# Patient Record
Sex: Female | Born: 1955 | Race: Black or African American
Health system: Southern US, Community
[De-identification: ages and names within clinical notes are randomized; demographics above are authoritative.]

## PROBLEM LIST (undated history)

## (undated) DIAGNOSIS — T148XXA Other injury of unspecified body region, initial encounter: Secondary | ICD-10-CM

## (undated) DIAGNOSIS — G4730 Sleep apnea, unspecified: Secondary | ICD-10-CM

## (undated) DIAGNOSIS — I879 Disorder of vein, unspecified: Secondary | ICD-10-CM

## (undated) DIAGNOSIS — G629 Polyneuropathy, unspecified: Secondary | ICD-10-CM

## (undated) DIAGNOSIS — K219 Gastro-esophageal reflux disease without esophagitis: Secondary | ICD-10-CM

## (undated) DIAGNOSIS — K759 Inflammatory liver disease, unspecified: Secondary | ICD-10-CM

## (undated) DIAGNOSIS — G5603 Carpal tunnel syndrome, bilateral upper limbs: Secondary | ICD-10-CM

## (undated) DIAGNOSIS — Z8619 Personal history of other infectious and parasitic diseases: Secondary | ICD-10-CM

## (undated) DIAGNOSIS — M545 Low back pain, unspecified: Secondary | ICD-10-CM

## (undated) DIAGNOSIS — C50912 Malignant neoplasm of unspecified site of left female breast: Secondary | ICD-10-CM

## (undated) DIAGNOSIS — R262 Difficulty in walking, not elsewhere classified: Secondary | ICD-10-CM

## (undated) DIAGNOSIS — I10 Essential (primary) hypertension: Secondary | ICD-10-CM

## (undated) DIAGNOSIS — H539 Unspecified visual disturbance: Secondary | ICD-10-CM

## (undated) DIAGNOSIS — M1990 Unspecified osteoarthritis, unspecified site: Secondary | ICD-10-CM

## (undated) DIAGNOSIS — J189 Pneumonia, unspecified organism: Secondary | ICD-10-CM

## (undated) DIAGNOSIS — F419 Anxiety disorder, unspecified: Secondary | ICD-10-CM

## (undated) DIAGNOSIS — J449 Chronic obstructive pulmonary disease, unspecified: Secondary | ICD-10-CM

## (undated) DIAGNOSIS — M542 Cervicalgia: Secondary | ICD-10-CM

## (undated) DIAGNOSIS — L309 Dermatitis, unspecified: Secondary | ICD-10-CM

## (undated) DIAGNOSIS — E785 Hyperlipidemia, unspecified: Secondary | ICD-10-CM

## (undated) HISTORY — DX: Sleep apnea, unspecified: G47.30

## (undated) HISTORY — DX: Low back pain, unspecified: M54.50

## (undated) HISTORY — DX: Chronic obstructive pulmonary disease, unspecified: J44.9

## (undated) HISTORY — DX: Inflammatory liver disease, unspecified: K75.9

## (undated) HISTORY — DX: Malignant neoplasm of unspecified site of left female breast: C50.912

## (undated) HISTORY — DX: Personal history of other infectious and parasitic diseases: Z86.19

## (undated) HISTORY — DX: Cervicalgia: M54.2

## (undated) HISTORY — PX: TONSILLECTOMY: SUR1361

## (undated) HISTORY — PX: INDUCED ABORTION: SHX677

## (undated) HISTORY — DX: Hyperlipidemia, unspecified: E78.5

---

## 1998-04-30 ENCOUNTER — Ambulatory Visit
Admit: 1998-04-30 | Disposition: A | Discharge status: Home / Self Care | Payer: Self-pay | Source: Ambulatory Visit | Admitting: Obstetrics & Gynecology

## 1998-06-11 ENCOUNTER — Emergency Department: Admit: 1998-06-11 | Payer: Self-pay | Admitting: Emergency Medicine

## 1999-03-30 ENCOUNTER — Emergency Department: Admit: 1999-03-30 | Payer: Self-pay | Admitting: Emergency Medicine

## 2001-04-25 ENCOUNTER — Emergency Department: Admit: 2001-04-25 | Payer: Self-pay | Source: Emergency Department | Admitting: Emergency Medicine

## 2005-09-18 HISTORY — PX: GANGLION CYST EXCISION: SHX1691

## 2008-10-21 ENCOUNTER — Emergency Department: Admit: 2008-10-21 | Payer: Self-pay | Source: Emergency Department | Admitting: Emergency Medicine

## 2009-07-26 ENCOUNTER — Emergency Department: Admit: 2009-07-26 | Payer: Self-pay | Source: Emergency Department | Admitting: Emergency Medicine

## 2011-12-07 ENCOUNTER — Emergency Department
Admission: EM | Admit: 2011-12-07 | Discharge: 2011-12-07 | Disposition: A | Discharge status: Home / Self Care | Payer: BLUE CROSS/BLUE SHIELD | Attending: Emergency Medicine | Admitting: Emergency Medicine

## 2011-12-07 ENCOUNTER — Emergency Department: Payer: BLUE CROSS/BLUE SHIELD

## 2011-12-07 ENCOUNTER — Emergency Department: Payer: BC Managed Care – HMO

## 2011-12-07 DIAGNOSIS — I10 Essential (primary) hypertension: Secondary | ICD-10-CM | POA: Insufficient documentation

## 2011-12-07 DIAGNOSIS — F17200 Nicotine dependence, unspecified, uncomplicated: Secondary | ICD-10-CM | POA: Insufficient documentation

## 2011-12-07 DIAGNOSIS — W19XXXA Unspecified fall, initial encounter: Secondary | ICD-10-CM | POA: Insufficient documentation

## 2011-12-07 DIAGNOSIS — IMO0002 Reserved for concepts with insufficient information to code with codable children: Secondary | ICD-10-CM | POA: Insufficient documentation

## 2011-12-07 HISTORY — DX: Essential (primary) hypertension: I10

## 2011-12-07 MED ORDER — HYDROCODONE-ACETAMINOPHEN 5-500 MG PO TABS
1.00 | ORAL_TABLET | ORAL | Status: DC | PRN
Start: 2011-12-07 — End: 2011-12-17

## 2011-12-07 MED ORDER — IBUPROFEN 600 MG PO TABS
600.00 mg | ORAL_TABLET | Freq: Once | ORAL | Status: AC
Start: 2011-12-07 — End: 2011-12-07
  Administered 2011-12-07: 600 mg via ORAL
  Filled 2011-12-07: qty 1

## 2011-12-07 NOTE — ED Notes (Signed)
56 y.o female pt with c/o right leg/calf pain that started immediately prior to arrival. PT was in a crisis injury prevention class and was standing sideways and with c/o "popping" sound with immediate pain to right calf.

## 2011-12-07 NOTE — ED Provider Notes (Signed)
History     Chief Complaint   Patient presents with   . Leg Injury     right     Patient is a 56 y.o. female presenting with leg pain. The history is provided by the patient.   Leg Pain   The incident occurred 1 to 2 hours ago. The incident occurred at work. 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 The pain is present in the right leg. The quality of the pain is described as sharp and throbbing. The pain is at a severity of 6/10. The pain has been fluctuating since onset. Associated symptoms include loss of motion. Pertinent negatives include no numbness, no muscle weakness, no loss of sensation and no tingling. The symptoms are aggravated by bearing weight and palpation. She has tried ice for the symptoms.       Past Medical History   Diagnosis Date   . Hypertensive disorder        History reviewed. No pertinent past surgical history.    History reviewed. No pertinent family history.    Current Facility-Administered Medications   Medication Dose Route Frequency Provider Last Rate Last Dose   . ibuprofen (ADVIL,MOTRIN) tablet 600 mg  600 mg Oral Once Rob Bunting, MD   600 mg at 12/07/11 1510     Current Outpatient Prescriptions   Medication Sig Dispense Refill   . Olmesartan-Amlodipine-HCTZ (TRIBENZOR) 40-5-12.5 MG TABS Take by mouth.       Marland Kitchen HYDROcodone-acetaminophen (VICODIN) 5-500 MG per tablet Take 1 tablet by mouth every 4 (four) hours as needed for Pain.  12 tablet  0       No Known Allergies    History   Substance Use Topics   . Smoking status: Current Everyday Smoker -- 0.5 packs/day   . Smokeless tobacco: Not on file   . Alcohol Use: No       Review of Systems   Neurological: Negative for tingling and numbness.   Constitutional: No fever or chills.  Eyes: no abnormality  ENT: No ear pain or sore throat.  Cardiovascular: No chest pain or palpitations.  Respiratory: No cough or shortness of breath.  GI: No vomiting or diarrhea.  Genitourinary:nl urine  output  Musculoskeletal:right calf  injury  Skin:No rash or skin lesions.  Neurologic:wnl  Psychiatric:      Physical Exam   BP 158/93  Pulse 124  Temp(Src) 98.4 F (36.9 C) (Oral)  Resp 20  Ht 1.626 m  Wt 104.327 kg  BMI 39.46 kg/m2  SpO2 100%    Physical Exam  Constitutional: Vital signs reviewed. Well appearing.  Head: Normocephalic, atraumatic  Eyes: No conjunctival injection. No discharge.EOMI  ENT: Mucous membranes moist,Ears and throat wnl  Neck: Normal range of motion. Non-tender.  Respiratory/Chest: Clear to auscultation. No respiratory distress.   Cardiovascular: Regular rate and rhythm. No murmur.   Abdomen: Soft and non-tender. No guarding. No masses or hepatosplenomegaly.  Genitourinary:   UpperExtremity:FROM,no abnormality noted  LowerExtremity: No edema. No cyanosis.decreased ROM TTP right posterior calf NVT CR intact  Neurological: No focal motor deficits by observation. Speech normal. Memory normal.  Skin: Warm and dry. No rash.  Lymphatic: No cervical lymphadenopathy.  Psychiatric: Normal affect. Normal concentration.  Back  No CVAT,no abnormalities,  Full range of motion  ED Course   Procedures    MDM           Rob Bunting, MD  12/07/11 1630

## 2011-12-07 NOTE — Discharge Instructions (Signed)
Muscle Strain, General    You have been diagnosed with a muscle strain.    Any muscle in the body can be strained. A strain is an injury to muscles where some of the muscle fibers are injured by being stretched or partially torn. This usually happens by overusing the muscle or performing an activity that the muscle is not used to doing.    Some of the symptoms of a strain include pain, muscle cramping, and soreness to the touch.    Often, the pain and stiffness in the muscle is worse the next day. This is much like what happens when a person begins exercising for the first time. After the exercise session, the person may feel pretty good, however the next day all of the exercised muscles feel stiff and sore.    The general treatment for a strain includes the following:   Resting the affected part.   Pain medication.   Muscle relaxant medications.   Warm compresses (such as a warm, moist towel).   Gentle stretching of the injured muscle.   And when tolerated, gentle massage of the affected area.    This injury is self-limited (it gets better on its own) and rarely requires specific treatment.    YOU SHOULD SEEK MEDICAL ATTENTION IMMEDIATELY, EITHER HERE OR AT THE NEAREST EMERGENCY DEPARTMENT, IF ANY OF THE FOLLOWING OCCURS:   Significant increase in swelling of the affected area.   Worsening pain instead of gradual improvement.   Redness of the skin over the affected area.   Inability to use the affected limb. Weakness or numbness of the limb.

## 2011-12-13 ENCOUNTER — Encounter (INDEPENDENT_AMBULATORY_CARE_PROVIDER_SITE_OTHER): Payer: Self-pay

## 2011-12-13 ENCOUNTER — Ambulatory Visit (INDEPENDENT_AMBULATORY_CARE_PROVIDER_SITE_OTHER): Payer: Worker's Comp, Other unspecified | Admitting: Nurse Practitioner

## 2011-12-13 VITALS — BP 106/82 | HR 102 | Temp 98.4°F | Resp 16 | Ht 60.0 in | Wt 180.0 lb

## 2011-12-13 DIAGNOSIS — S86919A Strain of unspecified muscle(s) and tendon(s) at lower leg level, unspecified leg, initial encounter: Secondary | ICD-10-CM

## 2011-12-13 DIAGNOSIS — IMO0002 Reserved for concepts with insufficient information to code with codable children: Secondary | ICD-10-CM
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
## 2011-12-13 NOTE — Patient Instructions (Signed)
MUSCLE STRAIN,EXTREMITY  A MUSCLE STRAIN is a stretching and tearing of muscle fibers. This causes pain, especially with motion of that muscle. There may also be some swelling and bruising.  HOME CARE:  1) Keep the injured area raised to reduce pain and swelling. This is especially important during the first 48 hours.  2) Make an ice pack (ice cubes in a plastic bag, wrapped in a towel) and apply for 20 minutes every 1-2 hours the first day. You should continue with ice packs 3-4 times a day for the second and third days. Unless otherwise instructed, on the fourth day you may begin hot soaks or hot packs (small towel soaked in hot water) 3-4 times a day while you gently exercise the involved area.  3) You may use acetaminophen (Tylenol) or ibuprofen (Motrin, Advil) to control pain, unless another medicine was prescribed. [ NOTE : If you have chronic liver or kidney disease or ever had a stomach ulcer or GI bleeding, talk with your doctor before using these medicines.]  4) For LEG STRAINS: If CRUTCHES have been recommended, do not bear full weight on the injured leg until you can do so without pain. You may return to sports when you are able to hop and run on the injured leg without pain.  FOLLOW UP with your doctor or this facility if you are not improving within the next five days.  GET PROMPT MEDICAL ATTENTION if any of the following occur:  -- Fingers or toes become swollen, cold, blue, numb or tingly  -- Pain or swelling increases   [address], [address], [place], [place] [address]. All rights reserved. This information is not intended as a substitute for professional medical care. Always follow your healthcare professional's instructions.

## 2012-04-09 ENCOUNTER — Emergency Department: Payer: Worker's Comp, Other unspecified

## 2012-04-09 ENCOUNTER — Emergency Department: Payer: BLUE CROSS/BLUE SHIELD

## 2012-04-09 ENCOUNTER — Emergency Department
Admission: EM | Admit: 2012-04-09 | Discharge: 2012-04-09 | Disposition: A | Discharge status: Home / Self Care | Payer: BLUE CROSS/BLUE SHIELD | Attending: Emergency Medicine | Admitting: Emergency Medicine

## 2012-04-09 DIAGNOSIS — S20219A Contusion of unspecified front wall of thorax, initial encounter: Secondary | ICD-10-CM | POA: Insufficient documentation

## 2012-04-09 DIAGNOSIS — I10 Essential (primary) hypertension: Secondary | ICD-10-CM | POA: Insufficient documentation

## 2012-04-09 MED ORDER — IBUPROFEN 600 MG PO TABS
600.00 mg | ORAL_TABLET | Freq: Four times a day (QID) | ORAL | Status: AC | PRN
Start: 2012-04-09 — End: 2012-04-19

## 2012-04-09 MED ORDER — OXYCODONE-ACETAMINOPHEN 5-325 MG PO TABS
ORAL_TABLET | ORAL | Status: AC
Start: 2012-04-09 — End: 2012-04-19

## 2012-04-09 MED ORDER — IBUPROFEN 600 MG PO TABS
600.00 mg | ORAL_TABLET | Freq: Once | ORAL | Status: AC
Start: 2012-04-09 — End: 2012-04-09
  Administered 2012-04-09: 600 mg via ORAL
  Filled 2012-04-09: qty 1

## 2012-04-09 MED ORDER — OXYCODONE-ACETAMINOPHEN 5-325 MG PO TABS
2.00 | ORAL_TABLET | Freq: Once | ORAL | Status: AC
Start: 2012-04-09 — End: 2012-04-09
  Administered 2012-04-09: 2 via ORAL

## 2012-04-09 NOTE — Discharge Instructions (Signed)
Chest Wall Contusion    You have been diagnosed with a chest wall contusion (bruise).    A chest wall contusion is a bruise to the muscles between the ribs. A contusion is another word for a bruise. This condition is painful because every breath moves the injured area. This condition is not dangerous by itself, but once in a while complications like pneumonia or a collapsed lung occur. Your pain should gradually decrease over this time.    Do not bind or tape your ribs. Although binding or taping them may decrease the pain, it also increases the chance you will develop pneumonia.    Cough and deep breathe at least 10 times an hour while you are awake. Supporting the injured area with a pillow or your hand decreases the pain. Use pain medications as prescribed to control the pain so you can breathe normally and do your coughing and deep-breathing exercises. Doing these exercises will help prevent pneumonia.    YOU SHOULD SEEK MEDICAL ATTENTION IMMEDIATELY, EITHER HERE OR AT THE NEAREST EMERGENCY DEPARTMENT, IF ANY OF THE FOLLOWING OCCURS:   Shortness of breath, such as difficulty breathing or wheezing.   Increasing pain not controlled by your pain medications.   Signs of pneumonia such as fever, cough (especially a cough that produces yellow-green mucus).   No improvement over the next few days.      Take motrin for pain, percocet for severe pain. You need to keep taking deep breaths to prevent a pneumonia. Follow up with your doctor. Return to ER for any worsening symptoms.
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
## 2012-04-09 NOTE — ED Notes (Signed)
Pt rates pain 6/10 at present.  Pt requested percocet prior to discharge.  Teaching done r/t dx, pain management, TCDB and follow up.
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
## 2012-04-19 ENCOUNTER — Emergency Department: Payer: Worker's Comp, Other unspecified

## 2012-04-19 ENCOUNTER — Emergency Department
Admission: EM | Admit: 2012-04-19 | Discharge: 2012-04-19 | Disposition: A | Discharge status: Home / Self Care | Payer: Worker's Comp, Other unspecified | Attending: Emergency Medicine | Admitting: Emergency Medicine

## 2012-04-19 DIAGNOSIS — I10 Essential (primary) hypertension: Secondary | ICD-10-CM | POA: Insufficient documentation

## 2012-04-19 DIAGNOSIS — F17200 Nicotine dependence, unspecified, uncomplicated: Secondary | ICD-10-CM | POA: Insufficient documentation

## 2012-04-19 DIAGNOSIS — J329 Chronic sinusitis, unspecified: Secondary | ICD-10-CM | POA: Insufficient documentation

## 2012-04-19 MED ORDER — IBUPROFEN 400 MG PO TABS
800.00 mg | ORAL_TABLET | Freq: Once | ORAL | Status: AC
Start: 2012-04-20 — End: 2012-04-19
  Administered 2012-04-19: 800 mg via ORAL
  Filled 2012-04-19: qty 2

## 2012-04-19 MED ORDER — AMOXICILLIN-POT CLAVULANATE 875-125 MG PO TABS
1.00 | ORAL_TABLET | Freq: Once | ORAL | Status: AC
Start: 2012-04-20 — End: 2012-04-19
  Administered 2012-04-19: 1 via ORAL
  Filled 2012-04-19: qty 1

## 2012-04-19 MED ORDER — IBUPROFEN 800 MG PO TABS
800.00 mg | ORAL_TABLET | Freq: Three times a day (TID) | ORAL | Status: AC | PRN
Start: 2012-04-19 — End: 2012-04-29

## 2012-04-19 MED ORDER — AMOXICILLIN-POT CLAVULANATE 875-125 MG PO TABS
1.00 | ORAL_TABLET | Freq: Two times a day (BID) | ORAL | Status: AC
Start: 2012-04-19 — End: 2012-04-26

## 2012-04-19 NOTE — Discharge Instructions (Signed)
Sinusitis    You have been seen for a sinus infection.    Sinus infections are common. They often happen after people have a virus or a common cold.    Symptoms are: Pain in the face, green or yellow mucous from the nose, fevers and chills. There may also be a feeling of fullness or pressure in the face.    Decongestants may help. This helps the sinuses drain. Sometimes a steroid spray is used.    You may need antibiotics for the infection. Not all cases of sinusitis need antibiotics. Viruses cause most sinusitis. Antibiotics do not work on viruses. Sometimes sinusitis and be caused by bacteria. These cases usually get better with medicine to help the sinuses drain. Antibiotics should be given only to patients who have symptoms for more than 2 weeks and if they have fevers, severe sinus pain and pus mixed in with the mucus.    YOU SHOULD SEEK MEDICAL ATTENTION IMMEDIATELY, EITHER HERE OR AT THE NEAREST EMERGENCY DEPARTMENT, IF ANY OF THE FOLLOWING OCCURS:   You do not get better with treatment.   You have severe headaches and high fevers or any confusion.   You have worse pain in the face. Your face gets more swollen.

## 2012-04-19 NOTE — ED Notes (Signed)
Pt arrived ambulatory with c/o sinus pressure, nasal congestion, cough and congestion.  Onset of symptoms one week ago.  Pt c/o pressure and pain with palpation of face.

## 2012-04-20 NOTE — ED Provider Notes (Signed)
Physician/Midlevel provider first contact with patient: 04/19/12 2309         History     Chief Complaint   Patient presents with   . Facial Pain   . Nasal Congestion   . Cough     HPI  Pt came to ed for having sinus pressure, nasal d/c, HA, cough and congestion x1 week.feels pressure in her sinuses.  No f/c, no sob, no chest apin  Past Medical History   Diagnosis Date   . Hypertensive disorder        History reviewed. No pertinent past surgical history.    Family History   Problem Relation Age of Onset   . Hypertension Mother    . Hypertension Father        Social  History   Substance Use Topics   . Smoking status: Current Everyday Smoker -- 0.5 packs/day for 30 years   . Smokeless tobacco: Not on file   . Alcohol Use: No       .     No Known Allergies    Current/Home Medications    IBUPROFEN (ADVIL,MOTRIN) 600 MG TABLET    Take 1 tablet (600 mg total) by mouth every 6 (six) hours as needed for Pain or Fever.    METOPROLOL (LOPRESSOR) 50 MG TABLET    Take 50 mg by mouth daily.    OLMESARTAN-AMLODIPINE-HCTZ (TRIBENZOR) 40-5-12.5 MG TABS    Take by mouth.    OXYCODONE-ACETAMINOPHEN (PERCOCET) 5-325 MG PER TABLET    1-2 tablets by mouth every 4-6 hours as needed for pain;  Do not drive or operate machinery while taking this medicine        Review of Systems    CONSTITUTIONAL: No weight loss, fever, chills, weakness or fatigue.   HEENT: Eyes: No visual loss, blurred vision, double vision or yellow sclerae. Ears, Nose, Throat: No hearing loss, sneezing, +congestion,+ runny nose no sore throat.   SKIN: No rash or itching.   NEUROLOGICAL: No headache, dizziness, syncope, paralysis, ataxia, numbness or tingling in the extremities. No change in bowel or bladder control.   MUSCULOSKELETAL: No muscle, back pain, joint pain or stiffness.   HEMATOLOGIC: No anemia, bleeding or bruising.   LYMPHATICS: No enlarged nodes. No history of splenectomy.   PSYCHIATRIC: No history of depression or anxiety.   ALLERGIES: No history of  asthma, hives, eczema or rhinitis  All other related systems reviewed and are otherwise negative      Physical Exam    BP 160/90  Pulse 67  Temp(Src) 98.9 F (37.2 C) (Oral)  Resp 20  Ht 1.524 m  Wt 81.647 kg  BMI 35.15 kg/m2  SpO2 96%    Physical Exam  GENERAL: Well developed, well nourished, alert and cooperative, and appears to be in no acute distress.  HEAD: normocephalic. Atraumatic  EENT: PERRL, EOMI. Vision is grossly intact. External auditory canals and tympanic membranes clear, hearing grossly intact. No nasal discharge. Oral cavity and pharynx normal. No inflammation, swelling, exudate, or lesions. + maxillary and frontal sinuses ttp  NECK: Neck supple, non-tender without lymphadenopathy, masses or thyromegaly.   rebound.  No masses.  UPPER EXTREMITIES: Normal ROM in all extremities. No joint erythema or tenderness. Normal gait. No cyanosis. No significant deformity or joint abnormality. No edema.   LOWER EXTREMITY: Examination of both feet reveals all toes to be normal in size and symmetry, normal range of motion, normal sensation with distal capillary filling of less than 2 seconds without tenderness,  swelling, discoloration, nodules, weakness or deformity; examination of both ankles, knees, legs, and hips reveals normal range of motion, normal sensation without tenderness, swelling, discoloration, crepitus, weakness or deformity.  BACK: No midline tenderness  NEUROLOGICAL: CN II-XII intact. Strength and sensation symmetric and intact throughout. Reflexes 2+ throughout. GCS 15  SKIN: Skin normal color, texture and turgor with no lesions or eruptions.  PSYCHIATRIC: The mental examination revealed the patient was oriented to person, place, and time.   MDM and ED Course     ED Medication Orders      Start     Status Ordering Provider    04/20/12 0000   amoxicillin-clavulanate (AUGMENTIN) 875-125 MG per tablet 1 tablet   Once      Route: Oral  Ordered Dose: 1 tablet         Last MAR action:  Given  Adryana Mogensen AHMADIAN    04/20/12 0000   ibuprofen (ADVIL,MOTRIN) tablet 800 mg   Once      Route: Oral  Ordered Dose: 800 mg         Last MAR action:  Given Marybelle Killings                 MDM    Medical Decision Making      Presumptive Diagnosis:   1. Sinusitis          Treatment Plan: home, see patient instructions for treatment and plan    I reviewed the vital signs, nursing notes, past medical history, past surgical history, family history and social history.  Marybelle Killings, MD    Vital Signs - BP 160/90  Pulse 67  Temp(Src) 98.9 F (37.2 C) (Oral)  Resp 20  Ht 1.524 m  Wt 81.647 kg  BMI 35.15 kg/m2  SpO2 96%    Pulse Oximetry Analysis -  Normal    Differential Diagnosis (not completely inclusive): URI    Laboratory results reviewed by EDP: Yes  R    Results     ** No Results found for the last 24 hours. **          Radiology Results (24 Hour)     ** No Results found for the last 24 hours. **            VS was reviewed prior to dispo  Pt was re-evaluated prior to dispo      Results of work up was discussed with the Pt. Pt was given instruction to have follow up with PMD/Specialist or Return to ED for any worsening. Understood and agreed.      Vital Signs: Reviewed the patient's vital signs.       Nursing Notes: Reviewed and utilized available nursing notes.      Medical Records Reviewed: Reviewed available past medical records.    Procedures    Clinical Impression & Disposition     Clinical Impression  Final diagnoses:   Sinusitis        ED Disposition     Discharge Beyonce Sawatzky Knittle discharge to home/self care.    Condition at discharge: Stable             New Prescriptions    AMOXICILLIN-CLAVULANATE (AUGMENTIN) 875-125 MG PER TABLET    Take 1 tablet by mouth 2 (two) times daily.    IBUPROFEN (ADVIL,MOTRIN) 800 MG TABLET    Take 1 tablet (800 mg total) by mouth every 8 (eight) hours as needed for Pain or Fever.  Marybelle Killings, MD  04/20/12 [phone]

## 2012-05-11 ENCOUNTER — Emergency Department
Admission: EM | Admit: 2012-05-11 | Discharge: 2012-05-11 | Disposition: A | Discharge status: Home / Self Care | Payer: Worker's Comp, Other unspecified | Attending: Emergency Medicine | Admitting: Emergency Medicine

## 2012-05-11 ENCOUNTER — Emergency Department: Payer: Worker's Comp, Other unspecified

## 2012-05-11 DIAGNOSIS — F17200 Nicotine dependence, unspecified, uncomplicated: Secondary | ICD-10-CM | POA: Insufficient documentation

## 2012-05-11 DIAGNOSIS — L259 Unspecified contact dermatitis, unspecified cause: Secondary | ICD-10-CM | POA: Insufficient documentation

## 2012-05-11 MED ORDER — DOXYCYCLINE HYCLATE 100 MG PO TABS
100.00 mg | ORAL_TABLET | Freq: Two times a day (BID) | ORAL | Status: AC
Start: 2012-05-11 — End: 2012-05-25

## 2012-05-11 MED ORDER — PREDNISONE 20 MG PO TABS
60.00 mg | ORAL_TABLET | Freq: Once | ORAL | Status: AC
Start: 2012-05-11 — End: 2012-05-11
  Administered 2012-05-11: 60 mg via ORAL
  Filled 2012-05-11: qty 3

## 2012-05-11 MED ORDER — PREDNISONE 20 MG PO TABS
40.00 mg | ORAL_TABLET | Freq: Every day | ORAL | Status: AC
Start: 2012-05-11 — End: 2012-05-16

## 2012-05-11 MED ORDER — DOXYCYCLINE HYCLATE 100 MG PO TABS
100.00 mg | ORAL_TABLET | Freq: Once | ORAL | Status: AC
Start: 2012-05-11 — End: 2012-05-11
  Administered 2012-05-11: 100 mg via ORAL
  Filled 2012-05-11: qty 1

## 2012-05-11 NOTE — ED Provider Notes (Signed)
None        History     Chief Complaint   Patient presents with   . Rash     HPI Comments: 56 yo pw pruritic rash left arm x 2 days.  + exposure to weeds earlier in week.  No relief with topical steroids.    Patient is a 56 y.o. female presenting with rash. The history is provided by the patient.   Rash   This is a new problem. The current episode started 2 days ago. The problem has not changed since onset.      Past Medical History   Diagnosis Date   . Hypertensive disorder        History reviewed. No pertinent past surgical history.    Family History   Problem Relation Age of Onset   . Hypertension Mother    . Hypertension Father        Social  History   Substance Use Topics   . Smoking status: Current Everyday Smoker -- 0.5 packs/day for 30 years   . Smokeless tobacco: Not on file   . Alcohol Use: No       .     No Known Allergies    Current/Home Medications    METOPROLOL (LOPRESSOR) 50 MG TABLET    Take 50 mg by mouth daily.    OLMESARTAN-AMLODIPINE-HCTZ (TRIBENZOR) 40-5-12.5 MG TABS    Take by mouth.        Review of Systems   Skin: Positive for rash.   All other systems reviewed and are negative.        Physical Exam    BP 157/91  Pulse 86  Temp(Src) 98.6 F (37 C) (Oral)  Resp 18  Ht 1.524 m  Wt 81.647 kg  BMI 35.15 kg/m2  SpO2 100%    Physical Exam   Constitutional: She is oriented to person, place, and time. She appears well-developed and well-nourished.   HENT:   Head: Normocephalic and atraumatic.   Eyes: Conjunctivae normal and EOM are normal. Pupils are equal, round, and reactive to light.   Neurological: She is alert and oriented to person, place, and time.   Skin: Skin is warm and dry.        Right upper ext multiple erythematous lesions with minimal crusting and small caliber vessicles   Psychiatric: She has a normal mood and affect. Thought content normal.       MDM and ED Course     ED Medication Orders     None           MDM      Procedures    Clinical Impression & Disposition      Clinical Impression  Final diagnoses:   None        ED Disposition     None           New Prescriptions    No medications on file               Samuel Bouche, MD  05/11/12 [phone]

## 2012-05-11 NOTE — Discharge Instructions (Signed)
Contact Dermatitis    You have been diagnosed with contact dermatitis.    Contact dermatitis is an irritation of the skin caused by contact with something. Sometimes the rash is due to an allergic reaction and sometimes the rash is due to simple chemical irritation. The symptoms include itching and redness of the skin, along with blistering in more severe cases. Although this condition is rarely dangerous, it can be very frustrating.    Examples of items which can cause contact dermatitis include detergents, soaps, shampoos, nickel (found in jewelry), poison ivy/oak, wool, clothing starch, cleaning fluids, etc.    In many cases, the item that caused the rash cannot be identified. If you have many episodes of contact dermatitis, you should see a dermatologist (skin doctor) or allergist to undergo testing to determine the cause of your reaction. If you are fortunate enough to know what caused your rash, you will be able to avoid that substance in the future.    The general treatment for this condition includes:   Use of antihistamines (anti-itch medicines). Antihistamines such as over-the-counter diphenhydramine (Benadryl: 1-2 (25 mg) tablets every 6 hours) or prescription strength hydroxyzine (Atarax) can be used to help limit your itching. Because antihistamines can make you drowsy, use caution and avoid them if you must be alert (ie: On the job or when driving).   Use of a steroid (cortisone) cream. Steroid creams should be applied sparingly (in small amounts) 2 times each day to the affected areas. These creams can be used until the skin irritation is gone and the skin feels normal to the touch. Steroid creams should be applied to clean, damp skin and then covered with a thick layer of moisturizer. As a general rule, steroid cream should not be used on the face for longer than 7 days or on other areas of the body for longer than 14 days, unless your doctor tells you otherwise.   Use of moisturizers. Skin  moisturizers should be applied many times per day- 5 times or more is reasonable. A good moisturizer should "seal in" water and moisture to the skin. They should be used directly after (on top of) steroid creams, and then additionally (alone) many more times to keep the skin from looking and feeling dry. You should use greasy, stiff moisturizes that are too thick to pour. Eucerin cream (not lotion) is an example of a good over-the-counter brand. Vaseline and even Crisco are good examples of less expensive but good skin moisturizers.    YOU SHOULD SEEK MEDICAL ATTENTION IMMEDIATELY, EITHER HERE OR AT THE NEAREST EMERGENCY DEPARTMENT, IF ANY OF THE FOLLOWING OCCURS:   The symptoms become worse even with treatment.   You develop signs of infection at the affected areas (redness, pus, pain, swelling or fever).

## 2012-05-11 NOTE — ED Notes (Signed)
Patient with complaint of rash to arms.

## 2013-01-08 ENCOUNTER — Emergency Department: Payer: No Typology Code available for payment source

## 2013-01-08 ENCOUNTER — Emergency Department
Admission: EM | Admit: 2013-01-08 | Discharge: 2013-01-08 | Disposition: A | Discharge status: Home / Self Care | Payer: No Typology Code available for payment source | Attending: Emergency Medicine | Admitting: Emergency Medicine

## 2013-01-08 DIAGNOSIS — S42402A Unspecified fracture of lower end of left humerus, initial encounter for closed fracture: Secondary | ICD-10-CM

## 2013-01-08 DIAGNOSIS — M545 Low back pain, unspecified: Secondary | ICD-10-CM | POA: Insufficient documentation

## 2013-01-08 DIAGNOSIS — I10 Essential (primary) hypertension: Secondary | ICD-10-CM | POA: Insufficient documentation

## 2013-01-08 DIAGNOSIS — M542 Cervicalgia: Secondary | ICD-10-CM | POA: Insufficient documentation

## 2013-01-08 DIAGNOSIS — F17200 Nicotine dependence, unspecified, uncomplicated: Secondary | ICD-10-CM | POA: Insufficient documentation

## 2013-01-08 DIAGNOSIS — R51 Headache: Secondary | ICD-10-CM | POA: Insufficient documentation

## 2013-01-08 DIAGNOSIS — S42409A Unspecified fracture of lower end of unspecified humerus, initial encounter for closed fracture: Secondary | ICD-10-CM | POA: Insufficient documentation

## 2013-01-08 DIAGNOSIS — M25559 Pain in unspecified hip: Secondary | ICD-10-CM | POA: Insufficient documentation

## 2013-01-08 LAB — CBC AND DIFFERENTIAL
Basophils Absolute Automated: 0.03 (ref 0.00–0.20)
Basophils Automated: 0 %
Eosinophils Absolute Automated: 0.26 (ref 0.00–0.70)
Eosinophils Automated: 3 %
Hematocrit: 45.3 % (ref 37.0–47.0)
Hgb: 14.9 g/dL (ref 12.0–16.0)
Lymphocytes Absolute Automated: 3.88 (ref 0.50–4.40)
Lymphocytes Automated: 47 %
MCH: 29.2 pg (ref 28.0–32.0)
MCHC: 32.9 g/dL (ref 32.0–36.0)
MCV: 88.8 fL (ref 80.0–100.0)
MPV: 11.1 fL (ref 9.4–12.3)
Monocytes Absolute Automated: 0.63 (ref 0.00–1.20)
Monocytes: 8 %
Neutrophils Absolute: 3.53 (ref 1.80–8.10)
Neutrophils: 42 %
Platelets: 270 (ref 140–400)
RBC: 5.1 (ref 4.20–5.40)
RDW: 14 % (ref 12–15)
WBC: 8.33 (ref 3.50–10.80)

## 2013-01-08 LAB — COMPREHENSIVE METABOLIC PANEL
ALT: 34 U/L (ref 0–55)
AST (SGOT): 31 U/L (ref 5–34)
Albumin/Globulin Ratio: 1 (ref 0.9–2.2)
Albumin: 4.2 g/dL (ref 3.5–5.0)
Alkaline Phosphatase: 102 U/L (ref 40–150)
Anion Gap: 12 (ref 5.0–15.0)
BUN: 8.8 mg/dL (ref 7.0–19.0)
Bilirubin, Total: 0.3 mg/dL (ref 0.2–1.2)
CO2: 26 (ref 22–29)
Calcium: 10.5 mg/dL (ref 8.5–10.5)
Chloride: 102 (ref 98–107)
Creatinine: 0.9 mg/dL (ref 0.6–1.0)
Globulin: 4.1 g/dL — ABNORMAL HIGH (ref 2.0–3.6)
Glucose: 112 mg/dL — ABNORMAL HIGH (ref 70–100)
Potassium: 3.7 (ref 3.5–5.1)
Protein, Total: 8.3 g/dL (ref 6.0–8.3)
Sodium: 140 (ref 136–145)

## 2013-01-08 LAB — GFR: EGFR: >60

## 2013-01-08 MED ORDER — IBUPROFEN 600 MG PO TABS
600.0000 mg | ORAL_TABLET | Freq: Once | ORAL | Status: AC
Start: 2013-01-08 — End: 2013-01-08
  Administered 2013-01-08: 600 mg via ORAL
  Filled 2013-01-08: qty 1

## 2013-01-08 MED ORDER — OXYCODONE-ACETAMINOPHEN 5-325 MG PO TABS
ORAL_TABLET | ORAL | Status: DC
Start: 2013-01-08 — End: 2013-06-02

## 2013-01-08 MED ORDER — OXYCODONE-ACETAMINOPHEN 5-325 MG PO TABS
2.0000 | ORAL_TABLET | Freq: Once | ORAL | Status: AC
Start: 2013-01-08 — End: 2013-01-08
  Administered 2013-01-08: 2 via ORAL
  Filled 2013-01-08: qty 2
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
## 2013-01-08 NOTE — ED Notes (Signed)
Er tech attempted IV x 2 unsuccessful, labs collected and sent.  #20g SL started in right FA, good blood return.  Flushed with 10 ml's of NS.  Ct tech to bedside to transport pt to Ct.  IV site flushed by Ct tech noted swelling at site.  Ct tech stated It can be a #22g.  Attempted, unsuccessful to right hand.

## 2013-01-08 NOTE — ED Notes (Signed)
Dr. Warner Mccreedy made aware pt has no IV access.  MD spoke with Ct tech new order will be entered.

## 2013-01-08 NOTE — Discharge Instructions (Signed)
Tylenol for pain  Sling, Ice  Follow up with ortho call for appointment  Return to the ED if symptoms are worse
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
## 2013-01-08 NOTE — ED Notes (Signed)
At bedside to attempt IV start.  Pt stated I am very difficult.  I have little veins.  Supportive care given.

## 2013-01-21 ENCOUNTER — Ambulatory Visit (INDEPENDENT_AMBULATORY_CARE_PROVIDER_SITE_OTHER): Payer: No Typology Code available for payment source | Admitting: Sports Medicine

## 2013-01-21 ENCOUNTER — Encounter (INDEPENDENT_AMBULATORY_CARE_PROVIDER_SITE_OTHER): Payer: Self-pay | Admitting: Sports Medicine

## 2013-01-21 VITALS — BP 140/90 | HR 87 | Ht 60.0 in | Wt 182.0 lb

## 2013-01-21 DIAGNOSIS — S52123A Displaced fracture of head of unspecified radius, initial encounter for closed fracture: Secondary | ICD-10-CM

## 2013-01-21 DIAGNOSIS — S52122A Displaced fracture of head of left radius, initial encounter for closed fracture: Secondary | ICD-10-CM

## 2013-01-21 MED ORDER — TRAMADOL HCL 50 MG PO TABS
50.00 mg | ORAL_TABLET | Freq: Four times a day (QID) | ORAL | Status: DC | PRN
Start: 2013-01-21 — End: 2013-06-02
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
## 2013-02-20 ENCOUNTER — Ambulatory Visit (INDEPENDENT_AMBULATORY_CARE_PROVIDER_SITE_OTHER): Payer: Self-pay | Admitting: Sports Medicine

## 2013-06-02 ENCOUNTER — Emergency Department: Payer: Self-pay

## 2013-06-02 ENCOUNTER — Emergency Department
Admission: EM | Admit: 2013-06-02 | Discharge: 2013-06-02 | Disposition: A | Discharge status: Home / Self Care | Payer: Self-pay | Attending: Emergency Medicine | Admitting: Emergency Medicine

## 2013-06-02 DIAGNOSIS — S39012A Strain of muscle, fascia and tendon of lower back, initial encounter: Secondary | ICD-10-CM

## 2013-06-02 DIAGNOSIS — S335XXA Sprain of ligaments of lumbar spine, initial encounter: Secondary | ICD-10-CM | POA: Insufficient documentation

## 2013-06-02 DIAGNOSIS — R319 Hematuria, unspecified: Secondary | ICD-10-CM | POA: Insufficient documentation

## 2013-06-02 DIAGNOSIS — I10 Essential (primary) hypertension: Secondary | ICD-10-CM | POA: Insufficient documentation

## 2013-06-02 DIAGNOSIS — F17200 Nicotine dependence, unspecified, uncomplicated: Secondary | ICD-10-CM | POA: Insufficient documentation

## 2013-06-02 LAB — URINALYSIS
Bilirubin, UA: NEGATIVE
Glucose, UA: NEGATIVE
Leukocyte Esterase, UA: NEGATIVE
Nitrite, UA: NEGATIVE
Protein, UR: NEGATIVE
Specific Gravity UA: 1.025 (ref 1.001–1.035)
Urine pH: 6 (ref 5.0–8.0)
Urobilinogen, UA: 0.2 mg/dL

## 2013-06-02 LAB — BASIC METABOLIC PANEL
Anion Gap: 12 (ref 5.0–15.0)
BUN: 12.3 mg/dL (ref 7.0–19.0)
CO2: 21 — ABNORMAL LOW (ref 22–29)
Calcium: 9.8 mg/dL (ref 8.5–10.5)
Chloride: 108 — ABNORMAL HIGH (ref 98–107)
Creatinine: 0.7 mg/dL (ref 0.6–1.0)
Glucose: 99 mg/dL (ref 70–100)
Potassium: 4.2 (ref 3.5–5.1)
Sodium: 141 (ref 136–145)

## 2013-06-02 LAB — URINE MICROSCOPIC

## 2013-06-02 LAB — GFR: EGFR: >60

## 2013-06-02 MED ORDER — KETOROLAC TROMETHAMINE 30 MG/ML IJ SOLN
60.0000 mg | Freq: Once | INTRAMUSCULAR | Status: AC
Start: 2013-06-02 — End: 2013-06-02
  Administered 2013-06-02: 60 mg via INTRAMUSCULAR
  Filled 2013-06-02: qty 1

## 2013-06-02 MED ORDER — KETOROLAC TROMETHAMINE 30 MG/ML IJ SOLN
30.00 mg | Freq: Once | INTRAMUSCULAR | Status: DC
Start: 2013-06-02 — End: 2013-06-02
  Filled 2013-06-02: qty 1

## 2013-06-02 MED ORDER — IBUPROFEN 600 MG PO TABS
600.0000 mg | ORAL_TABLET | Freq: Four times a day (QID) | ORAL | Status: DC | PRN
Start: 2013-06-02 — End: 2014-06-30

## 2013-06-02 MED ORDER — SODIUM CHLORIDE 0.9 % IV BOLUS
1000.0000 mL | Freq: Once | INTRAVENOUS | Status: DC
Start: 2013-06-02 — End: 2013-06-02

## 2013-06-02 NOTE — ED Provider Notes (Signed)
Physician/Midlevel provider first contact with patient: 06/02/13 1958         EMERGENCY DEPARTMENT HISTORY AND PHYSICAL EXAM    Date: 06/02/2013    Patient Name: [name],[name]        Clinical Impression:   1. Lumbar strain, initial encounter    2. Hematuria      Disposition:    ED Disposition     Discharge Phynix Horton Lamb discharge to home/self care.    Condition at disposition: Stable                History of Presenting Illness     Chief Complaint   Patient presents with   . Chief Complaint   Patient presents with   . Back Pain          History obtained from: patient    57 y.o. female presents with constant bilateral lower back pain onset 2 days ago that has progressively worsened and today is radiating to her abd around the L side. Pain worse with sitting down and changing positions. Also reports urinary frequency for 1 week but notes she has been drinking a lot of water. Dad has h/o kidney stones. No recent trauma or injury. Pt denies dysuria, hematuria, fevers, paresthesias, B/B incontinence, and any other symptoms or concerns. 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        PCP: Dyke Maes, [name]      Current outpatient prescriptions:  Current/Home Medications    AMLODIPINE BESYLATE PO    Take by mouth.    LISINOPRIL PO    Take by mouth.           Past Medical History        Past Medical History   Diagnosis Date   . Hypertensive disorder          History reviewed. No pertinent past surgical history.      Family History       Family History   Problem Relation Age of Onset   . Hypertension Mother    . Hypertension Father          Social History     History     Social History   . Marital Status: Single     Spouse Name: Mueller/A     Number of Children: Mueller/A   . Years of Education: Mueller/A     Occupational History   . Not on file.     Social History Main Topics   . Smoking status: Current Every Day Smoker -- 0.5 packs/day for 30 years     Types: Cigarettes   . Smokeless tobacco: Not on file   . Alcohol Use: No   .  Drug Use: No   . Sexually Active:      Other Topics Concern   . Not on file     Social History Narrative   . No narrative on file         Allergies     No Known Allergies      Review of Systems     Positive and negative ROS elements as per HPI.  All Other Systems Reviewed and Negative: Yes    Physical Exam     Nursing Notes Reviewed.  Vital signs reviewed    BP 153/88  Pulse 97  Temp 99.1 F (37.3 C)  Resp 16  Ht 1.524 m  Wt 82.555 kg  BMI 35.54 kg/m2  SpO2  99%    Constitutional:  Well appearing.  NAD.  Head: Normocephalic, atraumatic  Eyes: Eyes normal appearing.  EOMI. No conjunctival injection. No discharge.  ENT: Mucous membranes moist, mouth normal  Neck: Normal range of motion. supple  Respiratory/Chest: Clear to auscultation. No respiratory distress.   Cardiovascular: Regular rate and rhythm. No murmur.   Abdomen: Soft.No guarding. No peritoneal signs. Left lower lateral abd TTP  Back: Bilateral lower lumbar tenderness. No CVA or midline tenderness. No stepoffs.  UpperExtremity: Normal ROM, no edema.  LowerExtremity: Normal ROM. No edema. Normal dorsiflexion and plantarflexion. Sensation intact in 1st webspace bilaterally. No saddle anesthesia.  Neurological: Speech normal. Memory normal.  GCS 15.    Skin: Warm and dry. No rash.  Psychiatric: Normal affect. Normal concentration.          Clinical Course in the Emergency Department       8:04 PM  Assessment & Plan:  57 y.o. female presents with back pain    Differential: UTI/pyelo, renal stone, diverticulitis, colitis, muscle strain    Plan:  UA  CT abd/pelvis  Analgesic  IVF   Reassess and dispo after treatment/results      10:37 PM  CT neg for renal stone  UA neg for infection.  +hematuria, pt aware - present for months, needs OP f/u  Renal fxn normal  Suspect lumbar strain  Recommend heat, advil, rest, stretch, OP f/u      Medical Decision Making           Amount and/or Complexity of Data Reviewed   First [name]: Kathryn Faster. Kathi Ludwig, [name]   I am the first  provider for this patient.      The vital signs, past medical/surgical history, social history, family history, previous records, and medication list were reviewed by the ED attending (myself).    Clinical lab tests ordered and reviewed by myself: yes   All lab results interpreted by myself: yes   All radiology ordered and reviewed by myself: yes   All radiologic studies interpreted  by myself: yes   Independent visualization of images, tracings, or specimens: yes   Discuss the patient with other providers: yes   Decide to obtain previous medical records or to obtain history from someone other than the patient: yes   Review and summarize past medical records: yes   Nursing notes reviewed: yes     The vital signs and pulse ox were reviewed.  BP 153/88  Pulse 97  Temp 99.1 F (37.3 C)  Resp 16  Ht 1.524 m  Wt 82.555 kg  BMI 35.54 kg/m2  SpO2 99%  Normal oxygen sat with no intervention needed.      Labs/Imaging results       Results     Procedure Component Value Units Date/Time    Basic Metabolic Panel (BMP) [[id number]]  (Abnormal) Collected:06/02/13 2030    Specimen Information:Blood Updated:06/02/13 2102     Glucose 99 mg/dL      BUN 29.5 mg/dL      Creatinine 0.7 mg/dL      CALCIUM 9.8 mg/dL      Sodium 284      Potassium 4.2      Chloride 108 (H)      CO2 21 (L)      Anion Gap 12.0     GFR [[id number]] Collected:06/02/13 2030     EGFR >60.0 Updated:06/02/13 2102    LAB-Urinalysis [[id number]]  (Abnormal) Collected:06/02/13 2043    Specimen Information:Urine Updated:06/02/13 2054  Urine Type Clean Catch      Color, UA Yellow      Clarity, UA Sl Cloudy (A)      Specific Gravity UA 1.025      Urine pH 6.0      Leukocyte Esterase, UA Negative      Nitrite, UA Negative      Protein, UA Negative      Glucose, UA Negative      Ketones UA Trace (A)      Urobilinogen, UA 0.2 mg/dL      Bilirubin, UA Negative      Blood, UA Moderate (A)     Microscopic, Urine [[id number]]  (Abnormal) Collected:06/02/13 2043     RBC,  UA 11 - 25 (A) Updated:06/02/13 2054     WBC, UA 0 - 5      Squamous Epithelial Cells, Urine 6 - 10      Urine Bacteria Rare           Radiology Results (24 Hour)     Procedure Component Value Units Date/Time    CT Abd/Pelvis without Contrast [[id number]] Collected:06/02/13 2207    Order Status:Completed  Updated:06/02/13 2221    Narrative:      History: Left flank pain.    Technique: Spiral CT imaging was performed through abdomen and pelvis  according to ureter CT protocol without oral or intravenous contrast.    Comparison: 01/08/2013.    No intrarenal or collecting system calcifications. No hydronephrosis or  hydroureter.  Bladder is grossly normal. No contour deforming renal  lesions, perinephric fluid or stranding seen. Bowel is nondistended.  Mild colonic diverticulosis noted without surrounding inflammation to  indicate acute diverticulitis. Normal appendix is seen at the right  lower quadrant.    Visualized portions of unenhanced liver, spleen, pancreas, and adrenals  are grossly normal, evaluation of solid organs limited without  intravenous contrast. No adenopathy or fluid collection within abdomen  or pelvis.    There are fat-containing inguinal hernias. No herniating bowel. There  are appearance is stable compared to the previous examination.    Linear air identified within the left buttock. Is the patient post left  buttock subcutaneous injection.      Impression:    Impression:  1. No obstructive uropathy or nephrolithiasis.  2. No localizing inflammatory change noted to explain patient's pain. No  change compared to 01/08/2013.    Kathryn Rakes, [name]   06/02/2013 10:17 PM                  ________________________________________________________________________    Attestations    I was acting as a scribe for Kathryn Petty, [name] on [name]  Treatment Team: Scribe: [name]    I am the first provider for this patient and I personally performed the services documented.  Treatment Team: Scribe:  [name] is scribing for me on Cupples,[name]. This note accurately reflects work and decisions made by me.  Kathryn Petty, [name]            .       Kathryn Pastel, [name]  06/03/13 [phone]

## 2013-06-02 NOTE — ED Notes (Signed)
C/O lower back pain, onset 2 days ago, denies urinary symptoms, no known injury, arrives ambulatory

## 2013-06-02 NOTE — ED Notes (Signed)
IV access not obtained after 2 attempts.

## 2013-06-02 NOTE — ED Notes (Signed)
Unable to place saline lock.  MD aware.  New orders rec'd for PO fluids.

## 2013-06-02 NOTE — ED Notes (Signed)
Family at bedside. 

## 2013-06-02 NOTE — Discharge Instructions (Signed)
Your cat scan was normal with no signs of kidney stone.  We suspect your back pain is most likely from strained muscles in your back.  For this - take Motrin as prescribed, and use a heating pad to sore areas.  Rest and drink lots of water. Do not do any heavy lifting for 1 week.  Stretch your back muscles daily.    Your urine test showed no signs of infection, but there was some blood seen in your urine.  Your lab tests showed that your kidneys are functioning normally.  We recommend you follow up with your primary care doctor in 2 days for a check up and repeat urine test.  If you continue to have blood in your urine, you may need further testing performed.  Please bring the copy of your test results that we provided with you to see your doctor.  Return to the ER for any worsening of symptoms or other concerns.    You were seen in the Emergency Department by Dr. Kathi Ludwig, thank you for choosing our hospital for your care.        Back Pain, Lumbar     You have been seen for low back pain.     This area is also called the lumbar spine.    Pain in the low back is very a common problem. This pain is usually due to overuse of the muscles, tension or a strain of the muscles. There can also be damage to the discs that cushion the bones in the spine. This can cause irritation of the nerves that exit the spinal canal in the low back.    Your doctor did not find any pain over the bones in your back (even though you might have pain in the back muscles). This means it is very unlikely that you have a broken bone in your back. Your doctor did not think it was necessary to take an x-ray.    The doctor still does not know the exact cause of your pain. Your problem does not seem to be from a dangerous cause. It is safe for you to go home today.    Some things you can try to help your back feel better are:   Apply a warm damp washcloth to the back for 20 minutes at a time, at least 4 times per day. This will reduce your pain.  Massaging your back might also help.   Have someone massage the sore parts of your back.   Roe Coombs t do any heavy lifting or bending. You can go back to normal daily activities if they don t make the pain worse.   - Use an over-the-counter anti-inflammatory medication Ibuprofen (also known as Advil or Motrin) as directed on the package to help with pain and inflammation.     It is normal for the pain may last for the next few days. If your pain gets better, you probably do not need to see a doctor. However, if your symptoms get worse or you have new symptoms, you should return here or go to the nearest Emergency Department.    Call your physician or go to the nearest Emergency Department if you your pain does not improve or your pain is bad enough to seriously limit your normal activities.    YOU SHOULD SEEK MEDICAL ATTENTION IMMEDIATELY, EITHER HERE OR AT THE NEAREST EMERGENCY DEPARTMENT, IF ANY OF THE FOLLOWING OCCURS:   You think the pain is coming from somewhere  other than your back. This can include pelvic pain. This can be from infections in the pelvis or lower belly.   You have abdominal (belly) pain that goes through to your back.   Your legs tingle or get numb (lose feeling).   Your legs are weak.   You have fevers (temperature of more than 100.55F or 38C) along with back pain.   Your back pain is getting worse.   You lose control of your bladder or bowels. If this were to happen, it may cause you to wet or soil yourself.    You have problems urinating (peeing).      Hematuria    You have been diagnosed with hematuria.    Hematuria is the medical term for having blood in your urine. There are many causes of this condition. Some are serious, others are not. Injury, infection, prostate enlargement and kidney stones are the most common causes.    The main symptom of hematuria is red or dark-colored urine. Occasionally blood clots may block the opening of the bladder, making urination  difficult.    Because some of the less common causes of hematuria can be very serious, it is important that you follow up with your regular doctor or a Urologist to make certain that your hematuria is not due to a serious cause.    YOU SHOULD SEEK MEDICAL ATTENTION IMMEDIATELY, EITHER HERE OR AT THE NEAREST EMERGENCY DEPARTMENT, IF ANY OF THE FOLLOWING OCCURS:   Inability to urinate or empty your bladder.   Weakness or lightheadedness at any time.   Any worsening symptoms, new symptoms, or any other concerns.

## 2013-09-14 ENCOUNTER — Emergency Department: Payer: No Typology Code available for payment source

## 2013-09-14 ENCOUNTER — Emergency Department
Admission: EM | Admit: 2013-09-14 | Discharge: 2013-09-14 | Disposition: A | Discharge status: Home / Self Care | Payer: Worker's Comp, Other unspecified | Attending: Emergency Medicine | Admitting: Emergency Medicine

## 2013-09-14 ENCOUNTER — Emergency Department: Payer: Self-pay

## 2013-09-14 DIAGNOSIS — R51 Headache: Secondary | ICD-10-CM | POA: Insufficient documentation

## 2013-09-14 DIAGNOSIS — R059 Cough, unspecified: Secondary | ICD-10-CM | POA: Insufficient documentation

## 2013-09-14 DIAGNOSIS — J111 Influenza due to unidentified influenza virus with other respiratory manifestations: Secondary | ICD-10-CM

## 2013-09-14 DIAGNOSIS — I10 Essential (primary) hypertension: Secondary | ICD-10-CM | POA: Insufficient documentation

## 2013-09-14 DIAGNOSIS — IMO0001 Reserved for inherently not codable concepts without codable children: Secondary | ICD-10-CM | POA: Insufficient documentation

## 2013-09-14 MED ORDER — ACETAMINOPHEN 500 MG PO TABS
1000.0000 mg | ORAL_TABLET | Freq: Four times a day (QID) | ORAL | Status: DC | PRN
Start: 2013-09-14 — End: 2013-09-14
  Administered 2013-09-14: 1000 mg via ORAL
  Filled 2013-09-14: qty 2

## 2013-09-14 NOTE — ED Provider Notes (Signed)
Physician/Midlevel provider first contact with patient: 09/14/13 1956             EMERGENCY DEPARTMENT HISTORY AND PHYSICAL EXAM    Date: 09/15/2013  Patient Name: [name],[name]      Disposition and Treatment Plan    Clinical Impression:   1. Influenza-like illness      Disposition: Discharge to home       History of Presenting Illness     Chief Complaint   Patient presents with   . URI     Context: home  Location: respiratory, generalized  Duration: since yesterday  Quality: aching   Timing: persistent  Maximum Severity: moderate  Modifying Factors: none  History Obtained From: patient  Additional History: [name] is a 57 y.o. female with a history of HTN presenting with persistent deep cough (worse with deep inspiration), HA, generalized myalgias, tactile fever, chills, and mild nausea/emesis since yesterday at home. Pt is a smoker (did not smoke today). Denies sore throat.    PCP: Dyke Maes, MD    Current facility-administered medications:[DISCONTINUED] acetaminophen (TYLENOL) tablet 1,000 mg, 1,000 mg, Oral, Q6H PRN, Kathryn Presser, MD, 1,000 mg at 09/14/13 2146  Current outpatient prescriptions:hydrochlorothiazide (HYDRODIURIL) 12.5 MG tablet, Take 12.5 mg by mouth daily., Disp: , Rfl: ;  AMLODIPINE BESYLATE PO, Take by mouth., Disp: , Rfl: ;  ibuprofen (ADVIL,MOTRIN) 600 MG tablet, Take 1 tablet (600 mg total) by mouth every 6 (six) hours as needed for Pain or Fever., Disp: 30 tablet, Rfl: 0;  LISINOPRIL PO, Take by mouth., Disp: , Rfl:     Past Medical History     Past Medical History   Diagnosis Date   . Hypertensive disorder      Past Surgical History   Procedure Date   . Ganglion cyst excision        Family History     Family History   Problem Relation Age of Onset   . Hypertension Mother    . Hypertension Father        Social History     History     Social History   . Marital Status: Single     Spouse Name: Mueller/A     Number of Children: Mueller/A   . Years of Education: Mueller/A     Social History Main  Topics   . Smoking status: Current Every Day Smoker -- 0.5 packs/day for 30 years     Types: Cigarettes   . Smokeless tobacco: Not on file   . Alcohol Use: No   . Drug Use: No   . Sexually Active:      Other Topics Concern   . Not on file     Social History Narrative   . No narrative on file       Allergies     No Known Allergies    Review of Systems     Positive and negative ROS elements as per HPI.  All Other Systems Reviewed and Negative: Yes    Physical Exam   BP 132/79  Pulse 126  Temp 100.8 F (38.2 C)  Resp 20  Ht 1.524 m  Wt 79.833 kg  BMI 34.37 kg/m2  SpO2 99%    Physical Exam   Nursing note and vitals reviewed.  Constitutional: She is oriented to person, place, and time and well-developed, well-nourished, and in no distress.   HENT:   Head: Normocephalic and atraumatic.   Mouth/Throat: No oropharyngeal exudate.   Neck:  Normal range of motion. Neck supple.   Cardiovascular: Normal rate, regular rhythm and normal heart sounds.    Pulmonary/Chest: Effort normal and breath sounds normal.   Abdominal: She exhibits no distension. There is no tenderness.   Musculoskeletal: Normal range of motion. She exhibits no edema.   Neurological: She is alert and oriented to person, place, and time. GCS score is 15.   Skin: Skin is warm and dry.   Psychiatric: Affect and judgment normal.           Diagnostic Study Results   Labs -    Labs Reviewed   RAPID INFLUENZA A/B ANTIGENS    Narrative:     ORDER#: [id number]                                    ORDERED BY: WOODS, AMIE  SOURCE: Nasal Aspirate                               COLLECTED:  09/14/13 19:20  ANTIBIOTICS AT COLL.:                                RECEIVED :  09/14/13 19:48  Influenza, Rapid Antigen A & B             FINAL       09/14/13 20:01  09/14/13   Negative for Influenza A and B             Reference Range: Negative         Radiologic Studies -   XR CHEST 2 VIEWS    Final Result:      No acute cardiopulmonary disease.        Mickel Duhamel, MD      09/14/2013 9:12 PM       EKG Interpretation: none    Clinical Course in the Emergency Department   CXR/flu neg. Looks well. Flu-like illness. Will d/c with supportive care instructions.      Medical Decision Making     I am the first provider for this patient.  I reviewed the vital signs, nursing notes, past medical history, past surgical history, family history and social history.      Vital Signs - No data found.      Pulse Oximetry Analysis - Normal    Differential Diagnosis (not completely inclusive): URI vs influenza    Laboratory results reviewed by EDP: yes  Radiologic study results reviewed by EDP: yes  Radiologic Studies Interpreted (viewed) by EDP: yes        _______________________________    Attestations:  I was acting as a Neurosurgeon for Kathryn Presser, MD on [name],[name]  Treatment Team: Scribe: Briscoe Deutscher     I am the first provider for this patient and I personally performed the services documented. Treatment Team: Scribe: Briscoe Deutscher is scribing for me on Strickler,Bailynn Mueller. This note accurately reflects work and decisions made by me.  Kathryn Presser, MD  ______________________________            Kathryn Presser, MD  09/15/13 [phone]

## 2013-09-14 NOTE — Discharge Instructions (Signed)
Your doctor today was Salameh, Loretha Stapler, MD. Thank you for the opportunity to care for you. You have been diagnosed with:   1. Influenza-like illness        Take motrin and tylenol as needed and as directed for pain and fever relief.    Please call to follow up with your primary care doctor in 2-3 days.     Return to the Emergency Room for worsening symptoms or any other serious concerns.

## 2013-09-14 NOTE — ED Notes (Signed)
Pt has URI symptoms x 2 days

## 2013-10-19 ENCOUNTER — Emergency Department: Payer: No Typology Code available for payment source

## 2013-10-19 ENCOUNTER — Emergency Department
Admission: EM | Admit: 2013-10-19 | Discharge: 2013-10-19 | Disposition: A | Discharge status: Home / Self Care | Payer: Self-pay | Attending: Emergency Medicine | Admitting: Emergency Medicine

## 2013-10-19 DIAGNOSIS — Z8739 Personal history of other diseases of the musculoskeletal system and connective tissue: Secondary | ICD-10-CM | POA: Insufficient documentation

## 2013-10-19 DIAGNOSIS — F17200 Nicotine dependence, unspecified, uncomplicated: Secondary | ICD-10-CM | POA: Insufficient documentation

## 2013-10-19 DIAGNOSIS — I10 Essential (primary) hypertension: Secondary | ICD-10-CM | POA: Insufficient documentation

## 2013-10-19 DIAGNOSIS — M549 Dorsalgia, unspecified: Secondary | ICD-10-CM | POA: Insufficient documentation

## 2013-10-19 MED ORDER — METHYLPREDNISOLONE 4 MG PO KIT
PACK | ORAL | Status: DC
Start: 2013-10-19 — End: 2014-06-30

## 2013-10-19 MED ORDER — DIAZEPAM 5 MG PO TABS
5.0000 mg | ORAL_TABLET | Freq: Four times a day (QID) | ORAL | Status: DC | PRN
Start: 2013-10-19 — End: 2014-06-30

## 2013-10-19 NOTE — ED Provider Notes (Signed)
Physician/Midlevel provider first contact with patient: 10/19/13 4696             EMERGENCY DEPARTMENT HISTORY AND PHYSICAL EXAM    Date: 10/19/2013  Patient Name: [name],[name]      Disposition and Treatment Plan    Clinical Impression: 1. Back pain      Disposition: ED Disposition     Discharge Farrie Sann Canupp discharge to home/self care.    Condition at disposition: Stable               History of Presenting Illness     Chief Complaint   Patient presents with   . Back Pain     53F HTN arthritis who p/w L upper back pain for 1 week.  Has had episodes for several weeks but has been constant for 1 week, only relieved by her tramadol.  Worse with movement.  No chest pain or sob.  No Mueller/v.  Has had this issue in the past but never evaluated for it.    PCP: Dyke Maes, MD    Current Medications     No current facility-administered medications for this encounter.  Current outpatient prescriptions:losartan (COZAAR) 25 MG tablet, Take 25 mg by mouth daily., Disp: , Rfl: ;  traMADol (ULTRAM) 50 MG tablet, Take 50 mg by mouth every 6 (six) hours as needed., Disp: , Rfl: ;  AMLODIPINE BESYLATE PO, Take by mouth., Disp: , Rfl: ;  diazepam (VALIUM) 5 MG tablet, Take 1 tablet (5 mg total) by mouth every 6 (six) hours as needed for Anxiety., Disp: 12 tablet, Rfl: 0  ibuprofen (ADVIL,MOTRIN) 600 MG tablet, Take 1 tablet (600 mg total) by mouth every 6 (six) hours as needed for Pain or Fever., Disp: 30 tablet, Rfl: 0;  methylPREDNISolone (MEDROL DOSEPACK) 4 MG tablet, Use as directed, Disp: 21 tablet, Rfl: 0;  [DISCONTINUED] hydrochlorothiazide (HYDRODIURIL) 12.5 MG tablet, Take 12.5 mg by mouth daily., Disp: , Rfl: ;  [DISCONTINUED] LISINOPRIL PO, Take by mouth., Disp: , Rfl:     Past Medical History     Past Medical History   Diagnosis Date   . Hypertensive disorder      Past Surgical History   Procedure Date   . Ganglion cyst excision        Family History     Family History   Problem Relation Age of Onset   . Hypertension  Mother    . Hypertension Father        Social History     History     Social History   . Marital Status: Single     Spouse Name: Mueller/A     Number of Children: Mueller/A   . Years of Education: Mueller/A     Social History Main Topics   . Smoking status: Current Every Day Smoker -- 0.5 packs/day for 30 years     Types: Cigarettes   . Smokeless tobacco: Not on file   . Alcohol Use: No   . Drug Use: No   . Sexually Active:      Other Topics Concern   . Not on file     Social History Narrative   . No narrative on file       Allergies     No Known Allergies    Review of Systems     Positive and negative ROS elements as per HPI.  All Other Systems Reviewed and Negative: Yes    Physical Exam   BP  130/85  Pulse 100  Temp 97.8 F (36.6 C)  Resp 20  Ht 1.524 m  Wt 79.833 kg  BMI 34.37 kg/m2  SpO2 99%  Constitutional: Vital signs reviewed. Well appearing.  Head: Normocephalic, atraumatic  Eyes: No conjunctival injection. No discharge.  ENT: Mucous membranes moist  Neck: Normal range of motion. Non-tender.  Respiratory/Chest: Clear to auscultation. No respiratory distress. ttp over L thoracic paraspinal muscles.  Cardiovascular: Regular rate and rhythm. No murmur.   Abdomen: Soft and non-tender. No guarding. No masses or hepatosplenomegaly.  LowerExtremity: No edema. No cyanosis.  Neurological: No focal motor deficits by observation. Speech normal. Memory normal.  Skin: Warm and dry. No rash.  Lymphatic: No cervical lymphadenopathy.  Psychiatric: Normal affect. Normal concentration.    Diagnostic Study Results   Labs -     Results     ** No Results found for the last 24 hours. **        Radiologic Studies -   Radiology Results (24 Hour)     ** No Results found for the last 24 hours. **      .      Clinical Course in the Emergency Department         Medical Decision Making     I am the first provider for this patient.  I reviewed the vital signs, nursing notes, past medical history, past surgical history, family history and social  history.      Vital Signs - Patient Vitals for the past 12 hrs:   BP Temp Pulse Resp   10/19/13 0310 130/85 mmHg 97.8 F (36.6 C) 100  20        Pulse Oximetry Analysis - Normal    Differential Diagnosis (not completely inclusive): musk back pain    Laboratory results reviewed by EDP: no  Radiologic study results reviewed by EDP: no  Radiologic Studies Interpreted (viewed) by EDP: no      NO SCRIBE            Pat Patrick, MD  10/19/13 [phone]

## 2013-10-19 NOTE — ED Notes (Signed)
Patient with complaint of back pain radiating to left chest

## 2013-10-19 NOTE — Discharge Instructions (Signed)
Back Pain NOS    You have been seen for back pain.    Back pain can happen anywhere from the neck down to the low back. Back pain has many different causes. Some of the more common are: Bone pain, muscle strain, muscle spasm, pain from overuse, and pinched nerves. Other problems can cause what feels like back pain. But the pain is really coming from another organ. This could be a kidney infection. This can cause lower back pain.    Your doctor did not find any pain over the bones in your back (even though you might have pain in the muscles of the back). This means it is very unlikely that you have a broken bone (fracture) in your back. Your doctor did not think it was necessary to take an x-ray.    The doctor still does not know the exact cause of your pain. Your problem does not seem to be from a dangerous cause. It is safe for you to go home today.    Some things you can try to help your back feel better are:   Apply a warm damp washcloth to the back where you have pain for 20 minutes at a time, at least 4 times per day.    Have someone massage the sore parts of your back.    Don t do any heavy lifting or bending. You can go back to normal daily activities if they don t make the pain worse.   You can use anti-inflammatory pain medicine for your pain. This could be Ibuprofen (Advil or Motrin). You can buy these at most stores. Follow the directions on the package.    This pain may last for the next few days. If your pain gets better, you probably do not need to see a doctor. However, if your symptoms get worse or you have new symptoms, you should return here or go to the nearest Emergency Department.    Call your physician or go to the nearest Emergency Department if you your pain does not improve within 4 weeks or your pain is bad enough to seriously limit your normal activities.    YOU SHOULD SEEK MEDICAL ATTENTION IMMEDIATELY, EITHER HERE OR AT THE NEAREST EMERGENCY DEPARTMENT, IF ANY OF THE  FOLLOWING OCCURS:   - You think the pain is coming from somewhere other than your back. This can include chest pain. This is sometimes from angina (heart pains) or other dangerous causes.   You have shortness of breath, sweating, chest pain (or pressure, heaviness, indigestion, etc).   You have abdominal (belly) pain that goes through to your back.   Your arms and legs tingle or get numb (lose feeling).   Your arms or legs are weak.   You lose control of your bladder or bowels. If this were to happen, it may cause you to wet or soil yourself.    You have problems urinating (peeing).   You have fevers (a temperature of more than 100.4F or 38C).   Your pain gets worse.      Sedating Medication (Edu)    You have been given a medication, or a prescription for a medication, that causes drowsiness or dizziness. While taking this medication, DO NOT drive a car, operate machinery, or perform jobs that require you to be alert.    DO NOT drink alcoholic beverages while taking this medicine.    If you become dizzy, sit or lie down at the first signs. You should be   careful when going up and down stairs.    Never give this medication to others.    Keep this medication out of reach of children.    You should not take or save old medications. They should be thrown away when outdated.    Keep all medicines in a cool, dry place. DO NOT keep them in your bathroom medicine cabinet or in a cabinet above the stove.

## 2014-03-03 ENCOUNTER — Emergency Department: Payer: No Typology Code available for payment source

## 2014-03-03 ENCOUNTER — Ambulatory Visit (INDEPENDENT_AMBULATORY_CARE_PROVIDER_SITE_OTHER): Payer: Self-pay

## 2014-03-03 ENCOUNTER — Emergency Department
Admission: EM | Admit: 2014-03-03 | Discharge: 2014-03-03 | Disposition: A | Discharge status: Home / Self Care | Payer: No Typology Code available for payment source | Attending: Emergency Medicine | Admitting: Emergency Medicine

## 2014-03-03 DIAGNOSIS — L255 Unspecified contact dermatitis due to plants, except food: Secondary | ICD-10-CM | POA: Insufficient documentation

## 2014-03-03 DIAGNOSIS — I10 Essential (primary) hypertension: Secondary | ICD-10-CM | POA: Insufficient documentation

## 2014-03-03 DIAGNOSIS — F17200 Nicotine dependence, unspecified, uncomplicated: Secondary | ICD-10-CM | POA: Insufficient documentation

## 2014-03-03 DIAGNOSIS — L237 Allergic contact dermatitis due to plants, except food: Secondary | ICD-10-CM

## 2014-03-03 HISTORY — DX: Anxiety disorder, unspecified: F41.9

## 2014-03-03 MED ORDER — HYDROXYZINE PAMOATE 25 MG PO CAPS
25.0000 mg | ORAL_CAPSULE | Freq: Three times a day (TID) | ORAL | Status: DC | PRN
Start: 2014-03-03 — End: 2014-06-30

## 2014-03-03 MED ORDER — BETAMETHASONE DIPROPIONATE 0.05 % EX CREA
TOPICAL_CREAM | Freq: Two times a day (BID) | CUTANEOUS | Status: AC
Start: 2014-03-03 — End: 2014-03-10

## 2014-03-03 NOTE — Discharge Instructions (Signed)
Please follow up with dermatology in 3-5 days if your rash is not improving.      Contact Dermatitis    You have been diagnosed with contact dermatitis.    Contact dermatitis is a skin irritation. It is caused by contact with something. Sometimes the rash is from an allergic reaction. Sometimes it is from a simple chemical irritation. Some symptoms are itching and redness of the skin. In bad cases, there may be blistering. This condition is not often dangerous. However, it can be frustrating.    Contact dermatitis can be caused, for example, by: detergents, soaps and shampoos. It can be caused by nickel (found in jewelry) or poison ivy/oak. Other possible causes are wool, clothing starch, cleaning fluids, etc.    Often, the item that caused the rash cannot be identified. If you have contact dermatitis often, see a dermatologist (skin doctor) or allergist. There, you will have tests to find out the cause. If you are lucky enough to find out the cause, you can avoid it in the future.    General treatment for this condition includes:   Antihistamines (anti-itch medicines). Some antihistamines can limit itching. This includes diphenhydramine (Benadryl): 1-2 (25 mg) tablets every 6 hours). You can get this medicine without a prescription. It also includes prescription strength hydroxyzine (Atarax). Antihistamines can make you drowsy. Therefore, use them with caution. Avoid them if you must be alert (on the job or when driving, for example).   Steroid (cortisone) cream. Apply steroid creams sparingly (in small amounts) 2 times each day to the affected areas. These creams can be used until the skin irritation is gone and the skin feels normal to the touch. Put steroid creams on clean, damp skin. Then cover with a thick layer of moisturizer. As a general rule, do not use steroid cream on the face for more than 7 days. Do not use on other areas of the body for more than 14 days. Follow your doctor s instructions if  he or she says that longer use is okay.   Moisturizers. Put on skin moisturizers many times a day. 5 times or more is a good amount. Good moisturizers "seal in" water and moisture to the skin. Use them directly after (on top of) steroid creams. Also use them (alone) many more times to keep the skin from looking and feeling dry. Use greasy, stiff moisturizers that are too thick to pour. Eucerin cream (not lotion) is an example of a good brand. It can be bought over the counter (without a prescription). Vaseline is a less expensive, good skin moisturizer. Food shortening (Crisco) is another example of such a moisturizer.    YOU SHOULD SEEK MEDICAL ATTENTION IMMEDIATELY, EITHER HERE OR AT THE NEAREST EMERGENCY DEPARTMENT, IF ANY OF THE FOLLOWING OCCURS:   Symptoms get worse even with treatment.   You get signs of infection at the affected areas. Signs include redness, pus, pain, swelling or fever (temperature higher than 100.49F / 38C).

## 2014-03-03 NOTE — ED Notes (Signed)
Pt here for c/o rash to bilateral lower arms and a little to chest after working in the yard on Sunday and coming into contact with poison ivy; rash began on Monday

## 2014-03-04 NOTE — ED Provider Notes (Addendum)
Physician/Midlevel provider first contact with patient: 03/03/14 1750         EMERGENCY DEPARTMENT HISTORY AND PHYSICAL EXAM    Date: 03/04/2014  Patient Name: [name]  Attending Physician: Gracelyn Nurse, MD          Disposition and Treatment Plan    Clinical Impression:   1. Poison ivy dermatitis      Disposition:   ED Disposition    Discharge Monike Bragdon Bandel discharge to home/self care.    Condition at disposition: Stable               History of Presenting Illness     Chief Complaint   Patient presents with   . Poison Ivy   . Rash     Context: home  History Obtained From: patient  Additional History: [name] is a 58 y.o. female pw rash to b/l upper arms. Pt states occurred on Saturday while working in yard and has become very itching. Minimal amount of weeping. Pt has been calamine lotion to help with the itching. Denies any sick contacts. Denies any fever. Pt states she is very aggrevated due to itching and rash.       Past Medical History     Past Medical History   Diagnosis Date   . Hypertensive disorder    . Anxiety        Past Surgical History   Procedure Laterality Date   . Ganglion cyst excision         Family History     Family History   Problem Relation Age of Onset   . Hypertension Mother    . Hypertension Father        Social History     History     Social History   . Marital Status: Single     Spouse Name: N/A     Number of Children: N/A   . Years of Education: N/A     Occupational History   . Not on file.     Social History Main Topics   . Smoking status: Current Every Day Smoker -- 0.50 packs/day for 30 years     Types: Cigarettes   . Smokeless tobacco: Not on file   . Alcohol Use: No   . Drug Use: No   . Sexual Activity: Not on file     Other Topics Concern   . Not on file     Social History Narrative        Allergies     Allergies   Allergen Reactions   . Latex Rash       Medications     No current facility-administered medications for this encounter.  Current outpatient  prescriptions:ALPRAZolam (XANAX) 0.5 MG tablet, Take 0.5 mg by mouth nightly as needed., Disp: , Rfl: ;  AMLODIPINE BESYLATE PO, Take by mouth., Disp: , Rfl: ;  losartan (COZAAR) 25 MG tablet, Take 25 mg by mouth daily., Disp: , Rfl: ;  traMADol (ULTRAM) 50 MG tablet, Take 50 mg by mouth every 6 (six) hours as needed., Disp: , Rfl:   betamethasone dipropionate (DIPROLENE) 0.05 % cream, Apply topically 2 (two) times daily. Apply to affected area, Disp: 30 g, Rfl: 0;  diazepam (VALIUM) 5 MG tablet, Take 1 tablet (5 mg total) by mouth every 6 (six) hours as needed for Anxiety., Disp: 12 tablet, Rfl: 0;  hydrOXYzine (VISTARIL) 25 MG capsule, Take 1 capsule (25 mg total) by mouth 3 (three) times daily  as needed for Itching., Disp: 30 capsule, Rfl: 0  ibuprofen (ADVIL,MOTRIN) 600 MG tablet, Take 1 tablet (600 mg total) by mouth every 6 (six) hours as needed for Pain or Fever., Disp: 30 tablet, Rfl: 0;  methylPREDNISolone (MEDROL DOSEPACK) 4 MG tablet, Use as directed, Disp: 21 tablet, Rfl: 0    Review of Systems     Pertinent Positives and Negatives noted in the HPI.  All Other Systems Reviewed and Negative: No    Physical Exam   BP 117/70   Pulse 114   Temp(Src) 98.1 F (36.7 C)   Resp 19   Ht 1.524 m   Wt 80.287 kg   BMI 34.57 kg/m2     SpO2 100%     Constitutional: Vital signs reviewed. Well appearing.  Head: Normocephalic, atraumatic  ENT: Mucous membranes moist  Neck:Trachea midline.  Respiratory/Chest:  No dyspnea or work of breathing.  Neurological: Alert Normal and symmetric motor tone by observation. No facial assymetry.  Skin: Warm and dry. +small scattered rash over forearms b/l with small vesicles, mild redness and swelling, no warmth  Psychiatric: Normal affect.     Diagnostic Study Results     Labs -     Results    ** No results found for the last 24 hours. **          Radiologic Studies -   Radiology Results (24 Hour)    ** No results found for the last 24 hours. **      .    Clinical Course in the  Emergency Department     Rash consistant with contact deramtitis from poison ivy. Rx for hydroxyzine and topical steroid. Advised follow up with derm if it does not resolve or improve.    Based on the patient's clinical presentation, vital signs, and diagnostic studies, I feel the patient is safe for discharge. The work up has shown no signs of life-threatening emergency. The patient understands that primary care follow up is needed, and if unable to do this - the patient should return to the ER for a repeat evaluation.    The patient/family understands their instructions and the patient is well-appearing at time of discharge. I explained results and discharge instructions to the patient/family. The patient/family acknowledges understanding and agrees with plan.      Medical Decision Making   I am the first provider for this patient.  I reviewed the vital signs, nursing notes, past medical history, past surgical history, family history and social history.  I have reviewed the patient's previous charts.  Vital Signs - BP 117/70   Pulse 114   Temp(Src) 98.1 F (36.7 C)   Resp 19   Ht 1.524 m   Wt 80.287 kg   BMI 34.57 kg/m2     SpO2 100%    Pulse Oximetry Analysis - Normal  Differential Diagnosis (not completely inclusive): Cellulitis, Erysipalis, Dermatitis, Abscess, Sepsis  Laboratory results reviewed by EDP: N/A  Radiologic study results reviewed by EDP: N/A  Radiologic Studies Interpreted (viewed) by EDP: N/A  If ordered, independent visualization of images, tracings, or specimens by me: Yes  If clinical lab tests ordered, they are reviewed by me.  Discussed with other providers: N/A  _______________________________    Attestations:    I am the first provider for this patient and I personally performed the services documented.  I reviewed and confirm the accuracy of the information in this medical record.   Gracelyn Nurse, MD    _______________________________  Gracelyn Nurse, MD  03/04/14  1914    Gracelyn Nurse, MD  03/04/14 [phone]

## 2014-03-06 ENCOUNTER — Emergency Department
Admission: EM | Admit: 2014-03-06 | Discharge: 2014-03-06 | Disposition: A | Discharge status: Home / Self Care | Payer: No Typology Code available for payment source | Attending: Emergency Medicine | Admitting: Emergency Medicine

## 2014-03-06 ENCOUNTER — Emergency Department: Payer: No Typology Code available for payment source

## 2014-03-06 DIAGNOSIS — L237 Allergic contact dermatitis due to plants, except food: Secondary | ICD-10-CM

## 2014-03-06 DIAGNOSIS — L255 Unspecified contact dermatitis due to plants, except food: Secondary | ICD-10-CM | POA: Insufficient documentation

## 2014-03-06 NOTE — ED Provider Notes (Signed)
1. Poison ivy dermatitis         ED Disposition    Discharge [name] discharge to home/self care.    Condition at disposition: Stable                RASH    HPI RASH     [name] is a 58 y.o. female who presents with persistent rash to BUE, face and BLE. Patient was seen at [facility] on 03/02/14 for the same symptoms and was diagnosed with poison ivy. She was not started on any steroids at that time. She reports that symptoms have persisted and reports increased itchiness since initial visit. Patient has been using OTC ointment with no improvement. Patient denies any other concerns.     CHIEF COMPLAINT: rash.   HISTORIAN: History obtained from patient.   TIME COURSE: Onset was 4 days prior to arrival.   QUALITY: itchy.   ASSOCIATED WITH: Itching.   SEVERITY: Maximum severity is mild, Currently symptoms are mild.   EXACERBATED BY: Scratching.   RELIEVED BY: Nothing.     PAST MEDICAL HISTORY   MEDICAL HISTORY: Past medical history is not significant.   SOCIAL HISTORY: Denies alcohol abuse, Denies tobacco abuse, Denies drug abuse, Lives at home with family.     ROS   CONSTITUTIONAL: Negative constitutional review of systems, No fever, No chills.   EYES: Negative eye review of systems.   ENT: Negative ENT review of systems.   CARDIOVASCULAR: Negative cardiovascular review of systems, No chest pain.   RESPIRATORY: Negative respiratory review of systems, No SOB.   GENITOURINARY FEMALE: Historian reports itching.   SKIN: Historian reports rash.   PSYCHIATRIC: Negative psychiatric review of systems.   NOTES: All systems were reviewed and are negative for acute complaints except as described above.     PHYSICAL EXAM   CONSTITUTIONAL: Patient is afebrile, Vital signs reviewed, Patient has normal pulse, Patient has normal blood pressure, Patient has normal respiratory rate, Well appearing, Patient appears comfortable, Alert and oriented X 3.   HEAD: Atraumatic, Normocephalic.   EYES: Eyes are normal to inspection,  Pupils equal, round and reactive to light, No discharge from eyes, Extraocular muscles intact, Sclera are normal, Conjunctiva are normal.   ENT: Ears normal to inspection, Nose examination normal, Posterior pharynx normal, Mouth normal to inspection.   NECK: Normal ROM, No jugular venous distention, No meningeal signs, Cervical spine nontender.   RESPIRATORY CHEST: Chest is nontender, Breath sounds normal, No respiratory distress.   SKIN: Skin is warm and dry, Vesicular rash with erythematous base to BUE, R>L. Few splotches on forehead, nose, and legs.   PSYCHIATRIC: Oriented X 3, Normal affect, Normal insight, Normal concentration.         Will discharge home with written prescription of prednisone.         ATTESTATION:     I was acting as a scribe for Arlana Lindau, MD on Renelda Mom (ED Scribe)  7:18 PM   03/06/2014    I am the first provider for this patient and I personally performed the services documented. Graciella Freer is scribing for me on Beane,Aleathea N. This note accurately reflects work and decisions made by me.  Arlana Lindau, MD   7:18 PM    03/06/2014        Arlana Lindau, MD  03/07/14 [phone]

## 2014-03-06 NOTE — ED Notes (Signed)
Pt d/c to home. Stable, ambulates with steady gait. All belongings with pt.     Rx x 1 educated and given to pt.

## 2014-03-06 NOTE — ED Notes (Signed)
Was seen here for poison ivy on Monday. Pt states rash has gotten worst and is spreading

## 2014-03-06 NOTE — Discharge Instructions (Signed)
Your doctor today was Arlana Lindau, MD    Please follow up with your primary care doctor in the next 1-2 days.    Take Steroids as directed.     Use over the counter benadryl or calamine lotion.    Return to the ER for worsening symptoms or any other concerns.        Poison Ivy / [employer] have been diagnosed with a rash caused by one of the "Rhus plants" (Poison [place], [place], [place] and [employer]). This rash is sometimes called "Rhus Dermatitis."    Poison ivy rashes are caused by exposure to poison ivy-like plants. Poison [place] is similar to poison ivy and grows in the [facility]. Another cousin is poison [place]. It grows in the [place]. The oil from the leaves, stem and roots of these plants all have a chemical called urushiol. This chemical causes an allergic reaction in up to 80% (8 out of 10) of the people who get the plant oil on their skin. The oil may be carried on the fur of pets, clothing, shoes, toys, tools or other objects. The oil can then be transferred to the skin. Washing it off of the skin within 15 minutes with soap and water can prevent a reaction. After 15 minutes of contact, the urushiol attaches to skin proteins. Then it can't be washed off. It also can t be transferred to other areas! Around 24 to 36 hours (and even up to 5 days) after a person is exposed to urushiol, a rash develops. It is blistery and itching. Scratching or oozing blister fluid cannot spread the rash to other areas of the body or to others. New rash areas that show up after the original areas may be areas where there was less oil. They might also just be less sensitive areas. They do not mean the rash is "spreading."    To keep the rash from spreading, wash all clothes and other personal items that may have had contact with the plant. Many people believe the fluid from the blisters is contagious. This is not true. Get all the plant oil/material from your skin and clothing. If you don t, the material  may be transferred to another place on your body. This can spread the chemical.    You were given your first dose of steroids by mouth. Fill the prescription. Start taking the medicine as directed tomorrow. It is important to finish the prescription even if the rash and itching stop. If you don't take all the medicine, the symptoms may start again. They could even get worse.    You can purchase diphenhydramine (Benadryl) at any pharmacy. You do not need a prescription. Take one or two 25 mg tablets every 6-8 hours as needed for itching. It may cause drowsiness. Do not drive a car or anything that requires focus or good judgment. There is another group of antihistamines that are effective are H2 blockers available over the counter (without a prescription). These include famotidine (Pepcid) and ranitidine (Zantac). You may have heard of these medicines before. They are used to reduce stomach acid. They also work very well to help stop itching. Famotidine or ranitidine can be taken at the same time as diphenhydramine. Famotidine and ranitidine should not make you sleepy.    Putting a cool, wet towel to the area can also help with itching. Also try Calamine lotion on the rash. This can help with itching. NEVER use bleach on the  skin to help "heal" the rash. This is an old wives tale. It can cause severe skin irritation. It can even cause injury!    In the future, avoid such plants. Always wear long pants and long-sleeved shirts when outside where poison ivy/oak/sumac or other unfamiliar plants might grow.    YOU SHOULD SEEK MEDICAL ATTENTION IMMEDIATELY, EITHER HERE OR AT THE NEAREST EMERGENCY DEPARTMENT, IF ANY OF THE FOLLOWING OCCURS:   Fever (temperature higher than 100.80F / 38C).   Rash involving the mouth or eyes, swelling of the lips or tongue, trouble breathing.   Drainage (pus) or red streaks from the blisters.   Severe itching not relieved by the above treatments or prescribed medicines.

## 2014-05-01 ENCOUNTER — Emergency Department: Payer: No Typology Code available for payment source

## 2014-05-01 ENCOUNTER — Emergency Department
Admission: EM | Admit: 2014-05-01 | Discharge: 2014-05-01 | Disposition: A | Discharge status: Home / Self Care | Payer: No Typology Code available for payment source | Attending: Emergency Medicine | Admitting: Emergency Medicine

## 2014-05-01 DIAGNOSIS — I10 Essential (primary) hypertension: Secondary | ICD-10-CM | POA: Insufficient documentation

## 2014-05-01 DIAGNOSIS — J209 Acute bronchitis, unspecified: Secondary | ICD-10-CM | POA: Insufficient documentation

## 2014-05-01 DIAGNOSIS — F17200 Nicotine dependence, unspecified, uncomplicated: Secondary | ICD-10-CM | POA: Insufficient documentation

## 2014-05-01 MED ORDER — GUAIFENESIN-CODEINE 100-10 MG/5ML PO SYRP
10.0000 mL | ORAL_SOLUTION | Freq: Three times a day (TID) | ORAL | Status: DC | PRN
Start: 2014-05-01 — End: 2014-06-30

## 2014-05-01 MED ORDER — BENZONATATE 100 MG PO CAPS
100.0000 mg | ORAL_CAPSULE | Freq: Three times a day (TID) | ORAL | Status: DC | PRN
Start: 2014-05-01 — End: 2014-06-30

## 2014-05-01 MED ORDER — ALBUTEROL SULFATE (2.5 MG/3ML) 0.083% IN NEBU
2.5000 mg | INHALATION_SOLUTION | Freq: Once | RESPIRATORY_TRACT | Status: AC
Start: 2014-05-01 — End: 2014-05-01
  Administered 2014-05-01: 2.5 mg via RESPIRATORY_TRACT
  Filled 2014-05-01: qty 3

## 2014-05-01 MED ORDER — IPRATROPIUM BROMIDE 0.02 % IN SOLN
0.5000 mg | Freq: Once | RESPIRATORY_TRACT | Status: AC
Start: 2014-05-01 — End: 2014-05-01
  Administered 2014-05-01: 0.5 mg via RESPIRATORY_TRACT
  Filled 2014-05-01: qty 2.5

## 2014-05-01 MED ORDER — BENZONATATE 100 MG PO CAPS
200.0000 mg | ORAL_CAPSULE | Freq: Once | ORAL | Status: AC
Start: 2014-05-01 — End: 2014-05-01
  Administered 2014-05-01: 200 mg via ORAL
  Filled 2014-05-01: qty 2

## 2014-05-01 MED ORDER — ALBUTEROL SULFATE HFA 108 (90 BASE) MCG/ACT IN AERS
2.0000 | INHALATION_SPRAY | RESPIRATORY_TRACT | Status: DC | PRN
Start: 2014-05-01 — End: 2014-06-30

## 2014-05-01 MED ORDER — IBUPROFEN 600 MG PO TABS
600.0000 mg | ORAL_TABLET | Freq: Once | ORAL | Status: AC
Start: 2014-05-01 — End: 2014-05-01
  Administered 2014-05-01: 600 mg via ORAL
  Filled 2014-05-01: qty 1

## 2014-05-01 MED ORDER — ONDANSETRON 4 MG PO TBDP
4.0000 mg | ORAL_TABLET | Freq: Once | ORAL | Status: AC
Start: 2014-05-01 — End: 2014-05-01
  Administered 2014-05-01: 4 mg via ORAL
  Filled 2014-05-01: qty 1

## 2014-05-01 MED ORDER — AZITHROMYCIN 250 MG PO TABS
250.0000 mg | ORAL_TABLET | Freq: Every day | ORAL | Status: AC
Start: 2014-05-01 — End: 2014-05-07

## 2014-05-01 NOTE — ED Notes (Signed)
Here qwith c/o cough/headache/nausea. Symptoms started approx 1 week ago. 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 Symptoms started with cough.

## 2014-05-01 NOTE — ED Provider Notes (Signed)
Physician/Midlevel provider first contact with patient: 05/01/14 2029         History     Chief Complaint   Patient presents with   . cough/headache/nausea     HPI Comments: Pt present to the ED with complaint of coughing with yellow drainage started 7 days ago, pt denise fever pt state she is a smoker but has not smoked for the past 7 days. Pt state that now she has a headache has abdominal pain and feels nauseated from the coughing. 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     The history is provided by the patient.         Past Medical History   Diagnosis Date   . Hypertensive disorder    . Anxiety        Past Surgical History   Procedure Laterality Date   . Ganglion cyst excision         Family History   Problem Relation Age of Onset   . Hypertension Mother    . Hypertension Father        Social  History   Substance Use Topics   . Smoking status: Current Every Day Smoker -- 0.50 packs/day for 30 years     Types: Cigarettes   . Smokeless tobacco: Not on file   . Alcohol Use: No       .     Allergies   Allergen Reactions   . Latex Rash       Discharge Medication List as of 05/01/2014  9:50 PM      CONTINUE these medications which have NOT CHANGED    Details   ALPRAZolam (XANAX) 0.5 MG tablet Take 0.5 mg by mouth nightly as needed., Until Discontinued, Historical Med      AMLODIPINE BESYLATE PO Take by mouth., Until Discontinued, Historical Med      diazepam (VALIUM) 5 MG tablet Take 1 tablet (5 mg total) by mouth every 6 (six) hours as needed for Anxiety., Starting 10/19/2013, Until Discontinued, Print      hydrOXYzine (VISTARIL) 25 MG capsule Take 1 capsule (25 mg total) by mouth 3 (three) times daily as needed for Itching., Starting 03/03/2014, Until Discontinued, Print      ibuprofen (ADVIL,MOTRIN) 600 MG tablet Take 1 tablet (600 mg total) by mouth every 6 (six) hours as needed for Pain or Fever., Starting 06/02/2013, Until Discontinued, Print       losartan (COZAAR) 25 MG tablet Take 25 mg by mouth daily., Until Discontinued, Historical Med      losartan-hydrochlorothiazide (HYZAAR) 100-12.5 MG per tablet Take 1 tablet by mouth daily., Until Discontinued, Historical Med      methylPREDNISolone (MEDROL DOSEPACK) 4 MG tablet Use as directed, Print      traMADol (ULTRAM) 50 MG tablet Take 50 mg by mouth every 6 (six) hours as needed., Until Discontinued, Historical Med              Review of Systems   Respiratory: Positive for cough.    Gastrointestinal: Positive for nausea and abdominal pain.   Neurological: Positive for headaches.   All other systems reviewed and are negative.      Physical Exam    BP: 139/89 mmHg, Heart Rate: 133, Temp: 99.3 F (37.4 C), Resp Rate: 18, SpO2: 99 %, Weight: 80.287 kg    Physical Exam   Constitutional: She is oriented to person, place, and time. She appears  well-developed and well-nourished.   Uncomfortable coughing deep nonproductive.    Neck: Normal range of motion. Neck supple.   Cardiovascular: Normal rate and regular rhythm.    Pulmonary/Chest: Effort normal.   Coughing with deep inspiration, no wheezing some crackles to the bases.    Abdominal: Soft. Bowel sounds are normal.   Musculoskeletal: Normal range of motion.   Neurological: She is alert and oriented to person, place, and time.   Skin: Skin is warm and dry.   Nursing note and vitals reviewed.      MDM and ED Course     ED Medication Orders    Start     Status Ordering Provider    05/01/14 2209  ibuprofen (ADVIL,MOTRIN) tablet 600 mg   Once     Route: Oral  Ordered Dose: 600 mg     Last MAR action:  Given AL-KADIRI, [facility]    05/01/14 2052  albuterol (PROVENTIL) nebulizer solution 2.5 mg   RT - Once     Route: Nebulization  Ordered Dose: 2.5 mg     Last MAR action:  Given AL-KADIRI, [facility]    05/01/14 2052  ipratropium (ATROVENT) 0.02 % nebulizer solution 0.5 mg   RT - Once     Route: Nebulization  Ordered Dose: 0.5 mg     Last MAR action:  Given AL-KADIRI,  [facility]    05/01/14 2052  benzonatate (TESSALON) capsule 200 mg   Once     Route: Oral  Ordered Dose: 200 mg     Last MAR action:  Given AL-KADIRI, [facility]    05/01/14 2044  ondansetron (ZOFRAN-ODT) disintegrating tablet 4 mg   Once     Route: Oral  Ordered Dose: 4 mg     Last MAR action:  Given AL-KADIRI, [facility] Heinle           MDM  Number of Diagnoses or Management Options  Acute bronchitis, unspecified organism:   Diagnosis management comments: R/o pneumonia, URI, sinusitis.     Medical Decision Making      I reviewed the vital signs, nursing notes, past medical history, past surgical history, family history and social history.  Geraldine Tesar Al-Kadiri     Vital Signs - BP 144/84 mmHg  Pulse 98  Temp(Src) 99.3 F (37.4 C) (Oral)  Resp 18  Ht 1.524 m  Wt 80.287 kg  BMI 34.57 kg/m2  SpO2 97%    Pulse Oximetry Analysis -  Normal    Laboratory results reviewed by EDP: No    Radiologic study results reviewed by EDP: Yes  Radiologic Studies Interpreted (viewed) by EDP: Yes    Results    ** No results found for the last 24 hours. **          Radiology Results (24 Hour)    Procedure Component Value Units Date/Time    Chest 2 Views [[id number]] Collected:  05/01/14 2133    Order Status:  Completed Updated:  05/01/14 2137    Narrative:      HISTORY: Cough    COMPARISON: 09/14/2013    PA and lateral chest x-rays show clear lungs.  Costophrenic and  cardiophrenic angles are sharp.  Cardiomediastinal silhouette is not  enlarged.  No focal bony lesions are seen.      Impression:        No acute cardiopulmonary processes.    Kristine Linea, MD   05/01/2014 9:33 PM             BS improved with  neb treatment still cough with deep inspiration but now able to breath even deeper.  No crackles to the bases.     Procedures    Clinical Impression & Disposition     Clinical Impression  Final diagnoses:   Acute bronchitis, unspecified organism        ED Disposition    Discharge Wynn Banker Vickrey discharge to home/self care.    Condition at  disposition: Stable             Discharge Medication List as of 05/01/2014  9:50 PM      START taking these medications    Details   albuterol (PROAIR HFA) 108 (90 BASE) MCG/ACT inhaler Inhale 2 puffs into the lungs every 4 (four) hours as needed for Wheezing., Starting 05/01/2014, Until Discontinued, Print      azithromycin (ZITHROMAX Z-PAK) 250 MG tablet Take 1 tablet (250 mg total) by mouth daily. 2 tablets on day one, then 1 tablet on days 2 through 5, Starting 05/01/2014, Until Thu 05/07/14, Print      benzonatate (TESSALON PERLES) 100 MG capsule Take 1 capsule (100 mg total) by mouth 3 (three) times daily as needed for Cough., Starting 05/01/2014, Until Discontinued, Print      guaiFENesin-codeine (ROBITUSSIN AC) 100-10 MG/5ML syrup Take 10 mLs by mouth 3 (three) times daily as needed for Cough., Starting 05/01/2014, Until Discontinued, Print                       Sherilyn Cooter, MD  05/02/14 [phone]

## 2014-05-01 NOTE — Discharge Instructions (Signed)
Push fluids  Tylenol/motrin for pain  Stop smoking  Follow up with PMD next week  Return to the ED if any problems or concerns      Bronchitis    You have been diagnosed with bronchitis.    Bronchitis is an irritation of the large breathing tubes. It can be caused by tobacco smoke, air pollution, or an infection. Patients with bronchitis are short of breath and may cough up green or yellow mucous. These symptoms are usually worse at night, when lying flat and in wet weather. Most people with bronchitis do not need antibiotics. If your doctor prescribes antibiotics, fill the prescription and take all the medicine according to the instructions.    Bronchitis is usually treated with medicine to help stop coughing. An inhaler with albuterol (Ventolin/Proventil) is sometimes used to help with cough. It is best to use the inhaler with a spacer to help the medicine reach your lungs. The doctor can prescribe a spacer.    Bronchitis is usually caused by a virus. Antibiotics do not kill viruses. In fact, antibiotics do not affect viruses in any way. In the past, some doctors prescribed antibiotics for people with bronchitis. We now know that antibiotics are not helpful for most bronchitis patients. Patients who might need antibiotics are those with lung problems that don't go away, like emphysema or COPD.    Your coughing and wheezing might last for 2 or 3 weeks! The symptoms should get better over this time period and not worse.    Your doctor prescribed an albuterol (Ventolin/Proventil) inhaler. Use it every four hours if you are wheezing, coughing, or short of breath. This will reduce symptoms.    Do not smoke. Research shows that smoking causes heart disease, cancer, and birth defects. Avoiding smoking will help your asthma. If you smoke, ask your doctor for ideas about how to stop.   If you do not smoke, avoid others who do.    YOU SHOULD SEEK MEDICAL ATTENTION IMMEDIATELY, EITHER HERE OR AT THE NEAREST  EMERGENCY DEPARTMENT, IF ANY OF THE FOLLOWING OCCURS:   You wheeze or have trouble breathing.   You have a fever (temperature higher than 100.83F / 38C), that won't go away.   You have chest pain.   You vomit or cannot keep liquids down or you feel weak or dizzy.   Your symptoms get worse over the next 2 or 3 days.

## 2014-05-01 NOTE — ED Notes (Signed)
Patient is resting comfortably. States breathing better.

## 2014-06-30 ENCOUNTER — Ambulatory Visit (INDEPENDENT_AMBULATORY_CARE_PROVIDER_SITE_OTHER): Payer: No Typology Code available for payment source | Admitting: Internal Medicine

## 2014-06-30 ENCOUNTER — Encounter (INDEPENDENT_AMBULATORY_CARE_PROVIDER_SITE_OTHER): Payer: Self-pay | Admitting: Internal Medicine

## 2014-06-30 ENCOUNTER — Ambulatory Visit: Payer: Self-pay

## 2014-06-30 VITALS — BP 130/90 | HR 102 | Temp 97.7°F | Ht 61.81 in | Wt 177.2 lb

## 2014-06-30 DIAGNOSIS — I10 Essential (primary) hypertension: Secondary | ICD-10-CM

## 2014-06-30 DIAGNOSIS — Z0000 Encounter for general adult medical examination without abnormal findings: Secondary | ICD-10-CM

## 2014-06-30 DIAGNOSIS — M549 Dorsalgia, unspecified: Secondary | ICD-10-CM

## 2014-06-30 DIAGNOSIS — G8929 Other chronic pain: Secondary | ICD-10-CM

## 2014-06-30 DIAGNOSIS — Z23 Encounter for immunization: Secondary | ICD-10-CM

## 2014-06-30 MED ORDER — HYDROCODONE-ACETAMINOPHEN 5-325 MG PO TABS
1.0000 | ORAL_TABLET | Freq: Four times a day (QID) | ORAL | Status: DC | PRN
Start: 2014-06-30 — End: 2014-08-17

## 2014-06-30 MED ORDER — LOSARTAN POTASSIUM-HCTZ 100-12.5 MG PO TABS
1.0000 | ORAL_TABLET | Freq: Every day | ORAL | Status: DC
Start: 2014-06-30 — End: 2014-11-24

## 2014-06-30 NOTE — Progress Notes (Signed)
Has the patient saw any care outside of the [facility]? NO    Patient presented to the office for Influenza vaccine & tdap vaccine  administration.  Received injection in the Bilateral arm.  No reaction was noted and patient left in good condition

## 2014-06-30 NOTE — Progress Notes (Signed)
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 She was previous cared for by physician in [place]. She lives with father and her two children.  MRI 10 years showed bulging disc. She has low back pain with radiation to both legs. No sphincter dysfunction.  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    Hypertension   HPI  Past Surgical History   Procedure Laterality Date   . Ganglion cyst excision       Past Medical History   Diagnosis Date   . Hypertensive disorder    . Anxiety      Allergies   Allergen Reactions   . Latex Rash      Medications:   History   Substance Use Topics   . Smoking status: Current Every Day Smoker -- 0.50 packs/day for 30 years     Types: Cigarettes   . Smokeless tobacco: Not on file   . Alcohol Use: No     Family History   Problem Relation Age of Onset   . Hypertension Mother    . Stroke Mother    . Hypertension Father    . Kidney disease Brother       The following portions of the patient's history were reviewed and updated as appropriate: allergies, current medications, past family history, past medical history, past social history, past surgical history and problem list.    Review of Systems   Constitutional: Negative for unexpected weight change.        SHe climbs stairs many times per day.   HENT: Positive for dental problem and rhinorrhea. Negative for hearing loss.         Only 5 teeth left.   Eyes: Negative.    Respiratory: Negative.  Negative for cough and shortness of breath.    Cardiovascular: Negative.  Negative for chest pain, palpitations and leg swelling.        Last check cholesterol was OK.   Gastrointestinal:        Recent LFTs increased.   Genitourinary: Negative for dysuria and frequency.        Hematuria noted 6 months ago.   Musculoskeletal: Positive for back pain and arthralgias.   Skin: Negative.    Neurological: Negative.  Negative for dizziness, light-headedness and headaches.    Hematological: Negative.  Negative for adenopathy. Does not bruise/bleed easily.   Psychiatric/Behavioral: Negative for dysphoric mood and decreased concentration. The patient is nervous/anxious.            Objective:    Physical Exam  BP 130/90 mmHg  Pulse 102  Temp(Src) 97.7 F (36.5 C) (Tympanic)  Ht 1.57 m (5' 1.81")  Wt 80.377 kg (177 lb 3.2 oz)  BMI 32.61 kg/m2  Constitutional:  Alert, oriented X 3. NAD  Head    Atraumatic, normocephalic  Eye:   Conjunctival - pink, PERRL  EENT   Oropharynx is clear, good dentition, lips without lesions  Neck:   Trachea is midline, no thyromegaly  Lymph:  No cervical or supraclavicular adenopathy  Chest:   Unlabored respirations. Lungs clear to auscultation, good breath      sounds  Cardiac:  Normal S1&S2 without murmurs. No edema.  Abdomen:  Soft, nontender, no palpable masses, liver or spleen  Back   Paraspinous lumbar tenderness  Skin:   Warm, dry, no rashes in visible areas  Psych:   Normal affect, thought content  Assessment:       1. Routine general medical examination at a health care facility     2. Essential hypertension  Comprehensive Metabolic Panel   3. Chronic back pain  CBC without differential   4. Need for prophylactic vaccination against Streptococcus pneumoniae (pneumococcus) and influenza  Flu vaccine TRIVALENT > = 5 yrs preservevative IM   5. Need for diphtheria-tetanus-pertussis (Tdap) vaccine, adult/adolescent  Tdap vaccine greater than or equal to 7yo IM            Plan:      Procedures; Tdap, Influenza vaccine  Continue current medications with routine laboratory monitoring, annual influenza vaccine, seat belts, regular dental visits,regular exercise,and healthy diet.  Schedule colonoscopy.

## 2014-06-30 NOTE — Patient Instructions (Signed)
Please schedule an appointment with Gastroenterology:    [facility]  [address], [place], [place]  [phone]  [address], [place], [place]  [phone]  [email or web address]    OR    [facility]  Multiple locations ([place], [place], [place], [place])  [phone]  [email or web address]

## 2014-07-01 ENCOUNTER — Ambulatory Visit (INDEPENDENT_AMBULATORY_CARE_PROVIDER_SITE_OTHER): Payer: No Typology Code available for payment source

## 2014-07-01 ENCOUNTER — Other Ambulatory Visit (INDEPENDENT_AMBULATORY_CARE_PROVIDER_SITE_OTHER): Payer: Self-pay | Admitting: Internal Medicine

## 2014-07-01 DIAGNOSIS — Z0000 Encounter for general adult medical examination without abnormal findings: Secondary | ICD-10-CM

## 2014-07-02 LAB — CBC
Hematocrit: 46.3 % — ABNORMAL HIGH (ref 35.0–45.0)
Hemoglobin: 14.8 g/dL (ref 11.7–15.5)
MCH: 29.5 pg (ref 27–33)
MCHC: 31.9 g/dL — ABNORMAL LOW (ref 32–36)
MCV: 93 fL (ref 80–100)
MPV: 11 fL (ref 7.5–11.5)
Platelets: 224 10*3/uL (ref 140–400)
RBC: 5.01 10*6/uL (ref 3.80–5.10)
RDW: 14.4 % (ref 11.0–15.0)
WBC: 6.5 10*3/uL (ref 3.8–10.8)

## 2014-07-02 LAB — COMPREHENSIVE METABOLIC PANEL
ALT: 44 U/L — ABNORMAL HIGH (ref 6–29)
AST (SGOT): 40 U/L — ABNORMAL HIGH (ref 10–35)
Albumin/Globulin Ratio: 1.2 (ref 1.0–2.5)
Albumin: 4.3 G/DL (ref 3.6–5.1)
Alkaline Phosphatase: 97 U/L (ref 33–130)
BUN: 9 MG/DL (ref 7–25)
Bilirubin, Total: 0.4 MG/DL (ref 0.2–1.2)
CO2: 23 mmol/L (ref 19–30)
Calcium: 10 MG/DL (ref 8.6–10.4)
Chloride: 104 mmol/L (ref 98–110)
Creatinine: 0.66 mg/dL (ref 0.50–1.05)
EGFR African American: 114 mL/min/{1.73_m2} (ref >=60)
EGFR: 98 mL/min/{1.73_m2} (ref >=60)
Globulin: 3.6 G/DL (ref 1.9–3.7)
Glucose: 77 MG/DL (ref 65–99)
Potassium: 3.9 mmol/L (ref 3.5–5.3)
Protein, Total: 7.9 G/DL (ref 6.1–8.1)
Sodium: 140 mmol/L (ref 135–146)

## 2014-07-08 ENCOUNTER — Other Ambulatory Visit (INDEPENDENT_AMBULATORY_CARE_PROVIDER_SITE_OTHER): Payer: Self-pay | Admitting: Internal Medicine

## 2014-07-08 MED ORDER — ALPRAZOLAM 0.5 MG PO TABS
0.5000 mg | ORAL_TABLET | Freq: Every evening | ORAL | Status: DC | PRN
Start: 2014-07-08 — End: 2014-10-29

## 2014-07-08 NOTE — Telephone Encounter (Signed)
Call placed to inform pt RX is ready for pick up-----pt notified

## 2014-07-08 NOTE — Telephone Encounter (Signed)
Refill request for Rx ALPRAZolam (XANAX) 0.5 MG tablet. Please call back once approve [phone] (M).    Thank you.

## 2014-07-21 ENCOUNTER — Encounter (INDEPENDENT_AMBULATORY_CARE_PROVIDER_SITE_OTHER): Payer: Self-pay

## 2014-07-23 ENCOUNTER — Ambulatory Visit: Payer: Self-pay

## 2014-07-28 ENCOUNTER — Inpatient Hospital Stay
Payer: No Typology Code available for payment source | Attending: Hospice and Palliative Medicine | Admitting: Rehabilitative and Restorative Service Providers"

## 2014-07-28 ENCOUNTER — Encounter: Payer: Self-pay | Admitting: Rehabilitative and Restorative Service Providers"

## 2014-07-28 VITALS — BP 122/83 | HR 100

## 2014-07-28 DIAGNOSIS — M545 Low back pain, unspecified: Secondary | ICD-10-CM | POA: Insufficient documentation

## 2014-07-28 DIAGNOSIS — M5440 Lumbago with sciatica, unspecified side: Secondary | ICD-10-CM

## 2014-07-28 DIAGNOSIS — M542 Cervicalgia: Secondary | ICD-10-CM | POA: Insufficient documentation

## 2014-07-28 NOTE — PT/OT Therapy Note (Signed)
INITIAL EVALUATION(Spine)    Name: [name] Age: 58 y.o. Occupation: ---- SOC: 07/28/2014  Referring Physician: Eusebio Friendly, MD MD recheck: TBD DOS:  N/A DOI: Onset of Problem / Injury: 07/22/14  # of Authorized Visits:   Visit #     Diagnosis (Treating/Medical):     ICD-10-CM    1. Neck pain M54.2    2. Bilateral low back pain with sciatica, sciatica laterality unspecified M54.40         SUBJECTIVE:    Mechanism of Injury: Patient reports that she has been having back pain for many years but recently got worse, began to feel pain in both legs R>L, MD, referred to [place] for PT. Patient also reports a pain level of 7/10 in her neck and upper back which radiates down to both arms, having difficulty to reach up and lift objects.    Patient's reason for seeking PT /Goal: pain free neck and back    Past Medical History:   Past Medical History   Diagnosis Date   . Hypertensive disorder    . Anxiety    . Lower back pain    . Neck pain      Medications: No outpatient prescriptions have been marked as taking for the 07/28/14 encounter (Clinical Support) with Peyton Bottoms, PT.        Other Treatment/Prior Therapy: No  Prior Hospitalization: No  Hand Dominance: Dominant Hand: Right Involved Side: Involved Side: Bilateral   DiagnosticTests: N/A    Outcome Measure:                    NDI 20%, Oswestry 35.5% PSFS Score: 67% Rate Satisfaction with Current Function: 6/10  PLOF: able to perform ADL's without difficulty (low pain level)  Living Environment: Type of Residence: Multi-story home      Dwelling Entrance: # Steps to Enter: 4   Patient lives with: Living Arrangements: Parent, Children    OBJECTIVE:    Vitals: BP: 122/83 mmHg Heart Rate: 100      Observation/Posture/Gait/Integumentary  Observation: Increased lumbar lordosis  Posture: Kyphotic sitting posture,  Gait: decreased stance phase right, decreased heel strike right and decreased hip extension right, min-mod limping due to lack of right knee  extension  Integumentary: No wound, lesion or rash noted      AROM Shoulder   AROM Hip     AROM: Cervical Spine Pain AROM: Lumbar Spine Pain  R L  R L   Flexion 30    Flexion 39    Flexion 170 170 Flexion 83 102   Extension 30    Extension 14    Extension NT NT Extension NT NT    R L R L  R L R L Abduction 180 180 Abduction pain pain   Rotation 31 32   Rotation NT NT   Exter. Rot. 88 90 Exter. Rot.     Side Bending 29 32   Side Bending 13 16   Inter. Rot. NT NT Inter. Rot. NT NT   (blank fields were intentionally left blank)    STRENGTH   Cervical   MMT /5    IE R IE L R L  IE R IE L R  L   C1/2 Neck Flex -4/5    C7 Wrist Flex -4/5 3+/5     Neck Ext 4/5    C7 Wrist Ext -4/5 3+/5     C3 Neck Sidebend 4/5 4/5   C4 Upper Trap -  4/5 -4/5     Neck Rotation NT NT   C4 Mid Trap pain pain     Shoulder Flex 3+/5 3+/5   C4 Lower Trap pain pain     Shoulder Ext 3+/5 3+/5   Rhomboid NT NT     C5 Shld Abd 3+/5 3+/5   Serratus NT NT     C6 Biceps -4/5 -4/5          C7 Triceps 3+/5 3+/5          (blank fields were intentionally left blank)    STRENGTH   Lumbar   MMT /5    IE R IE L R L  IE R IE  L R  L   L1/2 Hip Flex 3+/5 3+/5   Glute Medius 3/5 3+/5     L3 Knee Ext 3+/5 4/5    T.A. Activation Poor      L4 Ankle DF 3+/5 4+/5   Heel/Toe Walk NT NT     L5 Toe Ex NT NT          S1 Knee Flex 3/5 4/5          (blank fields were intentionally left blank)    Palpation: TTP over L3-S1 R>L, TTP over C5-C7 and bilateral UT    Sensation to Light touch: Intact    Special Tests/Neurological Screen:   R L  R L   Deep Tendon Reflexes: Biceps 2+ = Normal 2+ = Normal Patella 2+ = Normal 2+ = Normal    Brachioradialis NT NT Achilles NT NT    Triceps NT NT Post Tib NT NT     SI Special Tests:     R L   Spurling (-) (-)   Distraction Test NT NT   Upper Limb Tension Test NT NT   Thoracic Outlet NT NT   Deep Neck Flexor Endurance NT NT   Quadrant Test NT NT      R L   Compression NT NT   Distraction NT NT   March NT NT   Sacral thrust NT NT   Sitting  PSIS heights NT NT   Standing Flexion NT NT   Supine long sit NT NT   Thigh thrust NT NT      R L   SLR (+) @ 27 (-)   Crossed SLR (-) (+) right side pain   Quadrant Test NT NT   Thomas Test NT NT   OBER Test NT NT   Slump NT NT   FABER NT NT   SAG Sign NT NT   Hamstring Flexibility maj tight Mod tight     Treatment Initial Visit:  Evaluation  Patient Education on HEP  Therapeutic exercise C/s ret in sitting x 2 sec x 10, bilateral shoulder ret without resist x 10, SKC x 10 sec x 5 each with instruction in HEP and provided patient written and illustrated handout   Therapeutic Activity: N/A  Manual: N/A  Modalities: None  Barriers to rehabilitation: None  Rehab Potential:good  Is patient aware of diagnosis: Yes    Plan of Care / Updated Plan of Care IPTC Medicare Provider #: [phone]                Patient Name: [name]  MRN: [id number]  DOI: Onset of Problem / Injury: 07/22/14 DOS: N/A  SOC: 07/28/2014    Diagnosis:     ICD-10-CM    1. Neck pain M54.2    2.  Bilateral low back pain with sciatica, sciatica laterality unspecified M54.40        ASSESSMENT: the patient is a 58 y.o. female presenting with low back pain with radiculopathy R>L along with neck and upper back pain (pain radiates down to her arms) who requires Physical Therapy for the following:  Impairments: Decreased ROM, flexibility, muscle strength, ambulation status and posture deficit    Functional Limitations: Low back: difficulty with walking, standing, going up and down steps, sitting/driving, bending forward to put on shoes/socks, sleeping,  Neck: difficulty with lifting grocery bags, reaching up and bending neck    Plan Of Care: Body Mechanics Education, Proprioceptive Activites, Electrical Stimulation, Instruction in HEP, Traction, Ultrasound, Therapeutic Exercise, Balance/Gait training and Soft Tissue/Joint Mobilization to improve mobility, flexibility and normalizing muscle tone.    Frequency/Duration: 2 times a week for 12 weeks. Anticipated  D/C date: 10/28/2014    Goals:  Date (Body Area, Impairment Goal, Functional   Activity, Target Performance) Time Frame Status Date/  Initial   07/28/2014   Improve hams flexibility to 25% in each side to enable her to walk with a proper heel-toe gait pattern x 15 minutes with pain level less than 3/10.  4 weeks     07/28/2014   Increase trunk flexion to 60 degrees to allow patient to put on shoes/socks with pain level less than 3/10.  8 weeks     07/28/2014   Improve muscle strength in the abdominal muscles to "Fair" and in LEs to 1/2 grade in all aspects to enable her to go up and down steps without difficulty or pain. 12 weeks     07/28/2014   Increase mid-trap/lower trap strength to 3+/5 to be able to lift grocery bags up to 8# without pain or difficulty  12 weeks     07/28/2014   Improve Oswestry & NDI to 10%  12 weeks     07/28/2014   Patient will demonstrate independence in prescribed HEP with proper form, sets and reps for safe discharge to an independent program.  12 weeks       Signature: Peyton Bottoms PT,MDT [place] [address] Date: 07/28/2014    Signature: Eusebio Friendly, MD ____________________________ Date:     Patient Name: [name]  MRN: [id number]      Time In/Out:  2:30 - 3:30     Total Treatment Time:  60 min

## 2014-07-29 ENCOUNTER — Other Ambulatory Visit (INDEPENDENT_AMBULATORY_CARE_PROVIDER_SITE_OTHER): Payer: Self-pay | Admitting: Internal Medicine

## 2014-07-29 NOTE — Telephone Encounter (Signed)
Patient needs a refill of traMADol (ULTRAM) 50 MG tablet.      [id number] - [place], [place] - [address] [phone] (Phone)  [phone] (Fax)     Please call patient @ [phone] when ready.

## 2014-07-30 ENCOUNTER — Inpatient Hospital Stay
Payer: No Typology Code available for payment source | Attending: Hospice and Palliative Medicine | Admitting: Rehabilitative and Restorative Service Providers"

## 2014-07-30 DIAGNOSIS — M5440 Lumbago with sciatica, unspecified side: Secondary | ICD-10-CM | POA: Insufficient documentation

## 2014-07-30 DIAGNOSIS — M542 Cervicalgia: Secondary | ICD-10-CM

## 2014-07-30 MED ORDER — TRAMADOL HCL 50 MG PO TABS
50.0000 mg | ORAL_TABLET | Freq: Four times a day (QID) | ORAL | Status: DC | PRN
Start: 2014-07-30 — End: 2014-08-24

## 2014-07-30 NOTE — Telephone Encounter (Signed)
Patient aware RX ready for pick up

## 2014-07-30 NOTE — PT/OT Exercise Plan (Signed)
Name: [name]  Referring Physician: Eusebio Friendly, MD  Diagnosis:     ICD-10-CM    1. Neck pain M54.2    2. Bilateral low back pain with sciatica, sciatica laterality unspecified M54.40         Precautions:  Hypertension Date of Surgery:  NA  MD Follow-up: TBD          Exercise Flow Sheet    Exercise Specifics 07/30/14 Date            AAROM hip flex    With ball  In supine 20 x  MF             isom hip add   With ball 5 sec  10 x  MF             isom hip abd   With belt 5 sec  10 x  MF             Pelvic tilt    10 sec  10 x  MF             DKR   With Tr Abd 10 x  MF             Glut sets    5 sec  10 x  MF                                                                                                  Home Exercise Program                 (Initials = supervised exercise by clinician)

## 2014-07-30 NOTE — PT/OT Therapy Note (Signed)
DAILY NOTE   07/30/2014     Time In/Out: 9:30 - 10:30     Total Treatment Time: 50 min Visit Number:  2    # of Authorized Visits:   Visit #:        Diagnosis (Treating/Medical):     ICD-10-CM    1. Neck pain M54.2    2. Bilateral low back pain with sciatica, sciatica laterality unspecified M54.40            Subjective:  [name] reports no issues after eval, still feels the same pain.  Functional Changes: Not yet    Objective:   Treatment:  Therapeutic Exercise: to improve: Flexibility/ROM and Stabilization   VC/TC needed for form of each exercise  Reviewed HEP    Manual Therapy:   DTM over right glut muscles and hams in SL, MFR over QL, STM over paraspinal muscles at L2-L5 R>L, longitudinal stretch over right paraspinal muscles       Current Measurements (ROM, Strength, Girth, Outcomes, etc.):         Modalities: Electrical Stimulation with Ice: Premod 15 min. Location right L3-S1 Position Seated  Therapy Rationale: Decrease Pain and Decrease Inflammation       Assessment (response to treatment):   Patient had some difficulty to get comfortable in supine, E-stim applied in sitting. No difficulty with given exercises.    Progress towards functional goals: Learning HEP    Patient requires continued skilled care to: improve ROM, flexibility and strength to achieve PT goals.    Plan:  Continue with Plan of Care    Peyton Bottoms PT,MDT [place] [address]10  07/30/2014      Frequency/Duration: 2 times a week for 12 weeks.            Anticipated D/C date: 10/28/2014    Goals:  Date  (Body Area, Impairment Goal, Functional     Activity, Target Performance)  Time Frame  Status  Date/   Initial    07/28/2014   Improve hams flexibility to 25% in each side to enable her to walk with a proper heel-toe gait pattern x 15 minutes with pain level less than 3/10.   4 weeks        07/28/2014    Increase trunk flexion to 60 degrees to allow patient to put on shoes/socks with pain level less than 3/10.   8 weeks        07/28/2014    Improve muscle  strength in the abdominal muscles to "Fair" and in LEs to 1/2 grade in all aspects to enable her to go up and down steps without difficulty or pain.  12 weeks        07/28/2014    Increase mid-trap/lower trap strength to 3+/5 to be able to lift grocery bags up to 8# without pain or difficulty   12 weeks        07/28/2014    Improve Oswestry & NDI to 10%   12 weeks        07/28/2014    Patient will demonstrate independence in prescribed HEP with proper form, sets and reps for safe discharge to an independent program.   12 weeks        Signature:       Peyton Bottoms PT,MDT [place] [address]        Date:   07/28/2014  Signature:       Eusebio Friendly, MD ____________________________          Date:

## 2014-08-05 ENCOUNTER — Inpatient Hospital Stay: Payer: No Typology Code available for payment source | Attending: Hospice and Palliative Medicine

## 2014-08-05 DIAGNOSIS — M5440 Lumbago with sciatica, unspecified side: Secondary | ICD-10-CM | POA: Insufficient documentation

## 2014-08-05 DIAGNOSIS — M542 Cervicalgia: Secondary | ICD-10-CM

## 2014-08-05 NOTE — PT/OT Exercise Plan (Signed)
Name: [name]  Referring Physician: Eusebio Friendly, MD  Diagnosis:     ICD-10-CM    1. Bilateral low back pain with sciatica, sciatica laterality unspecified M54.40    2. Neck pain M54.2         Precautions:  Hypertension Date of Surgery:  NA  MD Follow-up: TBD          Exercise Flow Sheet    Exercise Specifics 07/30/14 08-05-14            AAROM hip flex    With ball  In supine 20 x  MF x20 CG            isom hip add   With ball 5 sec  10 x  MF 10"x10 GC            isom hip abd   With belt 5 sec  10 x  MF 10"x10 CG            Pelvic tilt    10 sec  10 x  MF 10"x10 GC            DKR   With Tr Abd 10 x  MF x10 CG            Glut sets    5 sec  10 x  MF 5 "x10 CG                                                                                                 Home Exercise Program                 (Initials = supervised exercise by clinician)

## 2014-08-05 NOTE — PT/OT Therapy Note (Signed)
DAILY NOTE   08/05/2014     Time In/Out: 1040-1130    Total Treatment Time: 50 min Visit Number:  3    # of Authorized Visits: 12 Visit #: 3    Diagnosis (Treating/Medical):     ICD-10-CM    1. Bilateral low back pain with sciatica, sciatica laterality unspecified M54.40    2. Neck pain M54.2            Subjective:  Gracey reports LBP is 5-8/10. (R)knee is painful  Functional Changes: Not yet    Objective:   Treatment:  Therapeutic Exercise: to improve: Flexibility/ROM and Stabilization   VC/TC needed for form of each exercise  Reviewed HEP    Manual Therapy:   DTM over right glut muscles and hams in SL,  MFR over QL,   STM over paraspinal muscles at L2-L5 R>L, longitudinal stretch over right paraspinal muscles       Current Measurements (ROM, Strength, Girth, Outcomes, etc.):         Modalities: Electrical Stimulation with Ice: Premod 15 min. Location right L3-S1 Position supine 90/90  Therapy Rationale: Decrease Pain and Decrease Inflammation       Assessment (response to treatment):   Pain behind the knee persisted throughout the entire session today.  (R) Knee (+) for edema today.  Weak core.    Progress towards functional goals: Learning HEP    Patient requires continued skilled care to: improve ROM, flexibility and strength to achieve PT goals.    Plan:  Continue with Plan of Care    Birdena Crandall, [place]  License #[id number]    08/05/2014      Frequency/Duration: 2 times a week for 12 weeks.            Anticipated D/C date: 10/28/2014    Goals:  Date  (Body Area, Impairment Goal, Functional     Activity, Target Performance)  Time Frame  Status  Date/   Initial    07/28/2014   Improve hams flexibility to 25% in each side to enable her to walk with a proper heel-toe gait pattern x 15 minutes with pain level less than 3/10.   4 weeks        07/28/2014    Increase trunk flexion to 60 degrees to allow patient to put on shoes/socks with pain level less than 3/10.   8 weeks        07/28/2014    Improve muscle strength  in the abdominal muscles to "Fair" and in LEs to 1/2 grade in all aspects to enable her to go up and down steps without difficulty or pain.  12 weeks        07/28/2014    Increase mid-trap/lower trap strength to 3+/5 to be able to lift grocery bags up to 8# without pain or difficulty   12 weeks        07/28/2014    Improve Oswestry & NDI to 10%   12 weeks        07/28/2014    Patient will demonstrate independence in prescribed HEP with proper form, sets and reps for safe discharge to an independent program.   12 weeks        Signature:       Peyton Bottoms PT,MDT [place] [address]        Date:   07/28/2014  Signature:       Eusebio Friendly, MD ____________________________          Date:

## 2014-08-17 ENCOUNTER — Inpatient Hospital Stay: Payer: No Typology Code available for payment source

## 2014-08-17 ENCOUNTER — Ambulatory Visit (INDEPENDENT_AMBULATORY_CARE_PROVIDER_SITE_OTHER): Payer: No Typology Code available for payment source | Admitting: Family Medicine

## 2014-08-17 ENCOUNTER — Encounter (INDEPENDENT_AMBULATORY_CARE_PROVIDER_SITE_OTHER): Payer: Self-pay | Admitting: Family Medicine

## 2014-08-17 ENCOUNTER — Other Ambulatory Visit: Payer: Self-pay | Admitting: Hospice and Palliative Medicine

## 2014-08-17 VITALS — BP 141/89 | HR 95 | Temp 97.8°F | Resp 16 | Wt 180.0 lb

## 2014-08-17 DIAGNOSIS — M545 Low back pain: Secondary | ICD-10-CM

## 2014-08-17 DIAGNOSIS — M7989 Other specified soft tissue disorders: Secondary | ICD-10-CM

## 2014-08-17 DIAGNOSIS — M79604 Pain in right leg: Secondary | ICD-10-CM

## 2014-08-17 MED ORDER — HYDROCODONE-ACETAMINOPHEN 5-325 MG PO TABS
1.0000 | ORAL_TABLET | Freq: Four times a day (QID) | ORAL | Status: DC | PRN
Start: 2014-08-17 — End: 2014-12-17

## 2014-08-17 NOTE — Progress Notes (Signed)
Subjective:       Patient ID: [name] is a 58 y.o. female.    HPI Comments:   58 yo AAF here today for acute visit due to right lower extremity pain and swelling.  Ongoing several weeks.  Reduces her mobility.  Has been going to physical therapy with minimal improvement.  Saw spine doctor earlier today who advised she get worked up for DVT.  Denies chest pain, shortness of breath, jaw pain, arm pain, or diaphoresis.      The following portions of the patient's history were reviewed and updated as appropriate: allergies, current medications, past medical history and problem list.    Review of Systems   Constitutional: Negative for fever, chills and fatigue.   Respiratory: Negative for cough, shortness of breath and wheezing.    Cardiovascular: Positive for leg swelling. Negative for chest pain and palpitations.   Musculoskeletal: Positive for arthralgias.        BP 141/89 mmHg  Pulse 95  Temp(Src) 97.8 F (36.6 C) (Oral)  Wt 81.647 kg (180 lb)        Objective:    Physical Exam   Constitutional: She appears well-developed and well-nourished. No distress.   HENT:   Head: Normocephalic and atraumatic.   Cardiovascular: Normal rate, regular rhythm and normal heart sounds.    Pulmonary/Chest: Effort normal and breath sounds normal. No respiratory distress. She has no wheezes. She has no rales. She exhibits no tenderness.   Musculoskeletal:        Right knee: She exhibits decreased range of motion and swelling. Tenderness found. Medial joint line tenderness noted.        Right lower leg: She exhibits tenderness and swelling. She exhibits no edema, no deformity and no laceration.   Nursing note and vitals reviewed.          Assessment:       1. Lower extremity pain, right  US Venous Low Extrem Duplx Dopp Uni Right   2. Swelling of right lower extremity  US Venous Low Extrem Duplx Dopp Uni Right            Plan:      Procedures none    1.2.  Obtain ultrasound to assess for DVT.  Treat if positive with  anticoagulant.  Defer pain issue to spine doctor - she has MRI set up in 2 days.  Rx Norco for now, may use 2 tablets per dose.

## 2014-08-18 ENCOUNTER — Ambulatory Visit
Admission: RE | Admit: 2014-08-18 | Discharge: 2014-08-18 | Disposition: A | Discharge status: Home / Self Care | Payer: No Typology Code available for payment source | Source: Ambulatory Visit | Attending: Family Medicine | Admitting: Family Medicine

## 2014-08-18 ENCOUNTER — Telehealth: Payer: Self-pay

## 2014-08-18 ENCOUNTER — Ambulatory Visit: Payer: No Typology Code available for payment source

## 2014-08-18 DIAGNOSIS — M7989 Other specified soft tissue disorders: Secondary | ICD-10-CM

## 2014-08-18 DIAGNOSIS — M79604 Pain in right leg: Secondary | ICD-10-CM | POA: Insufficient documentation

## 2014-08-18 NOTE — Progress Notes (Signed)
[facility] Nurse Note: apt completed 07/22/14 and 08/17/14 with Dr. Ellen Henri. Patient returned phone call, we discussed her current plan of care. She expressed frustration that recently she was notified that Her scheduled MRI for tomorrow was cancelled d/t insurance. Patient stated she will follow up with her insurance company.  Patient stated she is anxious, "I cant do therapy and my right leg is swollen". MD office notified and e-mail sent for follow-up and assistance.

## 2014-08-18 NOTE — Progress Notes (Signed)
[facility] Nurse Note: Patient completed EMG, scheduled for [facility] MRI tomorrow, contact information provided. No further needs at this time.

## 2014-08-19 ENCOUNTER — Ambulatory Visit: Payer: No Typology Code available for payment source | Attending: Hospice and Palliative Medicine

## 2014-08-19 ENCOUNTER — Other Ambulatory Visit: Payer: Self-pay | Admitting: Hospice and Palliative Medicine

## 2014-08-19 ENCOUNTER — Ambulatory Visit: Payer: No Typology Code available for payment source

## 2014-08-19 DIAGNOSIS — M5416 Radiculopathy, lumbar region: Secondary | ICD-10-CM | POA: Insufficient documentation

## 2014-08-19 DIAGNOSIS — M545 Low back pain, unspecified: Secondary | ICD-10-CM

## 2014-08-19 DIAGNOSIS — G8929 Other chronic pain: Secondary | ICD-10-CM

## 2014-08-19 DIAGNOSIS — M5136 Other intervertebral disc degeneration, lumbar region: Secondary | ICD-10-CM | POA: Insufficient documentation

## 2014-08-20 ENCOUNTER — Inpatient Hospital Stay: Payer: No Typology Code available for payment source

## 2014-08-24 ENCOUNTER — Encounter (INDEPENDENT_AMBULATORY_CARE_PROVIDER_SITE_OTHER): Payer: Self-pay | Admitting: Internal Medicine

## 2014-08-24 ENCOUNTER — Ambulatory Visit (INDEPENDENT_AMBULATORY_CARE_PROVIDER_SITE_OTHER): Payer: No Typology Code available for payment source | Admitting: Internal Medicine

## 2014-08-24 VITALS — BP 144/90 | HR 91 | Temp 98.2°F | Ht 61.0 in | Wt 181.4 lb

## 2014-08-24 DIAGNOSIS — M5416 Radiculopathy, lumbar region: Secondary | ICD-10-CM

## 2014-08-24 DIAGNOSIS — R7989 Other specified abnormal findings of blood chemistry: Secondary | ICD-10-CM

## 2014-08-24 MED ORDER — TRAMADOL HCL 50 MG PO TABS
50.0000 mg | ORAL_TABLET | Freq: Four times a day (QID) | ORAL | Status: DC | PRN
Start: 2014-08-24 — End: 2014-10-29

## 2014-08-24 NOTE — Progress Notes (Signed)
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 Pain has increased recently. MRI 10 years showed bulging disc. She has low back pain with radiation to both legs; R>L. No sphincter dysfunction. She was recently seen by Dr. Ellen Henri who ordered an MRI of L/S spine. Subsequently he suggested orthopedic evaluation.  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    Hypertension - she has not been monitoring BP. She is compliant with medication.   Anxiety - she requests renewal of Xanax.    The following portions of the patient's history were reviewed and updated as appropriate: allergies, current medications and problem list.    Review of Systems  Abnormal LFTs last visit      Objective:    Physical Exam  BP 144/90 mmHg  Pulse 91  Temp(Src) 98.2 F (36.8 C) (Oral)  Ht 1.549 m (5\' 1" )  Wt 82.283 kg (181 lb 6.4 oz)  BMI 34.29 kg/m2  Constitutional:  Alert, oriented X 3. NAD  Head    Atraumatic, normocephalic  Eye:   Conjunctival - pink  Chest:   Unlabored respirations. Lungs clear to auscultation, good breath      sounds  Cardiac:  Normal S1&S2 without murmurs. No edema.  Abdomen:  Soft, nontender, no palpable masses, liver or spleen  Neuro    No weakness  Skin:   Warm, dry, no rashes in visible areas  Psych:   Normal affect, thought content        Lab Results   Component Value Date    WBC 6.5 07/01/2014    HGB 14.8 07/01/2014    HCT 46.3* 07/01/2014    PLT 224 07/01/2014    ALT 44* 07/01/2014    AST 40* 07/01/2014    NA 140 07/01/2014    K 3.9 07/01/2014    CL 104 07/01/2014    CREAT 0.66 07/01/2014    BUN 9 07/01/2014    CO2 23 07/01/2014    GLU 77 07/01/2014       Assessment:       1. Lumbar radiculopathy     2. LFTs abnormal  Comprehensive Metabolic Panel            Plan:      Procedures    Continue current medications. Check LFTS. Follow up with ortho.

## 2014-08-24 NOTE — Progress Notes (Signed)
Have you seen any new specialist outside of [place] since your last office visit?  Yes    Dr. Anselm Jungling - ordered MRI

## 2014-08-26 ENCOUNTER — Other Ambulatory Visit (FREE_STANDING_LABORATORY_FACILITY): Payer: No Typology Code available for payment source

## 2014-08-26 ENCOUNTER — Other Ambulatory Visit: Payer: Self-pay | Admitting: Orthopaedic Surgery

## 2014-08-26 DIAGNOSIS — M25561 Pain in right knee: Secondary | ICD-10-CM

## 2014-08-26 DIAGNOSIS — R7989 Other specified abnormal findings of blood chemistry: Secondary | ICD-10-CM

## 2014-08-26 LAB — COMPREHENSIVE METABOLIC PANEL
ALT: 36 U/L (ref 0–55)
AST (SGOT): 25 U/L (ref 5–34)
Albumin/Globulin Ratio: 1.1 (ref 0.9–2.2)
Albumin: 4.3 g/dL (ref 3.5–5.0)
Alkaline Phosphatase: 98 U/L (ref 37–106)
BUN: 16 mg/dL (ref 7.0–19.0)
Bilirubin, Total: 0.5 mg/dL (ref 0.1–1.2)
CO2: 26 mEq/L (ref 21–30)
Calcium: 10.5 mg/dL (ref 8.5–10.5)
Chloride: 102 mEq/L (ref 100–111)
Creatinine: 0.8 mg/dL (ref 0.4–1.5)
Globulin: 3.8 g/dL — ABNORMAL HIGH (ref 2.0–3.7)
Glucose: 99 mg/dL (ref 70–100)
Potassium: 4.4 mEq/L (ref 3.5–5.3)
Protein, Total: 8.1 g/dL (ref 6.0–8.3)
Sodium: 138 mEq/L (ref 135–146)

## 2014-08-26 LAB — GFR: EGFR: >60

## 2014-08-26 LAB — HEMOLYSIS INDEX: Hemolysis Index: 9 (ref 0–18)

## 2014-09-02 ENCOUNTER — Ambulatory Visit: Payer: No Typology Code available for payment source | Attending: Orthopaedic Surgery

## 2014-09-02 DIAGNOSIS — M23351 Other meniscus derangements, posterior horn of lateral meniscus, right knee: Secondary | ICD-10-CM | POA: Insufficient documentation

## 2014-09-02 DIAGNOSIS — M65861 Other synovitis and tenosynovitis, right lower leg: Secondary | ICD-10-CM | POA: Insufficient documentation

## 2014-09-02 DIAGNOSIS — S83241A Other tear of medial meniscus, current injury, right knee, initial encounter: Secondary | ICD-10-CM | POA: Insufficient documentation

## 2014-09-02 DIAGNOSIS — M25461 Effusion, right knee: Secondary | ICD-10-CM | POA: Insufficient documentation

## 2014-09-02 DIAGNOSIS — X58XXXA Exposure to other specified factors, initial encounter: Secondary | ICD-10-CM | POA: Insufficient documentation

## 2014-09-02 DIAGNOSIS — M25561 Pain in right knee: Secondary | ICD-10-CM | POA: Insufficient documentation

## 2014-09-17 ENCOUNTER — Ambulatory Visit
Admission: RE | Admit: 2014-09-17 | Discharge: 2014-09-17 | Disposition: A | Discharge status: Home / Self Care | Payer: No Typology Code available for payment source | Source: Ambulatory Visit | Attending: Orthopaedic Surgery | Admitting: Orthopaedic Surgery

## 2014-09-17 DIAGNOSIS — Z01818 Encounter for other preprocedural examination: Secondary | ICD-10-CM | POA: Insufficient documentation

## 2014-09-18 HISTORY — PX: COLONOSCOPY, DIAGNOSTIC (SCREENING): SHX174

## 2014-09-18 LAB — ECG 12-LEAD
Atrial Rate: 88 {beats}/min
P Axis: 63 degrees
P-R Interval: 152 ms
Q-T Interval: 360 ms
QRS Duration: 78 ms
QTC Calculation (Bezet): 435 ms
R Axis: 56 degrees
T Axis: 39 degrees
Ventricular Rate: 88 {beats}/min

## 2014-09-24 ENCOUNTER — Other Ambulatory Visit: Payer: Self-pay

## 2014-09-24 HISTORY — PX: KNEE ARTHROSCOPY: SUR90

## 2014-09-24 MED ORDER — OXYCODONE-ACETAMINOPHEN 5-325 MG PO TABS
1.0000 | ORAL_TABLET | ORAL | 0 refills | Status: DC | PRN
Start: 2014-09-24 — End: 2014-10-15
  Filled 2014-09-24: qty 40, 4d supply, fill #0

## 2014-10-05 ENCOUNTER — Inpatient Hospital Stay
Payer: No Typology Code available for payment source | Attending: Orthopaedic Surgery | Admitting: Rehabilitative and Restorative Service Providers"

## 2014-10-05 ENCOUNTER — Encounter: Payer: Self-pay | Admitting: Rehabilitative and Restorative Service Providers"

## 2014-10-05 VITALS — BP 113/90 | HR 94

## 2014-10-05 DIAGNOSIS — X58XXXD Exposure to other specified factors, subsequent encounter: Secondary | ICD-10-CM | POA: Insufficient documentation

## 2014-10-05 DIAGNOSIS — S83241D Other tear of medial meniscus, current injury, right knee, subsequent encounter: Secondary | ICD-10-CM | POA: Insufficient documentation

## 2014-10-05 DIAGNOSIS — M25561 Pain in right knee: Secondary | ICD-10-CM | POA: Insufficient documentation

## 2014-10-05 DIAGNOSIS — R262 Difficulty in walking, not elsewhere classified: Secondary | ICD-10-CM | POA: Insufficient documentation

## 2014-10-05 DIAGNOSIS — S83281D Other tear of lateral meniscus, current injury, right knee, subsequent encounter: Secondary | ICD-10-CM | POA: Insufficient documentation

## 2014-10-05 NOTE — PT/OT Therapy Note (Signed)
INITIAL EVALUATION (Knee)    Name: [name] Age: 59 y.o. Occupation:  Residential Counsellor  SOC: 10/05/2014  Referring Physician: Cynda Familia, MD MD recheck: TBDDOS: Date of Surgery: 09/24/14 DOI: Onset of Problem / Injury: 09/24/14  # of Authorized Visits:   Visit #      Diagnosis (Treating/Medical):     ICD-10-CM    1. Difficulty in walking involving joint R26.2    2. Right knee pain M25.561         SUBJECTIVE:    Mechanism of Injury: Dec 2015- right knee pain / swelling. Was getting PT treatment for lumbar sciatica at that time. Otherwise- no specific event. MRI of knee revealed meniscus tear- lateral and medial. Arthroscopic surgery for meniscal tear and cleaning of OA.    Patient's reason for seeking PT /Goal: Decreased pain and improved ADl's like walking, stairs      Past Medical History:   Past Medical History   Diagnosis Date   . Hypertensive disorder    . Anxiety    . Lower back pain    . Neck pain      Medications: No outpatient prescriptions have been marked as taking for the 10/05/14 encounter (Clinical Support) with Erskine Squibb, PT.   Fall Risks: Low Fall Risk Comments: none 2015  Other Treatment/Prior Therapy: Yes for LB  Prior Hospitalization: None overnight    Hand Dominance: Dominant Hand: Right Involved Side: Involved Side: Right   DiagnosticTests: MRI knee in chart    Outcome Measure:   LEFS 51% PSFS Score: 57% Rate Satisfaction with Current Function: 3/10  PLOF:  No limitation with sit to stand, walking - unlimited distance without single point cane, reciprocal stairs, able to fall sleep without disturbance.      Living Environment: Type of Residence: Multi-story home      Dwelling Entrance:     Patient lives with: Living Arrangements: Parent    OBJECTIVE:    Vitals: BP: 113/90 mmHg Heart Rate: 94      Observation/Posture/Gait/Integumentary:  Observation of posture:   Ambulation: without AD  Gait: decreased heel strike right and decreased TKE right  Functional Strength:   Sit to  Stand: UE Assist  Squat:  Partial  Step-up: 4 inches    Balance:  SLS R: Eyes Open (EO): 7sec. Eyes Closed (EC):15 sec.  SLS L: Eyes Open (EO): sec. Eyes Closed (EC): Sec.    Range of Motion: (degrees)  Initial Right  AROM InitialRight  PROM   Right  AROM   Right PROM Knee InitialLeft AROM InitialLeft PROM   Left AROM   Left PROM   90    Flexion 130      -7    Extension 0      (blank fields were intentionally left blank)    Hip AROM: WFL  Ankle AROM: WFL    Initial   R   R LE Strength  MMT /5 Initial  L    L   3/5  Hip Flexion 4-/5      Hip Extension     4/5  Hip Abduction 4/5      Hip Adduction     3+/5  Quadriceps 4-/5    3/5  Hamstrings 4-/5    4+/5  Ankle Dorsiflexion 4+/5    4+/5  Big toe extension 4+/5    (blank fields were intentionally left blank)    Girth/Edema:    Initial R  Initial L   Suprapatellar 46.5  46.5   Mid Patellar 40  38   Infrapatellar 37  37   10 cm. Prox.      10 cm. Dist.      (blank fields were intentionally left blank)    Integumentary: Open Incision/Wound Present- medial/ inferior incision with bloody drainage/ no pus; mild ecchymosis of medial knee  Palpation: Pain to palpation: around knee cap/ quad muscle- tendon/ patellar tendon; medial and lateral knee joint   Patellar Mobility: hypomobile Direction: superior, inferior, medial and lateral    Flexibility:    Comment:   Hamstrings Restricted Right    Quadriceps Restricted Right    Piriformis NT    ITBand Restricted Right    Iliopsoas NT    Gastroc Restricted Right        Special Tests/Neurological Screen:  NT s/p surgery      Sensation to Light touch: Intact    Treatment Initial Visit:  Evaluation (30 min)  Patient Education on The patient is advised to apply ice or cold packs intermittently as needed to relieve pain.    Therapeutic exercise with instruction in HEP and provided patient written and illustrated handout Yes  Therapeutic Activity: N/A  Manual: N/A  Modalities: None  Barriers to rehabilitation: None  Rehab  Potential:good  Is patient aware of diagnosis: Yes  For Next Visit Add e -stem    Erskine Squibb, PT, DPT, [place] 0630  10/05/2014    Time In/Out:  1:45 - 2:35 Total Treatment Time:  27'

## 2014-10-05 NOTE — PT/OT Plan of Care (Signed)
Plan of Care / Updated Plan of Care IPTC Medicare Provider #: [id number]                Patient Name: [name]  MRN: [id number]  DOI: Onset of Problem / Injury: 09/24/14 DOS: Date of Surgery: 09/24/14 SOC: 10/05/2014    Diagnosis:     ICD-10-CM    1. Difficulty in walking involving joint R26.2    2. Right knee pain M25.561        ASSESSMENT: the patient is a 59 y.o. female presenting with knee pain who requires Physical Therapy for the following:  Impairments:  Increased right knee pain, decreased knee AROM flexion/ extension, decreased knee /hip strength, decreased gait s/p right knee arthroscopy.      Functional Limitations: Right knee pain. Hard to find comfortable knee position for sleep. Ambulation community limited 15 to 20 minutes. Stairs one step at a time with railing.    Plan Of Care: Electrical Stimulation, Instruction in HEP, Therapeutic Exercise, Balance/Gait training and Soft Tissue/Joint Mobilization-Retrograde massage to decrease edema/ patella joint mob      Frequency/Duration: 2 times a week for 8 weeks. Anticipated D/C date: 01/01/15    Goals:  Date (Body Area, Impairment Goal, Functional   Activity, Target Performance) Time Frame Status Date/  Initial   10/05/2014   Decreased right knee pain by 50% for the patient able to fall asleep without disturbance.  6 weeks Initial Eval    10/05/2014  Increased right knee extension AROM to 0 deg and knee strength 4-/5 for the patient able to walk community distance with decreased pain 50% for 45 minutes and with heel toe ambulation for normal/stable gait pattern.      6 weeks Initial Eval    10/05/2014  Increased right knee flexion AROM 120 deg and knee strength 4/5 for the patient able go up the stairs reciprocally.  6 weeks Initial Eval    10/05/2014   Patient will demonstrate independence in prescribed HEP with proper form, sets and reps for safe discharge to an independent program.   6 weeks Initial Eval    10/05/2014   Improve LEFS to 60% to demonstrate  effectiveness of PT treatment.    6 weeks Initial Eval      Signature: Erskine Squibb, PT, DPT, [place] [address] Date: 10/05/2014    Signature: Cynda Familia, MD ____________________________ Date:     Patient Name: [name]  MRN: [id number]

## 2014-10-07 ENCOUNTER — Inpatient Hospital Stay
Payer: No Typology Code available for payment source | Attending: Orthopaedic Surgery | Admitting: Rehabilitative and Restorative Service Providers"

## 2014-10-07 DIAGNOSIS — R262 Difficulty in walking, not elsewhere classified: Secondary | ICD-10-CM | POA: Insufficient documentation

## 2014-10-07 DIAGNOSIS — S83241D Other tear of medial meniscus, current injury, right knee, subsequent encounter: Secondary | ICD-10-CM | POA: Insufficient documentation

## 2014-10-07 DIAGNOSIS — M25561 Pain in right knee: Secondary | ICD-10-CM

## 2014-10-07 DIAGNOSIS — S83281D Other tear of lateral meniscus, current injury, right knee, subsequent encounter: Secondary | ICD-10-CM | POA: Insufficient documentation

## 2014-10-07 DIAGNOSIS — X58XXXD Exposure to other specified factors, subsequent encounter: Secondary | ICD-10-CM | POA: Insufficient documentation

## 2014-10-07 NOTE — PT/OT Exercise Plan (Signed)
Name: [name]  Referring Physician: Cynda Familia, MD  Diagnosis:     ICD-10-CM    1. Difficulty in walking involving joint R26.2    2. Right knee pain M25.561         Precautions:  Hypertension               Date of Surgery:  09/24/14  MD Follow-up: TBD          Exercise Flow Sheet    Exercise Specifics 10/07/14 Date            Quad sets    5 sec  10 x  MF             AAROM knee flex   With ball in supine 2 min  MF             SLR    10 x  MF             Hip abd in supine   With blue TB 15 x  MF             LAQ   Without weight 3 sec  10 x  MF             Bike    6 min  MF             Calf stretch   On balance board 30 sec  3 x   MF             On balance board   bilat  Fw-Bw 15 x  MF                                                                Home Exercise Program                 (Initials = supervised exercise by clinician)

## 2014-10-07 NOTE — PT/OT Therapy Note (Signed)
DAILY NOTE   10/07/2014     Time In/Out: 5:50 - 6:50      Total Treatment Time: 53 min Visit Number:  2    # of Authorized Visits: 12 Visit #: 2      Diagnosis (Treating/Medical):     ICD-10-CM    1. Difficulty in walking involving joint R26.2    2. Right knee pain M25.561            Subjective:  Iola reports no issues after evaluation. She also states that she had a fall on the left knee yesterday while going down the steps (missed 2 steps),   Functional Changes: Not yet    Objective:   Treatment:  Therapeutic Exercise: to improve: Balance, Flexibility/ROM, Stabilization and Strength   VC/TC needed for form of each exercise  Reviewed HEP    NMR: Rocker board w/ vc & tactile cues to facilitate TrA, VMO/quad & glut recruitment & foot/ankle muscle co-contractions to decrease LOB during gait.    Therapeutic Activities:  N/A    Manual Therapy:   STM over mid and distal aspects of right thigh, post right knee and right calf muscle, retrograde massage, right patella mobs, manual stretching right knee flex and ext at patient tolerance      Current Measurements (ROM, Strength, Girth, Outcomes, etc.):     AROM right knss:  Flex 103 deg  Ext -5 deg    Modalities: Electrical Stimulation with Ice: Premod 15 min. Location med/lat aspects of right knee Position Supine  Therapy Rationale: Decrease Pain, Decrease Inflammation and Decrease Edema       Assessment (response to treatment):   Patient demonstrated no produced symptoms with given treatment today.    Progress towards functional goals: Learning HEP    Patient requires continued skilled care to: improve ROM, flexibility and muscle strength to achieve PT goals.    Plan:  Continue with Plan of Care    Peyton Bottoms PT,MDT [place] [address]29  10/07/2014      Frequency/Duration: 2 times a week for 8 weeks.  Anticipated D/C date: 01/01/15    Goals:  Date  (Body Area, Impairment Goal, Functional     Activity, Target Performance)  Time Frame  Status  Date/   Initial    10/05/2014   Decreased  right knee pain by 50% for the patient able to fall asleep without disturbance.   6 weeks  Initial Eval      10/05/2014  Increased right knee extension AROM to 0 deg and knee strength 4-/5 for the patient able to walk community distance with decreased pain 50% for 45 minutes and with heel toe ambulation for normal/stable gait pattern.        6 weeks  Initial Eval      10/05/2014   Increased right knee flexion AROM 120 deg and knee strength 4/5 for the patient able go up the stairs reciprocally.   6 weeks  Initial Eval      10/05/2014    Patient will demonstrate independence in prescribed HEP with proper form, sets and reps for safe discharge to an independent program.    6 weeks  Initial Eval      10/05/2014    Improve LEFS to 60% to demonstrate effectiveness of PT treatment.      6 weeks  Initial Eval      Signature:       Erskine Squibb, PT, DPT, [place] [address]21  Date:   10/05/2014  Signature:       Cynda Familia, MD ____________________________ Date:

## 2014-10-12 ENCOUNTER — Inpatient Hospital Stay: Payer: No Typology Code available for payment source

## 2014-10-15 ENCOUNTER — Other Ambulatory Visit (INDEPENDENT_AMBULATORY_CARE_PROVIDER_SITE_OTHER): Payer: Self-pay | Admitting: Internal Medicine

## 2014-10-15 MED ORDER — OXYCODONE-ACETAMINOPHEN 5-325 MG PO TABS
1.0000 | ORAL_TABLET | ORAL | Status: DC | PRN
Start: 2014-10-15 — End: 2014-12-29

## 2014-10-15 MED ORDER — OXYCODONE-ACETAMINOPHEN 5-325 MG PO TABS
1.0000 | ORAL_TABLET | ORAL | Status: DC | PRN
Start: 2014-10-15 — End: 2014-10-15

## 2014-10-15 NOTE — Telephone Encounter (Signed)
Patient will pick up Rx tomorrow 

## 2014-10-15 NOTE — Telephone Encounter (Signed)
Pt needs refill Rx oxyCODONE-acetaminophen (PERCOCET) 5-325 MG per tablet, please contact pt back [phone] (M); [phone] (W).    Thank you.

## 2014-10-15 NOTE — Telephone Encounter (Signed)
1 refill printed.  She will have to pick up

## 2014-10-16 ENCOUNTER — Inpatient Hospital Stay: Payer: No Typology Code available for payment source

## 2014-10-19 ENCOUNTER — Inpatient Hospital Stay: Payer: No Typology Code available for payment source

## 2014-10-21 ENCOUNTER — Inpatient Hospital Stay: Payer: No Typology Code available for payment source

## 2014-10-21 DIAGNOSIS — Z029 Encounter for administrative examinations, unspecified: Secondary | ICD-10-CM | POA: Insufficient documentation

## 2014-10-26 ENCOUNTER — Inpatient Hospital Stay: Payer: No Typology Code available for payment source

## 2014-10-28 ENCOUNTER — Inpatient Hospital Stay: Payer: No Typology Code available for payment source | Admitting: Physical Therapist

## 2014-10-29 ENCOUNTER — Other Ambulatory Visit (INDEPENDENT_AMBULATORY_CARE_PROVIDER_SITE_OTHER): Payer: Self-pay | Admitting: Internal Medicine

## 2014-10-29 NOTE — Telephone Encounter (Signed)
Please [facility] two [facility] has been completely out of both:    1. Tramadol 2. Alprazolam    If you have any questions for the pt. you may call her at [phone]    Thank you, MCL

## 2014-10-30 MED ORDER — TRAMADOL HCL 50 MG PO TABS
50.0000 mg | ORAL_TABLET | Freq: Four times a day (QID) | ORAL | Status: DC | PRN
Start: 2014-10-30 — End: 2014-12-17

## 2014-10-30 MED ORDER — ALPRAZOLAM 0.5 MG PO TABS
0.5000 mg | ORAL_TABLET | Freq: Every evening | ORAL | Status: DC | PRN
Start: 2014-10-30 — End: 2015-02-17

## 2014-11-02 NOTE — Telephone Encounter (Signed)
[facility]. Pt notified

## 2014-11-12 ENCOUNTER — Encounter (INDEPENDENT_AMBULATORY_CARE_PROVIDER_SITE_OTHER): Payer: Self-pay

## 2014-11-24 ENCOUNTER — Other Ambulatory Visit (INDEPENDENT_AMBULATORY_CARE_PROVIDER_SITE_OTHER): Payer: Self-pay

## 2014-11-24 MED ORDER — LOSARTAN POTASSIUM-HCTZ 100-12.5 MG PO TABS
1.0000 | ORAL_TABLET | Freq: Every day | ORAL | Status: DC
Start: 2014-11-24 — End: 2015-02-17

## 2014-12-17 ENCOUNTER — Other Ambulatory Visit (INDEPENDENT_AMBULATORY_CARE_PROVIDER_SITE_OTHER): Payer: Self-pay | Admitting: Internal Medicine

## 2014-12-17 ENCOUNTER — Ambulatory Visit: Payer: Self-pay

## 2014-12-17 NOTE — Telephone Encounter (Signed)
Pt. Needs refills on   HYDROcodone-acetaminophen (NORCO) 5-325 MG per tablet  And traMADol (ULTRAM) 50 MG tablet    Call pt. At [phone] (M) to tell her when this is/these are ready to be picked up.    Otherwise her pharmacy is still:   [id number] - [place], [place] - [address] [phone] (Phone)  [phone] (Fax)     Thanks, MCL

## 2014-12-18 ENCOUNTER — Telehealth (INDEPENDENT_AMBULATORY_CARE_PROVIDER_SITE_OTHER): Payer: Self-pay

## 2014-12-18 ENCOUNTER — Ambulatory Visit: Payer: No Typology Code available for payment source

## 2014-12-18 MED ORDER — HYDROCODONE-ACETAMINOPHEN 5-325 MG PO TABS
1.0000 | ORAL_TABLET | Freq: Four times a day (QID) | ORAL | Status: DC | PRN
Start: 2014-12-18 — End: 2015-02-17

## 2014-12-18 MED ORDER — TRAMADOL HCL 50 MG PO TABS
50.0000 mg | ORAL_TABLET | Freq: Four times a day (QID) | ORAL | Status: DC | PRN
Start: 2014-12-18 — End: 2015-02-17

## 2014-12-18 NOTE — Telephone Encounter (Signed)
Patient is aware regarding Rx script ready for pick-up.

## 2014-12-18 NOTE — Telephone Encounter (Signed)
Office visit before next refill

## 2014-12-28 ENCOUNTER — Ambulatory Visit (INDEPENDENT_AMBULATORY_CARE_PROVIDER_SITE_OTHER): Payer: No Typology Code available for payment source | Admitting: Internal Medicine

## 2014-12-29 ENCOUNTER — Ambulatory Visit (INDEPENDENT_AMBULATORY_CARE_PROVIDER_SITE_OTHER): Payer: No Typology Code available for payment source | Admitting: Internal Medicine

## 2014-12-29 ENCOUNTER — Encounter (INDEPENDENT_AMBULATORY_CARE_PROVIDER_SITE_OTHER): Payer: Self-pay | Admitting: Internal Medicine

## 2014-12-29 VITALS — BP 121/82 | HR 106 | Temp 98.3°F | Wt 186.2 lb

## 2014-12-29 DIAGNOSIS — M5440 Lumbago with sciatica, unspecified side: Secondary | ICD-10-CM

## 2014-12-29 DIAGNOSIS — F419 Anxiety disorder, unspecified: Secondary | ICD-10-CM

## 2014-12-29 DIAGNOSIS — I10 Essential (primary) hypertension: Secondary | ICD-10-CM

## 2014-12-29 NOTE — Progress Notes (Signed)
Have you seen any other specialist outside of [place]-No

## 2014-12-29 NOTE — Progress Notes (Signed)
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  HPI  LPB - she has had longstanding LBP which has persisted despite PT, epidural steroid injections. Recent MRI 10 years showed degenerative changes.. She has low back pain with radiation to both legs; R>L. No sphincter dysfunction. She uses Tramadol and occasional Norco for relief. She plans to start swimming soon.  Arthritis of knee - she had recnt knee surgery   Hypertension - she has not been monitoring BP. She is compliant with medication.   Anxiety - she requests renewal of Xanax.    The following portions of the patient's history were reviewed and updated as appropriate: allergies, current medications and problem list.    Review of Systems  NO chest pain, SOB, edema, headache.      Objective:    Physical Exam  BP 121/82 mmHg  Pulse 106  Temp(Src) 98.3 F (36.8 C) (Oral)  Wt 84.46 kg (186 lb 3.2 oz)  Constitutional:  Alert, oriented X 3. NAD  Head    Atraumatic, normocephalic  Eye:   Conjunctival - pink  Chest:   Unlabored respirations. Lungs clear to auscultation, good breath sounds  Cardiac:  Normal S1&S2 without murmurs. No edema.  Back   No CVA or spinous tenderness  Skin:   Warm, dry, no rashes in visible areas  Psych:   Normal affect, thought content      MRI of L/S spine -  overall mild degenerative changes of the lumbar spine as described without significant spinal canal stenosis. There is mildneural foraminal narrowing at L3-L4 due to disc bulging, endplate  osteophytic spurring, grade 1 anterolisthesis and facet hypertrophy.  Assessment:        1. Essential hypertension     2. Bilateral low back pain with sciatica, sciatica laterality unspecified     3. Anxiety              Plan:      Procedures    Continue current medications with routine laboratory monitoring, annual influenza vaccine,regular exercise,and healthy diet.

## 2015-01-04 ENCOUNTER — Ambulatory Visit: Payer: No Typology Code available for payment source

## 2015-01-04 NOTE — Progress Notes (Signed)
[facility] Nurse Navigator Note: Left message to complete outcomes and satisfaction survey.

## 2015-01-14 ENCOUNTER — Ambulatory Visit: Payer: No Typology Code available for payment source

## 2015-02-01 ENCOUNTER — Telehealth (INDEPENDENT_AMBULATORY_CARE_PROVIDER_SITE_OTHER): Payer: Self-pay | Admitting: Internal Medicine

## 2015-02-01 NOTE — Telephone Encounter (Signed)
Pt. Left message to ask if Dr. Jerolyn Center would like to see pt. Or just write another referral for pt. To go back to her orthopaedic dr. For her recently swelled up knee.   Call pt. [phone] to let her know.    Thanks, MCL

## 2015-02-01 NOTE — Telephone Encounter (Signed)
Please advise! Thx! 

## 2015-02-02 ENCOUNTER — Telehealth (INDEPENDENT_AMBULATORY_CARE_PROVIDER_SITE_OTHER): Payer: Self-pay | Admitting: Internal Medicine

## 2015-02-02 ENCOUNTER — Other Ambulatory Visit (INDEPENDENT_AMBULATORY_CARE_PROVIDER_SITE_OTHER): Payer: Self-pay | Admitting: Internal Medicine

## 2015-02-02 DIAGNOSIS — M25569 Pain in unspecified knee: Secondary | ICD-10-CM

## 2015-02-02 NOTE — Telephone Encounter (Signed)
referral written

## 2015-02-02 NOTE — Telephone Encounter (Signed)
Pt. Called again about her swollen knees. (see 02/01/15 encounter)

## 2015-02-03 NOTE — Telephone Encounter (Signed)
Pt notified that refill was written. Pt will pick up the referral today.

## 2015-02-05 ENCOUNTER — Other Ambulatory Visit: Payer: Self-pay | Admitting: Orthopaedic Surgery

## 2015-02-05 DIAGNOSIS — M25562 Pain in left knee: Secondary | ICD-10-CM

## 2015-02-12 ENCOUNTER — Ambulatory Visit: Payer: No Typology Code available for payment source | Attending: Orthopaedic Surgery

## 2015-02-12 DIAGNOSIS — M660 Rupture of popliteal cyst: Secondary | ICD-10-CM | POA: Insufficient documentation

## 2015-02-12 DIAGNOSIS — S83242A Other tear of medial meniscus, current injury, left knee, initial encounter: Secondary | ICD-10-CM | POA: Insufficient documentation

## 2015-02-12 DIAGNOSIS — M94262 Chondromalacia, left knee: Secondary | ICD-10-CM | POA: Insufficient documentation

## 2015-02-12 DIAGNOSIS — M25462 Effusion, left knee: Secondary | ICD-10-CM | POA: Insufficient documentation

## 2015-02-12 DIAGNOSIS — M25562 Pain in left knee: Secondary | ICD-10-CM | POA: Insufficient documentation

## 2015-02-12 DIAGNOSIS — X58XXXA Exposure to other specified factors, initial encounter: Secondary | ICD-10-CM | POA: Insufficient documentation

## 2015-02-17 ENCOUNTER — Other Ambulatory Visit (INDEPENDENT_AMBULATORY_CARE_PROVIDER_SITE_OTHER): Payer: Self-pay | Admitting: Internal Medicine

## 2015-02-17 NOTE — Telephone Encounter (Signed)
Patient is requesting refills for:    ALPRAZolam (XANAX) 0.5 MG tablet  HYDROcodone-acetaminophen (NORCO) 5-325 MG per tablet   losartan-hydrochlorothiazide (HYZAAR) 100-12.5 MG per tablet   traMADol (ULTRAM) 50 MG tablet           Pharmacy is:      [id number] - [place], [place] - [address] [phone] (Phone)  [phone] (Fax)           Please call the patient @ [phone] when ready for pickup or to let her know they have been called in.

## 2015-02-18 MED ORDER — LOSARTAN POTASSIUM-HCTZ 100-12.5 MG PO TABS
1.0000 | ORAL_TABLET | Freq: Every day | ORAL | Status: DC
Start: 2015-02-18 — End: 2015-05-17

## 2015-02-18 MED ORDER — ALPRAZOLAM 0.5 MG PO TABS
0.5000 mg | ORAL_TABLET | Freq: Every evening | ORAL | Status: DC | PRN
Start: 2015-02-18 — End: 2015-06-20

## 2015-02-18 MED ORDER — TRAMADOL HCL 50 MG PO TABS
50.0000 mg | ORAL_TABLET | Freq: Four times a day (QID) | ORAL | Status: DC | PRN
Start: 2015-02-18 — End: 2015-04-05

## 2015-02-18 MED ORDER — HYDROCODONE-ACETAMINOPHEN 5-325 MG PO TABS
1.0000 | ORAL_TABLET | Freq: Four times a day (QID) | ORAL | Status: DC | PRN
Start: 2015-02-18 — End: 2015-05-04

## 2015-02-18 NOTE — Telephone Encounter (Signed)
Pt notified that refill rx will be at the front desk for pick up. Appt scheduled for 03/09/15

## 2015-02-18 NOTE — Telephone Encounter (Signed)
She needs office before next refill of narcotic

## 2015-03-09 ENCOUNTER — Encounter (INDEPENDENT_AMBULATORY_CARE_PROVIDER_SITE_OTHER): Payer: Self-pay | Admitting: Internal Medicine

## 2015-03-09 ENCOUNTER — Ambulatory Visit (INDEPENDENT_AMBULATORY_CARE_PROVIDER_SITE_OTHER): Payer: No Typology Code available for payment source | Admitting: Internal Medicine

## 2015-03-09 VITALS — BP 101/72 | HR 96 | Temp 97.5°F | Wt 185.0 lb

## 2015-03-09 DIAGNOSIS — M5440 Lumbago with sciatica, unspecified side: Secondary | ICD-10-CM

## 2015-03-09 DIAGNOSIS — Z1239 Encounter for other screening for malignant neoplasm of breast: Secondary | ICD-10-CM

## 2015-03-09 DIAGNOSIS — M170 Bilateral primary osteoarthritis of knee: Secondary | ICD-10-CM

## 2015-03-09 DIAGNOSIS — F419 Anxiety disorder, unspecified: Secondary | ICD-10-CM

## 2015-03-09 DIAGNOSIS — I10 Essential (primary) hypertension: Secondary | ICD-10-CM

## 2015-03-09 NOTE — Progress Notes (Signed)
Has the patient sought any care outside of the [facility]? NO   -Refer to care team-   Or   List Specialists:    Orthopedic  Dr. Rayvon Char    Pt aware of due pap and mammogram

## 2015-03-09 NOTE — Progress Notes (Signed)
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    HPI  LPB - she has had longstanding LBP which has persisted despite PT, epidural steroid injections.  She has low back pain with radiation to both legs; R>L. No sphincter dysfunction. She uses Tramadol BID and occasional Norco for relief. She has started swimming twice per week.Marland Kitchen MRI of L/S spine -  overall mild degenerative changes of the lumbar spine without significant spinal canal stenosis. There is mild neural foraminal narrowing at L3-L4 due to disc bulging, endplate osteophytic spurring, grade 1 anterolisthesis and facet hypertrophy.  Arthritis of knee - she had recent R knee surgery by Dr. Rayvon Char. He plans arthroscopic surgery for left knee.   Hypertension - she has not been monitoring BP. She is compliant with medication.   Anxiety -  She takes Xanax. nightly.    Medication Sig   . ALPRAZolam (XANAX) 0.5 MG tablet Take 1 tablet (0.5 mg total) by mouth nightly as needed.   . betamethasone dipropionate (DIPROLENE) 0.05 % cream Apply topically 2 (two) times daily.   Marland Kitchen HYDROcodone-acetaminophen (NORCO) 5-325 MG per tablet Take 1-2 tablets by mouth every 6 (six) hours as needed for Pain.  Earliest Fill Date: 02/18/15   . losartan-hydrochlorothiazide (HYZAAR) 100-12.5 MG per tablet Take 1 tablet by mouth daily.   . traMADol (ULTRAM) 50 MG tablet Take 1 tablet (50 mg total) by mouth every 6 (six) hours as needed.     The following portions of the patient's history were reviewed and updated as appropriate: allergies, current medications and problem list.    Review of Systems        Objective:    Physical Exam  BP 101/72 mmHg  Pulse 96  Temp(Src) 97.5 F (36.4 C) (Tympanic)  Wt 83.915 kg (185 lb)  Constitutional:  Alert, oriented X 3. NAD  Head    Atraumatic, normocephalic  Eye:   Conjunctival - pink  Chest:   Unlabored respirations. Lungs clear to auscultation, good breath sounds  Cardiac:  Normal  S1&S2 without murmurs. No edema.  Abdomen:  Soft, nontender, no palpable masses, liver or spleen  Back   No CVA or spinous tenderness Motor 5/5  Skin:   Warm, dry, no rashes in visible areas  Psych:   Normal affect, thought content        Assessment:       1. Essential hypertension     2. Bilateral low back pain with sciatica, sciatica laterality unspecified     3. Anxiety     4. Primary osteoarthritis of both knees     5. Breast cancer screening  Mammo Digital Screening Bilateral W Cad            Plan:      Procedures    Continue current medications with routine laboratory monitoring, annual influenza vaccine, regular exercise,and healthy diet.  Screening mammogram.

## 2015-03-11 ENCOUNTER — Ambulatory Visit (INDEPENDENT_AMBULATORY_CARE_PROVIDER_SITE_OTHER): Payer: No Typology Code available for payment source | Admitting: Internal Medicine

## 2015-03-11 ENCOUNTER — Encounter (INDEPENDENT_AMBULATORY_CARE_PROVIDER_SITE_OTHER): Payer: Self-pay | Admitting: Internal Medicine

## 2015-03-11 VITALS — BP 102/70 | HR 116 | Temp 97.0°F | Ht 60.24 in | Wt 184.8 lb

## 2015-03-11 DIAGNOSIS — S83207D Unspecified tear of unspecified meniscus, current injury, left knee, subsequent encounter: Secondary | ICD-10-CM

## 2015-03-11 DIAGNOSIS — X58XXXD Exposure to other specified factors, subsequent encounter: Secondary | ICD-10-CM

## 2015-03-11 DIAGNOSIS — Z01818 Encounter for other preprocedural examination: Secondary | ICD-10-CM

## 2015-03-11 NOTE — Progress Notes (Signed)
Have you seen any other specialist outside of [place]-No

## 2015-03-11 NOTE — Progress Notes (Signed)
Subjective:       Patient ID: [name] is a 59 y.o. female seen prior to left knee arthoscopy by Dr. Rayvon Char at [facility] on July 7.    HPI  She has longstanding hypertension which has been well controlled with current medication. She denies exertional chest pain, SOB, palpitations. She has no abnormal bleeding.    Past Surgical History   Procedure Laterality Date   . Ganglion cyst excision     . Knee arthroscopy Right 09/24/2014     Past Medical History   Diagnosis Date   . Hypertensive disorder    . Anxiety    . Lower back pain    . Neck pain      Allergies   Allergen Reactions   . Latex Rash     Medication Sig   . ALPRAZolam (XANAX) 0.5 MG tablet Take 1 tablet (0.5 mg total) by mouth nightly as needed.   . betamethasone dipropionate (DIPROLENE) 0.05 % cream Apply topically 2 (two) times daily.   Marland Kitchen HYDROcodone-acetaminophen (NORCO) 5-325 MG per tablet Take 1-2 tablets by mouth every 6 (six) hours as needed for Pain.  Earliest Fill Date: 02/18/15   . losartan-hydrochlorothiazide (HYZAAR) 100-12.5 MG per tablet Take 1 tablet by mouth daily.   . traMADol (ULTRAM) 50 MG tablet Take 1 tablet (50 mg total) by mouth every 6 (six) hours as needed.       History   Substance Use Topics   . Smoking status: Current Every Day Smoker -- 0.50 packs/day for 30 years     Types: Cigarettes   . Smokeless tobacco: Never Used   . Alcohol Use: No     Family History   Problem Relation Age of Onset   . Hypertension Mother    . Stroke Mother    . Hypertension Father    . Kidney disease Brother      The following portions of the patient's history were reviewed and updated as appropriate: allergies, current medications and problem list.    Review of Systems   HENT: Negative.  Negative for hearing loss, rhinorrhea and sinus pressure.         Partial dentures.   Eyes: Negative.    Respiratory: Negative.  Negative for cough and shortness of breath.    Cardiovascular: Negative.  Negative for chest pain, palpitations and leg swelling.    Genitourinary: Negative.    Musculoskeletal: Positive for back pain.        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 S/P steroid injections.  Longstanding LBP which has persisted despite PT, epidural steroid injections.     Skin: Negative.    Neurological: Negative for dizziness, light-headedness and headaches.   Hematological: Negative.  Negative for adenopathy. Does not bruise/bleed easily.   Psychiatric/Behavioral: Negative for sleep disturbance, dysphoric mood and decreased concentration. The patient is nervous/anxious.            Objective:    Physical Exam  BP 102/70 mmHg  Pulse 116  Temp(Src) 97 F (36.1 C) (Oral)  Ht 1.53 m (5' 0.24")  Wt 83.825 kg (184 lb 12.8 oz)  BMI 35.81 kg/m2  Constitutional:  Alert, oriented X 3. NAD  Head    Atraumatic, normocephalic  Eye:   Conjunctival - pink  EENT   Oropharynx is clear, good dentition, lips without lesions  Neck:   Trachea is midline, no thyromegaly  Lymph:  No cervical or supraclavicular adenopathy  Chest:   Unlabored respirations. Lungs clear to auscultation, good breath  sounds  Cardiac:  Normal S1&S2 without murmurs. No edema.  Abdomen:  Soft, nontender, no palpable masses, liver or spleen  Back   No CVA or spinous tenderness  Skin:   Warm, dry, no rashes in visible areas  Psych:   Normal affect, thought content    Lab Results   Component Value Date    WBC 6.74 03/12/2015    HGB 14.9 03/12/2015    HCT 47.2* 03/12/2015    PLT 337 03/12/2015    ALT 43 03/12/2015    AST 38* 03/12/2015    NA 138 03/12/2015    K 4.5 03/12/2015    CL 101 03/12/2015    CREAT 0.8 03/12/2015    BUN 14.0 03/12/2015    CO2 26 03/12/2015    GLU 86 03/12/2015       EKG   Normal sinus rhythm. Within normal limits.      Assessment:       59 y.o. female seen prior to left knee arthroscopy. with multiple medical problems for follow up.    There is no evidence of CHF, ventricular ectopy, or unstable angina. There are no medical contraindications to the proposed procedure.         Plan:       Procedures    Proceed with surgery as planned.    Santiago Glad, MD  [facility]  [address]  [place], [place] [address]

## 2015-03-12 ENCOUNTER — Other Ambulatory Visit (FREE_STANDING_LABORATORY_FACILITY): Payer: No Typology Code available for payment source

## 2015-03-12 DIAGNOSIS — I10 Essential (primary) hypertension: Secondary | ICD-10-CM

## 2015-03-12 LAB — COMPREHENSIVE METABOLIC PANEL
ALT: 43 U/L (ref 0–55)
AST (SGOT): 38 U/L — ABNORMAL HIGH (ref 5–34)
Albumin/Globulin Ratio: 1.1 (ref 0.9–2.2)
Albumin: 3.9 g/dL (ref 3.5–5.0)
Alkaline Phosphatase: 90 U/L (ref 37–106)
BUN: 14 mg/dL (ref 7.0–19.0)
Bilirubin, Total: 0.5 mg/dL (ref 0.1–1.2)
CO2: 26 mEq/L (ref 21–30)
Calcium: 9.9 mg/dL (ref 8.5–10.5)
Chloride: 101 mEq/L (ref 100–111)
Creatinine: 0.8 mg/dL (ref 0.4–1.5)
Globulin: 3.7 g/dL (ref 2.0–3.7)
Glucose: 86 mg/dL (ref 70–100)
Potassium: 4.5 mEq/L (ref 3.5–5.3)
Protein, Total: 7.6 g/dL (ref 6.0–8.3)
Sodium: 138 mEq/L (ref 135–146)

## 2015-03-12 LAB — CBC
Hematocrit: 47.2 % — ABNORMAL HIGH (ref 37.0–47.0)
Hgb: 14.9 g/dL (ref 12.0–16.0)
MCH: 30 pg (ref 28.0–32.0)
MCHC: 31.6 g/dL — ABNORMAL LOW (ref 32.0–36.0)
MCV: 95 fL (ref 80.0–100.0)
MPV: 12.2 fL (ref 9.4–12.3)
Nucleated RBC: 0 /100 WBC (ref 0–1)
Platelets: 337 10*3/uL (ref 140–400)
RBC: 4.97 10*6/uL (ref 4.20–5.40)
RDW: 14 % (ref 12–15)
WBC: 6.74 10*3/uL (ref 3.50–10.80)

## 2015-03-12 LAB — GFR: EGFR: >60

## 2015-03-12 LAB — HEMOLYSIS INDEX: Hemolysis Index: 46 — ABNORMAL HIGH (ref 0–18)

## 2015-03-17 ENCOUNTER — Telehealth (INDEPENDENT_AMBULATORY_CARE_PROVIDER_SITE_OTHER): Payer: Self-pay

## 2015-03-17 NOTE — Telephone Encounter (Signed)
Patient is aware of lab results, she is questioning if there is anything she can do for her elevated LFT (38). Please advise, thank you

## 2015-03-17 NOTE — Telephone Encounter (Signed)
Only minor elevation. LFTS can flucuate for a variety of reasons - often returning to normal without a clear explanation. Repeat lab in 3-4 months to monitor liver.

## 2015-03-17 NOTE — Telephone Encounter (Signed)
Please advise regarding elevated LFT (38) Thank you

## 2015-03-18 NOTE — Telephone Encounter (Signed)
Patient is aware 

## 2015-03-19 HISTORY — PX: KNEE ARTHROSCOPY: SUR90

## 2015-04-05 ENCOUNTER — Other Ambulatory Visit (INDEPENDENT_AMBULATORY_CARE_PROVIDER_SITE_OTHER): Payer: Self-pay | Admitting: Internal Medicine

## 2015-04-06 ENCOUNTER — Telehealth (INDEPENDENT_AMBULATORY_CARE_PROVIDER_SITE_OTHER): Payer: Self-pay

## 2015-04-06 MED ORDER — TRAMADOL HCL 50 MG PO TABS
50.0000 mg | ORAL_TABLET | Freq: Four times a day (QID) | ORAL | Status: DC | PRN
Start: 2015-04-06 — End: 2015-05-28

## 2015-04-06 NOTE — Telephone Encounter (Signed)
Called pt to advise prescription refill for tramadol ready for pick up at front desk.

## 2015-05-04 ENCOUNTER — Other Ambulatory Visit (INDEPENDENT_AMBULATORY_CARE_PROVIDER_SITE_OTHER): Payer: Self-pay | Admitting: Internal Medicine

## 2015-05-04 MED ORDER — HYDROCODONE-ACETAMINOPHEN 5-325 MG PO TABS
1.0000 | ORAL_TABLET | Freq: Four times a day (QID) | ORAL | Status: DC | PRN
Start: 2015-05-04 — End: 2015-07-15

## 2015-05-04 NOTE — Telephone Encounter (Signed)
Office visit before next refill

## 2015-05-04 NOTE — Telephone Encounter (Signed)
Pt. Is out of her HYDROcodone-acetaminophen (NORCO) 5-325 MG per tablet medication. Please write refill prescription and call pt. at [phone] (M) when it's ready for pick up.    Thank you, MCL

## 2015-05-05 ENCOUNTER — Telehealth (INDEPENDENT_AMBULATORY_CARE_PROVIDER_SITE_OTHER): Payer: Self-pay

## 2015-05-05 NOTE — Telephone Encounter (Signed)
Called pt to advise Norco script ready for pick up. LM to call back with questions. Script at front desk in pick up drawer.

## 2015-05-17 ENCOUNTER — Other Ambulatory Visit (INDEPENDENT_AMBULATORY_CARE_PROVIDER_SITE_OTHER): Payer: Self-pay | Admitting: Internal Medicine

## 2015-05-28 ENCOUNTER — Other Ambulatory Visit (INDEPENDENT_AMBULATORY_CARE_PROVIDER_SITE_OTHER): Payer: Self-pay | Admitting: Internal Medicine

## 2015-06-01 ENCOUNTER — Telehealth (INDEPENDENT_AMBULATORY_CARE_PROVIDER_SITE_OTHER): Payer: Self-pay

## 2015-06-01 NOTE — Telephone Encounter (Signed)
Called pt to advise script for tramadol ready for pick up (script at front desk). Pt verbalized understanding.

## 2015-06-20 ENCOUNTER — Other Ambulatory Visit (INDEPENDENT_AMBULATORY_CARE_PROVIDER_SITE_OTHER): Payer: Self-pay | Admitting: Internal Medicine

## 2015-06-21 ENCOUNTER — Telehealth (INDEPENDENT_AMBULATORY_CARE_PROVIDER_SITE_OTHER): Payer: Self-pay

## 2015-06-21 NOTE — Telephone Encounter (Signed)
Called pt to advise xanax script ready for pick up at front desk. Pt verbalized understanding.

## 2015-07-12 ENCOUNTER — Other Ambulatory Visit (INDEPENDENT_AMBULATORY_CARE_PROVIDER_SITE_OTHER): Payer: Self-pay | Admitting: Internal Medicine

## 2015-07-12 NOTE — Telephone Encounter (Signed)
Rx refill HYDROcodone-acetaminophen (NORCO) 5-325 MG per tablet, please call pt when is ready to be pick up.

## 2015-07-15 ENCOUNTER — Ambulatory Visit (INDEPENDENT_AMBULATORY_CARE_PROVIDER_SITE_OTHER): Payer: No Typology Code available for payment source | Admitting: Internal Medicine

## 2015-07-15 ENCOUNTER — Encounter (INDEPENDENT_AMBULATORY_CARE_PROVIDER_SITE_OTHER): Payer: Self-pay | Admitting: Internal Medicine

## 2015-07-15 VITALS — BP 121/84 | HR 106 | Temp 98.1°F | Ht 60.0 in | Wt 192.4 lb

## 2015-07-15 DIAGNOSIS — Z23 Encounter for immunization: Secondary | ICD-10-CM

## 2015-07-15 DIAGNOSIS — M5416 Radiculopathy, lumbar region: Secondary | ICD-10-CM

## 2015-07-15 DIAGNOSIS — I10 Essential (primary) hypertension: Secondary | ICD-10-CM

## 2015-07-15 DIAGNOSIS — M170 Bilateral primary osteoarthritis of knee: Secondary | ICD-10-CM

## 2015-07-15 MED ORDER — HYDROCODONE-ACETAMINOPHEN 5-325 MG PO TABS
1.0000 | ORAL_TABLET | Freq: Four times a day (QID) | ORAL | Status: DC | PRN
Start: 2015-07-15 — End: 2015-09-06

## 2015-07-15 MED ORDER — TRAMADOL HCL 50 MG PO TABS
50.0000 mg | ORAL_TABLET | Freq: Three times a day (TID) | ORAL | Status: DC | PRN
Start: 2015-07-15 — End: 2015-09-06

## 2015-07-15 MED ORDER — DICLOFENAC POTASSIUM 25 MG PO CAPS
25.0000 mg | ORAL_CAPSULE | Freq: Once | ORAL | Status: DC
Start: 2015-07-15 — End: 2015-08-11

## 2015-07-15 NOTE — Progress Notes (Signed)
Have you seen any new specialists/physicians since you were last here?  No.          Limb alert protocol reviewed?  Pt has no restrictions.       Pt is having a PAP done on 07/25/2015.      Patient presented to the office for Influenza administration.  Received injection in the Left arm.  No reaction was noted and patient left in good condition.

## 2015-07-15 NOTE — Progress Notes (Signed)
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  HPI  LPB - she has had longstanding LBP which has persisted despite PT, epidural steroid injections.  She has low back pain with radiation to both legs; R>L. No sphincter dysfunction. She uses Tramadol BID and occasional Norco for relief.  MRI of L/S spine -  overall mild degenerative changes of the lumbar spine without significant spinal canal stenosis. There is mild neural foraminal narrowing at L3-L4 due to disc bulging, endplate osteophytic spurring, grade 1 anterolisthesis and facet hypertrophy. Recently she has had increased LBP with radiation of pain tp left posterio thigh. No associated fever, chills, sphincter dysfunction, weakness.  Arthritis of knee - she had  R knee surgery by Dr. Rayvon Char 02/2015. she continues to have knee pain.  Hypertension - she has not been monitoring BP. She is compliant with medication.   Anxiety -  She takes Xanax nightly.    Current Outpatient Prescriptions   Medication Sig   . ALPRAZolam (XANAX) 0.5 MG tablet TAKE 1 TABLET BY MOUTH NIGHTLY AS NEEDED   . betamethasone dipropionate (DIPROLENE) 0.05 % cream Apply topically 2 (two) times daily.   Marland Kitchen losartan-hydrochlorothiazide (HYZAAR) 100-12.5 MG per tablet TAKE 1 TABLET BY MOUTH DAILY   . traMADol (ULTRAM) 50 MG tablet Take 1 tablet (50 mg total) by mouth every 8 (eight) hours as needed for Pain.   Marland Kitchen HYDROcodone-acetaminophen (NORCO) 5-325 MG per tablet Take 1-2 tablets by mouth every 6 (six) hours as needed for Pain.     The following portions of the patient's history were reviewed and updated as appropriate: allergies, current medications and problem list.    Review of Systems        Objective:    Physical Exam  BP 121/84 mmHg  Pulse 106  Temp(Src) 98.1 F (36.7 C)  Ht 1.524 m (5')  Wt 87.272 kg (192 lb 6.4 oz)  BMI 37.58 kg/m2  Constitutional:  Alert, oriented X 3. NAD obese  Head    Atraumatic,  normocephalic  Eye:   Conjunctival - pink  Chest:   Unlabored respirations. Lungs clear to auscultation, good breath sounds  Cardiac:  Normal S1&S2 without murmurs. No edema.  Abdomen:  Soft, nontender, no palpable masses, liver or spleen  Back   No CVA, paraspinous or spinous tenderness; negative SLR  Skin:   Warm, dry, no rashes in visible areas  Psych:   Normal affect, thought content        Assessment:       1. Lumbar radiculopathy    2. Primary osteoarthritis of both knees    3. Essential hypertension    4. Need for immunization against influenza             Plan:      Procedures    William's exercises.   NSAIDs ( warnings given), tramadol for pain with occasional use of Narco.

## 2015-07-29 ENCOUNTER — Ambulatory Visit (INDEPENDENT_AMBULATORY_CARE_PROVIDER_SITE_OTHER): Payer: No Typology Code available for payment source | Admitting: Internal Medicine

## 2015-08-11 ENCOUNTER — Other Ambulatory Visit (INDEPENDENT_AMBULATORY_CARE_PROVIDER_SITE_OTHER): Payer: Self-pay | Admitting: Internal Medicine

## 2015-08-20 ENCOUNTER — Other Ambulatory Visit (INDEPENDENT_AMBULATORY_CARE_PROVIDER_SITE_OTHER): Payer: Self-pay | Admitting: Internal Medicine

## 2015-08-20 MED ORDER — ALPRAZOLAM 0.5 MG PO TABS
ORAL_TABLET | ORAL | Status: DC
Start: 2015-08-20 — End: 2015-10-19

## 2015-08-20 NOTE — Telephone Encounter (Signed)
Rx refill ALPRAZolam (XANAX) 0.5 MG tablet, please call pt back when is ready to be pick up.

## 2015-08-23 ENCOUNTER — Telehealth (INDEPENDENT_AMBULATORY_CARE_PROVIDER_SITE_OTHER): Payer: Self-pay

## 2015-08-23 NOTE — Telephone Encounter (Signed)
Called to advise pt xanax script ready for pick up.Pt verbalized understanding.

## 2015-09-01 ENCOUNTER — Other Ambulatory Visit (INDEPENDENT_AMBULATORY_CARE_PROVIDER_SITE_OTHER): Payer: Self-pay | Admitting: Internal Medicine

## 2015-09-01 NOTE — Telephone Encounter (Signed)
Notes Recorded by Santiago Glad, MD on 08/24/2015 at 1:41 PM  Mammogram - OK    Called pt to advise above notes. Pt verbalized understanding.

## 2015-09-01 NOTE — Telephone Encounter (Signed)
Rx refill HYDROcodone-acetaminophen (NORCO) 5-325 MG per tablet, please call pt back when is ready.

## 2015-09-01 NOTE — Telephone Encounter (Signed)
Sent to dr Jerolyn Center via refill enc

## 2015-09-06 ENCOUNTER — Encounter (INDEPENDENT_AMBULATORY_CARE_PROVIDER_SITE_OTHER): Payer: Self-pay | Admitting: Internal Medicine

## 2015-09-06 ENCOUNTER — Ambulatory Visit (INDEPENDENT_AMBULATORY_CARE_PROVIDER_SITE_OTHER): Payer: No Typology Code available for payment source | Admitting: Internal Medicine

## 2015-09-06 VITALS — BP 114/79 | HR 99 | Temp 98.3°F | Wt 191.4 lb

## 2015-09-06 DIAGNOSIS — G8929 Other chronic pain: Secondary | ICD-10-CM

## 2015-09-06 DIAGNOSIS — I10 Essential (primary) hypertension: Secondary | ICD-10-CM

## 2015-09-06 DIAGNOSIS — R319 Hematuria, unspecified: Secondary | ICD-10-CM

## 2015-09-06 DIAGNOSIS — M545 Low back pain: Secondary | ICD-10-CM

## 2015-09-06 MED ORDER — TRAMADOL HCL 50 MG PO TABS
50.0000 mg | ORAL_TABLET | Freq: Three times a day (TID) | ORAL | Status: DC | PRN
Start: 2015-09-06 — End: 2015-10-26

## 2015-09-06 MED ORDER — DICLOFENAC SODIUM 25 MG PO TBEC
DELAYED_RELEASE_TABLET | ORAL | Status: DC
Start: 2015-09-06 — End: 2015-12-16

## 2015-09-06 MED ORDER — HYDROCODONE-ACETAMINOPHEN 5-325 MG PO TABS
1.0000 | ORAL_TABLET | Freq: Four times a day (QID) | ORAL | Status: DC | PRN
Start: 2015-09-06 — End: 2015-12-06

## 2015-09-06 NOTE — Patient Instructions (Signed)
Please schedule an appointment with Pain Management:    [facility]  (Multiple Locations)  [phone]  [email or web address]

## 2015-09-06 NOTE — Progress Notes (Signed)
Have you seen any new specialists/physicians since you were last here?no      Limb alert protocol reviewed?  Yes    Pt states had pap a month

## 2015-09-06 NOTE — Progress Notes (Signed)
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    HPI  LPB - she has had longstanding LBP which has persisted despite PT, epidural steroid injections.  She has low back pain with radiation to both legs; R>L. No sphincter dysfunction. She uses Tramadol BID and occasional Norco for relief.  MRI of L/S spine -  overall mild degenerative changes of the lumbar spine without significant spinal canal stenosis. There is mild neural foraminal narrowing at L3-L4 due to disc bulging, endplate osteophytic spurring, grade 1 anterolisthesis and facet hypertrophy. Recently she has had increased LBP with radiation of pain to left posterio thigh. No associated fever, chills, sphincter dysfunction, weakness.   Arthritis of knee - she had  R knee surgery by Dr. Rayvon Char 02/2015. She continues to have knee pain.  Hypertension - she has been monitoring BP. Home systolic <130. She is compliant with medication.   Anxiety -  She takes Xanax nightly.      Current outpatient prescriptions:   .  ALPRAZolam (XANAX) 0.5 MG tablet, TAKE 1 TABLET BY MOUTH NIGHTLY AS NEEDED, Disp: 30 tablet, Rfl: 1  .  betamethasone dipropionate (DIPROLENE) 0.05 % cream, Apply topically 2 (two) times daily., Disp: , Rfl:   .  diclofenac (VOLTAREN) 25 MG EC tablet, TAKE 1 TABLET BY MOUTH ONCE A DAY, Disp: 30 tablet, Rfl: 3  .  HYDROcodone-acetaminophen (NORCO) 5-325 MG per tablet, Take 1-2 tablets by mouth every 6 (six) hours as needed for Pain., Disp: 120 tablet, Rfl: 0  .  losartan-hydrochlorothiazide (HYZAAR) 100-12.5 MG per tablet, TAKE 1 TABLET BY MOUTH DAILY, Disp: 90 tablet, Rfl: 3  .  traMADol (ULTRAM) 50 MG tablet, Take 1 tablet (50 mg total) by mouth every 8 (eight) hours as needed for Pain., Disp: 60 tablet, Rfl: 1    The following portions of the patient's history were reviewed and updated as appropriate: allergies, current medications and problem list.    Review of Systems        Objective:     Physical Exam  BP 114/79 mmHg  Pulse 99  Temp(Src) 98.3 F (36.8 C) (Oral)  Wt 86.818 kg (191 lb 6.4 oz)  Constitutional:  Alert, oriented X 3. NAD  Head    Atraumatic, normocephalic  Eye:   Conjunctival - pink  Chest:   Unlabored respirations. Lungs clear to auscultation, good breath sounds  Cardiac:  Normal S1&S2 without murmurs. No edema.  Abdomen:  Soft, nontender, no palpable masses, liver or spleen  Skin:   Warm, dry, no rashes in visible areas  Psych:   Normal affect, thought content        Assessment:       1. Chronic bilateral low back pain without sciatica    2. Essential hypertension    3. Hematuria               Plan:      Procedures    Renew medication. Risk & Benefits of narcotic medication(s) were explained to the pt who appeared to understand & agree to the treatment plan.   Refer to Pain and Spine

## 2015-09-10 ENCOUNTER — Other Ambulatory Visit (FREE_STANDING_LABORATORY_FACILITY): Payer: No Typology Code available for payment source

## 2015-09-10 DIAGNOSIS — R319 Hematuria, unspecified: Secondary | ICD-10-CM

## 2015-09-10 DIAGNOSIS — I10 Essential (primary) hypertension: Secondary | ICD-10-CM

## 2015-09-10 LAB — URINALYSIS
Bilirubin, UA: NEGATIVE
Blood, UA: NEGATIVE
Glucose, UA: NEGATIVE
Ketones UA: NEGATIVE
Leukocyte Esterase, UA: NEGATIVE
Nitrite, UA: NEGATIVE
Protein, UR: NEGATIVE
Specific Gravity UA: 1.015 (ref 1.001–1.035)
Urine pH: 6 (ref 5.0–8.0)
Urobilinogen, UA: 0.2 (ref 0.2–2.0)

## 2015-09-10 LAB — COMPREHENSIVE METABOLIC PANEL
ALT: 39 U/L (ref 0–55)
AST (SGOT): 32 U/L (ref 5–34)
Albumin/Globulin Ratio: 1.2 (ref 0.9–2.2)
Albumin: 4 g/dL (ref 3.5–5.0)
Alkaline Phosphatase: 96 U/L (ref 37–106)
BUN: 18 mg/dL (ref 7.0–19.0)
Bilirubin, Total: 0.4 mg/dL (ref 0.1–1.2)
CO2: 26 mEq/L (ref 21–30)
Calcium: 9.8 mg/dL (ref 8.5–10.5)
Chloride: 102 mEq/L (ref 100–111)
Creatinine: 0.9 mg/dL (ref 0.4–1.5)
Globulin: 3.4 g/dL (ref 2.0–3.7)
Glucose: 81 mg/dL (ref 70–100)
Potassium: 4.3 mEq/L (ref 3.5–5.3)
Protein, Total: 7.4 g/dL (ref 6.0–8.3)
Sodium: 138 mEq/L (ref 135–146)

## 2015-09-10 LAB — GFR: EGFR: >60

## 2015-09-10 LAB — HEMOLYSIS INDEX: Hemolysis Index: 8 (ref 0–18)

## 2015-10-18 ENCOUNTER — Other Ambulatory Visit (INDEPENDENT_AMBULATORY_CARE_PROVIDER_SITE_OTHER): Payer: Self-pay | Admitting: Internal Medicine

## 2015-10-18 NOTE — Telephone Encounter (Signed)
Rx refill ALPRAZolam (XANAX) 0.5 MG tablet, please call pt when is ready to be pick up.

## 2015-10-19 ENCOUNTER — Telehealth (INDEPENDENT_AMBULATORY_CARE_PROVIDER_SITE_OTHER): Payer: Self-pay

## 2015-10-19 MED ORDER — ALPRAZOLAM 0.5 MG PO TABS
ORAL_TABLET | ORAL | Status: DC
Start: 2015-10-19 — End: 2015-12-06

## 2015-10-19 NOTE — Telephone Encounter (Signed)
Called pt to advise script for xanax ready for pick up at front desk. LM to call back with questions.

## 2015-10-25 ENCOUNTER — Telehealth (INDEPENDENT_AMBULATORY_CARE_PROVIDER_SITE_OTHER): Payer: Self-pay | Admitting: Internal Medicine

## 2015-10-25 NOTE — Telephone Encounter (Signed)
Patient called to request a refill for traMADol (ULTRAM) 50 MG tablet (mathews patient) send to pharmacy on file

## 2015-10-26 MED ORDER — TRAMADOL HCL 50 MG PO TABS
50.0000 mg | ORAL_TABLET | Freq: Three times a day (TID) | ORAL | Status: DC | PRN
Start: 2015-10-26 — End: 2015-12-06

## 2015-10-26 NOTE — Telephone Encounter (Signed)
done

## 2015-10-27 ENCOUNTER — Telehealth (INDEPENDENT_AMBULATORY_CARE_PROVIDER_SITE_OTHER): Payer: Self-pay

## 2015-10-27 NOTE — Telephone Encounter (Signed)
Called pt to advise tramadol script ready for pick up at front desk. Pt verbalized understanding.

## 2015-12-06 ENCOUNTER — Encounter (INDEPENDENT_AMBULATORY_CARE_PROVIDER_SITE_OTHER): Payer: Self-pay | Admitting: Internal Medicine

## 2015-12-06 ENCOUNTER — Ambulatory Visit (FREE_STANDING_LABORATORY_FACILITY): Payer: No Typology Code available for payment source | Admitting: Internal Medicine

## 2015-12-06 VITALS — BP 112/76 | HR 112 | Temp 97.6°F | Wt 191.8 lb

## 2015-12-06 DIAGNOSIS — Z01818 Encounter for other preprocedural examination: Secondary | ICD-10-CM

## 2015-12-06 DIAGNOSIS — M25562 Pain in left knee: Secondary | ICD-10-CM

## 2015-12-06 LAB — CBC
Hematocrit: 42.7 % (ref 37.0–47.0)
Hgb: 13.6 g/dL (ref 12.0–16.0)
MCH: 31.1 pg (ref 28.0–32.0)
MCHC: 31.9 g/dL — ABNORMAL LOW (ref 32.0–36.0)
MCV: 97.7 fL (ref 80.0–100.0)
MPV: 12.4 fL — ABNORMAL HIGH (ref 9.4–12.3)
Nucleated RBC: 0 /100 WBC (ref 0–1)
Platelets: 313 10*3/uL (ref 140–400)
RBC: 4.37 10*6/uL (ref 4.20–5.40)
RDW: 14 % (ref 12–15)
WBC: 7.61 10*3/uL (ref 3.50–10.80)

## 2015-12-06 LAB — BASIC METABOLIC PANEL
BUN: 17 mg/dL (ref 7.0–19.0)
CO2: 31 mEq/L — ABNORMAL HIGH (ref 21–30)
Calcium: 10.4 mg/dL (ref 8.5–10.5)
Chloride: 101 mEq/L (ref 100–111)
Creatinine: 1.3 mg/dL (ref 0.4–1.5)
Glucose: 93 mg/dL (ref 70–100)
Potassium: 4.4 mEq/L (ref 3.5–5.3)
Sodium: 140 mEq/L (ref 135–146)

## 2015-12-06 LAB — GFR: EGFR: 50.8

## 2015-12-06 LAB — HEMOLYSIS INDEX: Hemolysis Index: 3 (ref 0–18)

## 2015-12-06 MED ORDER — LOSARTAN POTASSIUM-HCTZ 100-12.5 MG PO TABS
ORAL_TABLET | ORAL | Status: DC
Start: 2015-12-06 — End: 2016-02-04

## 2015-12-06 MED ORDER — HYDROCODONE-ACETAMINOPHEN 5-325 MG PO TABS
1.0000 | ORAL_TABLET | Freq: Four times a day (QID) | ORAL | Status: DC | PRN
Start: 2015-12-06 — End: 2015-12-16

## 2015-12-06 MED ORDER — BETAMETHASONE DIPROPIONATE 0.05 % EX CREA
TOPICAL_CREAM | Freq: Two times a day (BID) | CUTANEOUS | Status: DC
Start: 2015-12-06 — End: 2016-04-06

## 2015-12-06 MED ORDER — TRAMADOL HCL 50 MG PO TABS
50.0000 mg | ORAL_TABLET | Freq: Three times a day (TID) | ORAL | Status: DC | PRN
Start: 2015-12-06 — End: 2015-12-16

## 2015-12-06 MED ORDER — ALPRAZOLAM 0.5 MG PO TABS
ORAL_TABLET | ORAL | Status: DC
Start: 2015-12-06 — End: 2016-02-04

## 2015-12-06 NOTE — Progress Notes (Signed)
Subjective:       Patient ID: [name] is a 60 y.o. female seen prior to L knee TKA by Dr. Rayvon Char at [facility] on March 28.   She had  R knee surgery by Dr. Rayvon Char 02/2015. She continues to have bilateral  knee pain.  She has well controlled hypertension.. She denies exertional chest pain, SOB, palpitations. She denies abnormal bleeding  HPI   Co morbidities include:  LPB - she has had longstanding LBP which has persisted despite PT, epidural steroid injections.  She has low back pain with radiation to both legs; R>L. No sphincter dysfunction. She uses Tramadol BID and occasional Norco for relief.  MRI of L/S spine -  overall mild degenerative changes of the lumbar spine without significant spinal canal stenosis. There is mild neural foraminal narrowing at L3-L4 due to disc bulging, endplate osteophytic spurring, grade 1 anterolisthesis and facet hypertrophy. Recently she has had increased LBP with radiation of pain to left posterio thigh. No associated fever, chills, sphincter dysfunction, weakness.   Hypertension - she has been monitoring BP. Home systolic <130. She is compliant with medication.   Anxiety -  She takes Xanax nightly.    Past Surgical History   Procedure Laterality Date   . Ganglion cyst excision     . Knee arthroscopy Right 09/24/2014     Past Medical History   Diagnosis Date   . Hypertensive disorder    . Anxiety    . Lower back pain    . Neck pain      Allergies   Allergen Reactions   . Latex Rash     Medication Sig   . ALPRAZolam (XANAX) 0.5 MG tablet TAKE 1 TABLET BY MOUTH NIGHTLY AS NEEDED   . betamethasone dipropionate (DIPROLENE) 0.05 % cream Apply topically 2 (two) times daily.   . diclofenac (VOLTAREN) 25 MG EC tablet TAKE 1 TABLET BY MOUTH ONCE A DAY   . HYDROcodone-acetaminophen (NORCO) 5-325 MG per tablet Take 1-2 tablets by mouth every 6 (six) hours as needed for Pain.   Marland Kitchen losartan-hydrochlorothiazide (HYZAAR) 100-12.5 MG per tablet TAKE 1 TABLET BY MOUTH DAILY   . traMADol  (ULTRAM) 50 MG tablet Take 1 tablet (50 mg total) by mouth every 8 (eight) hours as needed for Pain.     No current facility-administered medications for this visit.     Social History   Substance Use Topics   . Smoking status: Current Every Day Smoker -- 0.50 packs/day for 30 years     Types: Cigarettes   . Smokeless tobacco: Never Used   . Alcohol Use: No     Family History   Problem Relation Age of Onset   . Hypertension Mother    . Stroke Mother    . Hypertension Father    . Kidney disease Brother          The following portions of the patient's history were reviewed and updated as appropriate: allergies, current medications, past family history, past medical history, past social history, past surgical history and problem list.    Review of Systems   Constitutional:        She swims twice weekly.   HENT: Negative for dental problem, hearing loss and rhinorrhea.    Eyes: Negative.    Respiratory: Negative.  Negative for cough and shortness of breath.    Cardiovascular: Negative for chest pain, palpitations and leg swelling.   Gastrointestinal: Negative.  Negative for abdominal pain, diarrhea and constipation.  Genitourinary: Negative.    Musculoskeletal: Positive for back pain.   Neurological: Negative.  Negative for dizziness, light-headedness and headaches.   Hematological: Negative.  Does not bruise/bleed easily.   Psychiatric/Behavioral: Negative for sleep disturbance, dysphoric mood and decreased concentration. The patient is nervous/anxious.            Objective:    Physical Exam  BP 112/76 mmHg  Pulse 112  Temp(Src) 97.6 F (36.4 C) (Oral)  Wt 87 kg (191 lb 12.8 oz)  Constitutional:  Alert, oriented X 3. NAD  Head    Atraumatic, normocephalic  Eye:   Conjunctival - pink  EENT   Oropharynx is clear, poor dentition  Neck:   Trachea is midline, no thyromegaly  Lymph:  No cervical or supraclavicular adenopathy  Chest:   Unlabored respirations. Lungs clear to auscultation, good breath  sounds  Cardiac:  Normal S1&S2 without murmurs. No edema.  Abdomen:  Soft, nontender, no palpable masses, liver or spleen  Back   No CVA or spinous tenderness  Skin:   Warm, dry, no rashes in visible areas  Psych:   Normal affect, thought content    Lab Results   Component Value Date    WBC 7.61 12/06/2015    HGB 13.6 12/06/2015    HCT 42.7 12/06/2015    PLT 313 12/06/2015    ALT 39 09/10/2015    AST 32 09/10/2015    NA 140 12/06/2015    K 4.4 12/06/2015    CL 101 12/06/2015    CREAT 1.3 12/06/2015    BUN 17.0 12/06/2015    CO2 31* 12/06/2015    GLU 93 12/06/2015       EKG   Normal sinus rhythm. Within normal limits.      Assessment:        60 y.o. female seen prior to L knee TKA. Hypertension is well controlled.  There is no evidence of CHF, ventricular ectopy, or unstable angina. There are no medical contraindications to the proposed procedure.      Plan:      Procedures    Proceed with surgery as planned.    Santiago Glad, MD  [facility]  [address]  [place], [place] [address]

## 2015-12-08 ENCOUNTER — Telehealth (INDEPENDENT_AMBULATORY_CARE_PROVIDER_SITE_OTHER): Payer: Self-pay

## 2015-12-08 NOTE — Telephone Encounter (Signed)
Preop clearance faxed to Ortho , Dr. Rayvon Char.

## 2015-12-09 ENCOUNTER — Ambulatory Visit (INDEPENDENT_AMBULATORY_CARE_PROVIDER_SITE_OTHER): Payer: No Typology Code available for payment source | Admitting: Internal Medicine

## 2015-12-09 ENCOUNTER — Ambulatory Visit: Payer: No Typology Code available for payment source | Attending: Orthopaedic Surgery

## 2015-12-09 NOTE — Pre-Procedure Instructions (Addendum)
Faxed surgeon for Lab, EKG, Clearance, consent and surgeon H&P and pre-op orders with name and DOB on both pages.  Unable to fax DOS order to pharmacy without a MD signature.   Emailed the patient and Freight forwarder for review and finance contact number.

## 2015-12-10 NOTE — Pre-Procedure Instructions (Signed)
Spoke to Twee Dr. Fransisco Hertz office to resend pre-op orders with pt's name and DOB each page, she will.

## 2015-12-13 NOTE — Anesthesia Preprocedure Evaluation (Addendum)
Anesthesia Evaluation    AIRWAY    Mallampati: II    TM distance: >3 FB  Neck ROM: full  Mouth Opening:full   CARDIOVASCULAR    regular and normal       DENTAL         PULMONARY    clear to auscultation     OTHER FINDINGS              PSS Anesthesia Comments: Allergic to Latex. NPO. Post menopausal. Morbid Obesity, HTN, Anxiety, Low back pain with radiculopathy pain. Current everyday smoker with coarse BS on Ausculation. EKG NSR        Anesthesia Plan    ASA 3     spinal               (Risks discussed including but not limited to:    - Neurological complications such as stroke, TIA  - Cardiovascular complications such as heart attack, Arrhythmias, Cardiac arrest   - Pulmonary complications such as Asthmatic attack,Pulm. Aspiration, Bronchospasm and Pneumonia  - Intra-operative awareness,  - Dental Injuries  - Sore Throat.  - Allergic reactions.  - Death.     Anesthesia explained and Questions answered.     Pt understands and wishes to proceed.    Jaleiah Asay Aura Dials , MD  )      intravenous induction   Detailed anesthesia plan: PNB, spinal and general IV        Post op pain management: per surgeon and PNB single shot    informed consent obtained    Plan discussed with CRNA.    ECG reviewed  pertinent labs reviewed  imaging results reviewed

## 2015-12-13 NOTE — H&P (Signed)
PRE-OP HISTORY AND PHYSICAL EXAM    Date Time: 12/14/2015 9:22 AM  Patient Name: [name]  Attending Physician: Cynda Familia, MD    Assessment:   Left knee DJD    Plan:   Left TKA     History of Present Illness:   [name] is a 60 y.o. female who presents to the hospital with a prolonged history of left knee pain due to OA which has failed conservative treatment including corticosteroid injections, therapy, activity modification and NSAIDs. She understands the risks and benefits of a total knee replacement and is ready to proceed.     Past Medical History:     Past Medical History   Diagnosis Date   . Neck pain      related to lower back but significant at this time    . Hypertensive disorder      stable meds   . 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    . Arthritis      bilateral knees   . Difficulty walking      limps   . Lower back pain      sciatica radiates down LLE: Pain range= 0-10   . Neuropathy      numbness upon awakening from low back radiating down LLE .Marland Kitchen. diminishes after awhile after moving around   . Eczema      has prescription cream   . Carpal tunnel syndrome on both sides      stiffness noted    . Abnormal vision      glasses   . Anxiety      uses xanax @ bedtime       Past Surgical History:     Past Surgical History   Procedure Laterality Date   . Ganglion cyst excision Right 2007   . Knee arthroscopy Right 09/24/2014   . Knee arthroscopy Left 03/2015   . Colonoscopy  2016     X1 polyp   . Induced abortion     . Vaginal delivery       X2 spinals ( 4natural deliveries)       Family History:     Family History   Problem Relation Age of Onset   . Hypertension Mother    . Stroke Mother    . Hypertension Father    . Kidney disease Brother        Social History:     Social History     Social History   . Marital Status: Single     Spouse Name: N/A   . Number of Children: N/A   . Years of Education: N/A     Social History Main Topics   . Smoking status: Current Every Day Smoker  -- 0.25 packs/day for 30 years     Types: Cigarettes   . Smokeless tobacco: Never Used      Comment: keeps on reducing the amount of cigarettes she smokes ...    . Alcohol Use: No   . Drug Use: No   . Sexual Activity: Not on file     Other Topics Concern   . Not on file     Social History Narrative       Allergies:     Allergies   Allergen Reactions   . Latex Rash       Medications:     Prescriptions prior to admission   Medication Sig   . ALPRAZolam (XANAX) 0.5  MG tablet TAKE 1 TABLET BY MOUTH NIGHTLY AS NEEDED   . betamethasone dipropionate (DIPROLENE) 0.05 % cream Apply topically 2 (two) times daily. (Patient taking differently: Apply topically 2 (two) times daily as needed.   )   . HYDROcodone-acetaminophen (NORCO) 5-325 MG per tablet Take 1-2 tablets by mouth every 6 (six) hours as needed for Pain. (Patient taking differently: Take 1-2 tablets by mouth 2 (two) times daily as needed for Pain.   )   . losartan-hydrochlorothiazide (HYZAAR) 100-12.5 MG per tablet TAKE 1 TABLET BY MOUTH DAILY (Patient taking differently: Take 1 tablet by mouth every morning. TAKE 1 TABLET BY MOUTH DAILY  )   . traMADol (ULTRAM) 50 MG tablet Take 1 tablet (50 mg total) by mouth every 8 (eight) hours as needed for Pain.   . diclofenac (VOLTAREN) 25 MG EC tablet TAKE 1 TABLET BY MOUTH ONCE A DAY       Review of Systems:   No chest pain, shortness of breath, dizziness, headache.    Physical Exam:     Filed Vitals:    12/14/15 0836   BP: 113/73   Pulse: 118   Temp: 97.6 F (36.4 C)   Resp: 18   SpO2: 97%       General appearance - alert, well appearing, and in no distress  Cardiovascular- Regular rate and rhythm  Pulmonary- clear to auscultation, no wheezing, no retraction, no stridor, good air exchange.  Musculoskeletal - abnormal exam of left knee. Decreased ROM. SLR intact. NVI   Extremities - peripheral pulses normal, no pedal edema, no clubbing or cyanosis    Labs:     Results     ** No results found for the last 24 hours. **           Rads:   Severe DJD left knee.     Signed by: Santiago Bumpers

## 2015-12-13 NOTE — Pre-Procedure Instructions (Signed)
Pre-op meds order sent to pharmacy.

## 2015-12-14 ENCOUNTER — Inpatient Hospital Stay: Payer: No Typology Code available for payment source

## 2015-12-14 ENCOUNTER — Ambulatory Visit: Payer: Self-pay

## 2015-12-14 ENCOUNTER — Inpatient Hospital Stay: Payer: No Typology Code available for payment source | Admitting: Anesthesiology

## 2015-12-14 ENCOUNTER — Inpatient Hospital Stay
Admission: RE | Admit: 2015-12-14 | Discharge: 2015-12-16 | DRG: 470 | Disposition: A | Discharge status: Home Health | Payer: No Typology Code available for payment source | Source: Ambulatory Visit | Attending: Orthopaedic Surgery | Admitting: Orthopaedic Surgery

## 2015-12-14 ENCOUNTER — Inpatient Hospital Stay: Payer: No Typology Code available for payment source | Admitting: Orthopaedic Surgery

## 2015-12-14 ENCOUNTER — Encounter
Admission: RE | Disposition: A | Discharge status: Home Health | Payer: Self-pay | Source: Ambulatory Visit | Attending: Orthopaedic Surgery

## 2015-12-14 DIAGNOSIS — I10 Essential (primary) hypertension: Secondary | ICD-10-CM | POA: Diagnosis present

## 2015-12-14 DIAGNOSIS — F17210 Nicotine dependence, cigarettes, uncomplicated: Secondary | ICD-10-CM | POA: Diagnosis present

## 2015-12-14 DIAGNOSIS — F419 Anxiety disorder, unspecified: Secondary | ICD-10-CM | POA: Diagnosis present

## 2015-12-14 DIAGNOSIS — G5603 Carpal tunnel syndrome, bilateral upper limbs: Secondary | ICD-10-CM | POA: Diagnosis present

## 2015-12-14 DIAGNOSIS — G629 Polyneuropathy, unspecified: Secondary | ICD-10-CM | POA: Diagnosis present

## 2015-12-14 DIAGNOSIS — M1712 Unilateral primary osteoarthritis, left knee: Principal | ICD-10-CM | POA: Diagnosis present

## 2015-12-14 DIAGNOSIS — M5432 Sciatica, left side: Secondary | ICD-10-CM | POA: Diagnosis present

## 2015-12-14 HISTORY — DX: Difficulty in walking, not elsewhere classified: R26.2

## 2015-12-14 HISTORY — DX: Unspecified visual disturbance: H53.9

## 2015-12-14 HISTORY — DX: Dermatitis, unspecified: L30.9

## 2015-12-14 HISTORY — DX: Carpal tunnel syndrome, bilateral upper limbs: G56.03

## 2015-12-14 HISTORY — DX: Unspecified osteoarthritis, unspecified site: M19.90

## 2015-12-14 HISTORY — PX: ARTHROPLASTY, KNEE, TOTAL: SHX3134

## 2015-12-14 HISTORY — DX: Polyneuropathy, unspecified: G62.9

## 2015-12-14 HISTORY — DX: Disorder of vein, unspecified: I87.9

## 2015-12-14 LAB — TYPE AND SCREEN
AB Screen Gel: NEGATIVE
ABO Rh: O POS

## 2015-12-14 SURGERY — ARTHROPLASTY, KNEE, TOTAL
Anesthesia: Regional | Site: Knee | Laterality: Left | Wound class: Clean

## 2015-12-14 MED ORDER — ASPIRIN 325 MG PO TBEC
325.0000 mg | DELAYED_RELEASE_TABLET | Freq: Two times a day (BID) | ORAL | Status: DC
Start: 2015-12-15 — End: 2015-12-16
  Administered 2015-12-15 – 2015-12-16 (×3): 325 mg via ORAL
  Filled 2015-12-14 (×3): qty 1

## 2015-12-14 MED ORDER — PROPOFOL 10 MG/ML IV EMUL (WRAP)
INTRAVENOUS | Status: AC
Start: 2015-12-14 — End: ?
  Filled 2015-12-14: qty 40

## 2015-12-14 MED ORDER — HYDROCHLOROTHIAZIDE 12.5 MG PO TABS
12.5000 mg | ORAL_TABLET | Freq: Every day | ORAL | Status: DC
Start: 2015-12-15 — End: 2015-12-16
  Administered 2015-12-15 – 2015-12-16 (×2): 12.5 mg via ORAL
  Filled 2015-12-14 (×2): qty 1

## 2015-12-14 MED ORDER — TRANEXAMIC ACID 1000 MG/10ML IV SOLN
1000.0000 mg | Freq: Once | INTRAVENOUS | Status: AC
Start: 2015-12-14 — End: 2015-12-14
  Administered 2015-12-14 (×2): 1000 mg via INTRAVENOUS

## 2015-12-14 MED ORDER — BUPIVACAINE IN DEXTROSE 0.75-8.25 % IT SOLN
INTRATHECAL | Status: DC | PRN
Start: 2015-12-14 — End: 2015-12-14
  Administered 2015-12-14: 1.4 mL via INTRASPINAL

## 2015-12-14 MED ORDER — ACETAMINOPHEN 500 MG PO TABS
ORAL_TABLET | ORAL | Status: AC
Start: 2015-12-14 — End: ?
  Filled 2015-12-14: qty 2

## 2015-12-14 MED ORDER — LOSARTAN POTASSIUM 100 MG PO TABS
1.0000 | ORAL_TABLET | Freq: Every morning | ORAL | Status: DC
Start: 2015-12-15 — End: 2015-12-14

## 2015-12-14 MED ORDER — HYDROMORPHONE HCL 1 MG/ML IJ SOLN
1.0000 mg | INTRAMUSCULAR | Status: DC | PRN
Start: 2015-12-14 — End: 2015-12-16
  Administered 2015-12-15: 1 mg via INTRAVENOUS
  Filled 2015-12-14: qty 1

## 2015-12-14 MED ORDER — LIDOCAINE HCL (PF) 1 % IJ SOLN
INTRAMUSCULAR | Status: AC
Start: 2015-12-14 — End: ?
  Filled 2015-12-14: qty 5

## 2015-12-14 MED ORDER — FENTANYL CITRATE (PF) 50 MCG/ML IJ SOLN (WRAP)
INTRAMUSCULAR | Status: AC
Start: 2015-12-14 — End: ?
  Filled 2015-12-14: qty 2

## 2015-12-14 MED ORDER — PHENYLEPHRINE 100 MCG/ML IN NACL 0.9% IV SOSY
PREFILLED_SYRINGE | INTRAVENOUS | Status: DC | PRN
Start: 2015-12-14 — End: 2015-12-14
  Administered 2015-12-14: 50 ug via INTRAVENOUS
  Administered 2015-12-14: 100 ug via INTRAVENOUS
  Administered 2015-12-14: 150 ug via INTRAVENOUS
  Administered 2015-12-14: 50 ug via INTRAVENOUS
  Administered 2015-12-14: 100 ug via INTRAVENOUS
  Administered 2015-12-14: 50 ug via INTRAVENOUS
  Administered 2015-12-14: 100 ug via INTRAVENOUS

## 2015-12-14 MED ORDER — PROMETHAZINE HCL 25 MG/ML IJ SOLN
6.2500 mg | Freq: Once | INTRAMUSCULAR | Status: DC | PRN
Start: 2015-12-14 — End: 2015-12-14

## 2015-12-14 MED ORDER — SODIUM CHLORIDE BACTERIOSTATIC 0.9 % IJ SOLN
INTRAMUSCULAR | Status: DC | PRN
Start: 2015-12-14 — End: 2015-12-14
  Administered 2015-12-14: 30 mL

## 2015-12-14 MED ORDER — METHYLPREDNISOLONE ACETATE 40 MG/ML IJ SUSP
INTRAMUSCULAR | Status: DC | PRN
Start: 2015-12-14 — End: 2015-12-14
  Administered 2015-12-14: 40 mg

## 2015-12-14 MED ORDER — LOSARTAN POTASSIUM 100 MG PO TABS
100.0000 mg | ORAL_TABLET | Freq: Every day | ORAL | Status: DC
Start: 2015-12-15 — End: 2015-12-16
  Administered 2015-12-15 – 2015-12-16 (×2): 100 mg via ORAL
  Filled 2015-12-14 (×2): qty 1

## 2015-12-14 MED ORDER — CELECOXIB 100 MG PO CAPS
ORAL_CAPSULE | ORAL | Status: AC
Start: 2015-12-14 — End: ?
  Filled 2015-12-14: qty 4

## 2015-12-14 MED ORDER — FENTANYL CITRATE (PF) 50 MCG/ML IJ SOLN (WRAP)
25.0000 ug | Freq: Once | INTRAMUSCULAR | Status: AC
Start: 2015-12-14 — End: 2015-12-14
  Administered 2015-12-14: 50 ug via INTRAVENOUS

## 2015-12-14 MED ORDER — OXYCODONE-ACETAMINOPHEN 5-325 MG PO TABS
2.0000 | ORAL_TABLET | ORAL | Status: DC | PRN
Start: 2015-12-14 — End: 2015-12-16
  Administered 2015-12-14 – 2015-12-16 (×10): 2 via ORAL
  Filled 2015-12-14 (×10): qty 2

## 2015-12-14 MED ORDER — FENTANYL CITRATE (PF) 50 MCG/ML IJ SOLN (WRAP)
25.0000 ug | INTRAMUSCULAR | Status: DC | PRN
Start: 2015-12-14 — End: 2015-12-14

## 2015-12-14 MED ORDER — ONDANSETRON 4 MG PO TBDP
4.0000 mg | ORAL_TABLET | Freq: Three times a day (TID) | ORAL | Status: DC | PRN
Start: 2015-12-14 — End: 2015-12-16

## 2015-12-14 MED ORDER — LACTATED RINGERS IV SOLN
INTRAVENOUS | Status: DC
Start: 2015-12-14 — End: 2015-12-14

## 2015-12-14 MED ORDER — KETOROLAC TROMETHAMINE 30 MG/ML IJ SOLN
INTRAMUSCULAR | Status: DC | PRN
Start: 2015-12-14 — End: 2015-12-14
  Administered 2015-12-14: 30 mg

## 2015-12-14 MED ORDER — PREGABALIN 75 MG PO CAPS
ORAL_CAPSULE | ORAL | Status: AC
Start: 2015-12-14 — End: ?
  Filled 2015-12-14: qty 1

## 2015-12-14 MED ORDER — LACTATED RINGERS IR SOLN
Status: DC | PRN
Start: 2015-12-14 — End: 2015-12-14
  Administered 2015-12-14: 3000 mL

## 2015-12-14 MED ORDER — FERROUS SULFATE 324 (65 FE) MG PO TBEC
324.0000 mg | DELAYED_RELEASE_TABLET | Freq: Every morning | ORAL | Status: DC
Start: 2015-12-14 — End: 2015-12-16
  Administered 2015-12-14 – 2015-12-16 (×3): 324 mg via ORAL
  Filled 2015-12-14 (×3): qty 1

## 2015-12-14 MED ORDER — MORPHINE SULFATE (PF) 10 MG/ML IV SOLN
INTRAVENOUS | Status: DC | PRN
Start: 2015-12-14 — End: 2015-12-14
  Administered 2015-12-14: 10 mg

## 2015-12-14 MED ORDER — CEFAZOLIN SODIUM 10 G IJ SOLR
INTRAMUSCULAR | Status: AC
Start: 2015-12-14 — End: ?
  Filled 2015-12-14: qty 2000

## 2015-12-14 MED ORDER — SODIUM CHLORIDE 0.9 % IV MBP
1.0000 g | Freq: Three times a day (TID) | INTRAVENOUS | Status: AC
Start: 2015-12-14 — End: 2015-12-15
  Administered 2015-12-14 – 2015-12-15 (×2): 1 g via INTRAVENOUS
  Filled 2015-12-14 (×2): qty 1000

## 2015-12-14 MED ORDER — ONDANSETRON HCL 4 MG/2ML IJ SOLN
INTRAMUSCULAR | Status: DC | PRN
Start: 2015-12-14 — End: 2015-12-14
  Administered 2015-12-14: 4 mg via INTRAVENOUS

## 2015-12-14 MED ORDER — DIAZEPAM 5 MG PO TABS
5.0000 mg | ORAL_TABLET | Freq: Three times a day (TID) | ORAL | Status: DC | PRN
Start: 2015-12-14 — End: 2015-12-16

## 2015-12-14 MED ORDER — HYDROCODONE-ACETAMINOPHEN 5-325 MG PO TABS
1.0000 | ORAL_TABLET | ORAL | Status: DC | PRN
Start: 2015-12-14 — End: 2015-12-16
  Administered 2015-12-15: 1 via ORAL
  Filled 2015-12-14: qty 1

## 2015-12-14 MED ORDER — MIDAZOLAM HCL 2 MG/2ML IJ SOLN
INTRAMUSCULAR | Status: AC
Start: 2015-12-14 — End: ?
  Filled 2015-12-14: qty 2

## 2015-12-14 MED ORDER — EPINEPHRINE HCL 1 MG/ML IJ SOLN
INTRAMUSCULAR | Status: AC
Start: 2015-12-14 — End: ?
  Filled 2015-12-14: qty 1

## 2015-12-14 MED ORDER — METHYLPREDNISOLONE ACETATE 40 MG/ML IJ SUSP
INTRAMUSCULAR | Status: AC
Start: 2015-12-14 — End: ?
  Filled 2015-12-14: qty 1

## 2015-12-14 MED ORDER — SODIUM CHLORIDE 0.9 % IV SOLN
INTRAVENOUS | Status: DC
Start: 2015-12-14 — End: 2015-12-14

## 2015-12-14 MED ORDER — NALOXONE HCL 0.4 MG/ML IJ SOLN (WRAP)
0.4000 mg | INTRAMUSCULAR | Status: DC | PRN
Start: 2015-12-14 — End: 2015-12-16

## 2015-12-14 MED ORDER — PROPOFOL 10 MG/ML IV EMUL (WRAP)
INTRAVENOUS | Status: AC
Start: 2015-12-14 — End: ?
  Filled 2015-12-14: qty 20

## 2015-12-14 MED ORDER — LIDOCAINE HCL 2 % IJ SOLN
INTRAMUSCULAR | Status: DC | PRN
Start: 2015-12-14 — End: 2015-12-14
  Administered 2015-12-14: 40 mg

## 2015-12-14 MED ORDER — PROPOFOL INFUSION 10 MG/ML
INTRAVENOUS | Status: DC | PRN
Start: 2015-12-14 — End: 2015-12-14
  Administered 2015-12-14: 30 mg via INTRAVENOUS

## 2015-12-14 MED ORDER — ROPIVACAINE HCL 5 MG/ML IJ SOLN
INTRAMUSCULAR | Status: DC | PRN
Start: 2015-12-14 — End: 2015-12-14
  Administered 2015-12-14: 30 mL

## 2015-12-14 MED ORDER — ROPIVACAINE HCL 5 MG/ML IJ SOLN
INTRAMUSCULAR | Status: AC
Start: 2015-12-14 — End: ?
  Filled 2015-12-14: qty 40

## 2015-12-14 MED ORDER — SODIUM CHLORIDE 0.9 % IV SOLN
INTRAVENOUS | Status: AC
Start: 2015-12-14 — End: ?
  Filled 2015-12-14 (×2): qty 10

## 2015-12-14 MED ORDER — ONDANSETRON HCL 4 MG/2ML IJ SOLN
4.0000 mg | Freq: Three times a day (TID) | INTRAMUSCULAR | Status: DC | PRN
Start: 2015-12-14 — End: 2015-12-16

## 2015-12-14 MED ORDER — ONDANSETRON HCL 4 MG/2ML IJ SOLN
4.0000 mg | Freq: Once | INTRAMUSCULAR | Status: DC | PRN
Start: 2015-12-14 — End: 2015-12-14

## 2015-12-14 MED ORDER — ACETAMINOPHEN 500 MG PO TABS
1000.0000 mg | ORAL_TABLET | Freq: Once | ORAL | Status: AC
Start: 2015-12-14 — End: 2015-12-14
  Administered 2015-12-14: 1000 mg via ORAL

## 2015-12-14 MED ORDER — SODIUM CHLORIDE 0.9 % IJ SOLN
INTRAMUSCULAR | Status: AC
Start: 2015-12-14 — End: ?
  Filled 2015-12-14: qty 30

## 2015-12-14 MED ORDER — MIDAZOLAM HCL 2 MG/2ML IJ SOLN
0.0500 mg/kg | Freq: Once | INTRAMUSCULAR | Status: DC
Start: 2015-12-14 — End: 2015-12-14

## 2015-12-14 MED ORDER — VITAMIN C 500 MG PO TABS
500.0000 mg | ORAL_TABLET | Freq: Every day | ORAL | Status: DC
Start: 2015-12-14 — End: 2015-12-16
  Administered 2015-12-14 – 2015-12-16 (×3): 500 mg via ORAL
  Filled 2015-12-14 (×3): qty 1

## 2015-12-14 MED ORDER — DEXTROSE 5 % IV SOLN
2.0000 g | Freq: Once | INTRAVENOUS | Status: AC
Start: 2015-12-14 — End: 2015-12-14
  Administered 2015-12-14: 2 g via INTRAVENOUS

## 2015-12-14 MED ORDER — ROPIVACAINE HCL 5 MG/ML IJ SOLN
INTRAMUSCULAR | Status: DC | PRN
Start: 2015-12-14 — End: 2015-12-14
  Administered 2015-12-14: 20 mL via PERINEURAL

## 2015-12-14 MED ORDER — HYDROMORPHONE HCL 1 MG/ML IJ SOLN
0.5000 mg | INTRAMUSCULAR | Status: DC | PRN
Start: 2015-12-14 — End: 2015-12-14

## 2015-12-14 MED ORDER — EPINEPHRINE HCL 0.1 MG/ML IJ SOSY
PREFILLED_SYRINGE | INTRAMUSCULAR | Status: DC | PRN
Start: 2015-12-14 — End: 2015-12-14
  Administered 2015-12-14: .125 mg

## 2015-12-14 MED ORDER — PROPOFOL INFUSION 10 MG/ML
INTRAVENOUS | Status: DC | PRN
Start: 2015-12-14 — End: 2015-12-14
  Administered 2015-12-14: 75 ug/kg/min via INTRAVENOUS

## 2015-12-14 MED ORDER — OXYCODONE HCL ER 10 MG PO T12A
10.0000 mg | EXTENDED_RELEASE_TABLET | Freq: Once | ORAL | Status: AC
Start: 2015-12-14 — End: 2015-12-14
  Administered 2015-12-14: 10 mg via ORAL

## 2015-12-14 MED ORDER — LIDOCAINE HCL 2 % EX GEL
CUTANEOUS | Status: AC
Start: 2015-12-14 — End: ?
  Filled 2015-12-14: qty 0.5

## 2015-12-14 MED ORDER — MIDAZOLAM HCL 2 MG/2ML IJ SOLN
2.0000 mg | Freq: Once | INTRAMUSCULAR | Status: AC
Start: 2015-12-14 — End: 2015-12-14
  Administered 2015-12-14 (×2): 2 mg via INTRAVENOUS

## 2015-12-14 MED ORDER — LACTATED RINGERS IV SOLN
75.0000 mL/h | INTRAVENOUS | Status: DC
Start: 2015-12-14 — End: 2015-12-14

## 2015-12-14 MED ORDER — KETOROLAC TROMETHAMINE 30 MG/ML IJ SOLN
INTRAMUSCULAR | Status: AC
Start: 2015-12-14 — End: ?
  Filled 2015-12-14: qty 1

## 2015-12-14 MED ORDER — SODIUM CHLORIDE 0.45 % IV SOLN
INTRAVENOUS | Status: DC
Start: 2015-12-14 — End: 2015-12-16
  Administered 2015-12-14: 100 mL/h via INTRAVENOUS

## 2015-12-14 MED ORDER — OXYCODONE HCL ER 10 MG PO T12A
EXTENDED_RELEASE_TABLET | ORAL | Status: AC
Start: 2015-12-14 — End: ?
  Filled 2015-12-14: qty 1

## 2015-12-14 MED ORDER — DOCUSATE SODIUM 100 MG PO CAPS
100.0000 mg | ORAL_CAPSULE | Freq: Two times a day (BID) | ORAL | Status: DC
Start: 2015-12-14 — End: 2015-12-16
  Administered 2015-12-14 – 2015-12-16 (×4): 100 mg via ORAL
  Filled 2015-12-14 (×4): qty 1

## 2015-12-14 MED ORDER — CELECOXIB 100 MG PO CAPS
400.0000 mg | ORAL_CAPSULE | Freq: Once | ORAL | Status: AC
Start: 2015-12-14 — End: 2015-12-14
  Administered 2015-12-14: 400 mg via ORAL

## 2015-12-14 MED ORDER — OXYCODONE-ACETAMINOPHEN 5-325 MG PO TABS
1.0000 | ORAL_TABLET | Freq: Once | ORAL | Status: DC | PRN
Start: 2015-12-14 — End: 2015-12-14

## 2015-12-14 MED ORDER — SODIUM CHLORIDE 0.9% BAG (IRRIGATION USE)
INTRAVENOUS | Status: DC | PRN
Start: 2015-12-14 — End: 2015-12-14
  Administered 2015-12-14: 1000 mL

## 2015-12-14 MED ORDER — PREGABALIN 75 MG PO CAPS
75.0000 mg | ORAL_CAPSULE | Freq: Once | ORAL | Status: AC
Start: 2015-12-14 — End: 2015-12-14
  Administered 2015-12-14: 75 mg via ORAL

## 2015-12-14 SURGICAL SUPPLY — 93 items
APPLCATOR CHLORAPREP 26ML (Prep) ×2 IMPLANT
BASEPLATE TIB 4 ATTUNE LF STRL CMNT ROT (Base) ×2 IMPLANT
BASEPLATE TIBIAL 4 KNEE CEMENT ROTATE PLATFORM ATTUNE 1506-10-004 (Base) ×1 IMPLANT
BLADE S/SU RIBBACK CARB STL 15 (Blade) ×2 IMPLANT
BLADE SAW CARTRIDGE OSCILLATE TIP (Blade) ×1
BLADE SAW CARTRIDGE OSCILLATE TIP PRECISION FALCON (Blade) ×1 IMPLANT
BLADE SW PRCS FLCN LF STRL CRTDG (Blade) ×1
BNDG ACE ELASTIC 4IN STRL (Procedure Accessories) ×2 IMPLANT
BNDG ACE ELASTIC 6IN STRL (Procedure Accessories) ×4 IMPLANT
CEMENT BN SST STRL HVISC 40GM (Cement) ×2 IMPLANT
CEMENT BONE HIGH VISCOSITY SMARTSET 40 (Cement) ×2 IMPLANT
CEMENT BONE HIGH VISCOSITY SMARTSET 40 GM (Cement) ×2 IMPLANT
CLOTH BEACON TIMEOUT ORANGE (Other) IMPLANT
COMPONENT FEM 4 ATTUNE LF STRL POST STAB (Component) ×1 IMPLANT
COMPONENT FEMORAL 4 KNEE LEFT POSTERIOR STABILIZE CEMENT ATTUNE (Component) ×1 IMPLANT
COMPONENT FEMORAL 4 KNEE LT POSTERIOR (Component) ×1 IMPLANT
COMPONENT PATELLAR H32 MM CEMENT ATTUNE (Component) ×1 IMPLANT
COMPONENT PATELLAR H32 MM CEMENT ATTUNE AOX KNEE MEDIALIZE DOME (Component) ×1 IMPLANT
COMPONENT PTLR AOX MEDIALIZE DOME ATTUNE (Component) ×1 IMPLANT
CONTAINER 32 OZ W LID CLR NLTX (Procedure Accessories) ×1
CONTAINER PATHOLOGY LEAK RESISTANT SNAP (Procedure Accessories) ×1
CONTAINER PATHOLOGY LEAK RESISTANT SNAP LID FREEZABLE MEDLINE (Procedure Accessories) ×1 IMPLANT
DRAPE SRG IOBN 23X17IN STRL 2 INCS FLM (Drape) ×1
DRAPE SRG PLS U STRDRP 51X47IN LF STRL (Drape) ×1
DRAPE SURGICAL 2 INCISE FILM (Drape) ×1 IMPLANT
DRAPE SURGICAL ADHESIVE L51 IN X W47 IN (Drape) ×1
DRAPE SURGICAL ADHESIVE L51 IN X W47 IN STERI-DRAPE CLEAR (Drape) ×1 IMPLANT
DRAPE TABLE 1 PIECE COVER BARRIER (Drape) ×1
DRAPE TABLE 1 PIECE COVER BARRIER PLASTIC PEDIGO PRODUCTS, INC. CLEAR (Drape) ×1 IMPLANT
DRAPE TBL PLS STRL 1 PC CVR BRR DISP CLR (Drape) ×1
DRESSING PETRO 3% BI 3BRM GZE XR 8X1IN (Dressing) ×1
DRESSING PETROLATUM XEROFORM L8 IN X W1 (Dressing) ×1
DRESSING PETROLATUM XEROFORM L8 IN X W1 IN 3% BISMUTH TRIBROMOPHENATE (Dressing) ×1 IMPLANT
GAUZE SPONGE CURITY 12PLY 4X8 (Dressing) ×2 IMPLANT
GLOVE BIOGEL SURG PF SZ 8.5 (Glove) ×4 IMPLANT
GLOVE SURG BIOGEL INDIC SZ 8.5 (Glove) ×2 IMPLANT
HANDPIECE INTERPLUSE HIGH FLOW (Cautery) ×2 IMPLANT
HOLDER CATH VLCR UNV CATH-MATE LF ADJ (Procedure Accessories)
HOLDER CATHETER UNIVERSAL ADJUSTABLE (Procedure Accessories) IMPLANT
HOOD T4 STERI-SHIELD (Personal Protection) ×6 IMPLANT
IMMOBILIZER KNEE UNIVERSAL L19 IN CANVAS (Immobilizer) ×1
IMMOBILIZER KNEE UNIVERSAL L19 IN CANVAS ORTHOPEDIC DEROYAL OD12-24 IN (Immobilizer) ×1 IMPLANT
IMMOBILIZER ORTH CNVS UNV 12-24IN 19IN (Immobilizer) ×1
INACTIVE USE LAWSON 112416 (Drape) ×2 IMPLANT
INSERT TIB AOX 4 ATTUNE 7MM LF STRL POST (Insert) ×1 IMPLANT
INSERT TIBIAL 4 ATTUNE H7 MM KNEE (Insert) ×1 IMPLANT
INSERT TIBIAL 4 ATTUNE H7 MM KNEE POSTERIOR STABILIZE ROTATE PLATFORM (Insert) ×1 IMPLANT
KIT INFECTION CONTROL CUSTOM (Kits) ×2
KIT INFECTION CONTROL CUSTOM IFOH03 (Kits) ×1 IMPLANT
NEEDLE NOKOR FILTER 19GX1.5IN (Needles) IMPLANT
NEEDLE SPINAL DISP 18GX3.5IN (Needles) ×2 IMPLANT
PAD ABD PVC CRTY 9X5IN LF STRL 3 LYR (Dressing) IMPLANT
PAD KNEE SHOULDER COLD SELF SEAL THERAPEUTIC COOLTEMP L11 IN X W10 IN (Other) ×1 IMPLANT
PAD THRP UNV CLTMP 11X10IN LTX CLD SLFSL (Other) ×2
PADDING CAST L4 YD X W4 IN UNDERCAST (Cast) ×1
PADDING CAST L4 YD X W4 IN UNDERCAST MILD STRETCH COHESIVE REGULAR (Cast) ×1 IMPLANT
PADDING CAST L4 YD X W6 IN UNDERCAST (Cast) ×1
PADDING CAST L4 YD X W6 IN UNDERCAST MILD STRETCH COHESIVE WEBRIL (Cast) ×1 IMPLANT
PADDING CST CTTN WBRL 4YDX4IN LF STRL (Cast) ×1
PADDING CST CTTN WBRL 4YDX6IN LF STRL (Cast) ×1
PIN FIXATION THREAD  HEAD HIP LCS SIGMA (Ortho Supply) ×1 IMPLANT
PIN FIXATION THREAD HEAD HIP LCS SIGMA (Ortho Supply) ×1
PIN FIXATION THREAD HEADLESS HIP LCS (Ortho Supply) ×1
PIN FIXATION THREAD HEADLESS HIP LCS SIGMA (Ortho Supply) ×1 IMPLANT
PIN FX LCS SGM STRL THRD HD HIP (Ortho Supply) ×1
PIN FX LCS SGM STRL THRD HDLS HIP (Ortho Supply) ×1
SAWBLADE SIGMA 1/2" SYS 6 (Blade) ×2 IMPLANT
SOLUTION IRR LR 3L ARTHMTC LF PLS CNTNR (Irrigation Solutions) ×1
SOLUTION IRRIGATION LACTATED RINGERS (Irrigation Solutions) ×1
SOLUTION IRRIGATION LACTATED RINGERS 3000 ML PLASTIC CONTAINER (Irrigation Solutions) ×1 IMPLANT
STRIP SKIN CLOSURE L4 IN X W1/2 IN (Dressing) ×2
STRIP SKIN CLOSURE L4 IN X W1/2 IN REINFORCE STERI-STRIP POLYESTER (Dressing) ×2 IMPLANT
STRIP SKNCLS PLSTR STRSTRP 4X.5IN LF (Dressing) ×2
SUTURE ABS 1 CT1 VCL 27IN BRD COAT UD (Suture)
SUTURE ABS 2 TP-1 VCL 54IN BRD COAT UD (Suture) ×1
SUTURE ABS 2-0 CT1 VCL 27IN BRD COAT UD (Suture) ×2
SUTURE COATED VICRYL 1 CT-1 L27 IN BRAID (Suture)
SUTURE COATED VICRYL 1 CT-1 L27 IN BRAID COATED UNDYED ABSORBABLE (Suture) IMPLANT
SUTURE COATED VICRYL 2 TP-1 L54 IN BRAID (Suture) ×1 IMPLANT
SUTURE COATED VICRYL 2-0 CT-1 L27 IN (Suture) ×2
SUTURE COATED VICRYL 2-0 CT-1 L27 IN BRAID COATED UNDYED ABSORBABLE (Suture) ×2 IMPLANT
SUTURE NABSB 3-0 FS1 PRLN 18IN MFL BLU (Suture) ×1
SUTURE PROLENE BLUE 3-0 FS-1 L18 IN (Suture) ×1
SUTURE PROLENE BLUE 3-0 FS-1 L18 IN MONOFILAMENT NONABSORBABLE (Suture) ×1 IMPLANT
SYRINGE 50 ML GRADUATE NONPYROGENIC DEHP (Syringes, Needles) ×1
SYRINGE 50 ML GRADUATE NONPYROGENIC DEHP FREE PVC FREE LOK MEDICAL (Syringes, Needles) ×1 IMPLANT
SYRINGE MED 50ML LL LF STRL GRAD N-PYRG (Syringes, Needles) ×1
SYSTEM BN CMNT OPTVC STRL LNG CRTDG NOZ (Ortho Supply) ×1
SYSTEM BONE CEMENT LONG CARTRIDGE NOZZLE (Ortho Supply) ×1
SYSTEM BONE CEMENT LONG CARTRIDGE NOZZLE 80 GM OPTIVAC (Ortho Supply) ×1 IMPLANT
TOWEL L26 IN X W17 IN COTTON PREWASH DELINT BLUE ACTISORB DELUXE (Procedure Accessories) IMPLANT
TOWEL SRG CTTN 26X17IN LF STRL PREWASH (Procedure Accessories)
TRAY TOTAL KNEE FFX (Pack) ×2 IMPLANT

## 2015-12-14 NOTE — Plan of Care (Signed)
Problem: Day of Surgery- Knee Surgery  Goal: Hemodynamic Stability  Outcome: Progressing  Blood pressure 121/78, pulse 100, temperature 96.2 F (35.7 C), temperature source Oral, resp. rate 20, height 1.524 m (5'), weight 86.637 kg (191 lb), SpO2 94 %.  Pt reportedly tachycardic prior to surgery. Pt educated on proper use of IS and states compliance.    Goal: Stable Neurovascular Status  Outcome: Progressing  Pt Neurovascularly intact. Pt states tingling and return of sensation bital LE. Pt educated on the increased need of ankle pumping due to IV in R foot. Pt agreed to compliance.

## 2015-12-14 NOTE — Transfer of Care (Signed)
Anesthesia Transfer of Care Note    Patient: [name]    Procedures performed: Procedure(s) with comments:  ARTHROPLASTY, KNEE, TOTAL - LEFT TOTAL KNEE ARTHROPLASTY    Anesthesia type: Spinal    Patient location:Phase I PACU    Last vitals:   Filed Vitals:    12/14/15 1020   BP: 112/69   Pulse: 112   Temp:    Resp:    SpO2: 97%       Post pain: Patient not complaining of pain, continue current therapy      Mental Status:awake    Respiratory Function: tolerating nasal cannula    Cardiovascular: stable    Nausea/Vomiting: patient not complaining of nausea or vomiting    Hydration Status: adequate     Post assessment: no apparent anesthetic complications    Signed by: Janan Halter  12/14/2015 12:29 PM

## 2015-12-14 NOTE — PT Eval Note (Signed)
[facility]  Physical Therapy Evaluation    Patient: [name] MRN: [id number]   Unit: 5NEW ORTHOPEDICS    Bed: V409/W119-14    Medicare ID # - Patient is not covered by Medicare.     Assessment:   Examination - Assessment: Decreased LE ROM;Decreased LE strength;Decreased endurance/activity tolerance;Decreased functional mobility;Decreased balance;Gait impairment, resulting in difficulty with ambulation.      Co-morbidities/Patient factors affecting plan of care - None    Clinical factors affecting plan of care - None    Prognosis: Good;With continued PT status post acute discharge    Interdisciplinary Communication:   Pt left up in chair with alarm activated.  Updated white communication board in room with patient's current mobility status.  Communicated with Thayer Ohm, RN via FTF regarding pt's functional mobility status.      Plan:   Recommendation  Discharge Recommendation: Home with outpatient PT  DME Recommended for Discharge: Front wheel walker  PT - Next Visit Recommendation: 12/15/15  PT Frequency: 7x/wk    Patient Goal: Return home     Risks/Benefits/POC Discussed with Pt/Family: With patient       Treatment/Interventions: Exercise;Gait training;Stair training;Neuromuscular re-education;Functional transfer training;LE strengthening/ROM;Endurance training         Goals  Goal Formulation: With patient  Time for Goal Acheivement: By time of discharge  Goals: Select goal  Pt Will Perform Sit to Stand: independent  Pt Will Ambulate: 101-150 feet;with rolling walker;independent  Pt Will Go Up / Down Stairs: 3-5 stairs;with minimal assist;With rail  Pt Will Perform Home Exer Program: with minimal assist           Recommended mode of transportation at discharge:  Car    Education:   Educated patient/caregiver to role of physical therapy, plan of care, transfer training techniques, falls prevention, gait training techniques with RW, home safety, next appropriate level of care.       Patient/caregiver demonstrated good understanding.      Discussed goals of therapy with patient/caregiver, in agreement with plan.        Evaluation:   Consult received for [name] for PT evaluation and treatment.  Chart reviewed.  Patient's medical condition is appropriate for Physical Therapy intervention at this time.     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 female admitted on 12/14/2015 with L TKR      Patient Active Problem List   Diagnosis   . Neck pain   . Low back pain   . Essential hypertension   . Primary osteoarthritis of both knees   . Anxiety   . Left knee DJD     Past Medical History   Diagnosis Date   . Neck pain      related to lower back but significant at this time    . Hypertensive disorder      stable meds   . 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    . Arthritis      bilateral knees   . Difficulty walking      limps   . Lower back pain      sciatica radiates  down LLE: Pain range= 0-10   . Neuropathy      numbness upon awakening from low back radiating down LLE .[name]. diminishes after awhile after moving around   . Eczema      has prescription cream   . Carpal tunnel syndrome on both sides      stiffness noted    . Abnormal vision      glasses   . Anxiety      uses xanax @ bedtime     Past Surgical History   Procedure Laterality Date   . Ganglion cyst excision Right 2007   . Knee arthroscopy Right 09/24/2014   . Knee arthroscopy Left 03/2015   . Colonoscopy  2016     X1 polyp   . Induced abortion     . Vaginal delivery       X2 spinals ( 4natural deliveries)       Prior Level of Function  Prior level of function: Independent with ADLs;Ambulates  independently  Baseline Activity Level: Community ambulation  Driving: independent  Cooking: Yes  Employment: FT  DME Currently at Home: Crutches      Home Living Arrangements  Living Arrangements: Family members  Type of Home: House  Home Layout: Two level;Stairs to enter without rails (add number in comment) (7 STE without Rails, 8 w/ bilateral rails to bed)  DME Currently at Home: Crutches        Subjective: Patient is agreeable to participation in the therapy session.   Pain Assessment  Pain Assessment: No/denies pain          Objective:  Patient is in bed with peripheral IV in place.    Observation of patient/vitals     Inspection/Posture  Inspection/Posture: WFL  Cognition/Neuro Status  Arousal/Alertness: Appropriate responses to stimuli  Attention Span: Appears intact  Orientation Level: Oriented X4  Memory: Appears intact  Following Commands: Follows all commands and directions without difficulty  Safety Awareness: independent  Insights: Fully aware of deficits  Problem Solving: Able to problem solve independently  Behavior: attentive;calm;cooperative        Musculoskeletal Examination  Gross ROM  Right Lower Extremity ROM: within functional limits  Left Lower Extremity ROM:  (Decreased L Knee)                                                          Gross Strength  Right Lower Extremity Strength: within functional limits  Left Lower Extremity Strength: 3-/5                Sensation: grossly intact        Functional Mobility  Rolling: Independent  Supine to Sit: Independent  Scooting to EOB: Independent  Sit to Stand: Minimal Assist  Stand to Sit: Minimal Assist    Transfers  Bed to Chair: Minimal Assist  Device Used for Functional Transfer: front-wheeled walker  Balance  Mueller - Static: Good  Mueller - Dynamic: Fair    AM-PAC Basic Mobility                         Locomotion  Ambulation: Minimal Assist;with front-wheeled walker  Ambulation Distance (Feet): 30 Feet  Pattern: L decreased stance  time;decreased cadence;decreased step length       Participation and Endurance  Participation Effort: excellent  Endurance: Tolerates 30 min exercise with multiple rests       G codes  Based on AM-PAC Current:         Goal:         Discharge:    Time Calculation  PT Received On: 12/14/15  Start Time: 1520  Stop Time: 1540  Time Calculation (min): 20 min    Signature:  Jacqulyn Cane, PT, DPT

## 2015-12-14 NOTE — Progress Notes (Signed)
Block procedure completed with sedation. Time out performed with anesthesiologist. Patient tolerated the procedure with no adverse side effects. Procedure discussed prior to start. Peripheral nerve block education initiated, including fall precautions/sensory deficits and pain management.  Patient and responsible adult verbalized understanding.

## 2015-12-14 NOTE — Brief Op Note (Signed)
BRIEF OP NOTE    Date Time: 12/14/2015 12:09 PM    Patient Name:   [name]    Date of Operation:   12/14/2015    Providers Performing:   Surgeon(s):  Cynda Familia, MD  Santiago Bumpers, PA    Assistant (s):    Operative Procedure:   Procedure(s):  ARTHROPLASTY, KNEE, TOTAL    Preoperative Diagnosis:   Pre-Op Diagnosis Codes:     * Primary localized osteoarthritis of left knee [M17.12]    Postoperative Diagnosis:   * No post-op diagnosis entered *    Anesthesia:   Regional    Estimated Blood Loss:    100cc, Tourn Time: 1h    Implants:     Implant Name Type Inv. Item Serial No. Manufacturer Lot No. LRB No. Used Action   CMNT BONE HIVISCOCTY 40GR EA=1 - [id number] Cement CMNT BONE HIVISCOCTY 40GR EA=1  [employer] [id number] Left 1 Implanted   CMNT BONE HIVISCOCTY 40GR EA=1 - [id number] Cement CMNT BONE HIVISCOCTY 40GR EA=1  [employer] [id number] Left 1 Implanted   DOME PAT ATN MED CMNTD - [id number] Component DOME PAT ATN MED CMNTD  [employer] [id number] Left 1 Implanted   COMPONENT FMRL ATUNE PS SZ4 LT - [id number] Component COMPONENT FMRL ATUNE PS SZ4 LT  [employer] [id number] Left 1 Implanted   INSERT ATTN TIB RP SZ4 - [id number] Insert INSERT ATTN TIB RP SZ4  [employer] [id number] Left 1 Implanted   BASEPLATE TIBIAL ATTN ROT SZ4 - [id number] Base BASEPLATE TIBIAL ATTN ROT SZ4   [employer] [id number] Left 1 Implanted       Drains:   Drains: no    Specimens:        SPECIMENS (last 24 hours)      Pathology Specimens       12/14/15 1100             Specimen Information    Specimen Testing Required Gross Only       Specimen ID  [id number]       Specimen Description LEFT KNEE BONE AND SOFT TISSUE           Findings:   DJD, varus knee    Complications:   None      Signed by: Cynda Familia, MD                                                                               MAIN OR

## 2015-12-14 NOTE — Op Note (Signed)
Procedure Date: 12/14/2015     Patient Type: I     SURGEON: Cynda Familia MD  ASSISTANT:  Marvia Pickles PA     PREOPERATIVE DIAGNOSIS:  Degenerative arthritis, left knee.     POSTOPERATIVE DIAGNOSIS:  Degenerative arthritis, left knee.     TITLE OF PROCEDURE:    Left total knee replacement.     ANESTHETIC:  Spinal with adductor canal femoral nerve block.     TOURNIQUET TIME:  One hour and 20 minutes.     BLOOD LOSS:  100 mL.     SUMMARY OF IMPLANTS:  We used the DePuy Attune total knee system, size 4 left posterior  stabilized femur, size 4 tibia, a size 4 x 7 mm polyethylene spacer, and a  32-mm 3-peg polyethylene button.     FINDINGS:  The patient had advanced degenerative arthritis with a varus deformity.     DESCRIPTION OF PROCEDURE:  After properly identifying the patient, she was given 2 grams of Ancef  intravenously as well as a gram of TXA.  She had an adductor canal femoral  nerve block performed by anesthesia services.  She was then brought to the  operating room where a spinal anesthetic was induced.  She was then placed  in supine position on the operative table.  Once there, she was sedated and  the left lower extremity was prepped and draped in the usual sterile  manner.  Surgical pause by the operative team resulted in all members  agreeing to the procedure, site and patient.  The extremity was  exsanguinated with an Esmarch wrap and the upper thigh tourniquet inflated  to 325 mmHg.  A 7-inch incision was made on the knee, centered on the  patella.  A medium parapatellar tendon approach was made to the knee.  I  released the medial collateral ligament off the proximal tibia around to  the posteromedial corner and excised the fat pad from the knee.  An  intramedullary guide was used to position the distal femoral cutting block  at 6 degrees of valgus, 10 mm.  I used an external alignment system to  position the proximal tibial cutting guide.  Once this wafer of bone was  removed, we inserted the  laminar spreader and debrided remnants of bone,  meniscus and other soft tissues.  We then had proper bony correction and  soft tissue balance in extension with the 7-mm spacer.  The femur was then  sized to a size 4 and the 3-in-1 cutting guide was positioned using a  posterior referencing system in 3 degrees of external rotation.  After  completing the anterior, posterior, and chamfer cuts, we cut the notch for  the posterior stabilized femur.  Turning our attention to the tibia, we  sized this to a size 4, prepared it for the finned tray of the tibial  system.  Patella was grasped with towel clips, cut freehand, sized to a 32  mm implant.  Trial components were inserted.  We had excellent range of  motion and stability and correction of the deformity throughout an arc of  movement from 5 degrees of hyperextension to 130 degrees of flexion.  Trial  components were removed.  We irrigated and dried the bony surfaces using  pulse lavage.  We opened the components, mixed the cement, and then  cemented the components in place, first the tibia, then femur, and finally  patella.  We removed excess cement at each stage.  We positioned  the  polyethylene spacer on the tibial tray once the femur was cemented.  Knee  was held in extension until the cement hardened.  As this occurred, I  injected the intraoperative cocktail of Duramorph, ropivacaine, Toradol,  epinephrine, Depo-Medrol all mixed in normal saline throughout the wound.   The knee was irrigated with the remainder of 3 liters of the pulse lavage.   Once the cement hardened, we did a final check of range of motion and then  closed the knee in layers.  Sterile dressings were applied.  Tourniquet was  released.  We then applied the cooling blanket, Ace wrap and knee  immobilizer.  Upon removal of the drapes, the toes were noted to be pink  and warm, less than 3 second capillary refill, so the patient was  transferred to the hospital bed where she was awakened and  transferred on  then to the recovery room in stable, satisfactory, and awake condition.   Final sponge and needle counts were reported as correct.     NEED FOR ASSISTANCE:  Mr. Renato Gails assistance was essential in providing exposure throughout the  case, helping with the preparation of the bone and insertion of the  Implants. He was also present for wound closure and dressing  application.           D:  12/14/2015 12:24 PM by Dr. Cynda Familia, MD [phone])  T:  12/14/2015 15:54 PM by       Everlean Cherry: [id number]) (Doc ID: [id number])

## 2015-12-14 NOTE — Progress Notes (Signed)
Pt admitted to the floor from PACU.  Oriented pt to room and bed.  Call bell within reach.  Instructed to call for help prior to getting OOB.  Family at the beside.

## 2015-12-14 NOTE — Anesthesia Postprocedure Evaluation (Signed)
Anesthesia Post Evaluation    Patient: [name]      Procedures performed: Procedure(s) with comments:  ARTHROPLASTY, KNEE, TOTAL - LEFT TOTAL KNEE ARTHROPLASTY      Anesthesia type: Spinal      Patient location:Phase I PACU      Last vitals:    Vitals   Filed Vitals:      12/14/15 1020    BP:  112/69    Pulse:  112    Temp:      Resp:      SpO2:  97%             Post pain: Patient not complaining of pain, continue current therapy                 Mental Status:awake      Respiratory Function: tolerating nasal cannula      Cardiovascular: stable      Nausea/Vomiting: patient not complaining of nausea or vomiting      Hydration Status: adequate       Post assessment: no apparent anesthetic complications

## 2015-12-14 NOTE — Anesthesia Procedure Notes (Addendum)
Peripheral  Patient location during procedure: Pre-Op  Reason for block: Post-op pain managment  Injection technique: Single-shot  Block Region: Adductor canal/Mid-thigh femoral  Laterality: Left  Block at surgeon's request Yes  Start time: 12/14/2015 10:02 AM  End time: 12/14/2015 10:09 AM    Staffing  Anesthesiologist: Eudelia Bunch  Performed by: Anesthesiologist     Pre-procedure Checklist   Completed: patient identified, surgical consent, pre-op evaluation, timeout performed, risks and benefits discussed, anesthesia consent given and correct site  Timeout Completed:  12/14/2015 10:00 AM    Peripheral Block  Patient monitoring: Pulse oximetry, EKG, NIBP and Nasal cannula O2  Patient position: Supine  Sterile Technique: Chloraprep, Sterile gloves and Mask  Premedication: Meaningful contact maintained and Yes  Local infiltration: Lidocaine 2%    Needle  Needle type: Stim needle   Needle gauge: 21 G  Needle length: 4 in    Procedures: ultrasound guided  Ultrasound Guided: LA spread visualized, Needle visualized, Relevant anatomy identified (nerve, vessels, muscle) and Image stored or printed      Assessment   Incremental injection: yes  Injection made incrementally with aspirations every 3 mL.  Injection Resistance: no  Paresthesia Pain: No    Blood Aspirated: No  no suspected intravascular injection  Patient tolerated procedure well: Yes  Block Outcome: No complications and Successful block    Spinal    Patient location during procedure: OR  Reason for block: Primary Anesthesia In the OR    Block at Surgeon's request: Yes      Start time: 12/14/2015 10:27 AM    End time: 12/14/2015 10:32 AM    Staffing  Anesthesiologist: Eudelia Bunch  Resident/CRNA: Langston Reusing D  Performed by: resident/CRNA       Pre-procedure Checklist   Completed: patient identified, surgical consent, pre-op evaluation, timeout performed, risks and benefits discussed, monitors and equipment checked, anesthesia consent given and correct  site  Timeout Completed:  12/14/2015 10:26 AM    Spinal  Patient monitoring: pulse oximetry, NIBP and nasal cannula O2    Premedication: Yes and Meaningful Contact Maintained    Patient position: sitting    Skin Local: lidocaine 2%        Approach: midline  Number of attempts: 1      Needle Placement  Needle type: Whitacre tip   Needle gauge: 25              Paresthesia Pain: no    Catheter Placement   CSF Return: Yes  Blood Return: No              Assessment   Sensory level: T6  Block Outcome: no complications, patient tolerated procedure well and successful block

## 2015-12-15 ENCOUNTER — Encounter: Payer: Self-pay | Admitting: Orthopaedic Surgery

## 2015-12-15 LAB — CBC
Hematocrit: 33.6 % — ABNORMAL LOW (ref 37.0–47.0)
Hgb: 10.8 g/dL — ABNORMAL LOW (ref 12.0–16.0)
MCH: 30 pg (ref 28.0–32.0)
MCHC: 32.1 g/dL (ref 32.0–36.0)
MCV: 93.3 fL (ref 80.0–100.0)
MPV: 11.5 fL (ref 9.4–12.3)
Nucleated RBC: 0 /100 WBC (ref 0–1)
Platelets: 251 10*3/uL (ref 140–400)
RBC: 3.6 10*6/uL — ABNORMAL LOW (ref 4.20–5.40)
RDW: 13 % (ref 12–15)
WBC: 13.03 10*3/uL — ABNORMAL HIGH (ref 3.50–10.80)

## 2015-12-15 LAB — LAB USE ONLY - HISTORICAL SURGICAL PATHOLOGY

## 2015-12-15 LAB — BASIC METABOLIC PANEL
Anion Gap: 8 (ref 5.0–15.0)
BUN: 15 mg/dL (ref 7–19)
CO2: 25 mEq/L (ref 22–29)
Calcium: 9.1 mg/dL (ref 8.5–10.5)
Chloride: 108 mEq/L (ref 100–111)
Creatinine: 0.8 mg/dL (ref 0.6–1.0)
Glucose: 137 mg/dL — ABNORMAL HIGH (ref 70–100)
Potassium: 4.1 mEq/L (ref 3.5–5.1)
Sodium: 141 mEq/L (ref 136–145)

## 2015-12-15 LAB — GFR: EGFR: >60

## 2015-12-15 NOTE — PT Progress Note (Signed)
Physical Therapy Note    [facility]  Physical Therapy Treatment    Patient:  [name] MRN#:  [id number]  Unit:  5NEW ORTHOPEDICS Room/Bed:  V409/W119-14    Assessment:   Patient is calm yet in extreme pain. AAROM knee flexion to 50 degrees after multiple reps. ENcouraged regular AP, QS and heelslides to improve circulation minimize edema and related pain.   Assessment: Decreased LE ROM;Decreased LE strength;Decreased endurance/activity tolerance;Decreased functional mobility;Gait impairment, resulting in continued difficulty with functional ROM Strength and endurance.  Prognosis: Good;With continued PT status post acute discharge    Interdisciplinary Communication:   Patient up in restroom with PSA, ice provided; call bell within reach. Updated white communication board in room with patient's current mobility status: assist x 1 with RW and BRACE.  Communicated with nurse regarding pain level and request for breakthrough pain medication    Education:   Further education provided to patient on HEP, safety with mobility and ADLs, falls prevention.  Demonstrated good understanding with all information and cues.  Will benefit from further training to focus on functional ROM Strength and endurance to maximize mobility safety and independence      Plan:      Plan  Treatment/Interventions: Exercise;Gait training;Stair training  PT Frequency: 7x/wk    Continue plan of care.    Recommendation  Discharge Recommendation: Home with outpatient PT  DME Recommended for Discharge:  (adjusted RW to proper height)  PT Frequency: 7x/wk      Recommended mode of transportation at discharge:   car    ----------------------------------------------------------------------------------------------------------------------    Time of treatment:   Start Time: 1325 Stop Time: 1353  Time Calculation (min): 28 min  PT Received On: 12/15/15      Treatment # 2      Precautions  Weight Bearing Status: no restrictions  Total Knee  Replacement: knee immobilizer;OOB  Other Precautions: Falls          Patient's medical condition is appropriate for Physical Therapy intervention at this time.   LPTA reviewed physical therapist's evaluation prior to treatment session.    Subjective: Patient is agreeable to participation in the therapy session. Nursing clears patient for therapy.      Pain Assessment  Pain Assessment:  (10/10 with exercises, 8/10 post gait training)          Objective:  Patient is seated in a bedside chair with dressings in place.       Functional Mobility  Sit to Stand: Supervision;Increased Time  Stand to Sit: Supervision         Locomotion  Ambulation: Stand by Assist;with front-wheeled walker  Ambulation Distance (Feet): 100 Feet  Pattern: L decreased stance time;L foot decreased clearance;decreased cadence;decreased step length;Step to (antalgic, adopted step length cues well)    Therapeutic Exercise  Straight Leg Raise: 10  Quad Sets: 10  Heelslides: 10  Glute Sets: 10  Ankle Pumps: 25  Strengthening:  (technique cues throughout, encouraged AROM to reduce pain)  Signature: Marrian Salvage, PTA

## 2015-12-15 NOTE — Progress Notes (Signed)
Ortho Note  POD # : 1  Subjective:     Patient ID: [name] is a 60 y.o. female.    Chief Complaint:  S/p left TKA.  Patient states she is doing well. Pain is controlled on percocet.  She was able to transfer with minimal assist and ambulate 30 feet yesterday.  No complaints.        Objective:     Filed Vitals:    12/15/15 0738   BP: 121/76   Pulse: 94   Temp: 97.5 F (36.4 C)   Resp: 18   SpO2: 99%       Gen: Alert and oriented, cooperative  Extremities:  Dressing c/d/i, 2+ pedal pulses, full ROM with foot pumping and toe movement, 1+ edema, No calf tenderness  Neurological:  Sensation intact to light touch    Results     Procedure Component Value Units Date/Time    Basic metabolic panel [[id number]]  (Abnormal) Collected:  12/15/15 0538    Specimen Information:  Blood Updated:  12/15/15 0609     Glucose 137 (H) mg/dL      BUN 15 mg/dL      Creatinine 0.8 mg/dL      Calcium 9.1 mg/dL      Sodium 478 mEq/L      Potassium 4.1 mEq/L      Chloride 108 mEq/L      CO2 25 mEq/L      Anion Gap 8.0     GFR [[id number]] Collected:  12/15/15 0538     EGFR >60.0 Updated:  12/15/15 0609    CBC [[id number]]  (Abnormal) Collected:  12/15/15 0538    Specimen Information:  Blood from Blood Updated:  12/15/15 0600     WBC 13.03 (H) x10 3/uL      Hgb 10.8 (L) g/dL      Hematocrit 95.2 (L) %      Platelets 251 x10 3/uL      RBC 3.60 (L) x10 6/uL      MCV 93.3 fL      MCH 30.0 pg      MCHC 32.1 g/dL      RDW 13 %      MPV 11.5 fL      Nucleated RBC 0 /100 WBC     Type and Screen [[id number]] Collected:  12/14/15 0944    Specimen Information:  Blood Updated:  12/14/15 1031     ABO Rh O POS      AB Screen Gel NEG           Assessment:     Doing well postoperatively.      Plan:     Continue pain management, DVT prophylaxis, and therapy.  Plan for [place] home tomorrow.        Posey Pronto

## 2015-12-15 NOTE — Plan of Care (Signed)
Problem: Day 1 Post-op- Knee Surgery  Goal: Stable Neurovascular Status  Outcome: Not Progressing  Pt denies any numbness of tingling to Bilateral upper and lower extremities. Pt is not able to properly lift her left leg d/t pain at the surgery site. Administered pain medication

## 2015-12-15 NOTE — Plan of Care (Signed)
Problem: Day 1 Post-op- Knee Surgery  Goal: Hemodynamic Stability  Outcome: Progressing  Patient ambulating in room and hallway with walker and minimal assist, voiding adequate amounts of urine in bathroom, tolerating diet, pain controlled with prn percocet , no neurovascular deficit noted, call bell and bedside table within reach, bed alarm on, safety precautions discussed with patient, demonstrated an understanding of the knowledge, environmental safety ensured, hourly rounding in progress, in bed resting, will continue to monitor patient.

## 2015-12-15 NOTE — UM Notes (Signed)
NAME:  [name], [name]  DOB:  01-30-1956       PMH:    Past Medical History   Diagnosis Date   . Neck pain      related to lower back but significant at this time    . Hypertensive disorder      stable meds   . 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    . Arthritis      bilateral knees   . Difficulty walking      limps   . Lower back pain      sciatica radiates down LLE: Pain range= 0-10   . Neuropathy      numbness upon awakening from low back radiating down LLE .Marland Kitchen. diminishes after awhile after moving around   . Eczema      has prescription cream   . Carpal tunnel syndrome on both sides      stiffness noted    . Abnormal vision      glasses   . Anxiety      uses xanax @ bedtime          ELECTIVE PROCEDURE DONE 12/14/15  ARTHROPLASTY, KNEE, TOTAL  Cpt code:  [address]   12/14/15 0806  Admit to Inpatient        Admitted to surgical unit post-op.       ---------------12/15/15--------------  BP: 121/76   Pulse: 94   Temp: 97.5 F (36.4 C)   Resp: 18   SpO2: 99%                 prns given 3/29 as of 6pm:  Dilaudid 1mg  IV x 1, percocet 2 tabs x 3, norco 1 tab x 1.    Plan:   Continue pain management, DVT prophylaxis, and therapy.         Continue IVF at 100 ml/hr         _____________________________________________      ** This clinical review is compiled from documentation provided by the treatment team in the medical record. **    _____________________________________________    Lauretta Grill) Lucretia Roers, RN, BSN, BS  Utilization Review Case Manager  Case Management Department  [place] [facility]  [address]  [place], [place] [address]  Phone:  [phone]  Fax:  [phone]  Case Management Main Phone:  [phone]    [facility] Tax ID:  [id number]  NPI:  [id number]    Please use fax number [phone] to provide authorization for hospital services or to request additional information.  ------------------------------------------------------------------------

## 2015-12-15 NOTE — OT Eval Note (Signed)
[facility]   Occupational Therapy Evaluation     Patient: [name]    MRN#: [id number]   Unit: 5NEW ORTHOPEDICS  Bed: V409/W119-14    Time of treatment: Time Calculation  OT Received On: 12/15/15  Start Time: 1005  Stop Time: 1045  Time Calculation (min): 40 min    Consult received for [name] for OT Evaluation and Treatment.  Patient's medical condition is appropriate for Occupational therapy intervention at this time.    Medicare ID# - Patient is not covered by Medicare.    Assessment:   Pt is close to baseline with ADLs.  Knee immobilizer presents as a minor barrier to dressing.  Patient required min cues for sequencing dressing and cues for safety when showering. No con't OT needs.   Discharge from therapy.    Interdisciplinary Communication   Patient is in bedside chair and call bell/ chair alarm set within reach.  Spoke with RN regarding results of evaluation.    Plan:   Discharge from Occupational Therapy.    Education:   Ed pt on role of OT and that they have no further needs.  Pt in agreement with discharge.    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 female admitted on 12/14/2015 with L TKR    Patient Active Problem List   Diagnosis   . Neck pain   . Low back pain   . Essential hypertension   . Primary osteoarthritis of both knees   . Anxiety   . Left knee DJD        Past Medical/Surgical History:  Past Medical History   Diagnosis Date   . Neck pain      related to lower back but significant at this time    . Hypertensive disorder      stable meds   . 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    . Arthritis      bilateral knees   . Difficulty walking       limps   . Lower back pain      sciatica radiates down LLE: Pain range= 0-10   . Neuropathy      numbness upon awakening from low back radiating down LLE .Marland Kitchen. diminishes after awhile after moving around   . Eczema      has prescription cream   . Carpal tunnel syndrome on both sides      stiffness noted    . Abnormal vision      glasses   . Anxiety      uses xanax @ bedtime      Past Surgical History   Procedure Laterality Date   . Ganglion cyst excision Right 2007   . Knee arthroscopy Right 09/24/2014   . Knee arthroscopy Left 03/2015   . Colonoscopy  2016     X1 polyp   . Induced abortion     . Vaginal delivery       X2 spinals ( 4natural deliveries)         Social History/Prior level of function:          Orientation/Cognition:  A and O x 4    Pain: 5/10    Gross UE ROM: WFL    Gross UE strength: WFL    ADLs: supervision to Mod I - pt will be returning to home with assistance from daughters as needed    Functional mobility: Mod I with knee immobilizer and RW in room/bathroom            G codes based on AM-PAC    Current:    Goal:    Discharge:      Signature: Eudelia Bunch, OT 12/15/2015

## 2015-12-15 NOTE — Plan of Care (Signed)
Problem: Safety  Goal: Patient will be free from injury during hospitalization  Outcome: Progressing  Reinforced call bell use, pt aware. Call bell and sidetable within reach, bed low and alarm on. Will cont to monitor.        Problem: Day 1 Post-op- Knee Surgery  Goal: Pain at adequate level as identified by patient  Outcome: Progressing  Pain managed w/ prn percocet and ice packs. Will cont to monitor.  Goal: Stable Neurovascular Status  Outcome: Progressing  Denies numbness or tingling, full sensation and good strength in lower extremities. Foot pump and TEDs on.  Goal: Mobility/activity is maintained at optimum level for patient  Outcome: Progressing  Pt ambulating OOB to BR, chair, and hall. Steady gait w/ walker.

## 2015-12-15 NOTE — Progress Notes (Signed)
Patient interviewed and denies any anesthesia complications such as nausea, vomiting or sore throat.    Post-op Day 1    Date:  12/15/2015 Time:  9:59 AM      Visit Type:  Inpatient Room #   [phone]    Subjective:  No complaints    Type of block: Single Shot Adductor Canal  Surgery: Left TKA    Objective:  Pain Score  3                    Motor Block  No                    Sensory Block  No                    Catheter site  N/A    Assessment:  Surgical pain controlled    Plan:  No further management    Would pt receive block again?  Yes

## 2015-12-15 NOTE — Progress Notes (Signed)
12/15/15 1825   Patient Type   Within 30 Days of Previous Admission? No   Medicare focused diagnosis patient? Not a Medicare focused diagnosis patient   Bundle patient? Not a bundle patient   Healthcare Decisions   Interviewed: Patient   Orientation/Decision Making Abilities of Patient Alert and Oriented x3, able to make decisions   Prior to admission   Have running water, electricity, heat, etc? Yes   How do you get to your MD appointments? Self/Family   How do you get your groceries? Self/Family   Who fixes your meals? Self/Family   Who does your laundry? Self/Family   Who picks up your prescriptions? Self   Home Care/Community Services None   Adult Protective Services (APS) involved? No   Discharge Planning   Anticipated [place] plan discussed with: Patient   Follow up appointment scheduled? (F/U with surgeon)   Mode of transportation: Private car (family member)   Consults/Providers   Outcome Palliative Care Screen (N/a)   Important Message from Medicare Notice   Patient received 1st IMM Letter? n/a   Met with the patient and confirmed demographics. Educated the patient on the role of Case Management.  Informed that she lives with her father who will be home with her after discharge. Other family members will also be avaialble for support after discharge. She was functional prior to admission. She will transition to OP physical  therapy at discharge. She is scheduled to start on 12/20/15. She will need a rolling walker at discharge. She will be referred to the [facility] for final discharge arrangements. Case Management will continue to monitor.

## 2015-12-15 NOTE — PT Progress Note (Signed)
Physical Therapy Note    [facility]  Physical Therapy Treatment    [address]  [place] [address]  [phone]    Patient:  [name] MRN#:  [id number]  Unit:  5NEW ORTHOPEDICS Room/Bed:  A213/Y865-78    Time of treatment: Start Time: 0800 Stop Time: 0825  Time Calculation (min): 25 min  PT Received On: 12/15/15    Treatment # 1    Precautions  Weight Bearing Status: no restrictions  Total Knee Replacement: knee immobilizer  Other Precautions: Falls    Patient's medical condition is appropriate for Physical Therapy intervention at this time.     Subjective:  Patient is agreeable to participation in the therapy session.  Pain: 5/10, RN made aware, will medicate pt.    Objective:  Patient is seated in a recliner chair with no medical equipment in place.    Exercise: ankle pumps, quad sets, gluteal sets, and heel slides.    Sit to stand: Sup x 1    Ambulation: 150 feet using rolling walker with Sup x 1 and verbal and tactile cues for technique, sequencing, walker use, balance, speed, and safety.    Stairs: 4 w/ bilateral rails and CGA x 1. Pt required verbal and tactile cues for technique, sequencing, hand placement, balance, and safety.  Education:  Educated the patient and/or coach in precautions, home safety, home exercise, use of ice, car and bathroom transfers. Demonstrated good understanding of all.    Assessment: Patient and coach instructed in total joint exercise program.  Required verbal and tactile cues for technique.  Patient progressing toward physical therapy goals.     RN notified of session outcome.     Plan:   Continue plan of care.    Signature: Jacqulyn Cane, PT, DPT

## 2015-12-16 ENCOUNTER — Other Ambulatory Visit: Payer: Self-pay

## 2015-12-16 LAB — BASIC METABOLIC PANEL
Anion Gap: 10 (ref 5.0–15.0)
BUN: 11 mg/dL (ref 7–19)
CO2: 27 mEq/L (ref 22–29)
Calcium: 9.2 mg/dL (ref 8.5–10.5)
Chloride: 105 mEq/L (ref 100–111)
Creatinine: 0.8 mg/dL (ref 0.6–1.0)
Glucose: 100 mg/dL (ref 70–100)
Potassium: 4.2 mEq/L (ref 3.5–5.1)
Sodium: 142 mEq/L (ref 136–145)

## 2015-12-16 LAB — GFR: EGFR: >60

## 2015-12-16 MED ORDER — ASPIRIN 325 MG PO TBEC
325.0000 mg | DELAYED_RELEASE_TABLET | Freq: Two times a day (BID) | ORAL | 0 refills | Status: AC
Start: 2015-12-16 — End: 2015-12-30
  Filled 2015-12-16 (×2): qty 28, 14d supply, fill #0

## 2015-12-16 MED ORDER — OXYCODONE-ACETAMINOPHEN 5-325 MG PO TABS
ORAL_TABLET | ORAL | 0 refills | Status: DC
Start: 2015-12-16 — End: 2016-02-04
  Filled 2015-12-16: qty 100, 20d supply, fill #0

## 2015-12-16 NOTE — Plan of Care (Signed)
Problem: Day 2 Post-op- Knee Surgery  Goal: Hemodynamic Stability  Outcome: Progressing  Goal: Pain at adequate level as identified by patient  Outcome: Progressing  Percocet 2 tabs q 4 hours prn for pain and ice packs to incision effective for pain control.  Goal: Stable Neurovascular Status  Outcome: Progressing  Goal: Free from Infection  Outcome: Progressing  Goal: Patient will maintain normal GI status (Post Op Day 2- Knee Surgery)  Outcome: Progressing  Goal: Mobility/activity is maintained at optimum level for patient  Outcome: Progressing  Goal: Address patient self-management plan (POD 2 KNEE)  Outcome: Progressing  Goal: Patient/Patient Care Companion demonstrates understanding of disease process, treatment plan, medications, and discharge plan (Day 2 Post-op- Knee Surgery)  Outcome: Progressing

## 2015-12-16 NOTE — Progress Notes (Signed)
Home Health Referral          Referral from Amada Jupiter, RN (Case Manager) for home health care upon discharge.    By [employer], the patient has the right to freely choose a home care provider.  Arrangements have been made with:     A company of the patients choosing. We have supplied the patient with a listing of providers in your area who asked to be included and participate in Medicare.   [facility], a home care agency that provides both adult home care services which is a wholly owned and operated by [employer] and participates in [employer]   The preferred provider of your insurance company. Choosing a home care provider other than your insurance company's preferred provider may affect your insurance coverage.    The Home Health Care Referral Form acknowledging the voluntary selection of the home care company has been completed, signed, and is on file.      Home Health Discharge Information     Your doctor has ordered medical equipment    The Medical Equipment Company:  Name: [facility] [employer] Phone #:  [phone]    Equipment Ordered:  Junior rolling walker         The above services were set up by:    Jackey Loge, RN               [facility] Liaison)   Phone     [phone]                                            Signed by: Jackey Loge  Date Time: 12/16/2015 11:50 AM

## 2015-12-16 NOTE — Plan of Care (Signed)
Problem: Moderate/High Fall Risk Score >5  Goal: Patient will remain free of falls  Outcome: Progressing  Fall precautions maintained, no fall or injury

## 2015-12-16 NOTE — Progress Notes (Signed)
DISCHARGE NOTE    Date Time: 12/16/2015 4:41 PM  Patient Name: [name]  Attending Physician: No att. providers found    Iv d/c'd, Rx. given, discharge instructions given, patient verbalized understanding, dressing remains dry and intact, discharged per wheelchair with belongings, pt. condition stable , daughters in attendance.

## 2015-12-16 NOTE — PT Progress Note (Signed)
Physical Therapy Note    [facility]  Physical Therapy Treatment    Patient:  [name] MRN#:  [id number]  Unit:  5NEW ORTHOPEDICS Room/Bed:  V409/W119-14    Assessment:   After today's treatment patient able to ascend and descend steps with SBA and rail/cane adopting sequence and safety cues easily. Good stance phase stability, as well as technique and effort with HEP   Assessment: Decreased LE ROM;Decreased LE strength;Decreased endurance/activity tolerance;Decreased functional mobility;Gait impairment, resulting in continued difficulty with  Functional ROM Strength and endurance  Prognosis: Good;With continued PT status post acute discharge    Interdisciplinary Communication:   Patient up in bedside chair with ice and with alarm activated; call bell within reach. Updated white communication board in room with patient's current mobility status: assist x 1 with RW.  Communicated with nurse regarding availability for treatment    Education:   Further education provided to patient on HEP, stair training, use of ice, car transfers, safety with mobility and ADLs, home safety, falls prevention.  Demonstrated good understanding with all information and cues.  Will benefit from further training to focus on functional ROM Strength and endurance to maximize safety and independence.      Plan:      Plan  Treatment/Interventions: Exercise;Gait training  PT Frequency: 7x/wk    Recommendation  Discharge Recommendation: Home with outpatient PT  PT Frequency: 7x/wk      Recommended mode of transportation at discharge:   car    ----------------------------------------------------------------------------------------------------------------------    Time of treatment:   Start Time: 1020 Stop Time: 1045  Time Calculation (min): 25 min  PT Received On: 12/16/15      Treatment #        Precautions  Weight Bearing Status: no restrictions  Total Knee Replacement: knee immobilizer;OOB (trial of no brace, min stance  instability noted, no LOB)          Patient's medical condition is appropriate for Physical Therapy intervention at this time.   LPTA reviewed physical therapist's evaluation prior to treatment session.    Subjective: Patient is agreeable to participation in the therapy session. Nursing clears patient for therapy.      Pain Assessment  Pain Assessment:  (5/10 knee ice applied)          Objective:  Patient is seated in a bedside chair with dressings in place.        Functional Mobility  Sit to Stand: Modified Independent  Stand to Sit: Modified Independent         Locomotion  Ambulation: Stand by Assist;with front-wheeled walker  Ambulation Distance (Feet): 150 Feet  Pattern: L decreased stance time;L foot decreased clearance;decreased cadence;decreased step length;Step to  Stair Management: one rail R;Stand by Assist;step to pattern;forward;with cane;with gait belt  Number of Stairs: 4    Therapeutic Exercise  Straight Leg Raise: 10  Quad Sets: 10  Heelslides: 10 (antalgic, less painful than yesterday)  Glute Sets: 10  Ankle Pumps: 10  Reviewed TKR HEP including techniques and benefits  Patient adopted tactile and verbal cues well demonstrating good technique.                Signature: Eugenie Norrie Fabian Walder, PTA

## 2015-12-16 NOTE — Final Progress Note (DC Note for stay less than 48 (Signed)
Ortho Note  POD # : 2  Subjective:     Patient ID: [name] is a 60 y.o. female.    Chief Complaint:  S/p left TKA.  Patient continues to do well. No complaints. Pain managed on percocet. Ambulating well with PT.          Objective:     Filed Vitals:    12/16/15 0938   BP: 131/77   Pulse: 97   Temp:    Resp:    SpO2:        Gen: Alert and oriented, cooperative  Extremities:  Dressing changed, incision c/d/i, 2+ pedal pulses, full ROM with foot pumping and toe movement, 1+ edema, No calf tenderness  Neurological:  Sensation intact to light touch    Results     Procedure Component Value Units Date/Time    Basic metabolic panel [[id number]] Collected:  12/16/15 0500    Specimen Information:  Blood Updated:  12/16/15 0624     Glucose 100 mg/dL      BUN 11 mg/dL      Creatinine 0.8 mg/dL      Calcium 9.2 mg/dL      Sodium 542 mEq/L      Potassium 4.2 mEq/L      Chloride 105 mEq/L      CO2 27 mEq/L      Anion Gap 10.0     GFR [[id number]] Collected:  12/16/15 0500     EGFR >60.0 Updated:  12/16/15 3151          Assessment:     Doing well postoperatively.      Plan:     Continue pain management, DVT prophylaxis, and therapy.  D/c home, outpatient PT  FU in 2 weeks with Dr. Selinda Flavin for pain  ASA BID x 2 weeks        Posey Pronto    I agree with the above summary and discharge plan.

## 2015-12-16 NOTE — Discharge Instructions (Signed)
Post-Operative Instructions for Total Knee Replacement  [name], M.D.  [phone]      1.   Elevate the leg (from under the heel and ankle only) and apply an ice pack for 20           minutes every hour as needed to help reduce pain and swelling.    2. Use walker to walk.  You can put as much weight on your leg as you can tolerate. Wean from walker to a cane as tolerated.    3. Remove the brace to walk when you are able to do a straight leg raise on your own.     4. Keep your dressing in place until your follow-up appointment with Dr. Rayvon Char.  You may get it wet in the shower.  No baths/hot tubs/pools for 4 weeks.    5. You may not drive until evaluated by me in the office and I give you clearance.    6. Take one 325mg  Aspirin twice daily for the next 14 days for prevention of blood clots.  Limit use of anti-inflammatories while on this medication (advil, ibuprofen, aleve, etc.)    7. If home therapy has been arranged for you, schedule outpatient therapy to begin after your first postop visit.  I will give you the prescription at your postop visit.    8. Make a postoperative appointment with my office within 10-14 days after surgery by calling [phone].    If you develop severe pain, redness, swelling in the leg, difficulty breathing, or fever greater than 101.2F call my office.    General Guidelines After Your Joint Replacement    Prevention and Recognition of Blood Clots  1. Remember to take your blood thinner medication (ie, Xarelto, aspirin) as directed for as long as your surgeon has prescribed it.  2. Move! Pump your ankles when you're in the bed or chair, and take frequent short walks as tolerated. If your surgeon sent you home with the white TED stockings, wear them throughout the day and especially at night, taking them off only to shower. They are machine washable (delicate cycle).  3. Unrelieved pain in either calf, particularly if accompanied by lower leg swelling, tightness, redness, and  warmth or heat in the area can indicate a possible blood clot. If this occurs, call your surgeon's office or visit the nearest emergency room for evaluation.  4. Chest pain, shortness of breath, fast heart rate and/or a "feeling of impending doom" can signify a blood clot that has traveled to the heart or lungs. Call 911 immediately!    Prevention and Recognition of Infection  1. Follow your surgeon's guidelines for showering and changing the bandage. Make sure whoever changes the bandage thoroughly washes his/her hands, including nailbeds, with soap and water before beginning the procedure.  2. Do not touch or rub the incision or the gauze area of the bandage.  3. Do not soak in a bathtub, swim or otherwise immerse the incision area in water until cleared by your surgeon.  4. Do not apply any ointments, including lotions or even an antibiotic solution such as Bacitracin, until cleared by your surgeon.  5. Keep pets and other possible contaminants away from your incision!  6. Signs of infection include sudden and significantly increased swelling and/or throbbing pain even at rest at the incision site; angry, deep-red color; heat; and/or pus-like discharge. You may or may not have an elevated temperature. If any of these signs occur, call your surgeon's  office. Note that some degree of redness and warmth around the incision are normal after surgery.  7. Extensive bruising is normal, and as you move around more may extend all the way down your leg as it follows gravity.  8. Be proactive about other possible infections. If you think you have an abscessed tooth, urinary tract or other kind of infection, visit your primary care doctor or dentist right away instead of taking a "wait-and-see" approach.  9. You should no longer use the Hibiclens antibacterial soap that you used prior to surgery.    Alleviating Constipation  1. It will most likely be a few days before you have your first bowel movement after surgery, due to  the anesthesia, your relative immobility, and the effects of pain medication. To help counteract, stay hydrated -- prune juice and hot liquids such as coffee or tea can help get the bowels moving. Also take an over-the-counter stool softener and/or gentle laxative as directed by your surgeon.  2. Move! Walking will help get your bowels moving.  3. Some people report relief by placing a warm heating pad over their lower belly while resting.  4. If you are still unable to have a bowel movement, contact your surgeon's office.  5. If you become unable to keep down any food that you eat, are unable to pass gas, and your abdomen becomes increasingly distended or swollen, go to the nearest emergency room.    Pain  1. Stay on top of your pain medication. Once your pain level gets too high, it's hard to play catch up with your pain medications. Definitely take it before your physical therapy appointments! (Do not take narcotics if you are driving yourself to your therapy!)  2. Keep ice on the joint 20 minutes at a time throughout the day. Make sure that you have a layer of clothing or towel between the ice pack and your skin. If you have had a nerve block, the ice may not feel as cold as it actually is.  3. You can make your own ice packs by pouring one or two bottles of clear Karo syrup (from the baking aisle) into a gallon-size heavy duty freezer bag, placing that inside of another freezer bag to prevent leaks, and placing it in the freezer. This will stay cold for a long time and forms a nice, thick heavy gel. You can also use equal parts rubbing alcohol and water.    Please call your surgeon's office with any medical questions or concerns before going to the emergency room, unless a genuine emergency.   For general questions, contact the orthopedic navigator, Lendon Collar ([phone] Monday through Friday, or email [email or web address]).  IN CASE OF EMERGENCY, DIAL 911!    Aspirin Oral tablet  What is this  medicine?  ASPIRIN (AS pir in) is a pain reliever. It is used to treat mild pain and fever. This medicine is also used as directed by a doctor to prevent and to treat heart attacks, to prevent strokes, and to treat arthritis or inflammation.  This medicine may be used for other purposes; ask your health care provider or pharmacist if you have questions.  What should I tell my health care provider before I take this medicine?  They need to know if you have any of these conditions:   anemia   asthma   bleeding problems   child with chickenpox, the flu, or other viral infection   diabetes   gout   if you  frequently drink alcohol containing drinks   kidney disease   liver disease   low level of vitamin K   lupus   smoke tobacco   stomach ulcers or other problems   an unusual or allergic reaction to aspirin, tartrazine dye, other medicines, dyes, or preservatives   pregnant or trying to get pregnant   breast-feeding  How should I use this medicine?  Take this medicine by mouth with a glass of water. Follow the directions on the package or prescription label. You can take this medicine with or without food. If it upsets your stomach, take it with food. Do not take your medicine more often than directed.  Talk to your pediatrician regarding the use of this medicine in children. While this drug may be prescribed for children as young as 74 years of age for selected conditions, precautions do apply. Children and teenagers should not use this medicine to treat chicken pox or flu symptoms unless directed by a doctor.  Patients over 91 years old may have a stronger reaction and need a smaller dose.  Overdosage: If you think you have taken too much of this medicine contact a poison control center or emergency room at once.  NOTE: This medicine is only for you. Do not share this medicine with others.  What if I miss a dose?  If you are taking this medicine on a regular schedule and miss a dose, take it as soon as  you can. If it is almost time for your next dose, take only that dose. Do not take double or extra doses.  What may interact with this medicine?  Do not take this medicine with any of the following medications:   cidofovir   ketorolac   probenecid  This medicine may also interact with the following medications:   alcohol   alendronate   bismuth subsalicylate   flavocoxid   herbal supplements like feverfew, garlic, ginger, ginkgo biloba, horse chestnut   medicines for diabetes or glaucoma like acetazolamide, methazolamide   medicines for gout   medicines that treat or prevent blood clots like enoxaparin, heparin, ticlopidine, warfarin   other aspirin and aspirin-like medicines   NSAIDs, medicines for pain and inflammation, like ibuprofen or naproxen   pemetrexed   sulfinpyrazone   varicella live vaccine  This list may not describe all possible interactions. Give your health care provider a list of all the medicines, herbs, non-prescription drugs, or dietary supplements you use. Also tell them if you smoke, drink alcohol, or use illegal drugs. Some items may interact with your medicine.  What should I watch for while using this medicine?  If you are treating yourself for pain, tell your doctor or health care professional if the pain lasts more than 10 days, if it gets worse, or if there is a new or different kind of pain. Tell your doctor if you see redness or swelling. Also, check with your doctor if you have a fever that lasts for more than 3 days. Only take this medicine to prevent heart attacks or blood clotting if prescribed by your doctor or health care professional.  Do not take aspirin or aspirin-like medicines with this medicine. Too much aspirin can be dangerous. Always read the labels carefully.  This medicine can irritate your stomach or cause bleeding problems. Do not smoke cigarettes or drink alcohol while taking this medicine. Do not lie down for 30 minutes after taking this medicine to  prevent irritation to your throat.  If  you are scheduled for any medical or dental procedure, tell your healthcare provider that you are taking this medicine. You may need to stop taking this medicine before the procedure.  This medicine may be used to treat migraines. If you take migraine medicines for 10 or more days a month, your migraines may get worse. Keep a diary of headache days and medicine use. Contact your healthcare professional if your migraine attacks occur more frequently.  What side effects may I notice from receiving this medicine?  Side effects that you should report to your doctor or health care professional as soon as possible:   allergic reactions like skin rash, itching or hives, swelling of the face, lips, or tongue   breathing problems   changes in hearing, ringing in the ears   confusion   general ill feeling or flu-like symptoms   pain on swallowing   redness, blistering, peeling or loosening of the skin, including inside the mouth or nose   signs and symptoms of bleeding such as bloody or black, tarry stools; red or dark-brown urine; spitting up blood or brown material that looks like coffee grounds; red spots on the skin; unusual bruising or bleeding from the eye, gums, or nose   trouble passing urine or change in the amount of urine   unusually weak or tired   yellowing of the eyes or skin  Side effects that usually do not require medical attention (report to your doctor or health care professional if they continue or are bothersome):   diarrhea or constipation   headache   nausea, vomiting   stomach gas, heartburn  This list may not describe all possible side effects. Call your doctor for medical advice about side effects. You may report side effects to FDA at 1-800-FDA-1088.  Where should I keep my medicine?  Keep out of the reach of children.  Store at room temperature between 15 and 30 degrees C (59 and 86 degrees F). Protect from heat and moisture. Do not use this  medicine if it has a strong vinegar smell. Throw away any unused medicine after the expiration date.  NOTE:This sheet is a summary. It may not cover all possible information. If you have questions about this medicine, talk to your doctor, pharmacist, or health care provider. Copyright 2015 Gold Standard    Oxycodone Hydrochloride, Acetaminophen Oral tablet  What is this medicine?  ACETAMINOPHEN; OXYCODONE (a set a MEE noe fen; ox i KOE done) is a pain reliever. It is used to treat mild to moderate pain.  This medicine may be used for other purposes; ask your health care provider or pharmacist if you have questions.  What should I tell my health care provider before I take this medicine?  They need to know if you have any of these conditions:   brain tumor   Crohn's disease, inflammatory bowel disease, or ulcerative colitis   drug abuse or addiction   head injury   heart or circulation problems   if you often drink alcohol   kidney disease or problems going to the bathroom   liver disease   lung disease, asthma, or breathing problems   an unusual or allergic reaction to acetaminophen, oxycodone, other opioid analgesics, other medicines, foods, dyes, or preservatives   pregnant or trying to get pregnant   breast-feeding  How should I use this medicine?  Take this medicine by mouth with a full glass of water. Follow the directions on the prescription label. Take your medicine  at regular intervals. Do not take your medicine more often than directed.  Talk to your pediatrician regarding the use of this medicine in children. Special care may be needed.  Patients over 25 years old may have a stronger reaction and need a smaller dose.  Overdosage: If you think you have taken too much of this medicine contact a poison control center or emergency room at once.  NOTE: This medicine is only for you. Do not share this medicine with others.  What if I miss a dose?  If you miss a dose, take it as soon as you can. If  it is almost time for your next dose, take only that dose. Do not take double or extra doses.  What may interact with this medicine?   alcohol   antihistamines   barbiturates like amobarbital, butalbital, butabarbital, methohexital, pentobarbital, phenobarbital, thiopental, and secobarbital   benztropine   drugs for bladder problems like solifenacin, trospium, oxybutynin, tolterodine, hyoscyamine, and methscopolamine   drugs for breathing problems like ipratropium and tiotropium   drugs for certain stomach or intestine problems like propantheline, homatropine methylbromide, glycopyrrolate, atropine, belladonna, and dicyclomine   general anesthetics like etomidate, ketamine, nitrous oxide, propofol, desflurane, enflurane, halothane, isoflurane, and sevoflurane   medicines for depression, anxiety, or psychotic disturbances   medicines for sleep   muscle relaxants   naltrexone   narcotic medicines (opiates) for pain   phenothiazines like perphenazine, thioridazine, chlorpromazine, mesoridazine, fluphenazine, prochlorperazine, promazine, and trifluoperazine   scopolamine   tramadol   trihexyphenidyl  This list may not describe all possible interactions. Give your health care provider a list of all the medicines, herbs, non-prescription drugs, or dietary supplements you use. Also tell them if you smoke, drink alcohol, or use illegal drugs. Some items may interact with your medicine.  What should I watch for while using this medicine?  Tell your doctor or health care professional if your pain does not go away, if it gets worse, or if you have new or a different type of pain. You may develop tolerance to the medicine. Tolerance means that you will need a higher dose of the medication for pain relief. Tolerance is normal and is expected if you take this medicine for a long time.  Do not suddenly stop taking your medicine because you may develop a severe reaction. Your body becomes used to the medicine. This  does NOT mean you are addicted. Addiction is a behavior related to getting and using a drug for a non-medical reason. If you have pain, you have a medical reason to take pain medicine. Your doctor will tell you how much medicine to take. If your doctor wants you to stop the medicine, the dose will be slowly lowered over time to avoid any side effects.  You may get drowsy or dizzy. Do not drive, use machinery, or do anything that needs mental alertness until you know how this medicine affects you. Do not stand or sit up quickly, especially if you are an older patient. This reduces the risk of dizzy or fainting spells. Alcohol may interfere with the effect of this medicine. Avoid alcoholic drinks.  There are different types of narcotic medicines (opiates) for pain. If you take more than one type at the same time, you may have more side effects. Give your health care provider a list of all medicines you use. Your doctor will tell you how much medicine to take. Do not take more medicine than directed. Call emergency for help if  you have problems breathing.  The medicine will cause constipation. Try to have a bowel movement at least every 2 to 3 days. If you do not have a bowel movement for 3 days, call your doctor or health care professional.  Do not take Tylenol (acetaminophen) or medicines that have acetaminophen with this medicine. Too much acetaminophen can be very dangerous. Many nonprescription medicines contain acetaminophen. Always read the labels carefully to avoid taking more acetaminophen.  What side effects may I notice from receiving this medicine?  Side effects that you should report to your doctor or health care professional as soon as possible:   allergic reactions like skin rash, itching or hives, swelling of the face, lips, or tongue   breathing difficulties, wheezing   confusion   light headedness or fainting spells   severe stomach pain   unusually weak or tired   yellowing of the skin or the  whites of the eyes  Side effects that usually do not require medical attention (report to your doctor or health care professional if they continue or are bothersome):   dizziness   drowsiness   nausea   vomiting  This list may not describe all possible side effects. Call your doctor for medical advice about side effects. You may report side effects to FDA at 1-800-FDA-1088.  Where should I keep my medicine?  Keep out of the reach of children. This medicine can be abused. Keep your medicine in a safe place to protect it from theft. Do not share this medicine with anyone. Selling or giving away this medicine is dangerous and against the law.  Store at room temperature between 20 and 25 degrees C (68 and 77 degrees F). Keep container tightly closed. Protect from light.  This medicine may cause accidental overdose and death if it is taken by other adults, children, or pets. Flush any unused medicine down the toilet to reduce the chance of harm. Do not use the medicine after the expiration date.  NOTE: This sheet is a summary. It may not cover all possible information. If you have questions about this medicine, talk to your doctor, pharmacist, or health care provider.  NOTE:This sheet is a summary. It may not cover all possible information. If you have questions about this medicine, talk to your doctor, pharmacist, or health care provider. Copyright 2015 Gold Standard

## 2015-12-17 ENCOUNTER — Telehealth (INDEPENDENT_AMBULATORY_CARE_PROVIDER_SITE_OTHER): Payer: Self-pay

## 2015-12-17 NOTE — Telephone Encounter (Signed)
Post Acute Discharge Call    Type of Encounter (ER, InPt or Obs) :  InPt    Facility: [facility]    Discharge Date: 12/16/15    Primary Discharge Dx: Left Knee DJD    Prior ER Visits/Hospitalizations (past yr): None    TCM Eligible: Yes    Follow Up Appt with PCP/Specialist :  Pt will call back to schedule with PCP. F/U on 12/27/15 with Dr. Rayvon Char for postop.      Outside care ordered:  Outpatient PT ordered. Pt will be going to [facility] starting 12/20/15    Patient Questions:    What symptoms made you go to the ER or hospital?:  Pt presented to the hospital for elective surgery. Pt underwent a Total left knee replacement with no complications.     How do you feel today after your recent hospital/ER visit?:  Pt states she is feeling good. Pt denies any n/v, or drainage from the incision. Pt rates her pain at 5/10 and states she took her Percocet at 830 this morning. Pt states she is currently taking her Percocet Q4 hours and states the pain is "not as bad as I thought it was going to be". Pt states she is walking with her walker and doing the exercises she was given at D/C.     Do you have any new or worsening symptoms since being seen?    Pt denies any new or worsening symptoms at the time of the call.       Do you have your discharge instructions?  Any questions about them?  Pt states she received her d/c instructions and has no questions on them at the time of the call.     Do you have questions about any new medications or changes to your medications from the visit?  D/C meds reviewed with pt. Pt is taking the ASA and Percocet as ordered and has d/c the tramadol, Norco and diclofenac as directed. Pt has no questions on meds at the time of the call.    Do you have assistance at home and transportation to appointments?  Yes, pt states both of her daughters are currently staying with her, and will have a friend staying with her after they go home.       Advised to follow up as instructed.  Patient / family member  verbalizes understanding and has no other questions or concerns.   Instructed to call back if symptoms change or worsen and/or go to the emergency room or call 911 for emergency symptoms.

## 2015-12-20 ENCOUNTER — Inpatient Hospital Stay: Payer: No Typology Code available for payment source | Attending: Orthopaedic Surgery

## 2015-12-20 VITALS — BP 145/95 | HR 105

## 2015-12-20 DIAGNOSIS — Z471 Aftercare following joint replacement surgery: Secondary | ICD-10-CM | POA: Insufficient documentation

## 2015-12-20 DIAGNOSIS — Z96652 Presence of left artificial knee joint: Secondary | ICD-10-CM | POA: Insufficient documentation

## 2015-12-20 DIAGNOSIS — R262 Difficulty in walking, not elsewhere classified: Secondary | ICD-10-CM | POA: Insufficient documentation

## 2015-12-20 DIAGNOSIS — M25562 Pain in left knee: Secondary | ICD-10-CM | POA: Insufficient documentation

## 2015-12-20 NOTE — PT/OT Plan of Care (Signed)
Plan of Care IPTC Medicare Provider #: [phone]                Patient Name: [name]  MRN: [id number]  DOI: Onset of Problem / Injury: 12/14/15 DOS: Date of Surgery: 12/14/15 SOC:12/20/2015    Diagnosis:     ICD-10-CM    1. Acute pain of left knee M25.562    2. Difficulty in walking involving joint R26.2        ASSESSMENT: the patient is a 60 y.o. female presenting s/p L TKA who requires Physical Therapy for the following:  Impairments: decreased knee ROM, decreased hip and knee strength, decreased muscle extensibility, impaired balance, postural deficits, abnormal gait kinematics, and increased pain     Barriers to Rehabilitations/Comorbidities/personal  factors:  Comorbidities HBP and anxiety    Pain located: L knee    Clinical presentation: stable     Functional Limitations (PLOF): Difficulty and pain with all walking, standing, stair negotiation, getting in/out of car/bed, transferring sit to from standing, and sleeping. Currently negotiating stairs with step to pattern to get into home and onto second level for bedroom. Currently ambulating with axillary crutches, prior to surgery ambulating without assistive device.     Plan Of Care: Body Mechanics Education, NMR, Proprioceptive Activites, Instruction in HEP, Therapeutic Exercise, Balance/Gait training and Soft Tissue/Joint Mobilization: STM to knee, thigh and calf musculature as needed    Frequency/Duration: 2 times a week for 18 sessions. Certification Status Ends:03/18/16    Goals:  Date (Body Area, Impairment Goal, Functional   Activity, Target Performance) Time Frame Status Date/  Initial   12/20/2015   Increase knee flexion AROM to 110 degrees to allow patient to safely negotiate stairs reciprocally with railing.  18 sessions Initial Eval    12/20/2015   Increase knee extension AROM to 0 degrees to allow sleeping with 0 interruptions due to pain.  18 sessions Initial Eval    12/20/2015   Increase quadriceps strength 4+ for safe ascending  stairs/descending stairs reciprocally.  18 sessions Initial Eval    12/20/2015   Improve Lower Extremity Functional Score (LEFS) to 45% to exceed Minimal Detectable Change (MDC) of 9 points.     18 sessions Initial Eval    12/20/2015   Patient will demonstrate independence in prescribed HEP with proper form, sets and reps for safe discharge to an independent program.  18 sessions Initial Eval      Signature: Laural Benes, PT, DPT  [place] 1913 Date: 12/20/2015    Signature: Cynda Familia, MD _______________________  Date:  ________________     Patient Name: [name]  MRN: [id number]

## 2015-12-20 NOTE — PT/OT Therapy Note (Addendum)
INITIAL EVALUATION (Knee)    Name: [name] Age: 60 y.o. Occupation: Quarry manager (off x 1 month) SOC:12/20/2015  Referring Physician: Cynda Familia, MD MD recheck: 12/27/15 DOS: Date of Surgery: 12/14/15 DOI: Onset of Problem / Injury: 12/14/15  # of Authorized Visits: 16 Visit # 1    Diagnosis (Treating/Medical):     ICD-10-CM    1. Acute pain of left knee M25.562    2. Difficulty in walking involving joint R26.2         SUBJECTIVE:    Mechanism of Injury: S/P L TKA on 12/14/15.     Patient's reason for seeking PT /Functional Limitations (PLOF): See POC    Past Medical History:   Past Medical History   Diagnosis Date   . Neck pain      related to lower back but significant at this time    . Hypertensive disorder      stable meds   . 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    . Arthritis      bilateral knees   . Difficulty walking      limps   . Lower back pain      sciatica radiates down LLE: Pain range= 0-10   . Neuropathy      numbness upon awakening from low back radiating down LLE .Marland Kitchen. diminishes after awhile after moving around   . Eczema      has prescription cream   . Carpal tunnel syndrome on both sides      stiffness noted    . Abnormal vision      glasses   . Anxiety      uses xanax @ bedtime     Medications: .  ALPRAZolam (XANAX) 0.5 MG tablet  .  aspirin EC 325 MG EC tablet  .  betamethasone dipropionate (DIPROLENE) 0.05 % cream  .  losartan-hydrochlorothiazide (HYZAAR) 100-12.5 MG per tablet  .  oxyCODONE-acetaminophen (PERCOCET) 5-325 MG per tablet   Fall Risks: Medium, Impaired balance/gait, Impaired mobility, Orthopedic patient    Other Treatment/Prior Therapy: Yes, last January for knee  Prior Hospitalization: No  Hand Dominance: Dominant Hand: Right Involved Side: Involved Side: Left   DiagnosticTests: X-rays pre and post op    Outcome Measure:               LEFS L Score: 18                          % Pain Score: 63% Rate Satisfaction with Current Function: 7/10   Living  Environment: Type of Residence: Multi-story home      Dwelling Entrance: # Steps to Enter: 7   Patient lives with: Living Arrangements: Family    OBJECTIVE:    Vitals: BP: (!) 145/95 mmHg Heart Rate: (!) 105      Observation/Posture/Gait/Integumentary:  Observation of posture: Deficits noted: Forward Head and Rounded Shoulders  Ambulation: with Crutches  Gait: decreased stance phase left, decreased heel strike left, decreased hip extension left, decreased knee flexion with swing phase left, decreased toe off left and decreased TKE left  Functional Strength:   Sit to Stand: UE Assist  Squat:  Unable  Step-up: NT  Heel Raises:  Bilateral: NT    Balance:  SLS R: Eyes Open (EO): NT  SLS L: Eyes Open (EO): NT    Range of Motion: (degrees)  Initial Right  AROM Knee InitialLeft AROM  InitialLeft PROM   Left AROM   Left PROM   114 Flexion 45 63     -9 Extension -8      (blank fields were intentionally left blank)    Hip AROM: WFL  Ankle AROM: Limitations Decreased ankle DF B    Initial   R   R LE Strength  MMT /5 Initial  L    L   4  Hip Flexion NT      Hip Extension       Hip Abduction       Hip Adduction     4+  Quadriceps     4+  Hamstrings       Ankle Dorsiflexion       Ankle Plantarflexion     (blank fields were intentionally left blank)    Girth/Edema: Other: Swelling in L LE knee to ankle    Initial R  Initial L   Suprapatellar      Mid Patellar      Infrapatellar      10 cm. Prox.      10 cm. Dist.      (blank fields were intentionally left blank)    Integumentary: Other: surgical incisions covered by bandage  Palpation: Pain to palpation: Tenderness around knee   Patellar Mobility: NT  Direction: NT    Flexibility:    Comment:   Hamstrings NT    Quadriceps NT    Piriformis NT    ITBand NT    Iliopsoas NT    Gastroc NT        Special Tests/Neurological Screen:     R L  R L   Lachmans NT NT Apley's Test NT NT   Anterior Drawer NT NT Thomas Test NT NT   Posterior Drawer NT NT Obers Test NT NT   Valgus Stress NT NT SLR  NT NT   Varus Stress NT NT  NT NT   McMurray's Test NT NT          Deep Tendon Reflex R L   L3-L4 Patella NT NT   S1 Achilles NT NT    NT NT     Sensation to Light touch: Intact    Treatment Initial Visit:  Evaluation   Patient Education on the role of PT, goals of PT, use of ice and HEP.   Therapeutic exercise with instruction in HEP and provided patient written and illustrated handout Yes   Exercises included: Heel slides, quad sets, and ankle pumps  Therapeutic Activity: Educated patient on importance of LE elevation above the level of the heart to help reduce swelling, especially as walking increases. Also advised on doing ankle pumps with LE elevated.   Manual: N/A  Modalities: Vasopneumatic Medium 15 min. Location L knee Position Supine and Elevated L LE  Rehab Potential:good  Is patient aware of diagnosis: Yes  For Next Visit Add     Laural Benes, PT, DPT  [place] 0981  12/20/2015    Total Treatment (billable) Time:  2'  Total Timed Minutes:  20'

## 2015-12-22 ENCOUNTER — Inpatient Hospital Stay: Payer: No Typology Code available for payment source | Attending: Orthopaedic Surgery

## 2015-12-22 DIAGNOSIS — M25562 Pain in left knee: Secondary | ICD-10-CM | POA: Insufficient documentation

## 2015-12-22 DIAGNOSIS — R262 Difficulty in walking, not elsewhere classified: Secondary | ICD-10-CM | POA: Insufficient documentation

## 2015-12-22 DIAGNOSIS — Z471 Aftercare following joint replacement surgery: Secondary | ICD-10-CM | POA: Insufficient documentation

## 2015-12-22 DIAGNOSIS — Z96652 Presence of left artificial knee joint: Secondary | ICD-10-CM | POA: Insufficient documentation

## 2015-12-22 NOTE — PT/OT Therapy Note (Signed)
DAILY NOTE   12/22/2015      Pt 15 min. late  Total Treatment (billable)Time: 845- 930   Total Timed Minutes:  45 min Visit Number:  2      Payor: [employer] / Plan: [employer] INS[employer]ED / Product Type: *No Product type* /    # of Authorized Visits: 16 Visit #: 1      Diagnosis (Treating/Medical):     ICD-10-CM    1. Acute pain of left knee M25.562    2. Difficulty in walking involving joint R26.2            Subjective:  Tallula's pain is Constant/continuous and Increases with movement and is rated a 6/10.  Functional Status: using 2 crutches to ambulate    Objective:   Treatment:  Therapeutic Exercise: to improve: Flexibility/ROM, Stabilization and Strength   Warm-up:  No warm up today  Modifications/Patient Education:  Verbal, Visual and Tactile cues perform exercises properly       Precautions:   Date of Surgery:  12-14-15  MD Follow-up: 12-27-15          Exercise Flow Sheet    Exercise Specifics 12/22/2015             Quad sets    5"x10 CG             Heel slides    On yellow ball x10 CG             SLR    x10 with assist by PTA CG             Hip abd    x10 in SL CG             SAQ/ LAQ    x10 each CG             Ankle pumps   On yellow ball in SAQ position x10 CG                                                                                                  Home Exercise Program                 (Initials = supervised exercise by clinician)        NMR:  na    Therapeutic Activities:  na    Manual Therapy:   STM to medial/ lateral  Gentle patella mobs  Passive ROM for knee flex and ext        Initial Evaluation Reference and/orCurrent Measurements (ROM, Strength, Girth, Outcomes, etc.):   Knee flexion seated dangle 78    Modalities: Vasopneumatic Medium 15 min. Location (L) knee Position Supine 90/90  Therapy Rationale: Decrease Pain, Decrease Inflammation and Decrease Edema       Assessment (response to treatment):   Pateint very caution about moving knee. Pt needs max encouragement and assistance to get knee bending  and straightening.    Progress towards functional goals: not much at this time only 2nd visit    Patient requires continued skilled care to: decrease pain and edema and increase  strength per protocol    Plan:  Continue with Plan of Care MD follow 12-27-15    Nestor Ramp  License #[id number]    12/22/2015

## 2015-12-27 ENCOUNTER — Inpatient Hospital Stay: Payer: No Typology Code available for payment source

## 2015-12-28 ENCOUNTER — Inpatient Hospital Stay: Payer: No Typology Code available for payment source

## 2015-12-30 ENCOUNTER — Inpatient Hospital Stay: Payer: No Typology Code available for payment source | Attending: Orthopaedic Surgery

## 2015-12-30 DIAGNOSIS — R262 Difficulty in walking, not elsewhere classified: Secondary | ICD-10-CM

## 2015-12-30 DIAGNOSIS — M25562 Pain in left knee: Secondary | ICD-10-CM | POA: Insufficient documentation

## 2015-12-30 DIAGNOSIS — Z471 Aftercare following joint replacement surgery: Secondary | ICD-10-CM | POA: Insufficient documentation

## 2015-12-30 DIAGNOSIS — Z96652 Presence of left artificial knee joint: Secondary | ICD-10-CM | POA: Insufficient documentation

## 2015-12-30 NOTE — PT/OT Therapy Note (Signed)
DAILY NOTE   12/30/2015        Total Treatment (billable)Time: 12:50-1:35   Total Timed Minutes:  45 min Visit Number:  3    Patient arrived 20 minutes late.     Payor: [employer] / Plan: [employer] INS[employer]ED / Product Type: *No Product type* /    # of Authorized Visits: 16 Visit #: 3    Diagnosis (Treating/Medical):     ICD-10-CM    1. Acute pain of left knee M25.562    2. Difficulty in walking involving joint R26.2          Subjective:  Nohea reports went to MD earlier this week who was happy with knee and took stitches out. Reports knee feels 'tight' while walking, but not too painful  Functional Status: using SPC to ambulate, but walking with limp    Objective:   Treatment:  Therapeutic Exercise: to improve: Flexibility/ROM, Stabilization and Strength   Modifications/Patient Education:  Verbal, Visual and Tactile cues perform exercises properly  Educated patient in how to perform seated knee extension hang and seated knee flexion with self overpressure for HEP.         Exercise Flow Sheet    Exercise 12/22/2015 12/30/15            Quad sets   5"x10 CG 5 sec hold x 10  AS             Heel slides   On yellow ball x10 CG X 15  AS            SLR   x10 with assist by PTA CG Deferred today             Hip abd   x10 in SL CG X 10  AS            SAQ/ LAQ   x10 each CG X 10 ea  AS            Ankle pumps   x10 CG X 10   AS                Supine calf stretch with green strap 30 sec hold x 3  AS                 Bike rocking x 4'  AS                                                            Home Exercise Program                (Initials = supervised exercise by clinician)    NMR:  na    Therapeutic Activities:  na    Manual Therapy:   Gentle patella mobs grades 2-3 inferior and superior; Seated overpressure into knee flexion while sitting EOB        Initial Evaluation Reference and/orCurrent Measurements (ROM, Strength, Girth, Outcomes, etc.):   Range of Motion: (degrees)  Initial Right   AROM  Knee  InitialLeft AROM   InitialLeft PROM  12/30/15  Left AROM    Left PROM    114  Flexion  45  63        -9  Extension  -8    -7      (blank  fields were intentionally left blank)      Modalities: Vasopneumatic Medium 10 min. Location (L) knee Position Supine  Therapy Rationale: Decrease Pain, Decrease Inflammation and Decrease Edema       Assessment (response to treatment):   Tolerated bike rocking well, but not able to go all the way around yet. Educated in how to work on knee extension and flexion at home and emphasized importance of doing regularly throughout the day.     Progress towards functional goals:   Date  (Body Area, Impairment Goal, Functional     Activity, Target Performance)  Time Frame  Status  Date/   Initial    12/20/2015   Increase knee flexion AROM to 110 degrees to allow patient to safely negotiate stairs reciprocally with railing.   18 sessions  Initial Eval      12/20/2015    Increase knee extension AROM to 0 degrees to allow sleeping with 0 interruptions due to pain.   18 sessions  Initial Eval      12/20/2015    Increase quadriceps strength 4+ for safe ascending stairs/descending stairs reciprocally.   18 sessions  Initial Eval      12/20/2015    Improve Lower Extremity Functional Score (LEFS) to 45% to exceed Minimal Detectable Change (MDC) of 9 points.      18 sessions  Initial Eval      12/20/2015    Patient will demonstrate independence in prescribed HEP with proper form, sets and reps for safe discharge to an independent program.   18 sessions  Initial Eval        Patient requires continued skilled care to: decrease pain and edema and increase strength per protocol    Plan:  Continue with Plan of Care;    Laural Benes, PT, DPT  [place] [phone]   12/30/2015

## 2016-01-03 ENCOUNTER — Inpatient Hospital Stay: Payer: No Typology Code available for payment source | Attending: Orthopaedic Surgery

## 2016-01-03 DIAGNOSIS — Z471 Aftercare following joint replacement surgery: Secondary | ICD-10-CM | POA: Insufficient documentation

## 2016-01-03 DIAGNOSIS — M25562 Pain in left knee: Secondary | ICD-10-CM

## 2016-01-03 DIAGNOSIS — Z96652 Presence of left artificial knee joint: Secondary | ICD-10-CM | POA: Insufficient documentation

## 2016-01-03 DIAGNOSIS — R262 Difficulty in walking, not elsewhere classified: Secondary | ICD-10-CM | POA: Insufficient documentation

## 2016-01-03 NOTE — PT/OT Therapy Note (Signed)
DAILY NOTE   01/03/2016        Total Treatment (billable)Time: 9:15-10:10   Total Timed Minutes:  55 min Visit Number:  4    Payor: [employer] / Plan: [employer] INS[employer]ED / Product Type: *No Product type* /    # of Authorized Visits: 16 Visit #: 4    Diagnosis (Treating/Medical):     ICD-10-CM    1. Acute pain of left knee M25.562    2. Difficulty in walking involving joint R26.2          Subjective:  [name] reports knee is tight, has been working on exercises at home  Functional Status: ambulated into clinic today without cane because she forgot it at home.     Objective:   Treatment:  Therapeutic Exercise: to improve: Flexibility/ROM, Stabilization and Strength   Modifications/Patient Education:  Verbal, Visual and Tactile cues perform exercises properly  Educated patient in how to perform seated knee extension hang and seated knee flexion with self overpressure for HEP.         Exercise Flow Sheet    Exercise 12/22/2015 12/30/15 01/03/16           Quad sets   5"x10 CG 5 sec hold x 10  AS  W/ towel roll 5 sec hold x 15  AS            Heel slides   On yellow ball x10 CG X 15  AS X 15  AS           SLR   x10 with assist by PTA CG Deferred today  X 10 with PT assist   AS           Hip abd   x10 in SL CG X 10  AS X 10  AS           SAQ/ LAQ   x10 each CG X 10 ea  AS X 15 ea  AS           Ankle pumps   x10 CG X 10   AS Deferred today                Supine calf stretch with green strap 30 sec hold x 3  AS  Standing 30 sec hold x 3  AS                Bike rocking x 4'  AS  5'  AS                                                           Home Exercise Program                (Initials = supervised exercise by clinician)    NMR:  na    Therapeutic Activities:  Na    Gait Training: Ambulated in clinic forward and backwards along handrail with cueing for heel/toe pattern and equal weight shift. Cued for looking forward and not down at feet. Then transitioned to ambulating in clinic without hand support with cueing to decrease  speed and focus on quality of gait.     Manual Therapy:   Gentle patella mobs grades 2-3 inferior and superior; Seated overpressure into knee flexion while sitting EOB; manual over pressure into extension in  supine        Initial Evaluation Reference and/orCurrent Measurements (ROM, Strength, Girth, Outcomes, etc.):   Range of Motion: (degrees)  Initial Right   AROM  Knee  InitialLeft AROM  InitialLeft PROM  12/30/15  Left AROM  01/03/16  Left PROM    114  Flexion  45  63    94    -9  Extension  -8    -7      (blank fields were intentionally left blank)    Modalities: Vasopneumatic Medium 10 min. Location (L) knee Position Supine  Therapy Rationale: Decrease Pain, Decrease Inflammation and Decrease Edema     Assessment (response to treatment):   Demonstrated improved knee flexion PROM today compared to previous visits.     Progress towards functional goals:   Date  (Body Area, Impairment Goal, Functional     Activity, Target Performance)  Time Frame  Status  Date/   Initial    12/20/2015   Increase knee flexion AROM to 110 degrees to allow patient to safely negotiate stairs reciprocally with railing.   18 sessions  progression  01/03/16 AS   12/20/2015    Increase knee extension AROM to 0 degrees to allow sleeping with 0 interruptions due to pain.   18 sessions  Initial Eval      12/20/2015    Increase quadriceps strength 4+ for safe ascending stairs/descending stairs reciprocally.   18 sessions  Initial Eval      12/20/2015    Improve Lower Extremity Functional Score (LEFS) to 45% to exceed Minimal Detectable Change (MDC) of 9 points.      18 sessions  Initial Eval      12/20/2015    Patient will demonstrate independence in prescribed HEP with proper form, sets and reps for safe discharge to an independent program.   18 sessions  Initial Eval        Patient requires continued skilled care to: decrease pain and edema and increase strength per protocol    Plan:  Continue with Plan of Care;    Laural Benes, PT, DPT  [place]  [phone]   01/03/2016

## 2016-01-05 ENCOUNTER — Inpatient Hospital Stay: Payer: No Typology Code available for payment source | Attending: Orthopaedic Surgery

## 2016-01-05 DIAGNOSIS — R262 Difficulty in walking, not elsewhere classified: Secondary | ICD-10-CM | POA: Insufficient documentation

## 2016-01-05 DIAGNOSIS — M25562 Pain in left knee: Secondary | ICD-10-CM | POA: Insufficient documentation

## 2016-01-05 NOTE — PT/OT Therapy Note (Signed)
DAILY NOTE   01/05/2016        Total Treatment (billable)Time: 2:25-3:15   Total Timed Minutes:  40 min Visit Number:  5    Patient arrived 10 minutes late today.     Payor: [employer] / Plan: [employer] INSURED / Product Type: *No Product type* /    # of Authorized Visits: 16 Visit #: 5    Diagnosis (Treating/Medical):     ICD-10-CM    1. Acute pain of left knee M25.562    2. Difficulty in walking involving joint R26.2          Subjective:  [name] reports knee is tight and very sore today, but isn't sure if its due to the weather.   Functional Status: Consistent with HEP; ambulating with SPC    Objective:   Treatment:  Therapeutic Exercise: to improve: Flexibility/ROM, Stabilization and Strength   Modifications/Patient Education:  Verbal, Visual and Tactile cues perform exercises properly  Educated patient in how to perform seated knee extension hang and seated knee flexion with self overpressure for HEP.         Exercise Flow Sheet    Exercise 12/22/2015 12/30/15 01/03/16 01/05/16          Quad sets   5"x10 CG 5 sec hold x 10  AS  W/ towel roll 5 sec hold x 15  AS  5 sec hold x 15  AS           Heel slides   On yellow ball x10 CG X 15  AS X 15  AS X 15  AS          SLR   x10 with assist by PTA CG Deferred today  X 10 with PT assist   AS S/L hip flex/ext x 10  AS          Hip abd   x10 in SL CG X 10  AS X 10  AS Hip abd leg raise x 8 and clam x 10  AS          SAQ/ LAQ   x10 each CG X 10 ea  AS X 15 ea  AS X 15 ea  AS          Ankle pumps   x10 CG X 10   AS Deferred today  Rocker board A/P x 15  AS              Supine calf stretch with green strap 30 sec hold x 3  AS  Standing 30 sec hold x 3  AS  30 sec hold x 3  AS               Bike rocking x 4'  AS  5'  AS 4'  AS                                                          Home Exercise Program                (Initials = supervised exercise by clinician)    NMR:  na    Therapeutic Activities:  Na    Gait Training: Ambulated in clinic forward and backwards with SPC and  cueing for heel/toe pattern and equal weight shift. Multiple laps through  clinic, with decreasing cueing necessary throughout.     Manual Therapy:   Gentle patella mobs grades 2-3 inferior and superior; Seated overpressure into knee flexion while sitting EOB; manual over pressure into extension in supine        Initial Evaluation Reference and/orCurrent Measurements (ROM, Strength, Girth, Outcomes, etc.):   None today    Modalities: Ice Pack 10 min. Location (L) knee Position Supine  Therapy Rationale: Decrease Pain, Decrease Inflammation and Decrease Edema     Assessment (response to treatment):   Tolerated new exercises well, hip abd very weak. Gave clam for HEP, but deferred S/L leg raise at this time due to cueing required to prevent hip ER and flexion.      Progress towards functional goals:   Date  (Body Area, Impairment Goal, Functional     Activity, Target Performance)  Time Frame  Status  Date/   Initial    12/20/2015   Increase knee flexion AROM to 110 degrees to allow patient to safely negotiate stairs reciprocally with railing.   18 sessions  progression  01/03/16 AS   12/20/2015    Increase knee extension AROM to 0 degrees to allow sleeping with 0 interruptions due to pain.   18 sessions  Initial Eval      12/20/2015    Increase quadriceps strength 4+ for safe ascending stairs/descending stairs reciprocally.   18 sessions  Initial Eval      12/20/2015    Improve Lower Extremity Functional Score (LEFS) to 45% to exceed Minimal Detectable Change (MDC) of 9 points.      18 sessions  Initial Eval      12/20/2015    Patient will demonstrate independence in prescribed HEP with proper form, sets and reps for safe discharge to an independent program.   18 sessions  Initial Eval        Patient requires continued skilled care to: decrease pain and edema and increase strength per protocol    Plan:  Continue with Plan of Care;    Laural Benes, PT, DPT  [place] [phone]   01/05/2016

## 2016-01-10 ENCOUNTER — Inpatient Hospital Stay: Payer: No Typology Code available for payment source

## 2016-01-12 ENCOUNTER — Inpatient Hospital Stay: Payer: No Typology Code available for payment source

## 2016-01-18 ENCOUNTER — Inpatient Hospital Stay: Payer: No Typology Code available for payment source

## 2016-01-20 ENCOUNTER — Inpatient Hospital Stay: Payer: No Typology Code available for payment source

## 2016-01-24 ENCOUNTER — Inpatient Hospital Stay: Payer: No Typology Code available for payment source

## 2016-01-27 ENCOUNTER — Inpatient Hospital Stay: Payer: No Typology Code available for payment source

## 2016-01-28 ENCOUNTER — Ambulatory Visit (INDEPENDENT_AMBULATORY_CARE_PROVIDER_SITE_OTHER): Payer: No Typology Code available for payment source | Admitting: Internal Medicine

## 2016-01-31 ENCOUNTER — Ambulatory Visit (INDEPENDENT_AMBULATORY_CARE_PROVIDER_SITE_OTHER): Payer: No Typology Code available for payment source | Admitting: Internal Medicine

## 2016-01-31 ENCOUNTER — Inpatient Hospital Stay: Payer: No Typology Code available for payment source

## 2016-02-03 ENCOUNTER — Inpatient Hospital Stay: Payer: No Typology Code available for payment source

## 2016-02-04 ENCOUNTER — Encounter (INDEPENDENT_AMBULATORY_CARE_PROVIDER_SITE_OTHER): Payer: Self-pay | Admitting: Internal Medicine

## 2016-02-04 ENCOUNTER — Ambulatory Visit (INDEPENDENT_AMBULATORY_CARE_PROVIDER_SITE_OTHER): Payer: No Typology Code available for payment source | Admitting: Internal Medicine

## 2016-02-04 VITALS — BP 106/76 | HR 102 | Temp 98.7°F | Wt 185.8 lb

## 2016-02-04 DIAGNOSIS — M170 Bilateral primary osteoarthritis of knee: Secondary | ICD-10-CM

## 2016-02-04 DIAGNOSIS — I10 Essential (primary) hypertension: Secondary | ICD-10-CM

## 2016-02-04 DIAGNOSIS — M545 Low back pain: Secondary | ICD-10-CM

## 2016-02-04 DIAGNOSIS — G8929 Other chronic pain: Secondary | ICD-10-CM

## 2016-02-04 DIAGNOSIS — F419 Anxiety disorder, unspecified: Secondary | ICD-10-CM

## 2016-02-04 DIAGNOSIS — F1190 Opioid use, unspecified, uncomplicated: Secondary | ICD-10-CM

## 2016-02-04 MED ORDER — HYDROCODONE-ACETAMINOPHEN 5-325 MG PO TABS: 1.0000 | ORAL_TABLET | Freq: Two times a day (BID) | ORAL | Status: DC | PRN

## 2016-02-04 MED ORDER — ALPRAZOLAM 0.5 MG PO TABS
ORAL_TABLET | ORAL | Status: DC
Start: 2016-02-04 — End: 2016-04-06

## 2016-02-04 MED ORDER — TRAMADOL HCL 50 MG PO TABS
ORAL_TABLET | ORAL | Status: DC
Start: 2016-02-04 — End: 2016-04-06

## 2016-02-04 MED ORDER — LOSARTAN POTASSIUM-HCTZ 100-12.5 MG PO TABS
ORAL_TABLET | ORAL | Status: DC
Start: 2016-02-04 — End: 2016-10-05

## 2016-02-04 NOTE — Progress Notes (Signed)
Subjective:       Patient ID: [name] is a 60 y.o. female.    HPI  LPB - she has had longstanding LBP which has persisted despite PT, epidural steroid injections.  She has low back pain with radiation to both legs; R>L. No sphincter dysfunction. She uses Tramadol BID and occasional Norco for relief.  MRI of L/S spine -  overall mild degenerative changes of the lumbar spine without significant spinal canal stenosis. There is mild neural foraminal narrowing at L3-L4 due to disc bulging, endplate osteophytic spurring, grade 1 anterolisthesis and facet hypertrophy. Recently she has had increased LBP with radiation of pain to left posterio thigh. No associated fever, chills, sphincter dysfunction, weakness.   Hypertension - she has been monitoring BP. Home systolic <130. She is compliant with medication.   Anxiety -  She takes Xanax nightly.  DJD of knees - she had left TKA 12/14/2015. She has completed PT. She walks with cane. She anticipated TKA of contralateral knee in near future.    The following portions of the patient's history were reviewed and updated as appropriate: allergies, current medications and problem list.    Review of Systems        Objective:    Physical Exam  BP 106/76 mmHg  Pulse 102  Temp(Src) 98.7 F (37.1 C) (Oral)  Wt 84.278 kg (185 lb 12.8 oz)  Constitutional:  Alert, oriented X 3. NAD  Head    Atraumatic, normocephalic  Eye:   Conjunctival - pink  Chest:   Unlabored respirations. Lungs clear to auscultation, good breath sounds  Cardiac:  Normal S1&S2 without murmurs. No edema.  Abdomen:  Soft, nontender, no palpable masses, liver or spleen  Skin:   Warm, dry, no rashes in visible areas  Psych:   Normal affect, thought content        Assessment:       1. Chronic bilateral low back pain without sciatica  Hydrocodone + Metabolites, Urine   2. Chronic narcotic use - pain contract 02/04/2016  Hydrocodone + Metabolites, Urine    CANCELED: Miscellaneous Test   3. Primary osteoarthritis  of both knees     4. Essential hypertension     5. Anxiety              Plan:      Procedures    Pain contract discussed and signed. Risks of narcotics reviewed.   Continue current medications with routine laboratory monitoring, annual influenza vaccine, regular exercise,and healthy diet.

## 2016-02-04 NOTE — Progress Notes (Signed)
Have you seen any new specialists/physicians since you were last here?yes    Dr. Horatio Pel surgery      Limb alert protocol reviewed?  Yes    Limb questionnaire reviewed.   Patient denies any limb restrictions.    Pt states she had pap done Dec. 2016

## 2016-02-07 ENCOUNTER — Inpatient Hospital Stay: Payer: No Typology Code available for payment source

## 2016-02-08 LAB — MISCELLANEOUS QUEST TEST

## 2016-02-10 ENCOUNTER — Inpatient Hospital Stay: Payer: No Typology Code available for payment source

## 2016-02-16 ENCOUNTER — Inpatient Hospital Stay: Payer: No Typology Code available for payment source

## 2016-02-18 ENCOUNTER — Inpatient Hospital Stay: Payer: No Typology Code available for payment source

## 2016-04-06 ENCOUNTER — Encounter (INDEPENDENT_AMBULATORY_CARE_PROVIDER_SITE_OTHER): Payer: Self-pay | Admitting: Internal Medicine

## 2016-04-06 ENCOUNTER — Other Ambulatory Visit (INDEPENDENT_AMBULATORY_CARE_PROVIDER_SITE_OTHER): Payer: Self-pay | Admitting: Internal Medicine

## 2016-04-06 ENCOUNTER — Ambulatory Visit (INDEPENDENT_AMBULATORY_CARE_PROVIDER_SITE_OTHER): Payer: No Typology Code available for payment source | Admitting: Internal Medicine

## 2016-04-06 VITALS — BP 112/77 | HR 99 | Temp 97.7°F | Ht 60.63 in | Wt 184.4 lb

## 2016-04-06 DIAGNOSIS — F1190 Opioid use, unspecified, uncomplicated: Secondary | ICD-10-CM

## 2016-04-06 DIAGNOSIS — M170 Bilateral primary osteoarthritis of knee: Secondary | ICD-10-CM

## 2016-04-06 DIAGNOSIS — I10 Essential (primary) hypertension: Secondary | ICD-10-CM

## 2016-04-06 DIAGNOSIS — F419 Anxiety disorder, unspecified: Secondary | ICD-10-CM

## 2016-04-06 MED ORDER — ALPRAZOLAM 0.5 MG PO TABS
ORAL_TABLET | ORAL | Status: DC
Start: 2016-04-06 — End: 2016-07-31

## 2016-04-06 MED ORDER — BETAMETHASONE DIPROPIONATE 0.05 % EX CREA
TOPICAL_CREAM | Freq: Two times a day (BID) | CUTANEOUS | Status: DC | PRN
Start: 2016-04-06 — End: 2017-12-18

## 2016-04-06 MED ORDER — HYDROCODONE-ACETAMINOPHEN 5-325 MG PO TABS: 1.0000 | ORAL_TABLET | Freq: Two times a day (BID) | ORAL | Status: DC | PRN

## 2016-04-06 MED ORDER — TRAMADOL HCL 50 MG PO TABS
ORAL_TABLET | ORAL | Status: DC
Start: 2016-04-06 — End: 2016-07-31

## 2016-04-06 NOTE — Progress Notes (Signed)
Subjective:       Patient ID: [name] is a 60 y.o. female with MMP for follow up. She needs refill of narcotic analgesics for right knee pain and chronic LBP.    HPI  LPB - she has had longstanding LBP which has persisted despite PT, epidural steroid injections.  She has low back pain with radiation to both legs; R>L. No sphincter dysfunction. She uses Tramadol BID and occasional Norco for relief.  MRI of L/S spine -  overall mild degenerative changes of the lumbar spine without significant spinal canal stenosis. There is mild neural foraminal narrowing at L3-L4 due to disc bulging, endplate osteophytic spurring, grade 1 anterolisthesis and facet hypertrophy. Recently she has had increased LBP with radiation of pain to left posterio thigh. No associated fever, chills, sphincter dysfunction, weakness.   Hypertension - she has been monitoring BP. Home systolic <130. She is compliant with medication.   Anxiety -  She takes Xanax nightly.  DJD of knees - she had left TKA 12/14/2015. She has completed PT. She walks with cane. She anticipated TKA of contralateral knee in near future.    The following portions of the patient's history were reviewed and updated as appropriate: allergies, current medications, past family history, past medical history, past social history, past surgical history and problem list.    Review of Systems   Musculoskeletal: Positive for back pain.   Skin: Negative.    Neurological: Negative for dizziness, light-headedness and headaches.   Psychiatric/Behavioral: Negative for self-injury, dysphoric mood and decreased concentration.   All other systems reviewed and are negative.          Objective:    Physical Exam  BP 112/77 mmHg  Pulse 99  Temp(Src) 97.7 F (36.5 C) (Oral)  Ht 1.54 m (5' 0.63")  Wt 83.643 kg (184 lb 6.4 oz)  BMI 35.27 kg/m2  Constitutional:  Alert, oriented X 3. NAD  Head    Atraumatic, normocephalic  Eye:   Conjunctival - pink  Chest:   Unlabored respirations.  Lungs clear to auscultation, good breath sounds  Cardiac:  Normal S1&S2 without murmurs. No edema.  Abdomen:  Soft, nontender, no palpable masses, liver or spleen  Back   No CVA or spinous tenderness  Skin:   Warm, dry, no rashes in visible areas  Psych:   Normal affect, thought content    Lab Results   Component Value Date    WBC 13.03* 12/15/2015    HGB 10.8* 12/15/2015    HCT 33.6* 12/15/2015    PLT 251 12/15/2015    ALT 39 09/10/2015    AST 32 09/10/2015    NA 142 12/16/2015    K 4.2 12/16/2015    CL 105 12/16/2015    CREAT 0.8 12/16/2015    BUN 11 12/16/2015    CO2 27 12/16/2015    GLU 100 12/16/2015           Assessment:       1. Essential hypertension     2. Primary osteoarthritis of both knees     3. Chronic narcotic use - pain contract 02/04/2016     4. Anxiety              Plan:      Procedures    PMP reviewed. Start date for next prescription 05/05/2016.  Continue current medications with routine laboratory monitoring, annual influenza vaccine, seat belts, regular dental visits,regular exercise,and healthy diet.

## 2016-04-06 NOTE — Progress Notes (Signed)
Have you seen any new specialists/physicians since you were last here?      Limb alert protocol reviewed?  Yes    Limb questionnaire reviewed.   Patient denies any limb restrictions.

## 2016-04-07 ENCOUNTER — Telehealth (INDEPENDENT_AMBULATORY_CARE_PROVIDER_SITE_OTHER): Payer: Self-pay | Admitting: Internal Medicine

## 2016-04-07 NOTE — Telephone Encounter (Signed)
Called rx will fill as ointment for diprolene.

## 2016-04-07 NOTE — Telephone Encounter (Signed)
[facility] called requested change in Rx - pt said they wanted betamethasone dipropionate (DIPROLENE) 0.05 % cream ointment or gel    Please call in to approve   [id number] - [place], [place] - [address] [phone] (Phone)  [phone] (Fax)

## 2016-04-07 NOTE — Telephone Encounter (Signed)
[facility] called checking on the message status.

## 2016-05-01 ENCOUNTER — Telehealth (INDEPENDENT_AMBULATORY_CARE_PROVIDER_SITE_OTHER): Payer: Self-pay | Admitting: Internal Medicine

## 2016-05-01 NOTE — Telephone Encounter (Signed)
Pt called stating that she is having left knee pain very badly after the knee replacement surgery and is requesting to get earlier refill for HYDROcodone-acetaminophen (NORCO) 5-325 MG per tablet & traMADol (ULTRAM) 50 MG tablet. Please check with Dr. Jerolyn Center and call her back at [phone].

## 2016-05-01 NOTE — Telephone Encounter (Signed)
Spoke to pt, stated she is out of both meds -see notes below, pt has scripts, can't refill til Friday. Requesting early refill.    Pt states is in pain from knee surgery.    Please advise.

## 2016-05-02 ENCOUNTER — Telehealth (INDEPENDENT_AMBULATORY_CARE_PROVIDER_SITE_OTHER): Payer: Self-pay | Admitting: Internal Medicine

## 2016-05-02 NOTE — Telephone Encounter (Signed)
Patient called states she is ok to wait until Friday for refill

## 2016-05-02 NOTE — Telephone Encounter (Signed)
Called pt to f/u. Per Dr. Jerolyn Center, pt can talk to surgeon for refill order before Fri.

## 2016-05-04 NOTE — Telephone Encounter (Signed)
No early refill.  

## 2016-07-10 ENCOUNTER — Telehealth (INDEPENDENT_AMBULATORY_CARE_PROVIDER_SITE_OTHER): Payer: Self-pay | Admitting: Internal Medicine

## 2016-07-10 NOTE — Telephone Encounter (Signed)
Pt called requesting mammogram order.

## 2016-07-12 ENCOUNTER — Other Ambulatory Visit (INDEPENDENT_AMBULATORY_CARE_PROVIDER_SITE_OTHER): Payer: Self-pay | Admitting: Internal Medicine

## 2016-07-12 DIAGNOSIS — Z1239 Encounter for other screening for malignant neoplasm of breast: Secondary | ICD-10-CM

## 2016-07-12 NOTE — Telephone Encounter (Signed)
done

## 2016-07-12 NOTE — Telephone Encounter (Signed)
Please advise 

## 2016-07-13 NOTE — Telephone Encounter (Signed)
Called pt to advise mammogram ordered. Copy of order printed and placed in pick up drawer.

## 2016-07-27 ENCOUNTER — Telehealth (INDEPENDENT_AMBULATORY_CARE_PROVIDER_SITE_OTHER): Payer: Self-pay

## 2016-07-27 NOTE — Telephone Encounter (Signed)
INC Coordinator reached out to the patient to see if we could be of any assistance in managing their health care needs. The patient stated that she has chronic pain and is going to get a referral to the pain clinic, she thought she would be seeing him next week. I referred the patient to the customer service center to assist her in finding the cost/copay. I also referred the patient to the 24 hour nurse line that is free for her to use. The number was given to the patient and she declined any other resources.     Felipa Emory, LPN   [facility]

## 2016-07-31 ENCOUNTER — Ambulatory Visit (INDEPENDENT_AMBULATORY_CARE_PROVIDER_SITE_OTHER): Payer: No Typology Code available for payment source | Admitting: Internal Medicine

## 2016-07-31 ENCOUNTER — Encounter (INDEPENDENT_AMBULATORY_CARE_PROVIDER_SITE_OTHER): Payer: Self-pay | Admitting: Internal Medicine

## 2016-07-31 VITALS — BP 126/81 | HR 103 | Temp 98.2°F | Wt 185.2 lb

## 2016-07-31 DIAGNOSIS — G8929 Other chronic pain: Secondary | ICD-10-CM

## 2016-07-31 DIAGNOSIS — F1190 Opioid use, unspecified, uncomplicated: Secondary | ICD-10-CM

## 2016-07-31 DIAGNOSIS — M545 Low back pain: Secondary | ICD-10-CM

## 2016-07-31 DIAGNOSIS — Z23 Encounter for immunization: Secondary | ICD-10-CM

## 2016-07-31 DIAGNOSIS — M5416 Radiculopathy, lumbar region: Secondary | ICD-10-CM

## 2016-07-31 MED ORDER — TRAMADOL HCL 50 MG PO TABS
ORAL_TABLET | ORAL | 0 refills | Status: DC
Start: 2016-07-31 — End: 2016-10-31

## 2016-07-31 MED ORDER — ALPRAZOLAM 0.5 MG PO TABS
ORAL_TABLET | ORAL | 0 refills | Status: DC
Start: 2016-07-31 — End: 2016-10-31

## 2016-07-31 MED ORDER — GABAPENTIN 100 MG PO CAPS
100.0000 mg | ORAL_CAPSULE | Freq: Three times a day (TID) | ORAL | 1 refills | Status: DC
Start: 2016-07-31 — End: 2017-10-04

## 2016-07-31 MED ORDER — HYDROCODONE-ACETAMINOPHEN 5-325 MG PO TABS: 1.0000 | ORAL_TABLET | Freq: Two times a day (BID) | ORAL | 0 refills | Status: DC | PRN

## 2016-07-31 MED ORDER — NALOXONE HCL 4 MG/0.1ML NA LIQD
NASAL | 0 refills | Status: DC
Start: 2016-07-31 — End: 2017-07-24

## 2016-07-31 NOTE — Progress Notes (Deleted)
Subjective:       Patient ID: [name] is a 60 y.o. female.    HPI    {Common ambulatory SmartLinks:19316}    Review of Systems        Objective:    Physical Exam        Assessment:       ***      Plan:      Procedures    ***

## 2016-07-31 NOTE — Progress Notes (Signed)
Have you seen any new specialists/physicians since you were last here?no      Limb alert protocol reviewed?  Yes    Limb questionnaire reviewed.   Patient denies any limb restrictions.    Pt aware due mammogram and will schedule appt.    Flu shot     Patient presented to the office for fluzone administration.  Received injection in the Left arm.  No reaction was noted and patient left in good condition.

## 2016-07-31 NOTE — Progress Notes (Signed)
Subjective:       Patient ID: [name] is a 60 y.o. female with MMP c/o LLQ abdominal pain.    HPI   New problem: For the past 3 weeks she c/o sharp constant pain superior to left iliac crest. Nothing seems to improve or worse pain. No gi/gu symptoms. n fever, chills, antecedent trauma.    LPB - she has had longstanding LBP which has persisted despite PT, epidural steroid injections.  She has low back pain with radiation to both legs; R>L. No sphincter dysfunction. She uses Tramadol BID and occasional Norco for relief.  MRI of L/S spine -  overall mild degenerative changes of the lumbar spine without significant spinal canal stenosis. There is mild neural foraminal narrowing at L3-L4 due to disc bulging, endplate osteophytic spurring, grade 1 anterolisthesis and facet hypertrophy. Recently she has had increased LBP with radiation of pain to left posterio thigh. No associated fever, chills, sphincter dysfunction, weakness.   Hypertension - she has been monitoring BP. Home systolic <130. She is compliant with medication.   Anxiety -  She takes Xanax nightly.  DJD of knees - she had left TKA 12/14/2015. She has completed PT. She walks with cane. She anticipated TKA of contralateral knee in near future.    The following portions of the patient's history were reviewed and updated as appropriate: allergies, current medications and problem list.    Review of Systems        Objective:    Physical Exam  BP 126/81   Pulse (!) 103   Temp 98.2 F (36.8 C) (Oral)   Wt 84 kg (185 lb 3.2 oz)   BMI 35.42 kg/m   Constitutional:  Alert, oriented X 3. NAD  Head    Atraumatic, normocephalic  Abdomen:  Soft,  mild tender supeerior to left iliac crest; no palpable masses, liver or spleen  Back   No CVA or spinous tenderness  Neuro   Intact  Skin:   Warm, dry, no rashes in visible areas  Psych:   Normal affect, thought content        Assessment:       1. ? Lumbar radiculopathy     2. Chronic midline low back pain without  sciatica  PMP Drug Monitoring, 13 Panel, Urine   3. Chronic narcotic use     4. Need for prophylactic vaccination and inoculation against influenza  Flu vaccine 3 years & up ([id number])           Plan:      Procedures    Add  gabapentin 100 mg - gradually increasing to TID.   Continue other medications with routine laboratory monitoring, annual influenza vaccine, regular exercise,and healthy diet.

## 2016-08-01 ENCOUNTER — Other Ambulatory Visit: Payer: Self-pay

## 2016-08-01 ENCOUNTER — Other Ambulatory Visit (INDEPENDENT_AMBULATORY_CARE_PROVIDER_SITE_OTHER): Payer: Self-pay | Admitting: Internal Medicine

## 2016-08-05 LAB — PMP DRUG MONITORING, 13 PANEL, URINE
7-Aminoclonazepam: NOT DETECTED ng/mg creat
Alcohol, Ethyl: NEGATIVE
Alcohol, Ethyl: NOT DETECTED g/dL
Alfentanil: NOT DETECTED ng/mg creat
Alpha-Hydroxyalprazolam: 170 ng/mg creat
Alpha-Hydroxytriazolam: NOT DETECTED ng/mg creat
Alprazolam: NOT DETECTED ng/mg creat
Amobarbital: NOT DETECTED
Amphetamine: NOT DETECTED ng/mg creat
Amphetamines: NEGATIVE
Barbital: NOT DETECTED
Barbiturates: NEGATIVE
Benzodiazepines: POSITIVE
Benzoylecgonine: NOT DETECTED ng/mg creat
Buprenorphine: NEGATIVE
Buprenorphine: NOT DETECTED ng/mg creat
Butabarbital: NOT DETECTED
Butalbital: NOT DETECTED
Cannabinoids: NEGATIVE
Carboxy-Thc: NOT DETECTED ng/mg creat
Clonazepam: NOT DETECTED ng/mg creat
Cocaethylene: NOT DETECTED ng/mg creat
Cocaine & Metabolite: NEGATIVE
Cocaine: NOT DETECTED ng/mg creat
Codeine: NOT DETECTED ng/mg creat
Creatinine: 125 mg/dL
Desalkylflurazepam: NOT DETECTED ng/mg creat
Desmethyldiazepam: NOT DETECTED ng/mg creat
Diazepam: NOT DETECTED ng/mg creat
Dihydrocodeine: 162 ng/mg creat
Eddp (Methadone Mtb): NOT DETECTED ng/mg creat
Fentanyl: NOT DETECTED ng/mg creat
Hydrocodone: 286 ng/mg creat
Hydromorphone: 126 ng/mg creat
Lorazepam: NOT DETECTED ng/mg creat
Mda (Ecstasy Mtb): NOT DETECTED ng/mg creat
Mdma (Ecstasy): NOT DETECTED ng/mg creat
Mephobarbital: NOT DETECTED
Methadone: NEGATIVE
Methamphetamine: NOT DETECTED ng/mg creat
Midazolam: NOT DETECTED ng/mg creat
Morphine: NOT DETECTED ng/mg creat
Norbuprenorphine: NOT DETECTED ng/mg creat
Norcodeine: NOT DETECTED ng/mg creat
Norfentanyl: NOT DETECTED ng/mg creat
Norhydrocodone: 449 ng/mg creat
Noroxycodone: NOT DETECTED ng/mg creat
Noroxymorphone: NOT DETECTED ng/mg creat
Opiate Class: POSITIVE
Oxazepam: NOT DETECTED ng/mg creat
Oxycodone Class: NEGATIVE
Oxycodone: NOT DETECTED ng/mg creat
Oxymorphone: NOT DETECTED ng/mg creat
Pentobarbital: NOT DETECTED
Phenobarbital: NOT DETECTED
Secobarbital: NOT DETECTED
Sufentanil: NOT DETECTED ng/mg creat
Tapentadol: NEGATIVE
Tapentadol: NOT DETECTED ng/mg creat
Temazepam: NOT DETECTED ng/mg creat
Thiopental: NOT DETECTED
Tramadol & Metabolite: POSITIVE
UR Fentanyl & Analogues: NEGATIVE
UR Methadone: NOT DETECTED ng/mg creat

## 2016-09-29 ENCOUNTER — Other Ambulatory Visit: Payer: Self-pay | Admitting: Physician Assistant

## 2016-09-29 DIAGNOSIS — M545 Low back pain: Secondary | ICD-10-CM

## 2016-09-29 DIAGNOSIS — M25552 Pain in left hip: Secondary | ICD-10-CM

## 2016-09-29 DIAGNOSIS — M7918 Myalgia, other site: Secondary | ICD-10-CM

## 2016-09-29 DIAGNOSIS — G8929 Other chronic pain: Secondary | ICD-10-CM

## 2016-10-04 ENCOUNTER — Emergency Department: Payer: No Typology Code available for payment source

## 2016-10-04 ENCOUNTER — Emergency Department
Admission: EM | Admit: 2016-10-04 | Discharge: 2016-10-04 | Disposition: A | Discharge status: Other Acute Inpatient Hospital | Payer: No Typology Code available for payment source | Attending: Emergency Medicine | Admitting: Emergency Medicine

## 2016-10-04 ENCOUNTER — Observation Stay
Admission: AD | Admit: 2016-10-04 | Discharge: 2016-10-05 | Disposition: A | Discharge status: Home / Self Care | Payer: No Typology Code available for payment source | Source: Other Acute Inpatient Hospital | Attending: Internal Medicine | Admitting: Internal Medicine

## 2016-10-04 ENCOUNTER — Other Ambulatory Visit: Payer: No Typology Code available for payment source

## 2016-10-04 ENCOUNTER — Telehealth (INDEPENDENT_AMBULATORY_CARE_PROVIDER_SITE_OTHER): Payer: Self-pay | Admitting: Internal Medicine

## 2016-10-04 ENCOUNTER — Observation Stay: Payer: No Typology Code available for payment source | Admitting: Internal Medicine

## 2016-10-04 DIAGNOSIS — R42 Dizziness and giddiness: Secondary | ICD-10-CM | POA: Insufficient documentation

## 2016-10-04 DIAGNOSIS — E669 Obesity, unspecified: Secondary | ICD-10-CM | POA: Insufficient documentation

## 2016-10-04 DIAGNOSIS — G8929 Other chronic pain: Secondary | ICD-10-CM | POA: Insufficient documentation

## 2016-10-04 DIAGNOSIS — I10 Essential (primary) hypertension: Secondary | ICD-10-CM | POA: Insufficient documentation

## 2016-10-04 DIAGNOSIS — M1612 Unilateral primary osteoarthritis, left hip: Secondary | ICD-10-CM | POA: Insufficient documentation

## 2016-10-04 DIAGNOSIS — Z91040 Latex allergy status: Secondary | ICD-10-CM | POA: Insufficient documentation

## 2016-10-04 DIAGNOSIS — Z79899 Other long term (current) drug therapy: Secondary | ICD-10-CM | POA: Insufficient documentation

## 2016-10-04 DIAGNOSIS — R2689 Other abnormalities of gait and mobility: Secondary | ICD-10-CM | POA: Insufficient documentation

## 2016-10-04 DIAGNOSIS — F419 Anxiety disorder, unspecified: Secondary | ICD-10-CM | POA: Insufficient documentation

## 2016-10-04 DIAGNOSIS — R51 Headache: Secondary | ICD-10-CM | POA: Insufficient documentation

## 2016-10-04 DIAGNOSIS — F17210 Nicotine dependence, cigarettes, uncomplicated: Secondary | ICD-10-CM | POA: Insufficient documentation

## 2016-10-04 DIAGNOSIS — M170 Bilateral primary osteoarthritis of knee: Secondary | ICD-10-CM | POA: Insufficient documentation

## 2016-10-04 LAB — CBC
Absolute NRBC: 0 10*3/uL
Hematocrit: 45.5 % (ref 37.0–47.0)
Hgb: 14.8 g/dL (ref 12.0–16.0)
MCH: 29.8 pg (ref 28.0–32.0)
MCHC: 32.5 g/dL (ref 32.0–36.0)
MCV: 91.7 fL (ref 80.0–100.0)
MPV: 11 fL (ref 9.4–12.3)
Nucleated RBC: 0 /100 WBC (ref 0.0–1.0)
Platelets: 307 10*3/uL (ref 140–400)
RBC: 4.96 10*6/uL (ref 4.20–5.40)
RDW: 13 % (ref 12–15)
WBC: 10.13 10*3/uL (ref 3.50–10.80)

## 2016-10-04 LAB — COMPREHENSIVE METABOLIC PANEL
ALT: 48 U/L (ref 0–55)
AST (SGOT): 31 U/L (ref 5–34)
Albumin/Globulin Ratio: 1 (ref 0.9–2.2)
Albumin: 3.8 g/dL (ref 3.5–5.0)
Alkaline Phosphatase: 98 U/L (ref 37–106)
Anion Gap: 7 (ref 5.0–15.0)
BUN: 10 mg/dL (ref 7–19)
Bilirubin, Total: 0.6 mg/dL (ref 0.2–1.2)
CO2: 31 mEq/L — ABNORMAL HIGH (ref 22–29)
Calcium: 10.6 mg/dL — ABNORMAL HIGH (ref 8.5–10.5)
Chloride: 102 mEq/L (ref 100–111)
Creatinine: 0.9 mg/dL (ref 0.6–1.0)
Globulin: 4 g/dL — ABNORMAL HIGH (ref 2.0–3.6)
Glucose: 120 mg/dL — ABNORMAL HIGH (ref 70–100)
Potassium: 3.6 mEq/L (ref 3.5–5.1)
Protein, Total: 7.8 g/dL (ref 6.0–8.3)
Sodium: 140 mEq/L (ref 136–145)

## 2016-10-04 LAB — URINALYSIS
Bilirubin, UA: NEGATIVE
Glucose, UA: NEGATIVE
Ketones UA: NEGATIVE
Leukocyte Esterase, UA: NEGATIVE
Nitrite, UA: NEGATIVE
Protein, UR: NEGATIVE
Specific Gravity UA: 1.01 (ref 1.001–1.035)
Urine pH: 7 (ref 5.0–8.0)
Urobilinogen, UA: 0.2 mg/dL

## 2016-10-04 LAB — ECG 12-LEAD
Atrial Rate: 97 {beats}/min
P Axis: 70 degrees
P-R Interval: 164 ms
Q-T Interval: 360 ms
QRS Duration: 82 ms
QTC Calculation (Bezet): 457 ms
R Axis: 77 degrees
T Axis: 55 degrees
Ventricular Rate: 97 {beats}/min

## 2016-10-04 LAB — PT/INR
PT INR: 1 (ref 0.9–1.1)
PT: 13.7 s (ref 12.6–15.0)

## 2016-10-04 LAB — URINE MICROSCOPIC: RBC, UA: 0 /hpf (ref 0–5)

## 2016-10-04 LAB — LIPID PANEL
Cholesterol / HDL Ratio: 5
Cholesterol: 204 mg/dL — ABNORMAL HIGH (ref 0–199)
HDL: 41 mg/dL (ref 40–9999)
LDL Calculated: 140 mg/dL — ABNORMAL HIGH (ref 0–99)
Triglycerides: 116 mg/dL (ref 34–149)
VLDL Calculated: 23 mg/dL (ref 10–40)

## 2016-10-04 LAB — HEMOLYSIS INDEX: Hemolysis Index: 8 (ref 0–18)

## 2016-10-04 LAB — TROPONIN I: Troponin I: <0.01 ng/mL (ref 0.00–0.09)

## 2016-10-04 LAB — GFR: EGFR: >60

## 2016-10-04 LAB — APTT: PTT: 33 s (ref 23–37)

## 2016-10-04 MED ORDER — MECLIZINE HCL 12.5 MG PO TABS
25.0000 mg | ORAL_TABLET | Freq: Once | ORAL | Status: AC
Start: 2016-10-04 — End: 2016-10-04
  Administered 2016-10-04: 25 mg via ORAL
  Filled 2016-10-04: qty 2

## 2016-10-04 MED ORDER — ENOXAPARIN SODIUM 40 MG/0.4ML SC SOLN
40.0000 mg | Freq: Every day | SUBCUTANEOUS | Status: DC
Start: 2016-10-04 — End: 2016-10-04

## 2016-10-04 MED ORDER — HYDROCODONE-ACETAMINOPHEN 5-325 MG PO TABS
1.0000 | ORAL_TABLET | Freq: Two times a day (BID) | ORAL | Status: DC | PRN
  Administered 2016-10-04 – 2016-10-05 (×2): 1 via ORAL
  Filled 2016-10-04 (×3): qty 1

## 2016-10-04 MED ORDER — ONDANSETRON 4 MG PO TBDP
4.0000 mg | ORAL_TABLET | Freq: Four times a day (QID) | ORAL | Status: DC | PRN
Start: 2016-10-04 — End: 2016-10-05
  Administered 2016-10-04: 4 mg via ORAL
  Filled 2016-10-04: qty 1

## 2016-10-04 MED ORDER — ALPRAZOLAM 0.25 MG PO TABS
0.5000 mg | ORAL_TABLET | Freq: Every evening | ORAL | Status: DC
Start: 2016-10-04 — End: 2016-10-05
  Administered 2016-10-04: 0.5 mg via ORAL
  Filled 2016-10-04: qty 2

## 2016-10-04 MED ORDER — OXYCODONE-ACETAMINOPHEN 5-325 MG PO TABS
1.0000 | ORAL_TABLET | Freq: Once | ORAL | Status: AC
Start: 2016-10-04 — End: 2016-10-04
  Administered 2016-10-04: 1 via ORAL
  Filled 2016-10-04: qty 1

## 2016-10-04 MED ORDER — MECLIZINE HCL 12.5 MG PO TABS
25.0000 mg | ORAL_TABLET | Freq: Three times a day (TID) | ORAL | Status: DC | PRN
Start: 2016-10-04 — End: 2016-10-05
  Administered 2016-10-04 – 2016-10-05 (×2): 25 mg via ORAL
  Filled 2016-10-04 (×2): qty 2

## 2016-10-04 MED ORDER — ACETAMINOPHEN 325 MG PO TABS
650.0000 mg | ORAL_TABLET | ORAL | Status: DC | PRN
Start: 2016-10-04 — End: 2016-10-05

## 2016-10-04 MED ORDER — ONDANSETRON HCL 4 MG/2ML IJ SOLN
4.0000 mg | Freq: Once | INTRAMUSCULAR | Status: DC
Start: 2016-10-04 — End: 2016-10-04
  Filled 2016-10-04: qty 2

## 2016-10-04 MED ORDER — ENOXAPARIN SODIUM 40 MG/0.4ML SC SOLN
40.0000 mg | Freq: Every day | SUBCUTANEOUS | Status: DC
Start: 2016-10-05 — End: 2016-10-05
  Administered 2016-10-05: 40 mg via SUBCUTANEOUS
  Filled 2016-10-04: qty 0.4

## 2016-10-04 MED ORDER — METOCLOPRAMIDE HCL 5 MG/ML IJ SOLN
5.0000 mg | Freq: Four times a day (QID) | INTRAMUSCULAR | Status: DC | PRN
Start: 2016-10-04 — End: 2016-10-05

## 2016-10-04 MED ORDER — ONDANSETRON HCL 4 MG/2ML IJ SOLN
4.0000 mg | Freq: Four times a day (QID) | INTRAMUSCULAR | Status: DC | PRN
Start: 2016-10-04 — End: 2016-10-05
  Filled 2016-10-04: qty 2

## 2016-10-04 MED ORDER — ONDANSETRON 4 MG PO TBDP
4.0000 mg | ORAL_TABLET | Freq: Once | ORAL | Status: AC
Start: 2016-10-04 — End: 2016-10-04
  Administered 2016-10-04: 4 mg via ORAL
  Filled 2016-10-04: qty 1

## 2016-10-04 MED ORDER — TRAMADOL HCL 50 MG PO TABS
50.0000 mg | ORAL_TABLET | Freq: Two times a day (BID) | ORAL | Status: DC | PRN
Start: 2016-10-04 — End: 2016-10-05
  Administered 2016-10-04 – 2016-10-05 (×2): 50 mg via ORAL
  Filled 2016-10-04 (×2): qty 1

## 2016-10-04 MED ORDER — SODIUM CHLORIDE 0.9 % IV SOLN
INTRAVENOUS | Status: DC
Start: 2016-10-04 — End: 2016-10-05

## 2016-10-04 MED ORDER — ACETAMINOPHEN 650 MG RE SUPP
650.0000 mg | RECTAL | Status: DC | PRN
Start: 2016-10-04 — End: 2016-10-05

## 2016-10-04 MED ORDER — DIAZEPAM 5 MG/ML IJ SOLN
2.0000 mg | Freq: Once | INTRAMUSCULAR | Status: DC
Start: 2016-10-04 — End: 2016-10-04
  Filled 2016-10-04: qty 2

## 2016-10-04 NOTE — ED Notes (Signed)
Verbalizes decrease in nausea; states dizziness unchanged.

## 2016-10-04 NOTE — ED Triage Notes (Signed)
Ambulatory to room. States dizziness started last night followed by nausea. Dizziness worse with lying down. Had similar, less severe symptoms a few years ago which resolved after using OTC motion sickness pill. States she feels like the room is spinning.

## 2016-10-04 NOTE — ED Provider Notes (Signed)
[facility]         CLINICAL SUMMARY          Diagnosis:    .     Final diagnoses:   Vertigo         MDM Notes:      [name] is Mueller 61 y.o. female with acute onset of vertigo Mueller/w headache and nausea. CT and EKG unremarkable. Blood draw unsuccessful after multiple attempts. Pt will be admitted for further workup and bed coordinator aware of the need to obtain IV access upon her arrival.      Disposition:         Observation Admit      ED Disposition     ED Disposition Condition Date/Time Comment    Admit to [facility]  Wed Oct 04, 2016  5:03 PM Dr. Gerlean Ren is accepting, bed number 422, bed 1          New Prescriptions    No medications on file                 CLINICAL INFORMATION        HPI:      Chief Complaint: Dizziness and Nausea  .    [name] is Mueller 61 y.o. female who  has Mueller past medical history of Abnormal vision; Anxiety; Arthritis; Carpal tunnel syndrome on both sides; Difficulty walking; Eczema; Hypertensive disorder; Lower back pain; Neck pain; Neuropathy; and Vein disorder. and  has Mueller past surgical history that includes Ganglion cyst excision (Right, 2007); Knee arthroscopy (Right, 09/24/2014); Knee arthroscopy (Left, 03/2015); Colonoscopy (2016); Induced abortion; Vaginal delivery; and ARTHROPLASTY, KNEE, TOTAL (Left, 12/14/2015). who presents with sudden onset constant dizziness starting last night at 11PM described as the "room spinning". Dizziness worse with laying down flat and worse with movement. Symptoms improve with staying perfectly still sitting up. Mueller/w moderate persistent nausea. Symptoms were preceded by Mueller mild headache which has now resolved. Also notes light sensitivity last night that has resolved today and soreness in the back of the neck.    Pt has Mueller remote h/o similar and was told she has an inner ear problem.  No recent cold. No syncope or LOC. No head trauma. No vision changes.    History obtained from: Patient      ROS:       Positive and negative ROS elements as per HPI.  All other systems reviewed and negative.      Physical Exam:      Pulse (!) 106  BP 147/89  Resp 18  SpO2 98 %  Temp 97.7 F (36.5 C)    Constitutional: Vital signs reviewed. Well appearing.  Head: Normocephalic, atraumatic  Eyes: No conjunctival injection. No discharge.EOMI. PERRL. No obvious nystagmus.  ENT: Mucous membranes moist,Ears and throat wnl  Neck: Normal range of motion. Neck supple. Perispinal muscle tenderness.  Respiratory/Chest: Clear to auscultation. No respiratory distress.   Cardiovascular: Tachycardic rate and rhythm. No murmur.   Abdomen: Soft and non-tender. No guarding. No masses or hepatosplenomegaly.  Genitourinary:   UpperExtremity:FROM,no abnormality noted  LowerExtremity: No edema. No cyanosis.FROM  Neurological: CNs II- XII intact, Motor Sensory and Gait normal,Finger to nose normal bilaterally ,DTRs symmetrical and intact, Babinski negative Memory and Speech normal.  Skin: Warm and dry. No rash.  Lymphatic: No cervical lymphadenopathy.  Psychiatric: Normal affect. Normal concentration.  Back  No CVAT,no abnormalities,  Full range of motion  PAST HISTORY        Primary Care Provider: Santiago Glad, [name]        PMH/PSH:    .     Past Medical History:   Diagnosis Date   . Abnormal vision     glasses   . Anxiety     uses xanax @ bedtime   . Arthritis     bilateral knees   . Carpal tunnel syndrome on both sides     stiffness noted    . Difficulty walking     limps   . Eczema     has prescription cream   . Hypertensive disorder     stable meds   . Lower back pain     sciatica radiates down LLE: Pain range= 0-10   . Neck pain     related to lower back but significant at this time    . Neuropathy     numbness upon awakening from low back radiating down LLE .Marland Kitchen. diminishes after awhile after moving around   . 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        She has Mueller past surgical history that includes Ganglion cyst excision  (Right, 2007); Knee arthroscopy (Right, 09/24/2014); Knee arthroscopy (Left, 03/2015); Colonoscopy (2016); Induced abortion; Vaginal delivery; and ARTHROPLASTY, KNEE, TOTAL (Left, 12/14/2015).      Social/Family History:      She reports that she has been smoking Cigarettes.  She has Mueller 7.50 pack-year smoking history. She has never used smokeless tobacco. She reports that she does not drink alcohol or use drugs.    Family History   Problem Relation Age of Onset   . Hypertension Mother    . Stroke Mother    . Hypertension Father    . Kidney disease Brother          Listed Medications on Arrival:    .     Previous Medications    ALPRAZOLAM (XANAX) 0.5 MG TABLET    TAKE 1 TABLET BY MOUTH NIGHTLY AS NEEDED    BETAMETHASONE DIPROPIONATE (DIPROLENE) 0.05 % CREAM    Apply topically 2 (two) times daily as needed.    GABAPENTIN (NEURONTIN) 100 MG CAPSULE    Take 1 capsule (100 mg total) by mouth 3 (three) times daily.    HYDROCODONE-ACETAMINOPHEN (NORCO) 5-325 MG PER TABLET    Take 1 tablet by mouth 2 (two) times daily as needed for Pain.Do Not fill before 05/05/2016.    LOSARTAN-HYDROCHLOROTHIAZIDE (HYZAAR) 100-12.5 MG PER TABLET    TAKE 1 TABLET BY MOUTH DAILY    NALOXONE (NARCAN) 4 MG/0.1ML LIQUID NASAL SPRAY    1 spray intranasally. If pt does not respond or relapses into respiratory depression call 911. Give additional doses every 2-3 min.    TRAMADOL (ULTRAM) 50 MG TABLET    TAKE 1 TABLET BY MOUTH EVERY 12 HOURS AS NEEDED FOR PAIN      Allergies: She is allergic to latex.            VISIT INFORMATION        Clinical Course in the ED:      ED Course    2:10 PM Head CT ordered due to potential stroke presentation manifest by vertigo and headache.    3:00 PM Delay in sending labs due to trouble getting an IV started. Will continue to attempt.    4:40 PM Patient's nausea has improved but her dizziness is still there.  Medications Given in the ED:    .     ED Medication Orders     Start Ordered     Status Ordering Provider     10/04/16 1706 10/04/16 1705  oxyCODONE-acetaminophen (PERCOCET) 5-325 MG per tablet 1 tablet  Once     Route: Oral  Ordered Dose: 1 tablet     Ordered [name]    10/04/16 1549 10/04/16 1548  ondansetron (ZOFRAN-ODT) disintegrating tablet 4 mg  Once     Route: Oral  Ordered Dose: 4 mg     Last MAR action:  Given [name] Mueller    10/04/16 1549 10/04/16 1548  meclizine (ANTIVERT) tablet 25 mg  Once     Route: Oral  Ordered Dose: 25 mg     Last MAR action:  Given [name]    10/04/16 1410 10/04/16 1409    Once     Route: Intravenous  Ordered Dose: 4 mg     Discontinued [name]    10/04/16 1410 10/04/16 1409    Once     Route: Intravenous  Ordered Dose: 2 mg     Discontinued [name]            Procedures:      Procedures      Interpretations:      O2 sat-           saturation: 98 %; Oxygen use: room air; Interpretation: Normal       Stroke/TIA -     Reason for not initiating tPA (alteplase) within 0-3 hrs of onset of symptoms: Onset time greater than or equal to 3 hours     EKG -             interpreted by me: NSR rate of 97. Normal intervals. Normal axis. No abnormalities.     Monitor -         interpreted by me: NSR rate of 90s.            RESULTS        Recent Lab Results:      Results     Procedure Component Value Units Date/Time    UA with reflex to micro (pts  3 + yrs) [[id number]]  (Abnormal) Collected:  10/04/16 1520    Specimen:  Urine from Urine, Clean Catch Updated:  10/04/16 1535     Urine Type Clean Catch     Color, UA Yellow     Clarity, UA Clear     Specific Gravity UA 1.010     Urine pH 7.0     Leukocyte Esterase, UA Negative     Nitrite, UA Negative     Protein, UR Negative     Glucose, UA Negative     Ketones UA Negative     Urobilinogen, UA 0.2 mg/dL      Bilirubin, UA Negative     Blood, UA Trace (Mueller)    Microscopic, Urine [[id number]] Collected:  10/04/16 1520     Updated:  10/04/16 1535     RBC, UA 0 -2 /hpf      WBC, UA 0 - 5 /hpf      Squamous Epithelial Cells, Urine 0 - 5 /hpf                Radiology Results:      CT Head WO Contrast   Final Result    No acute process.  Trilby Drummer, [name]    10/04/2016 3:37 PM                  Scribe Attestation:      I was acting as Mueller Neurosurgeon for Rob Bunting, [name] on [name]  Treatment Team: Scribe: Evalee Jefferson    I am the first provider for this patient and I personally performed the services documented. Treatment Team: Scribe: Evalee Jefferson is scribing for me on Mcferran,[name]. This note and the patient instructions accurately reflect work and decisions made by me.  Rob Bunting, [name]                            Rob Bunting, [name]  10/04/16 [phone]

## 2016-10-04 NOTE — ED Notes (Signed)
Informed by lab that patient's lab specimens are hemolyzed. Dr. Quintin Alto informed.

## 2016-10-04 NOTE — H&P (Signed)
[facility]      Patient: [name]  Date: 10/04/2016   DOB: Jan 31, 1956  Admission Date: 10/04/2016   MRN: [id number]  Attending: Jannifer Franklin       I personally saw and examined pt. I have personally discussed the case with NP.  I have reviewed the labs, xrays, physical examination and note by NP.  I agree with assessment and plan as noted by Angelique Holm PA.    On exam:   Awake, alert, not in acute distress  HEENT:  PERRLA, moist mucous membranes, (-) tonsillar congestion  Neck:  Supple  Chest/Lungs:  CTAB, (-) rales, (-) wheezes  CVS:  Distinct S1 and S2, NRRR, (-) murmurs  Abdomen:  Flat, NABS, soft, NT  Ext:  (-) edema  Neuro:  CN 2-12 intact, motor 5/5, slight decreased sensation LLE but this is baseline.      Labs reviewed.       Active Hospital Problems    Diagnosis   . Dizziness     A/P:   Dizziness.   ? Vertigo vs CVA.  MRI/MRA.  Neuro checks     Signed,  Alphonzo Dublin, MD  7:45 AM 10/06/2016       [id number]     History Gathered From: Self    HISTORY AND PHYSICAL     [name] is a 61 y.o. female with a PMHx of hypertension and chronic pain related to left knee and hip osteoarthritis who presented with dizziness and headache. She states the dizziness began on 10/03/2016 around 10pm while she was at work. The dizziness progressively worsened overnight and she presented to Urgent care for evaluation today. She was a direct admit from urgent care for further work up. She does have a distant history of vertigo and she states she took over the counter medication and had complete relief. This episodes seems different and worse. She also complains of a headache which she describes as a pressure that involves her entire head. She has some associated nausea but denies vomiting. She also denies photophobia, nasal discharge, cough and SOB. The dizziness is worse when she changes positions or lays completely flat.     Past Medical History:   Diagnosis Date   .  Abnormal vision     glasses   . Anxiety     uses xanax @ bedtime   . Arthritis     bilateral knees   . Carpal tunnel syndrome on both sides     stiffness noted    . Difficulty walking     limps   . Eczema     has prescription cream   . Hypertensive disorder     stable meds   . Lower back pain     sciatica radiates down LLE: Pain range= 0-10   . Neck pain     related to lower back but significant at this time    . Neuropathy     numbness upon awakening from low back radiating down LLE .Marland Kitchen. diminishes after awhile after moving around   . 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        Past Surgical History:   Procedure Laterality Date   . ARTHROPLASTY, KNEE, TOTAL Left 12/14/2015    Procedure: ARTHROPLASTY, KNEE, TOTAL;  Surgeon: Cynda Familia, MD;  Location: [facility];  Service: Orthopedics;  Laterality: Left;  LEFT TOTAL KNEE ARTHROPLASTY   . COLONOSCOPY  2016  X1 polyp   . GANGLION CYST EXCISION Right 2007   . INDUCED ABORTION     . KNEE ARTHROSCOPY Right 09/24/2014   . KNEE ARTHROSCOPY Left 03/2015   . VAGINAL DELIVERY      X2 spinals ( 4natural deliveries)       Prior to Admission medications    Medication Sig Start Date End Date Taking? Authorizing Provider   ALPRAZolam Prudy Feeler) 0.5 MG tablet TAKE 1 TABLET BY MOUTH NIGHTLY AS NEEDED 07/31/16   Santiago Glad, MD   betamethasone dipropionate (DIPROLENE) 0.05 % cream Apply topically 2 (two) times daily as needed. 04/06/16   Santiago Glad, MD   gabapentin (NEURONTIN) 100 MG capsule Take 1 capsule (100 mg total) by mouth 3 (three) times daily. 07/31/16   Santiago Glad, MD   HYDROcodone-acetaminophen (NORCO) 5-325 MG per tablet Take 1 tablet by mouth 2 (two) times daily as needed for Pain.Do Not fill before 05/05/2016. 07/31/16   Santiago Glad, MD   losartan-hydrochlorothiazide [facility]) 100-12.5 MG per tablet TAKE 1 TABLET BY MOUTH DAILY 02/04/16   Santiago Glad, MD   naloxone [facility]) 4 MG/0.1ML Liquid nasal spray 1 spray intranasally. If pt does not  respond or relapses into respiratory depression call 911. Give additional doses every 2-3 min. 07/31/16   Santiago Glad, MD   traMADol (ULTRAM) 50 MG tablet TAKE 1 TABLET BY MOUTH EVERY 12 HOURS AS NEEDED FOR PAIN 07/31/16   Santiago Glad, MD       Allergies   Allergen Reactions   . Latex Rash       CODE STATUS: full code    PRIMARY CARE MD: Santiago Glad, MD    Family History   Problem Relation Age of Onset   . Hypertension Mother    . Stroke Mother    . Hypertension Father    . Kidney disease Brother        Social History   Substance Use Topics   . Smoking status: Current Every Day Smoker     Packs/day: 0.25     Years: 30.00     Types: Cigarettes   . Smokeless tobacco: Never Used      Comment: keeps on reducing the amount of cigarettes she smokes ...    . Alcohol use No       REVIEW OF SYSTEMS     Ten point review of systems negative or as per HPI and below endorsements.    PHYSICAL EXAM     Vital Signs (most recent): BP 130/78   Pulse 91   Temp 98.4 F (36.9 C) (Oral)   Resp 17   Ht 1.626 m (5\' 4" )   Wt 83 kg (183 lb)   SpO2 98%   BMI 31.41 kg/m   Constitutional: No apparent distress.  Patient speaks freely in full sentences.   HEENT: NC/AT, PERRL, no scleral icterus or conjunctival pallor, no nasal discharge, MMM, oropharynx without erythema or exudate  Neck: trachea midline, supple, no cervical or supraclavicular lymphadenopathy or masses  Cardiovascular: RRR, normal S1 S2, no murmurs, gallops, palpable thrills, no JVD, Non-displaced PMI.  Respiratory: Normal rate. No retractions or increased work of breathing. Clear to auscultation and percussion bilaterally.  Gastrointestinal: +BS, non-distended, soft, non-tender, no rebound or guarding, no hepatosplenomegaly  Genitourinary: no suprapubic or costovertebral angle tenderness  Musculoskeletal: ROM and motor strength grossly normal. No clubbing, edema, or cyanosis. DP and radial pulses 2+ and symmetric. Mild pain noted with  ROM of the Left hip  which is chronic.  Skin: no rashes, jaundice or other lesions  Neurologic: EOMI, CN 2-12 grossly intact. no gross motor or sensory deficits,downward plantar reflexes  Psychiatric: AAOx3, affect and mood appropriate. The patient is alert, interactive, appropriate.    LABS & IMAGING     Recent Results (from the past 24 hour(s))   ECG 12 lead    Collection Time: 10/04/16  2:16 PM   Result Value Ref Range    Ventricular Rate 97 BPM    Atrial Rate 97 BPM    P-R Interval 164 ms    QRS Duration 82 ms    Q-T Interval 360 ms    QTC Calculation (Bezet) 457 ms    P Axis 70 degrees    R Axis 77 degrees    T Axis 55 degrees   UA with reflex to micro (pts  3 + yrs)    Collection Time: 10/04/16  3:20 PM   Result Value Ref Range    Urine Type Clean Catch     Color, UA Yellow Clear - Yellow    Clarity, UA Clear Clear - Hazy    Specific Gravity UA 1.010 1.001 - 1.035    Urine pH 7.0 5.0 - 8.0    Leukocyte Esterase, UA Negative Negative    Nitrite, UA Negative Negative    Protein, UR Negative Negative    Glucose, UA Negative Negative    Ketones UA Negative Negative    Urobilinogen, UA 0.2 0.2 - 2.0 mg/dL    Bilirubin, UA Negative Negative    Blood, UA Trace (A) Negative   Microscopic, Urine    Collection Time: 10/04/16  3:20 PM   Result Value Ref Range    RBC, UA 0 -2 0 - 5 /hpf    WBC, UA 0 - 5 0 - 5 /hpf    Squamous Epithelial Cells, Urine 0 - 5 0 - 25 /hpf   Comprehensive metabolic panel    Collection Time: 10/04/16  9:38 PM   Result Value Ref Range    Glucose 120 (H) 70 - 100 mg/dL    BUN 10 7 - 19 mg/dL    Creatinine 0.9 0.6 - 1.0 mg/dL    Sodium 161 096 - 045 mEq/L    Potassium 3.6 3.5 - 5.1 mEq/L    Chloride 102 100 - 111 mEq/L    CO2 31 (H) 22 - 29 mEq/L    Calcium 10.6 (H) 8.5 - 10.5 mg/dL    Protein, Total 7.8 6.0 - 8.3 g/dL    Albumin 3.8 3.5 - 5.0 g/dL    AST (SGOT) 31 5 - 34 U/L    ALT 48 0 - 55 U/L    Alkaline Phosphatase 98 37 - 106 U/L    Bilirubin, Total 0.6 0.2 - 1.2 mg/dL    Globulin 4.0 (H) 2.0 - 3.6 g/dL     Albumin/Globulin Ratio 1.0 0.9 - 2.2    Anion Gap 7.0 5.0 - 15.0   CBC    Collection Time: 10/04/16  9:38 PM   Result Value Ref Range    WBC 10.13 3.50 - 10.80 x10 3/uL    Hgb 14.8 12.0 - 16.0 g/dL    Hematocrit 40.9 81.1 - 47.0 %    Platelets 307 140 - 400 x10 3/uL    RBC 4.96 4.20 - 5.40 x10 6/uL    MCV 91.7 80.0 - 100.0 fL    MCH 29.8 28.0 - 32.0  pg    MCHC 32.5 32.0 - 36.0 g/dL    RDW 13 12 - 15 %    MPV 11.0 9.4 - 12.3 fL    Nucleated RBC 0.0 0.0 - 1.0 /100 WBC    Absolute NRBC 0.00 0 x10 3/uL   APTT    Collection Time: 10/04/16  9:38 PM   Result Value Ref Range    PTT 33 23 - 37 sec   Protime-INR    Collection Time: 10/04/16  9:38 PM   Result Value Ref Range    PT 13.7 12.6 - 15.0 sec    PT INR 1.0 0.9 - 1.1    PT Anticoag. Given Within 48 hrs. None    Troponin I    Collection Time: 10/04/16  9:38 PM   Result Value Ref Range    Troponin I <0.01 0.00 - 0.09 ng/mL   GFR    Collection Time: 10/04/16  9:38 PM   Result Value Ref Range    EGFR >60.0        MICROBIOLOGY:  Blood Culture: not done  Urine Culture: not done  Antibiotics Started: Not needed    IMAGING (images personally reviewed and concur with radiologist unless otherwise stated below):  MRI Brain WO Contrast    (Results Pending)   MRI Angiogram Head WO Contrast    (Results Pending)   MRI Angiogram Neck WO Contrast    (Results Pending)       CARDIAC:    Markers:    Recent Labs  Lab 10/04/16  2138   Troponin I <0.01       EMERGENCY DEPARTMENT COURSE:  Orders Placed This Encounter   Procedures   . MRI Brain WO Contrast   . MRI Angiogram Head WO Contrast   . MRI Angiogram Neck WO Contrast   . Comprehensive metabolic panel   . Lipid panel (Fasting)   . CBC   . APTT   . Protime-INR   . Hemoglobin A1c   . Troponin I   . Hemolysis index   . GFR   . Diet regular   . Vital signs with pulse ox (Q4H)   . Up as tolerated   . Place sequential compression device   . Maintain sequential compression device   . Notify physician (specify)   . Document NIH Stroke Score   .  Nursing swallow assessment   . Neuro checks   . Arrange for a sleep study   . Nasal Cannula Low-Flow (5 LPM or less)   . Daily Blood Pressure Goals   . Education: Stroke   . Telemetry 24 Hour Protocol   . Reason for no statin therapy   . Full Code   . PT EVALUATE AND TREAT   . Saline lock IV   . Place  for Observation Services       ASSESSMENT & PLAN     [name] is a 61 y.o. female admitted under OBSERVATION with dizziness and headache.  I started evaluating the patient at 8pm on Oct 04, 2016.    1. Dizziness and headache  Stroke Protocol ordered. Ct Scan Negative. MRI pending. Will consult Neuro in the morning for evaluation. Meclizine ordered for dizziness.      2. Hypertension  Takes Hyzaar and HCTZ at home. Will hold during workup and restart as needed.    3. Chronic Pain  Will continue home regimen.    4. Anxiety- Continue taking bedtime dose of Xanax.  5. Nutrition  Regular    Safety Checklist  DVT prophylaxis:  CHEST guideline (See page [id number]) Chemical and Mechanical   Foley: Not present   IVs:  Peripheral IV   PT/OT: Ordered   Daily CBC & or Chem ordered:  SHM/ABIM guidelines (see #5) Yes     Anticipated medical stability for discharge: January, 18 - Afternoon    Service status/Reason for ongoing hospitalization: Observation: Due to Stroke workup  Anticipated Discharge Needs: None    Signed,    Ernestine Conrad

## 2016-10-04 NOTE — ED Notes (Signed)
Dr. Quintin Alto informed of 2 attempts at IV. Patient states she is difficult stick & last had an IV inserted in foot.

## 2016-10-05 ENCOUNTER — Observation Stay: Payer: No Typology Code available for payment source

## 2016-10-05 ENCOUNTER — Other Ambulatory Visit: Payer: Self-pay

## 2016-10-05 LAB — HEMOGLOBIN A1C
Average Estimated Glucose: 119.8 mg/dL
Hemoglobin A1C: 5.8 % (ref 4.6–5.9)

## 2016-10-05 MED ORDER — MECLIZINE HCL 25 MG PO TABS
25.0000 mg | ORAL_TABLET | Freq: Three times a day (TID) | ORAL | 0 refills | Status: DC | PRN
Start: 2016-10-05 — End: 2017-12-18
  Filled 2016-10-05: qty 30, 10d supply, fill #0

## 2016-10-05 MED ORDER — MECLIZINE HCL 12.5 MG PO TABS
25.0000 mg | ORAL_TABLET | Freq: Three times a day (TID) | ORAL | Status: DC
Start: 2016-10-05 — End: 2016-10-05
  Administered 2016-10-05: 25 mg via ORAL
  Filled 2016-10-05: qty 2

## 2016-10-05 NOTE — PT Eval Note (Signed)
[facility]  Physical Therapy Evaluation    Patient: [name] MRN: [id number]   Unit: [facility]    Bed: V409/W119-14    Medicare ID # - Patient is not covered by Medicare.       SUMMARY:  Pt is 1 day post-admit for Vertigo. Prior to admit pt was independent and active, works full time. During this assessment pt needed supervision for gait with rolling walker -- needed for stability.      Pt is safe to return home with family assistance and walker for gait stabilization. She will benefit from follow up with out-pt PT Vestibular rehab services.             Recommended mode of transportation at discharge:  car    Assessment:   Examination - Assessment: Decreased endurance/activity tolerance;Decreased functional mobility, resulting in difficulty with ADLs.   Standardized tests and exams incorporated into evaluation include AMPAC mobility, balance, cognition/orientation, ROM  and Strength.    Co-morbidities/Patient factors affecting plan of care -  There are a few comorbidities or other factors that affect plan of care and require modification of task including: has stairs to manage and work demands.    Clinical factors affecting plan of care -  Pt demonstrates a evolving clinical presentation due to dizziness.     Prognosis: Good;With continued PT status post acute discharge    PMP - Progressive Mobility Protocol   PMP Activity: Step 7 - Walks out of Room  Distance Walked (ft) (Step 6,7): 150 Feet    Interdisciplinary Communication:   Pt left up in bed with alarm activated.  Updated white communication board in room with patient's current mobility status  Communicated with RN via direct contact regarding patient's mobility status.      Plan:   Recommendation  Discharge Recommendation: Home with outpatient PT (vestibular rehab)  DME Recommended for Discharge: Front wheel walker  PT - Next Visit Recommendation: 10/06/16  PT Frequency: 4-5x/wk    Patient Goal: home     Risks/Benefits/POC Discussed  with Pt/Family: With patient       Treatment/Interventions: Gait training;Stair training;Functional transfer training         Goals  Goal Formulation: With patient  Time for Goal Acheivement: 5 visits  Pt Will Ambulate: > 200 feet;independent (with or w/o device)  Pt Will Go Up / Down Stairs: 3-5 stairs;independent;With rail             Education:   Educated patient to role of physical therapy, plan of care, transfer training techniques, falls prevention, gait training techniques with RW, home safety, next appropriate level of care.      Patient demonstrated good understanding.      Discussed goals of therapy with patient, in agreement with plan.        Evaluation:   Consult received for [name] for PT evaluation and treatment.  Chart reviewed.  Patient's medical condition is appropriate for Physical Therapy intervention at this time.     Medical Diagnosis: Vertigo [R42]    Therapy Diagnosis: difficulty walking, generalized muscle weakness    Precautions  Weight Bearing Status: no restrictions  Other Precautions: falls    History of Present Illness: [name] is a 61 y.o. female admitted on 10/04/2016 with  acute onset of vertigo a/w headache and nausea. CT and EKG unremarkable      Patient Active Problem List   Diagnosis   . Neck pain   . Low  back pain   . Essential hypertension   . Primary osteoarthritis of both knees   . Anxiety   . Left knee DJD   . Chronic narcotic use - pain contract 02/04/2016   . Dizziness     Past Medical History:   Diagnosis Date   . Abnormal vision     glasses   . Anxiety     uses xanax @ bedtime   . Arthritis     bilateral knees   . Carpal tunnel syndrome on both sides     stiffness noted    . Difficulty walking     limps   . Eczema     has prescription cream   . Hypertensive disorder     stable meds   . Lower back pain     sciatica radiates down LLE: Pain range= 0-10   . Neck pain     related to lower back but significant at this time    . Neuropathy     numbness  upon awakening from low back radiating down LLE .Marland Kitchen. diminishes after awhile after moving around   . 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      Past Surgical History:   Procedure Laterality Date   . ARTHROPLASTY, KNEE, TOTAL Left 12/14/2015    Procedure: ARTHROPLASTY, KNEE, TOTAL;  Surgeon: Cynda Familia, MD;  Location: [place] [facility];  Service: Orthopedics;  Laterality: Left;  LEFT TOTAL KNEE ARTHROPLASTY   . COLONOSCOPY  2016    X1 polyp   . GANGLION CYST EXCISION Right 2007   . INDUCED ABORTION     . KNEE ARTHROSCOPY Right 09/24/2014   . KNEE ARTHROSCOPY Left 03/2015   . VAGINAL DELIVERY      X2 spinals ( 4natural deliveries)           X-Rays/Tests/Labs:    Lab Results   Component Value Date    WBC 10.13 10/04/2016    HGB 14.8 10/04/2016    HCT 45.5 10/04/2016    MCV 91.7 10/04/2016    PLT 307 10/04/2016       Ct Head Wo Contrast    Result Date: 10/04/2016   No acute process. Trilby Drummer, MD 10/04/2016 3:37 PM    Mri Angiogram Head Wo Contrast    Result Date: 10/05/2016   1.  Negative intracranial MR angiogram.     Einar Pheasant, MD 10/05/2016 1:14 PM    Mri Angiogram Neck Wo Contrast    Result Date: 10/05/2016   1.  Negative noncontrast neck MR angiogram.   Einar Pheasant, MD 10/05/2016 1:13 PM    Mri Brain Wo Contrast    Result Date: 10/05/2016   Age-related changes of the brain as noted above.      Einar Pheasant, MD 10/05/2016 1:12 PM            Prior Level of Function  Prior level of function: Independent with ADLs;Ambulates independently  Baseline Activity Level: Community ambulation;Household ambulation  Driving: independent  Cooking: Yes  Employment: FT  DME Currently at Home:  (none)      Home Living Arrangements  Living Arrangements: Children;Parent  Type of Home: House  Home Layout: Multi-level  DME Currently at Home:  (none)        Subjective: Patient is agreeable to participation in the therapy session. Nursing clears patient for therapy.   Pain Assessment  Pain Assessment: No/denies pain  Objective:  Patient is in bed with telemetry in place.    Observation of patient/vitals     BP 93/63   Pulse (!) 103   Temp 99.4 F (37.4 C) (Oral)   Resp 17   Ht 1.626 m (5\' 4" )   Wt 83 kg (183 lb)   SpO2 95%   BMI 31.41 kg/m       Inspection/Posture  Inspection/Posture: obese  Cognition/Neuro Status  Arousal/Alertness: Appropriate responses to stimuli  Attention Span: Appears intact  Orientation Level: Oriented X4  Memory: Appears intact  Following Commands: Follows all commands and directions without difficulty  Safety Awareness: minimal verbal instruction  Insights: Fully aware of deficits  Behavior: calm;cooperative        Musculoskeletal Examination  Gross ROM  Right Lower Extremity ROM: within functional limits  Left Lower Extremity ROM: within functional limits                                                          Gross Strength  Right Lower Extremity Strength: 5/5  Left Lower Extremity Strength: 5/5                        Functional Mobility  Rolling: Independent  Supine to Sit: Independent  Scooting to EOB: Independent  Sit to Supine: Independent  Sit to Stand: Independent  Stand to Sit: Independent      Transfers  Bed to Chair: Supervision  Chair to Bed: Supervision  Balance  Sitting - Static:  (independent)  Sitting - Dynamic:  (independent)  Standing - Static:  (supervision)  Standing - Dynamic:  (supervision with RW)    AM-PAC Basic Mobility  Turning Over in Bed: None  Sitting Down On/Standing From Armchair: None  Lying on Back to Sitting on Side of Bed: None  Assist Moving to/from Bed to Chair: A little  Assist to Walk in Hospital Room: A little  Assist to Climb 3-5 Steps with Railing: Total  PT Basic Mobility Raw Score: 19    Locomotion  Ambulation:  (150 ft with supervision with RW )  Pattern:  (ataxic,reaching ut for furniture when ambulating w/o device)  Stair Management:  (declined)       Participation and Endurance  Participation Effort: good  Endurance: Tolerates 10 - 20 min  exercise with multiple rests           Treatment:    PT eval; OOB and gait on level surface w/o device with CGA and then with RW with supervision; pt agreeable to need for walker; safety education.    Time Calculation  PT Received On: 10/05/16  Start Time: 1340  Stop Time: 1410  Time Calculation (min): 30 min          Signature:  [employer], PT  10/05/2016

## 2016-10-05 NOTE — Consults (Signed)
Service Date: 10/05/2016     Patient Type: V     CONSULTING PHYSICIAN: Shawnee Knapp MD     REFERRING PHYSICIAN: Vianne Bulls MD     PRIMARY CARE PHYSICIAN:   Dr. Marva Panda.       REASON FOR CONSULTATION:  Headaches, dizziness, facial numbness.     HISTORY OF PRESENT ILLNESS:  The patient is a 61 year old African American woman with history of  hypertension, osteoarthritis, chronic pain, who started feeling dizzy about  2 days ago.  The patient also reports headache.  She denies any confusion,  slurred speech, neck stiffness, fever, chills.  The patient this morning  noticed that her face was numb.  She presented to  the ED yesterday.  CT  scan:  Negative.  EKG:  Normal.     The patient reports the dizziness is a combination of lightheadedness and  spinning.  The patient reports she had dizzy spell a long time ago, it was  diagnosed as vertigo.  She does not recall the workup.  She denies deafness  or tinnitus, no ear infections.  No head or neck trauma, no chiropractic  manipulation.  The patient felt nauseous, no vomiting.  No double vision,  slurred speech, confusion, focal weakness or numbness.     PAST MEDICAL HISTORY:  Hypertension, osteoarthritis, chronic pain.     PAST SURGICAL HISTORY:  Knee arthroscopy, ganglion cyst excision.     FAMILY HISTORY:  Positive for hypertension and stroke in mother.  Father has hypertension.     SOCIAL HISTORY:  Chronic smoker, denies alcohol use.       ADMISSION MEDICATION LIST:  alprazolam, gabapentin, Norco, losartan, hydrochlorothiazide, tramadol.     ALLERGIES:  LATEX.     REVIEW OF SYSTEMS:  A 10-point review of systems is negative other than as covered above.  No  fever, chills.  No chest pain, palpitations.  No cough or hemoptysis.  No  abdominal pain, diarrhea.  No dysuria, hematuria.     PHYSICAL EXAMINATION:  VITAL SIGNS:  Temperature 97.9, pulse 106, respirations 18, blood pressure  102/60, oxygen saturation 94%.  GENERAL:  Pleasant, cooperative, middle-aged  African American woman lying  in bed in no acute distress.  HEAD:  No external signs of trauma.  NECK:  Supple.  ENT:  Unremarkable.  SKIN:  Warm, dry.  EXTREMITIES:  Without clubbing, cyanosis or edema.  Peripheral pulses  intact.  NEUROLOGIC:  Shows patient to be awake, alert, oriented to time, place,  person and situation.  Speech is clear.  Language is normal.  Extraocular  movements full without nystagmus.  Pupils equal, round, reactive.  Normal  fields and fundi.  Face is symmetric.  Tongue is midline.  Good strength,  symmetric reflexes, intact sensations including touch, and vibration.  HEART:  Sounds normal, no murmurs, no carotid bruits.  Cerebellar exam at  the bedside is negative.     LABORATORY DATA:  Review of laboratory and imaging data shows unremarkable CBC, hemoglobin  14.8, hematocrit 45.5, white cell count 10.13, platelet count 307,000.   Sodium 140, potassium 3.6, chloride 102, bicarbonate 31, BUN 10, creatinine  0.9, random glucose 120.  CT scan of the brain negative.  The patient had a  brain MRI along with MR angiogram of the head and neck.  Those studies are  negative for any acute process.  Urine drug screen from August 01, 2016,  positive for benzodiazepines, hydrocodone, hydromorphone.     IMPRESSION:  A 61 year old  with chronic pain, osteoarthritis, hypertension.  No definite  history of migraines.  Prior remote history of vertigo, felt to be   problem, the records regarding that are not available.  She comes into the  hospital with a 2 day history of dizziness, combination of lightheadedness  and spinning.  No deafness, tinnitus.  No focal weakness or numbness.  No  double vision, no slurred speech.  Neurological examination is nonfocal.   The patient also complains of facial numbness.  Exact etiology of these  symptoms is unclear.  Probable viral syndrome, although numbness would not  be felt to be typical.  The patient has been on strong narcotics and  benzodiazepines which can  impact balance.  Symptoms related to medication  use is also a consideration in the differential diagnosis.  Workup is  negative for any stroke, tumor, aneurysm or acute structural  neuropathology.     RECOMMENDATIONS:  Dizziness and fall precautions, p.r.n. meclizine for symptomatic treatment  of dizziness.  Continue general medical and supportive care.  Balance use  of pain medications and anxiolytics such as benzodiazepines with careful  weighing of the benefits and risks as well as side effects.     Diagnosis, recommendations and plan of care discussed with the patient.   Care coordinated with the nursing staff and attending/referring physician.   From the neurological point of view, patient is stable, can be discharged  with outpatient neurology and primary care followup.           D:  10/05/2016 20:19 PM by Dr. Shawnee Knapp, MD ([id number])  T:  10/05/2016 21:16 PM by [place]      (Conf: [id number]) (Doc ID: [id number])

## 2016-10-05 NOTE — Progress Notes (Signed)
Introduced [employer] and self to patient    Discussed POC and possible discharge needs with patient    POC:  Discharge Plan A:Home- daughter will pick up      Barriers to discharge: None    Verified demographics and PCP on facesheet Yes      Met with patient at bedside to discuss discharge planning needs.  Patient lives with father, daughter, and son in private residence.  Patient was independent with ADLs prior to discharge.  Patient has walker and cane in the home from prior knee replacement.    No discharge needs identified.      Reece Agar, RN Case Manager.

## 2016-10-05 NOTE — UM Notes (Signed)
PATIENT NAME: [name],[name]   DOB: 02-12-1956   PMH:   Abnormal vision   Anxiety   Arthritis   Carpal tunnel syndrome on both sides   Difficulty walking   Eczema   Hypertensive disorder   Lower back pain   Neck pain   Neuropathy   Vein disorder      ADMITTED ON: 10/04/2016 2042 for Observation   ADMISSION DIAGNOSIS: Vertigo   NOTES TO REVIEWER:    This clinical review is based on/compiled from documentation provided by the treatment team within the patient's medical record.   UTILIZATION REVIEW CONTACT: Cherly Anderson. Barnie Alderman, RN, BSN  Clinical Case Manager  - Utilization Review  [facility]    [address]    [place], [place] [address]  NPI: [id number]  Tax ID: [id number]  Phone: [phone]  Fax: [phone]    Please use fax number [phone] to provide authorization for hospital services or to request additional information.        10/04/2016   Pt is a 61 y.o. female who arrived to the hospital by direct admission due to sudden onset dizziness that worsens with movement.    Arrival Vitals [10/04/16 1950]      BP 130/78      Heart Rate 91      Resp Rate 17      Temp 98.4 F (36.9 C)      Temp Source Oral      SpO2 98 %      Weight 83 kg (183 lb)      Height 1.626 m (5\' 4" )      Pain Score 7      LABS:    10/04/2016 21:38   WBC 10.13   Hemoglobin 14.8   Hematocrit 45.5   Platelet Count 307   Glucose 120 (H)   BUN 10   Creatinine 0.9   Sodium 140   Potassium 3.6   Chloride 102   Carbon Dioxide, Whole Blood 31 (H)   Calcium 10.6 (H)   Anion Gap 7.0   EGFR >60.0   AST Whole Blood 31   ALT, Whole Blood 48   Alkaline Phosphatase 98   Albumin 3.8   Protein, Total 7.8   Globulin 4.0 (H)   Albumin/Globulin Ratio 1.0   Bilirubin, Total 0.6   Cholesterol 204 (H)   HDL 41   Calculated LDL 140 (H)   Triglycerides 116   CHOL/HDL Ratio 5.0   VLDL Cholesterol Cal 23   Troponin I <0.01   PT 13.7   PT INR 1.0   PT Anticoag. Given Within 48 hrs. None   PTT 33      10/04/2016 15:20   Urine Type Clean Catch    Color, UA Yellow   Clarity, UA Clear   Specific Gravity, UA 1.010   Urine pH 7.0   Leukocyte Esterase, UA Negative   Nitrite, UA Negative   Protein, UR Negative   Glucose, UA Negative   Ketones UA Negative   Urobilinogen, UA 0.2   Bilirubin, UA Negative   Blood, UA Trace (A)   RBC UA 0 -2   WBC, UA 0 - 5   Squamous Epithelial Cells, Urine 0 - 5      MEDS:  Xanax 0.5mg  QHS     Norco 1 Tab BID PRN x 1   Antivert 25mg  TID PRN x 1   Zofran 4mg  Q6H PRN x 1   Ultram 50mg  Q12H  PRN x 1  IMAGING:  None     10/05/2016    Continues on Telemetry Unit, Q4H VS.     Temp:  [97 F (36.1 C)-99.4 F (37.4 C)] 97.9 F (36.6 C)  Heart Rate:  [90-115] 106  Resp Rate:  [16-18] 18  BP: (91-132)/(60-78) 102/60 LABS:   None   MEDS:  Xanax 0.5mg  QHS   Lovenox 40mg  SQ QD   Antivert 25mg  TID     Norco 1 Tab BID PRN x 1   Antivert 25mg  TID PRN x 1   Ultram 50mg  Q12H PRN x 1  IMAGING:  MRA Head/Neck  Negative intracranial MR angiogram. Negative noncontrast neck MR angiogram.       MRI Brain   Age-related changes of the brain as noted above.

## 2016-10-05 NOTE — Plan of Care (Signed)
Problem: Safety  Goal: Patient will be free from injury during hospitalization  Outcome: Progressing   10/05/16 1455   Goal/Interventions addressed this shift   Patient will be free from injury during hospitalization  Assess patient's risk for falls and implement fall prevention plan of care per policy;Use appropriate transfer methods;Provide and maintain safe environment;Ensure appropriate safety devices are available at the bedside;Include patient/ family/ care giver in decisions related to safety;Hourly rounding;Assess for patients risk for elopement and implement Elopement Risk Plan per policy;Provide alternative method of communication if needed (communication boards, writing)       Comments: A&Ox4, VSS, tele shows SR. MRI was negative, stroke is ruled out. Patient reports dizziness improved, new tingling present in face and extremities starting this AM. Administering meclizine Q8 hours and encouraging fluid intake. Patient may d/c home pending neuro assessment. All needs met.

## 2016-10-05 NOTE — Plan of Care (Signed)
Problem: Neurological Deficit  Goal: Neurological status is stable or improving  Outcome: Progressing   10/05/16 0238   Goal/Interventions addressed this shift   Neurological status is stable or improving Monitor/assess/document neurological assessment (Stroke: every 4 hours);Monitor/assess NIH Stroke Scale;Re-assess NIH Stroke Scale for any change in status;Observe for seizure activity and initiate seizure precautions if indicated;Perform CAM Assessment       Comments: Patient Admitted For: vertigo, r/o stroke  Current Assessed Risk Factors:   Non-modifiable (age, gender, race, family history, previous stroke or TIA): age, gender, race   Modifiable (smoking, obesity, alcohol, illegal drug use): obesity, smoking   Modifiable with Doctor's help (HTN, DM, A-fib, high chol, sleep apnea):  HTN, HLD   Doctor prescribed: lovenox  Patient/Family Stroke Prevention Goals: (list stated lifestyle changes/goals): diet modification, smoking cessation  Education Materials Given To: pt  Response To Teaching As Evidenced By: (summarize patient/family response): S/S of stroke, when to call 911  Goals For Today (imaging, aspiration precautions, q 4hr neuro checks, education): imaging, IV access, asp precautions, q4 neuro checks      Sleep Apnea:   Educated patient/family about obstructive sleep apnea (OSA), prevalence and that evidence shows sleep apnea increases the risk of ischemic stroke: yes  Other individual risk factors that contribute to OSA: HTN, obesity  Educated patient/family to discuss sleep study with PCP (needs prescription): will discuss with PMD at [facility]      VSS, pt c/o HA and dizziness that is worse when lying flat. Pt unable to lay flat long enough for MRI. Also unable to obtain IV access, 4 techs tried with vein finder. Will reattempt in AM.

## 2016-10-05 NOTE — Discharge Summary (Signed)
DISCHARGE NOTE    Date Time: 10/05/16 4:05 PM  Patient Name: [name]  Attending Physician: Vianne Bulls, MD    Date of Admission:   10/04/2016    Date of Discharge:   10/05/2016    Reason for Admission:   Vertigo [R42]    Problems:   Lists the present on admission hospital problems  Present on Admission:  . Dizziness      Hospital Problems:  Active Problems:    Dizziness      Problem Lists:  Patient Active Problem List   Diagnosis   . Neck pain   . Low back pain   . Essential hypertension   . Primary osteoarthritis of both knees   . Anxiety   . Left knee DJD   . Chronic narcotic use - pain contract 02/04/2016   . Dizziness           Discharge Dx:   1.  Vertigo     Consultations:     Treatment Team: Attending Provider: Vianne Bulls, MD; Registered Nurse: Doyce Loose, RN; Technician: Sherran Needs; Physical Therapist: Faylene Million, PT; Consulting Physician: Shawnee Knapp, MD      Procedures performed:   Ct Head Wo Contrast    Result Date: 10/04/2016   No acute process. Trilby Drummer, MD 10/04/2016 3:37 PM    Mri Angiogram Head Wo Contrast    Result Date: 10/05/2016   1.  Negative intracranial MR angiogram.     Einar Pheasant, MD 10/05/2016 1:14 PM    Mri Angiogram Neck Wo Contrast    Result Date: 10/05/2016   1.  Negative noncontrast neck MR angiogram.   Einar Pheasant, MD 10/05/2016 1:13 PM    Mri Brain Wo Contrast    Result Date: 10/05/2016   Age-related changes of the brain as noted above.      Einar Pheasant, MD 10/05/2016 1:12 PM        Subjective:   Pt feels better no chest pain or Sob. No other complaints      Review of Systems:       A comprehensive review of systems was: Negative except as above            Physical Exam:     Patient Vitals for the past 24 hrs:   BP Temp Temp src Pulse Resp SpO2 Height Weight   10/05/16 1121 93/63 99.4 F (37.4 C) Oral (!) 103 17 95 % - -   10/05/16 1047 132/64 - - - - - - -   10/05/16 0750 103/67 98.2 F (36.8 C) Oral 93 16 96 % - -   10/05/16 0340 91/62  97.5 F (36.4 C) Oral 98 18 94 % - -   10/04/16 2348 100/69 98.1 F (36.7 C) Oral 99 18 100 % - -   10/04/16 1950 130/78 98.4 F (36.9 C) Oral 91 17 98 % 1.626 m (5\' 4" ) 83 kg (183 lb)       Intake and Output Summary (Last 24 hours) at Date Time  No intake or output data in the 24 hours ending 10/05/16 1605      GENERAL: Patient is in no acute distress, resting comfortably  HEENT:Head normocephalic, atraumatic,pupil equal,reacting to light  No scleral icterus or conjunctival pallor, moist mucous membranes   NECK: No jugular venous distention or thyromegaly, normal carotid upstrokes without bruits   CARDIAC: Normal apical impulse, regular rate and  rhythm, with  normal S1 and S2, and no murmurs, rubs, or gallops   CHEST: Bilaterally symmetrical, equal in expansion. Clear to auscultation bilaterally, normal respiratory effort   ABDOMEN: Soft ,nontender, No distention,, masses, or hepatosplenomegaly , good bowel sounds   EXTREMITIES: No clubbing, cyanosis, or edema, 2+ DP, PT, and femoral pulses bilaterally without bruits   SKIN: No rash or jaundice   NEUROLOGIC: Alert and oriented to time, place and person, normal mood and affect. No focal motor deficit present except RLE numbness which is not new per patient    MUSCULOSKELETAL: Normal muscle strength and tone.      Labs:       Recent Labs  Lab 10/04/16  2138   WBC 10.13   Hgb 14.8   Hematocrit 45.5   MCV 91.7   MCHC 32.5   RDW 13   MPV 11.0   Platelets 307           Recent Labs  Lab 10/04/16  2138   Sodium 140   Potassium 3.6   Chloride 102   CO2 31*   BUN 10   Creatinine 0.9   Glucose 120*   Calcium 10.6*       Recent Labs  Lab 10/04/16  2138   Bilirubin, Total 0.6   Protein, Total 7.8   Albumin 3.8   ALT 48   AST (SGOT) 31           Recent Labs  Lab 10/04/16  2138   PT INR 1.0                   Rads:     Radiology Results (24 Hour)     Procedure Component Value Units Date/Time    MRI Angiogram Head WO Contrast [[id number]] Collected:  10/05/16 1313    Order  Status:  Completed Updated:  10/05/16 1318    Narrative:       HISTORY: 61 years old, DIZZINESS,          TECHNIQUE: Routine 3D TOF intracranial MRA, without contrast.  COMPARISON: None.  FINDINGS:   There is no evidence of aneurysm.  There is no hemodynamically significant stenosis.          Impression:          1.  Negative intracranial MR angiogram.           Einar Pheasant, MD   10/05/2016 1:14 PM    MRI Angiogram Neck WO Contrast [[id number]] Collected:  10/05/16 1312    Order Status:  Completed Updated:  10/05/16 1317    Narrative:       HISTORY: 61 years old, STROKE,     dizziness       TECHNIQUE:     2D and 3 D TOF extracranial MR angiogram without  contrast. Any proximal internal carotid artery stenosis are made  referencing the distal internal carotid artery.  COMPARISON: None.  FINDINGS:   Right carotid: The visualized portion of right common and internal  carotid arteries are without hemodynamically significant stenosis.    Left carotid: The visualized portion of left common and internal carotid  arteries are without hemodynamically significant stenosis.    Vertebral: The visualized portion of vertebral arteries are without  hemodynamically significant stenosis.        Impression:          1.  Negative noncontrast neck MR angiogram.         Einar Pheasant, MD   10/05/2016  1:13 PM    MRI Brain WO Contrast [[id number]] Collected:  10/05/16 1309    Order Status:  Completed Updated:  10/05/16 1316    Narrative:       HISTORY: 61 years old, HEADACHE, POSITIONAL, dizziness          TECHNIQUE: Routine MRI brain without  contrast.   COMPARISON: CT scan dated 10/04/2016.  FINDINGS:   There is scattered mild supratentorial age related changes of deep white  matter.   The study shows age-appropriate     atrophy.    The diffusion weighted images are normal without acute ischemia.  There is no evidence of prior microhemorrhage with unremarkable  susceptibility weighted images.    There is no mass, acute hemorrhage, or  extra-axial fluid collection.   The ventricles and the basal cisterns are normal without mass effect.  The intracranial vessels are grossly normal.   The sinuses and orbits are grossly normal.         Impression:          Age-related changes of the brain as noted above.            Einar Pheasant, MD   10/05/2016 1:12 PM                  Hospital Course:   Pt is a 61 yo AAF with hx of HTN, , obesity, arthritis, admitted with vertigo.  Pt was monitored on tele overnight with frequent neuro checks and she remained stable.  She was started on Meclizine and encouraged to maintain ample oral hydration since all attempts at IV access were unsuccessful overnight.  She udnerwent MRI/MRA brain, MRI neck which were all unremarkable.  She was evaluated by PT and given gait instability recommended a walker with outpatient PT.   She will be seen by neurology, Dr. Adline Mango prior to [facility].  Pt was noted to have a borderline low BP despite not giving her BP meds and so those were held.  Pt is feeling better with that. She is advised to follow up closely with her PCP and check her BP at home and not take her Losartan/HCTZ if her SBP is lower than 120/70        As pt clinically stable will [place] home with Office FU    Discharge Medications:        Medication List      START taking these medications    meclizine 25 MG tablet  Commonly known as:  ANTIVERT  Take 1 tablet (25 mg total) by mouth 3 (three) times daily as needed for Dizziness.        CONTINUE taking these medications    ALPRAZolam 0.5 MG tablet  Commonly known as:  XANAX  TAKE 1 TABLET BY MOUTH NIGHTLY AS NEEDED     betamethasone dipropionate 0.05 % cream  Commonly known as:  DIPROLENE  Apply topically 2 (two) times daily as needed.     gabapentin 100 MG capsule  Commonly known as:  NEURONTIN  Take 1 capsule (100 mg total) by mouth 3 (three) times daily.     HYDROcodone-acetaminophen 5-325 MG per tablet  Commonly known as:  NORCO  Take 1 tablet by mouth 2 (two) times daily as needed for  Pain.Do Not fill before 05/05/2016.     losartan-hydrochlorothiazide 100-12.5 MG per tablet  Commonly known as:  HYZAAR  TAKE 1 TABLET BY MOUTH DAILY     naloxone 4 MG/0.1ML Liqd nasal spray  Commonly known as:  NARCAN  1 spray intranasally. If pt does not respond or relapses into respiratory depression call 911. Give additional doses every 2-3 min.     traMADol 50 MG tablet  Commonly known as:  ULTRAM  TAKE 1 TABLET BY MOUTH EVERY 12 HOURS AS NEEDED FOR PAIN           Where to Get Your Medications      These medications were sent to [facility]  [address], [place] [address]    Hours:  9AM to 7PM Monday - Friday 10AM to 4PM Saturday Phone:  [phone]    meclizine 25 MG tablet         Please see med rec alsoif any medicine changed after this note  Discharge Instructions:   Diet Regular  Activity as Tolerated      Patient was instructed to follow up with PCP within 3 days of [place]      Minutes spent coordinating discharge and reviewing discharge plan 30 minutes        Signed by: Vianne Bulls, MD

## 2016-10-06 ENCOUNTER — Telehealth: Payer: Self-pay

## 2016-10-06 ENCOUNTER — Ambulatory Visit: Payer: No Typology Code available for payment source | Attending: Physician Assistant

## 2016-10-06 DIAGNOSIS — R600 Localized edema: Secondary | ICD-10-CM | POA: Insufficient documentation

## 2016-10-06 DIAGNOSIS — M47816 Spondylosis without myelopathy or radiculopathy, lumbar region: Secondary | ICD-10-CM | POA: Insufficient documentation

## 2016-10-06 DIAGNOSIS — M791 Myalgia: Secondary | ICD-10-CM | POA: Insufficient documentation

## 2016-10-06 DIAGNOSIS — M545 Low back pain: Secondary | ICD-10-CM

## 2016-10-06 DIAGNOSIS — M7062 Trochanteric bursitis, left hip: Secondary | ICD-10-CM | POA: Insufficient documentation

## 2016-10-06 DIAGNOSIS — M7918 Myalgia, other site: Secondary | ICD-10-CM

## 2016-10-06 DIAGNOSIS — G8929 Other chronic pain: Secondary | ICD-10-CM

## 2016-10-06 DIAGNOSIS — M25552 Pain in left hip: Secondary | ICD-10-CM

## 2016-10-06 NOTE — Telephone Encounter (Signed)
Post Acute Discharge Call    Hospital /  ED Discharge Date: [facility] [place] / 10/05/16  Primary Discharge Dx: Vertigo   Secondary Dx:   Hospital / ED Discharge Appt with PCP: NO       Placed call to patient to follow up on recent hospital discharge, assess current status, address questions/concerns regarding discharge instructions / medications and encourage patient to schedule Hospital Discharge follow up appt with PCP; No Answer; LVMM with contact information and request for return call.  Will continue to follow.

## 2016-10-09 ENCOUNTER — Encounter (INDEPENDENT_AMBULATORY_CARE_PROVIDER_SITE_OTHER): Payer: Self-pay | Admitting: Internal Medicine

## 2016-10-09 ENCOUNTER — Ambulatory Visit (INDEPENDENT_AMBULATORY_CARE_PROVIDER_SITE_OTHER): Payer: No Typology Code available for payment source | Admitting: Internal Medicine

## 2016-10-09 ENCOUNTER — Ambulatory Visit (INDEPENDENT_AMBULATORY_CARE_PROVIDER_SITE_OTHER): Payer: Self-pay | Admitting: Internal Medicine

## 2016-10-09 ENCOUNTER — Telehealth: Payer: Self-pay

## 2016-10-09 VITALS — BP 142/88 | HR 76 | Temp 98.0°F | Wt 191.0 lb

## 2016-10-09 DIAGNOSIS — M170 Bilateral primary osteoarthritis of knee: Secondary | ICD-10-CM

## 2016-10-09 DIAGNOSIS — R42 Dizziness and giddiness: Secondary | ICD-10-CM

## 2016-10-09 DIAGNOSIS — I10 Essential (primary) hypertension: Secondary | ICD-10-CM

## 2016-10-09 DIAGNOSIS — M5440 Lumbago with sciatica, unspecified side: Secondary | ICD-10-CM

## 2016-10-09 DIAGNOSIS — G8929 Other chronic pain: Secondary | ICD-10-CM

## 2016-10-09 DIAGNOSIS — F1190 Opioid use, unspecified, uncomplicated: Secondary | ICD-10-CM

## 2016-10-09 NOTE — Progress Notes (Signed)
Subjective:       Patient ID: [name] is a 61 y.o. female with MMP was hospitalized 1/1-/18 for vertigo.    HPI Vertigo - she was admiited with vertigo. She had MRangiogram of head and neck. The only finding was age related changes of brain. Antihypertensive was held during hospitalization.    Symptoms have resloved. She has resumed Hyzaar.  LPB - she has had longstanding LBP which has persisted despite PT, epidural steroid injections.  She has low back pain with radiation to both legs; R>L. No sphincter dysfunction. She uses Tramadol BID and occasional Norco for relief.  MRI of L/S spine -  overall mild degenerative changes of the lumbar spine without significant spinal canal stenosis. There is mild neural foraminal narrowing at L3-L4 due to disc bulging, endplate osteophytic spurring, grade 1 anterolisthesis and facet hypertrophy. Recently she has had increased LBP with radiation of pain to left posterio thigh. No associated fever, chills, sphincter dysfunction, weakness.   Hypertension - she has been monitoring BP. Home systolic <130. She is compliant with medication.   Anxiety -  She takes Xanax nightly.  DJD of knees - she had left TKA 12/14/2015. She has completed PT. She walks with cane. She anticipated TKA of contralateral knee in near future.    The following portions of the patient's history were reviewed and updated as appropriate: allergies, current medications and problem list.    Review of Systems        Objective:    Physical Exam  BP 142/88   Pulse 76   Temp 98 F (36.7 C) (Oral)   Wt 86.6 kg (191 lb)   BMI 32.79 kg/m   Constitutional:  Alert, oriented X 3. NAD  Head    Atraumatic, normocephalic  Eye:   Conjunctival - pink EOMI without nystagmus.  EENT   Oropharynx is clear, good dentition, TM's - OK  Chest:   Lungs clear to auscultation, good breath sounds  Cardiac:  Normal S1&S2 without murmurs. No edema.  Skin:   Warm, dry, no rashes in visible areas  Psych:   Normal affect,  thought content    Lab Results   Component Value Date    WBC 10.13 10/04/2016    HGB 14.8 10/04/2016    HCT 45.5 10/04/2016    PLT 307 10/04/2016    CHOL 204 (H) 10/04/2016    TRIG 116 10/04/2016    HDL 41 10/04/2016    LDL 140 (H) 10/04/2016    ALT 48 10/04/2016    AST 31 10/04/2016    NA 140 10/04/2016    K 3.6 10/04/2016    CL 102 10/04/2016    CREAT 0.9 10/04/2016    BUN 10 10/04/2016    CO2 31 (H) 10/04/2016    INR 1.0 10/04/2016    GLU 120 (H) 10/04/2016    HGBA1C 5.8 10/04/2016           Assessment:       1. Vertigo  Resolved   2. Essential hypertension     3. Chronic bilateral low back pain with sciatica, sciatica laterality unspecified     4. Chronic narcotic use - pain contract 02/04/2016     5. Primary osteoarthritis of both knees             Plan:      Procedures  Reviewed discharge summary and medications.   Continue current medications with routine laboratory monitoring, annual influenza vaccine, seat belts, avoidance of STDs,  regular dental visits,regular exercise,and healthy diet.

## 2016-10-09 NOTE — Patient Instructions (Signed)
bppv  Epley manuever

## 2016-10-09 NOTE — Progress Notes (Signed)
Have you seen any new specialists/physicians since you were last here?no      Limb alert protocol reviewed?  Yes    Limb questionnaire reviewed.   Patient denies any limb restrictions.

## 2016-10-09 NOTE — Telephone Encounter (Signed)
Post Acute Discharge Call    Type of Encounter (ER, InPt or Obs) : OBS    Facility: [facility]    Discharge Date: 10/05/16    Primary Discharge Dx: Vertigo    Prior ER Visits/Hospitalizations (past yr): TWO    Follow Up Appt with PCP/Specialist : 10/09/16       Outside care ordered: NO  Have you been contacted to arrange care?    Patient Questions:    What symptoms made you go to the ER or hospital?:  Pt. Was admitted to the hospital with vertigo.     How do you feel today after your recent hospital/ER visit?:  Pt. Stated she is doing better now after being discharged from the hospital. She is taking it slow but doing much better.     Do you have any new or worsening symptoms since being seen?      Pt. Does not have any new or worsening symptoms since being seen.    Do you have your discharge instructions?  Any questions about them?  Pt. Stated she does not have any questions in regards to her discharge instructions.     Do you have questions about any new medications or changes to your medications from the visit?   Pt. Stated she does not have any questions in regards to her medications at this time.     Do you have assistance at home and transportation to appointments?  Pt. Stated she has assistance at home and reliable transportation to appointments.       Advised to follow up as instructed.  Patient / family member verbalizes understanding and has no other questions or concerns.   Instructed to call back if symptoms change or worsen and/or go to the emergency room or call 911 for emergency symptoms.

## 2016-10-30 ENCOUNTER — Other Ambulatory Visit (INDEPENDENT_AMBULATORY_CARE_PROVIDER_SITE_OTHER): Payer: Self-pay | Admitting: Internal Medicine

## 2016-10-31 ENCOUNTER — Other Ambulatory Visit: Payer: Self-pay

## 2016-10-31 MED ORDER — HYDROCODONE-ACETAMINOPHEN 5-325 MG PO TABS
1.0000 | ORAL_TABLET | Freq: Two times a day (BID) | ORAL | 0 refills | Status: DC | PRN
  Filled 2016-10-31: fill #0

## 2016-10-31 MED ORDER — ALPRAZOLAM 0.5 MG PO TABS
ORAL_TABLET | ORAL | 0 refills | Status: DC
Start: 2016-10-31 — End: 2016-11-01
  Filled 2016-10-31: qty 90, 90d supply, fill #0

## 2016-10-31 MED ORDER — TRAMADOL HCL 50 MG PO TABS
ORAL_TABLET | ORAL | 0 refills | Status: DC
Start: 2016-10-31 — End: 2016-11-01
  Filled 2016-10-31: fill #0

## 2016-11-01 ENCOUNTER — Other Ambulatory Visit: Payer: Self-pay

## 2016-11-01 MED ORDER — HYDROCODONE-ACETAMINOPHEN 5-325 MG PO TABS: 1.0000 | ORAL_TABLET | Freq: Two times a day (BID) | ORAL | 0 refills | Status: DC | PRN

## 2016-11-01 MED ORDER — ALPRAZOLAM 0.5 MG PO TABS
ORAL_TABLET | ORAL | 0 refills | Status: DC
Start: 2016-11-01 — End: 2017-01-26

## 2016-11-01 MED ORDER — TRAMADOL HCL 50 MG PO TABS
ORAL_TABLET | ORAL | 0 refills | Status: DC
Start: 2016-11-01 — End: 2017-01-26

## 2016-11-01 NOTE — Telephone Encounter (Signed)
sent 

## 2016-11-01 NOTE — Telephone Encounter (Signed)
Can you please resend to [facility] on file. I have no idea how [facility] got selected. Thanks!

## 2016-12-04 ENCOUNTER — Other Ambulatory Visit (INDEPENDENT_AMBULATORY_CARE_PROVIDER_SITE_OTHER): Payer: Self-pay

## 2016-12-04 NOTE — Progress Notes (Deleted)
12/04/16 1221   Pt Outreach/Care Plan Metrics   Care Plan Progress Refused   Outreach Status for [employer] Spoke with Member/Care Giver

## 2016-12-04 NOTE — Progress Notes (Signed)
Nurse Navigator placed telephone call to patient to confirm patient's primary care physician and discuss care coordination. Patient answered call, explained role of care coordinator to patient. She confirms her primary care physician. Patient states she is well managed with her medical conditions and denies any care coordination assistance at this time. Patient is aware to outreach if her needs change or she/he would like to enroll in care coordination.

## 2016-12-04 NOTE — Progress Notes (Signed)
12/04/16 1221   Pt Outreach/Care Plan Metrics   Care Plan Progress Refused   Outreach Status for [employer] Spoke with Member/Care Giver

## 2016-12-25 ENCOUNTER — Other Ambulatory Visit: Payer: Self-pay | Admitting: Orthopaedic Surgery

## 2016-12-25 DIAGNOSIS — D485 Neoplasm of uncertain behavior of skin: Secondary | ICD-10-CM

## 2016-12-28 ENCOUNTER — Ambulatory Visit: Payer: No Typology Code available for payment source | Attending: Orthopaedic Surgery

## 2016-12-28 DIAGNOSIS — M65831 Other synovitis and tenosynovitis, right forearm: Secondary | ICD-10-CM | POA: Insufficient documentation

## 2016-12-28 DIAGNOSIS — D485 Neoplasm of uncertain behavior of skin: Secondary | ICD-10-CM

## 2017-01-22 ENCOUNTER — Other Ambulatory Visit (INDEPENDENT_AMBULATORY_CARE_PROVIDER_SITE_OTHER): Payer: Self-pay | Admitting: Internal Medicine

## 2017-01-22 NOTE — Telephone Encounter (Signed)
Name, strength, directions of requested refill(s):     traMADol (ULTRAM) 50 MG tablet     ALPRAZolam (XANAX) 0.5 MG tablet    naloxone (NARCAN) 4 MG/0.1ML Liquid nasal spray    HYDROcodone-acetaminophen (NORCO) 5-325 MG per tablet    Pharmacy to send refill to or patient to pick up rx from office (mark requested pharmacy in BOLD):      [facility]  [address]  [place] [address]  Phone: [phone] Fax: [phone]    [id number] [place], [place] - [address]  [address] [address] [place] [address]  Phone: [phone] Fax: [phone]         Please mark "X" next to the preferred call back number:    Mobile:   Telephone Information:   Mobile [phone]    x   Home: [phone]    Work: [phone]          Next visit: Visit date not found

## 2017-01-22 NOTE — Telephone Encounter (Signed)
Forwarded to covering nurse.

## 2017-01-25 NOTE — Telephone Encounter (Signed)
LMOM informing pt that RX's were not approved and she need to schedule an appt.

## 2017-01-26 ENCOUNTER — Ambulatory Visit (INDEPENDENT_AMBULATORY_CARE_PROVIDER_SITE_OTHER): Payer: No Typology Code available for payment source | Admitting: Internal Medicine

## 2017-01-26 VITALS — BP 123/78 | HR 98 | Temp 98.1°F | Wt 193.4 lb

## 2017-01-26 DIAGNOSIS — M5442 Lumbago with sciatica, left side: Secondary | ICD-10-CM

## 2017-01-26 DIAGNOSIS — R7301 Impaired fasting glucose: Secondary | ICD-10-CM

## 2017-01-26 DIAGNOSIS — F1190 Opioid use, unspecified, uncomplicated: Secondary | ICD-10-CM

## 2017-01-26 DIAGNOSIS — I10 Essential (primary) hypertension: Secondary | ICD-10-CM

## 2017-01-26 DIAGNOSIS — G8929 Other chronic pain: Secondary | ICD-10-CM

## 2017-01-26 MED ORDER — ALPRAZOLAM 0.5 MG PO TABS
ORAL_TABLET | ORAL | 1 refills | Status: DC
Start: 2017-01-26 — End: 2017-04-20

## 2017-01-26 MED ORDER — TRAMADOL HCL 50 MG PO TABS
ORAL_TABLET | ORAL | 0 refills | Status: DC
Start: 2017-01-26 — End: 2017-11-27

## 2017-01-26 NOTE — Progress Notes (Signed)
Have you seen any specialists/other providers since your last visit with us?    No    Arm preference verified?   Yes    The patient is due for shingles vaccine

## 2017-01-26 NOTE — Progress Notes (Signed)
Subjective:       Patient ID: [name] is a 61 y.o. female with MMP for folow up. She requests refill of narcotic analgesic for chronic back pain.    HPI  LPB - she has had longstanding LBP which has persisted despite PT, epidural steroid injections.  She has low back pain with radiation to both legs; R>L. No sphincter dysfunction. She uses Tramadol BID and occasional Norco for relief.  MRI of L/S spine -  overall mild degenerative changes of the lumbar spine without significant spinal canal stenosis. There is mild neural foraminal narrowing at L3-L4 due to disc bulging, endplate osteophytic spurring, grade 1 anterolisthesis and facet hypertrophy. Recently she has had increased LBP with radiation of pain to left posterio thigh. No associated fever, chills, sphincter dysfunction, weakness.   Hypertension - she has been monitoring BP. Home systolic <130. She is compliant with medication.   Anxiety -  She takes Xanax nightly.  DJD of knees - she had left TKA 12/14/2015. She has completed PT. She walks with cane. She anticipated TKA of contralateral knee in near future.    The following portions of the patient's history were reviewed and updated as appropriate: allergies, current medications and problem list.    Review of Systems        Objective:    Physical Exam  BP 123/78   Pulse 98   Temp 98.1 F (36.7 C) (Oral)   Wt 87.7 kg (193 lb 6.4 oz)   BMI 33.20 kg/m   Constitutional:  Alert, oriented X 3. NAD  Head    Atraumatic, normocephalic  Eye:   Conjunctival - pink  Chest:   Lungs clear to auscultation, good breath sounds  Cardiac:  Normal S1&S2 without murmurs. No edema.  Abdomen:  Soft, nontender, no palpable masses, liver or spleen  Skin:   Warm, dry, no rashes in visible areas  Psych:   Normal affect, thought content        Assessment:       1. Chronic narcotic use    2. Chronic left-sided low back pain with left-sided sciatica    3. Essential hypertension    4. IFG (impaired fasting glucose)             Plan:      Procedures  PMP reviewed.  Will not refill Norco, but will increase tramadol to TID #270.   Urine drug screening.

## 2017-01-29 ENCOUNTER — Other Ambulatory Visit (INDEPENDENT_AMBULATORY_CARE_PROVIDER_SITE_OTHER): Payer: Self-pay | Admitting: Internal Medicine

## 2017-01-29 ENCOUNTER — Other Ambulatory Visit (FREE_STANDING_LABORATORY_FACILITY): Payer: No Typology Code available for payment source

## 2017-01-29 DIAGNOSIS — R7303 Prediabetes: Secondary | ICD-10-CM

## 2017-01-29 DIAGNOSIS — I10 Essential (primary) hypertension: Secondary | ICD-10-CM

## 2017-01-29 DIAGNOSIS — Z5181 Encounter for therapeutic drug level monitoring: Secondary | ICD-10-CM

## 2017-01-29 LAB — HEMOLYSIS INDEX: Hemolysis Index: 5 (ref 0–18)

## 2017-01-29 LAB — HEMOGLOBIN A1C
Average Estimated Glucose: 111.2 mg/dL
Hemoglobin A1C: 5.5 % (ref 4.6–5.9)

## 2017-01-29 LAB — COMPREHENSIVE METABOLIC PANEL
ALT: 39 U/L (ref 0–55)
AST (SGOT): 35 U/L — ABNORMAL HIGH (ref 5–34)
Albumin/Globulin Ratio: 1.1 (ref 0.9–2.2)
Albumin: 4 g/dL (ref 3.5–5.0)
Alkaline Phosphatase: 91 U/L (ref 37–106)
BUN: 10 mg/dL (ref 7.0–19.0)
Bilirubin, Total: 0.7 mg/dL (ref 0.1–1.2)
CO2: 26 mEq/L (ref 21–29)
Calcium: 10.3 mg/dL (ref 8.5–10.5)
Chloride: 102 mEq/L (ref 100–111)
Creatinine: 0.8 mg/dL (ref 0.4–1.5)
Globulin: 3.7 g/dL (ref 2.0–3.7)
Glucose: 87 mg/dL (ref 70–100)
Potassium: 4.2 mEq/L (ref 3.5–5.1)
Protein, Total: 7.7 g/dL (ref 6.0–8.3)
Sodium: 137 mEq/L (ref 136–145)

## 2017-01-29 LAB — GFR: EGFR: >60

## 2017-01-30 ENCOUNTER — Other Ambulatory Visit (INDEPENDENT_AMBULATORY_CARE_PROVIDER_SITE_OTHER): Payer: Self-pay | Admitting: Internal Medicine

## 2017-01-30 MED ORDER — LOSARTAN POTASSIUM-HCTZ 100-12.5 MG PO TABS
ORAL_TABLET | ORAL | 3 refills | Status: DC
Start: 2017-01-30 — End: 2017-07-20

## 2017-02-01 LAB — PMP DRUG MONITORING, 13 PANEL, URINE
7-Aminoclonazepam: NOT DETECTED ng/mg creat
Alcohol, Ethyl: NEGATIVE
Alcohol, Ethyl: NOT DETECTED g/dL
Alfentanil: NOT DETECTED ng/mg creat
Alpha-Hydroxyalprazolam: 179 ng/mg creat
Alpha-Hydroxytriazolam: NOT DETECTED ng/mg creat
Alprazolam: NOT DETECTED ng/mg creat
Amobarbital: NOT DETECTED
Amphetamine: NOT DETECTED ng/mg creat
Amphetamines: NEGATIVE
Barbital: NOT DETECTED
Barbiturates: NEGATIVE
Benzodiazepines: POSITIVE
Benzoylecgonine: NOT DETECTED ng/mg creat
Buprenorphine: NEGATIVE
Buprenorphine: NOT DETECTED ng/mg creat
Butabarbital: NOT DETECTED
Butalbital: NOT DETECTED
Cannabinoids: NEGATIVE
Carboxy-Thc: NOT DETECTED ng/mg creat
Clonazepam: NOT DETECTED ng/mg creat
Cocaethylene: NOT DETECTED ng/mg creat
Cocaine & Metabolite: NEGATIVE
Cocaine: NOT DETECTED ng/mg creat
Codeine: NOT DETECTED ng/mg creat
Creatinine: 110 mg/dL
Desalkylflurazepam: NOT DETECTED ng/mg creat
Desmethyldiazepam: NOT DETECTED ng/mg creat
Diazepam: NOT DETECTED ng/mg creat
Dihydrocodeine: NOT DETECTED ng/mg creat
Eddp (Methadone Mtb): NOT DETECTED ng/mg creat
Fentanyl: NOT DETECTED ng/mg creat
Hydrocodone: NOT DETECTED ng/mg creat
Hydromorphone: NOT DETECTED ng/mg creat
Lorazepam: NOT DETECTED ng/mg creat
Mda (Ecstasy Mtb): NOT DETECTED ng/mg creat
Mdma (Ecstasy): NOT DETECTED ng/mg creat
Mephobarbital: NOT DETECTED
Methadone: NEGATIVE
Methamphetamine: NOT DETECTED ng/mg creat
Midazolam: NOT DETECTED ng/mg creat
Morphine: 334 ng/mg creat
Norbuprenorphine: NOT DETECTED ng/mg creat
Norcodeine: NOT DETECTED ng/mg creat
Norfentanyl: NOT DETECTED ng/mg creat
Norhydrocodone: NOT DETECTED ng/mg creat
Noroxycodone: NOT DETECTED ng/mg creat
Noroxymorphone: NOT DETECTED ng/mg creat
Opiate Class: POSITIVE
Other Opioids: POSITIVE
Oxazepam: NOT DETECTED ng/mg creat
Oxycodone Class: NEGATIVE
Oxycodone: NOT DETECTED ng/mg creat
Oxymorphone: NOT DETECTED ng/mg creat
Pentobarbital: NOT DETECTED
Phenobarbital: NOT DETECTED
Secobarbital: NOT DETECTED
Sufentanil: NOT DETECTED ng/mg creat
Tapentadol: NEGATIVE
Tapentadol: NOT DETECTED ng/mg creat
Temazepam: NOT DETECTED ng/mg creat
Thiopental: NOT DETECTED
UR Fentanyl & Analogues: NEGATIVE
UR Methadone: NOT DETECTED ng/mg creat

## 2017-02-22 ENCOUNTER — Ambulatory Visit (FREE_STANDING_LABORATORY_FACILITY): Payer: No Typology Code available for payment source | Admitting: Internal Medicine

## 2017-02-22 ENCOUNTER — Encounter (INDEPENDENT_AMBULATORY_CARE_PROVIDER_SITE_OTHER): Payer: Self-pay | Admitting: Internal Medicine

## 2017-02-22 ENCOUNTER — Ambulatory Visit: Payer: No Typology Code available for payment source

## 2017-02-22 VITALS — BP 97/65 | HR 103 | Temp 97.7°F | Wt 189.0 lb

## 2017-02-22 DIAGNOSIS — Z01818 Encounter for other preprocedural examination: Secondary | ICD-10-CM

## 2017-02-22 NOTE — Progress Notes (Signed)
Subjective:       Patient ID: [name] is a 61 y.o. female.    HPI In for pre op evaluation prior to right FCR tenosynovectomy, carpal tunel release and soft tissue mass excision by Dr. Lorin Picket.  Patient Active Problem List   Diagnosis   . Neck pain   . Low back pain   . Essential hypertension   . Primary osteoarthritis of both knees   . Anxiety   . Left knee DJD   . Chronic narcotic use - pain contract 02/04/2016   . Dizziness     Past Surgical History:   Procedure Laterality Date   . ARTHROPLASTY, KNEE, TOTAL Left 12/14/2015    Procedure: ARTHROPLASTY, KNEE, TOTAL;  Surgeon: Cynda Familia, MD;  Location: [facility];  Service: Orthopedics;  Laterality: Left;  LEFT TOTAL KNEE ARTHROPLASTY   . COLONOSCOPY  2016    X1 polyp   . GANGLION CYST EXCISION Right 2007   . INDUCED ABORTION     . KNEE ARTHROSCOPY Right 09/24/2014   . KNEE ARTHROSCOPY Left 03/2015   . VAGINAL DELIVERY      X2 spinals ( 4natural deliveries)     Past Medical History:   Diagnosis Date   . Abnormal vision     glasses   . Anxiety     uses xanax @ bedtime   . Arthritis     bilateral knees   . Carpal tunnel syndrome on both sides     stiffness noted    . Difficulty walking     limps   . Eczema     has prescription cream   . Hypertensive disorder     stable meds   . Lower back pain     sciatica radiates down LLE: Pain range= 0-10   . Neck pain     related to lower back but significant at this time    . Neuropathy     numbness upon awakening from low back radiating down LLE .Marland Kitchen. diminishes after awhile after moving around   . 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      Allergies   Allergen Reactions   . Latex Rash     Current Outpatient Prescriptions   Medication Sig Dispense Refill   . ALPRAZolam (XANAX) 0.5 MG tablet TAKE 1 TABLET BY MOUTH NIGHTLY AS NEEDED 90 tablet 1   . betamethasone dipropionate (DIPROLENE) 0.05 % cream Apply topically 2 (two) times daily as needed. 15 g 1   . DENTA 5000 PLUS 1.1 % Cream BRUSH FOR 2 MINS TWICE  DAILY.EXPECTORATE, DO NOT RINSE. NO EAT/DRINK X30MINS  6   . losartan-hydrochlorothiazide (HYZAAR) 100-12.5 MG per tablet TAKE 1 TABLET BY MOUTH DAILY 90 tablet 3   . traMADol (ULTRAM) 50 MG tablet TAKE 1 TABLET BY MOUTH EVERY 8 HOURS AS NEEDED FOR PAIN 270 tablet 0   . gabapentin (NEURONTIN) 100 MG capsule Take 1 capsule (100 mg total) by mouth 3 (three) times daily. 90 capsule 1   . HYDROcodone-acetaminophen (NORCO) 5-325 MG per tablet Take 1 tablet by mouth 2 (two) times daily as needed for Pain.Do Not fill before 05/05/2016. 180 tablet 0   . meclizine (ANTIVERT) 25 MG tablet Take 1 tablet (25 mg total) by mouth 3 (three) times daily as needed for Dizziness. 30 tablet 0   . naloxone (NARCAN) 4 MG/0.1ML Liquid nasal spray 1 spray intranasally. If pt does not respond or relapses into  respiratory depression call 911. Give additional doses every 2-3 min. 2 each 0     No current facility-administered medications for this visit.      Social History   Substance Use Topics   . Smoking status: Current Every Day Smoker     Packs/day: 0.25     Years: 30.00     Types: Cigarettes   . Smokeless tobacco: Never Used      Comment: keeps on reducing the amount of cigarettes she smokes ...    . Alcohol use No     Family History   Problem Relation Age of Onset   . Hypertension Mother    . Stroke Mother    . Hypertension Father    . Kidney disease Brother          Review of Systems   Constitutional: Negative for activity change and unexpected weight change.   Respiratory: Negative for cough.    Cardiovascular: Negative for chest pain.   Gastrointestinal: Negative for abdominal pain.   Genitourinary: Negative for difficulty urinating.   Musculoskeletal: Positive for arthralgias and back pain.   Skin: Negative for rash.   Hematological: Negative for adenopathy.           Objective:    Physical Exam BP 97/65   Pulse (!) 103   Temp 97.7 F (36.5 C) (Oral)   Wt 85.7 kg (189 lb)   BMI 32.44 kg/m   Constitutional:  Alert, oriented X 3.  NAD  Head    Atraumatic, normocephalic  Eye:   Conjunctival - pink  EENT   Oropharynx is clear, good dentition, TM's - OK  Neck:   Trachea is midline, no thyromegaly  Lymph:  No cervical or supraclavicular adenopathy  Chest:   Lungs clear to auscultation, good breath sounds  Cardiac:  Normal S1&S2 without murmurs. No edema.  Abdomen:  Soft, nontender, no palpable masses, liver or spleen  Back   No CVA or spinous tenderness  Skin:   Warm, dry, no rashes in visible areas  Psych:   Normal affect, thought content    Lab Results   Component Value Date    WBC 10.13 10/04/2016    HGB 14.8 10/04/2016    HCT 45.5 10/04/2016    PLT 307 10/04/2016                            ALT 39 01/29/2017    AST 35 (H) 01/29/2017    NA 137 01/29/2017    K 4.2 01/29/2017    CL 102 01/29/2017    CREAT 0.8 01/29/2017    BUN 10.0 01/29/2017    CO2 26 01/29/2017          GLU 87 01/29/2017    HGBA1C 5.5 01/29/2017     PT 13, PTT 32, INR 1.0    ECG Normal      Assessment:       No medical contraindications to planned surgery.      Plan:     Proceed with surgery.  Procedures  Orders Placed This Encounter   Procedures   . Prothrombin time/INR     Order Specific Question:   Anti-coagulant within 48hours:     Answer:   None   . PTT       Randie Heinz, MD  [facility]  TEL# [phone]

## 2017-02-22 NOTE — Pre-Procedure Instructions (Signed)
EKG/Labs/PCP office note in Epic.  No orders to note

## 2017-02-23 ENCOUNTER — Other Ambulatory Visit (FREE_STANDING_LABORATORY_FACILITY): Payer: No Typology Code available for payment source

## 2017-02-23 DIAGNOSIS — Z01818 Encounter for other preprocedural examination: Secondary | ICD-10-CM

## 2017-02-23 LAB — CBC AND DIFFERENTIAL
Absolute NRBC: 0 10*3/uL
Basophils Absolute Automated: 0.08 10*3/uL (ref 0.00–0.20)
Basophils Automated: 1.1 %
Eosinophils Absolute Automated: 0.19 10*3/uL (ref 0.00–0.70)
Eosinophils Automated: 2.7 %
Hematocrit: 47.2 % — ABNORMAL HIGH (ref 37.0–47.0)
Hgb: 14.6 g/dL (ref 12.0–16.0)
Immature Granulocytes Absolute: 0.01 10*3/uL
Immature Granulocytes: 0.1 %
Lymphocytes Absolute Automated: 3.21 10*3/uL (ref 0.50–4.40)
Lymphocytes Automated: 46.1 %
MCH: 29.8 pg (ref 28.0–32.0)
MCHC: 30.9 g/dL — ABNORMAL LOW (ref 32.0–36.0)
MCV: 96.3 fL (ref 80.0–100.0)
MPV: 11.8 fL (ref 9.4–12.3)
Monocytes Absolute Automated: 0.6 10*3/uL (ref 0.00–1.20)
Monocytes: 8.6 %
Neutrophils Absolute: 2.87 10*3/uL (ref 1.80–8.10)
Neutrophils: 41.4 %
Nucleated RBC: 0 /100 WBC (ref 0.0–1.0)
Platelets: 308 10*3/uL (ref 140–400)
RBC: 4.9 10*6/uL (ref 4.20–5.40)
RDW: 13 % (ref 12–15)
WBC: 6.96 10*3/uL (ref 3.50–10.80)

## 2017-02-23 LAB — PT/INR
PT INR: 1 (ref 0.9–1.1)
PT: 13 s (ref 12.6–15.0)

## 2017-02-23 LAB — PTT (SOFT): PTT: 32 s (ref 23–37)

## 2017-02-27 ENCOUNTER — Encounter (INDEPENDENT_AMBULATORY_CARE_PROVIDER_SITE_OTHER): Payer: Self-pay | Admitting: Internal Medicine

## 2017-03-01 NOTE — H&P (Addendum)
H&P    Patient Name: [name]  Requesting Physician: Pat Patrick *    Assessment:   right FCR tendon release, carpal tunnel release and mass excision    Plan:   surgery    History:   Mallarie Voorhies is a 61 y.o. female who presents to the hospital with FCR tenderness and carpal tunnel syndrome that did not improve with non-operative measures, for which she elects surgical management.    Past Medical History:     Past Medical History:   Diagnosis Date   . Abnormal vision     glasses   . Anxiety     uses xanax @ bedtime   . Arthritis     bilateral knees/OA hips/back   . Carpal tunnel syndrome on both sides     stiffness noted    . Difficulty walking     limps   . Eczema     has prescription cream   . Fracture     finger   . Gastroesophageal reflux disease     heartburn recently diet related/some problem swallowing   . Hypertensive disorder     stable meds   . Lower back pain     sciatica radiates down LLE: Pain range= 0-10   . Neck pain     related to lower back but significant at this time    . Neuropathy     numbness upon awakening from low back radiating down LLE .Marland Kitchen. diminishes after awhile after moving around   . Pneumonia     bronchitis in past   . 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        Past Surgical History:     Past Surgical History:   Procedure Laterality Date   . ARTHROPLASTY, KNEE, TOTAL Left 12/14/2015    Procedure: ARTHROPLASTY, KNEE, TOTAL;  Surgeon: Cynda Familia, MD;  Location: [facility];  Service: Orthopedics;  Laterality: Left;  LEFT TOTAL KNEE ARTHROPLASTY   . COLONOSCOPY  2016    X1 polyp   . GANGLION CYST EXCISION Right 2007   . INDUCED ABORTION     . KNEE ARTHROSCOPY Right 09/24/2014   . KNEE ARTHROSCOPY Left 03/2015   . TONSILLECTOMY     . VAGINAL DELIVERY      X2 spinals ( 4natural deliveries)       Family History:     Family History   Problem Relation Age of Onset   . Hypertension Mother    . Stroke Mother    . Hypertension Father    . Kidney disease  Brother        Social History:     Social History     Social History   . Marital status: Single     Spouse name: N/A   . Number of children: N/A   . Years of education: N/A     Social History Main Topics   . Smoking status: Current Every Day Smoker     Packs/day: 0.25     Years: 30.00     Types: Cigarettes   . Smokeless tobacco: Never Used      Comment: has patches   . Alcohol use No   . Drug use: No   . Sexual activity: Not on file     Other Topics Concern   . Not on file     Social History Narrative   . No narrative on file  Allergies:     Allergies   Allergen Reactions   . Latex Rash       Medications:       Review of Systems:   A comprehensive review of systems was reviewed and documented and I agree    Physical Exam:   There were no vitals filed for this visit.    Intake and Output Summary (Last 24 hours) at Date Time  No intake or output data in the 24 hours ending 03/01/17 1929    General appearance - alert, well appearing, and in no distress and oriented to person, place, and time  Chest - no wheezes, rales or rhonchi, symmetric air entry, no tachypnea, retractions or cyanosis  Heart - normal rate and regular rhythm  Extremities - right FCR tenderness, median nerve numbness and swelling right wrist        Rads:   Radiological Procedure reviewed.     Signed by: Virgilio Frees.

## 2017-03-02 ENCOUNTER — Ambulatory Visit: Payer: No Typology Code available for payment source | Admitting: Certified Registered"

## 2017-03-02 ENCOUNTER — Other Ambulatory Visit: Payer: Self-pay

## 2017-03-02 ENCOUNTER — Ambulatory Visit: Payer: Self-pay

## 2017-03-02 ENCOUNTER — Ambulatory Visit
Admission: RE | Admit: 2017-03-02 | Discharge: 2017-03-02 | Disposition: A | Discharge status: Home / Self Care | Payer: No Typology Code available for payment source | Source: Ambulatory Visit | Attending: Orthopaedic Surgery | Admitting: Orthopaedic Surgery

## 2017-03-02 ENCOUNTER — Encounter
Admission: RE | Disposition: A | Discharge status: Home / Self Care | Payer: Self-pay | Source: Ambulatory Visit | Attending: Orthopaedic Surgery

## 2017-03-02 DIAGNOSIS — K219 Gastro-esophageal reflux disease without esophagitis: Secondary | ICD-10-CM | POA: Insufficient documentation

## 2017-03-02 DIAGNOSIS — M659 Synovitis and tenosynovitis, unspecified: Secondary | ICD-10-CM | POA: Insufficient documentation

## 2017-03-02 DIAGNOSIS — D485 Neoplasm of uncertain behavior of skin: Secondary | ICD-10-CM | POA: Insufficient documentation

## 2017-03-02 DIAGNOSIS — I10 Essential (primary) hypertension: Secondary | ICD-10-CM | POA: Insufficient documentation

## 2017-03-02 DIAGNOSIS — F17210 Nicotine dependence, cigarettes, uncomplicated: Secondary | ICD-10-CM | POA: Insufficient documentation

## 2017-03-02 DIAGNOSIS — G5601 Carpal tunnel syndrome, right upper limb: Secondary | ICD-10-CM | POA: Insufficient documentation

## 2017-03-02 DIAGNOSIS — M67431 Ganglion, right wrist: Secondary | ICD-10-CM | POA: Insufficient documentation

## 2017-03-02 HISTORY — DX: Gastro-esophageal reflux disease without esophagitis: K21.9

## 2017-03-02 HISTORY — PX: REPAIR, UPPER EXTREMITY TENDON: SHX5332

## 2017-03-02 HISTORY — DX: Other injury of unspecified body region, initial encounter: T14.8XXA

## 2017-03-02 HISTORY — DX: Pneumonia, unspecified organism: J18.9

## 2017-03-02 HISTORY — PX: ENDOSCOPIC CARPAL TUNNEL RELEASE: SHX3839

## 2017-03-02 SURGERY — REPAIR, UPPER EXTREMITY TENDON
Anesthesia: Regional | Site: Wrist | Laterality: Right | Wound class: Clean

## 2017-03-02 MED ORDER — SODIUM CHLORIDE 0.9 % IR SOLN
Status: DC | PRN
Start: 2017-03-02 — End: 2017-03-02
  Administered 2017-03-02: 1000 mL

## 2017-03-02 MED ORDER — ROPIVACAINE 0.2 % ON-Q 550 ML ADJ RATE SINGLE FLOW (OUTSOURCED)
Status: DC
Start: 2017-03-02 — End: 2017-03-02
  Filled 2017-03-02: qty 1

## 2017-03-02 MED ORDER — CEFAZOLIN SODIUM 1 G IJ SOLR
INTRAMUSCULAR | Status: DC | PRN
Start: 2017-03-02 — End: 2017-03-02
  Administered 2017-03-02: 2 g via INTRAVENOUS

## 2017-03-02 MED ORDER — PROPOFOL 10 MG/ML IV EMUL (WRAP)
INTRAVENOUS | Status: AC
Start: 2017-03-02 — End: ?
  Filled 2017-03-02: qty 20

## 2017-03-02 MED ORDER — ROPIVACAINE HCL 5 MG/ML IJ SOLN
INTRAMUSCULAR | Status: DC | PRN
Start: 2017-03-02 — End: 2017-03-02
  Administered 2017-03-02: 35 mL via PERINEURAL

## 2017-03-02 MED ORDER — MIDAZOLAM HCL 2 MG/2ML IJ SOLN
INTRAMUSCULAR | Status: AC
Start: 2017-03-02 — End: ?
  Filled 2017-03-02: qty 2

## 2017-03-02 MED ORDER — PROPOFOL INFUSION 10 MG/ML
INTRAVENOUS | Status: DC | PRN
Start: 2017-03-02 — End: 2017-03-02
  Administered 2017-03-02: 75 ug/kg/min via INTRAVENOUS

## 2017-03-02 MED ORDER — LACTATED RINGERS IV SOLN
INTRAVENOUS | Status: DC
Start: 2017-03-02 — End: 2017-03-02

## 2017-03-02 MED ORDER — MIDAZOLAM HCL 2 MG/2ML IJ SOLN
2.0000 mg | Freq: Once | INTRAMUSCULAR | Status: AC
Start: 2017-03-02 — End: 2017-03-02
  Administered 2017-03-02: 14:00:00 2 mg via INTRAVENOUS
  Administered 2017-03-02: 14:00:00 1 mg via INTRAVENOUS

## 2017-03-02 MED ORDER — FAMOTIDINE 20 MG/2ML IV SOLN
INTRAVENOUS | Status: AC
Start: 2017-03-02 — End: ?
  Filled 2017-03-02: qty 2

## 2017-03-02 MED ORDER — ROPIVACAINE 0.2 % ON-Q 550 ML ADJ RATE SINGLE FLOW (OUTSOURCED)
Status: DC
Start: 2017-03-02 — End: 2017-03-02
  Administered 2017-03-02: 15:00:00 550 mL via PERCUTANEOUS

## 2017-03-02 MED ORDER — SODIUM CHLORIDE 0.9 % IV SOLN
INTRAVENOUS | Status: DC
Start: 2017-03-02 — End: 2017-03-02

## 2017-03-02 MED ORDER — BUPIVACAINE HCL (PF) 0.5 % IJ SOLN
INTRAMUSCULAR | Status: AC
Start: 2017-03-02 — End: ?
  Filled 2017-03-02: qty 30

## 2017-03-02 MED ORDER — PROPOFOL INFUSION 10 MG/ML
INTRAVENOUS | Status: DC | PRN
Start: 2017-03-02 — End: 2017-03-02
  Administered 2017-03-02: 30 mg via INTRAVENOUS

## 2017-03-02 MED ORDER — METOCLOPRAMIDE HCL 5 MG/ML IJ SOLN
INTRAMUSCULAR | Status: AC
Start: 2017-03-02 — End: ?
  Filled 2017-03-02: qty 2

## 2017-03-02 MED ORDER — OXYCODONE HCL 5 MG PO TABS
5.0000 mg | ORAL_TABLET | ORAL | 0 refills | Status: DC | PRN
Start: 2017-03-02 — End: 2017-07-24
  Filled 2017-03-02: qty 40, 7d supply, fill #0

## 2017-03-02 MED ORDER — FAMOTIDINE 10 MG/ML IV SOLN (WRAP)
INTRAVENOUS | Status: DC | PRN
Start: 2017-03-02 — End: 2017-03-02
  Administered 2017-03-02: 20 mg via INTRAVENOUS

## 2017-03-02 MED ORDER — METOCLOPRAMIDE HCL 5 MG/ML IJ SOLN
INTRAMUSCULAR | Status: DC | PRN
Start: 2017-03-02 — End: 2017-03-02
  Administered 2017-03-02: 10 mg via INTRAVENOUS

## 2017-03-02 SURGICAL SUPPLY — 100 items
ADHESIVE SKIN CLOSURE DERMABOND ADVANCED (Skin Closure) ×2
ADHESIVE SKIN CLOSURE DERMABOND ADVANCED .7 ML LIQUID APPLICATOR (Skin Closure) ×3 IMPLANT
ADHESIVE SKIN DERMABOND ADV (Skin Closure) ×1
ADHESIVE SKNCLS 2 OCTYL CYNCRLT .7ML (Skin Closure) ×1
BANDAGE ACE STERILE 4IN LF (Bandage) ×1
BANDAGE CMPR PLSTR CTTN CRTY CNFRM 75X3 (Bandage) ×3 IMPLANT
BANDAGE CMPR PLSTR CTTN CRTY CNFRM 75X4 (Bandage) ×3
BANDAGE CMPR PLSTR CTTN MED MTRX 5YDX4IN (Bandage) ×1
BANDAGE CMPR PLSTR CTTN SFWRP 5YDX3IN LF (Procedure Accessories) ×1
BANDAGE CMPR PLSTR CTTN SFWRP 5YDX4IN LF (Procedure Accessories) ×1
BANDAGE COMPRESSION L5 YD X W4 IN ELASTIC CLIP CLOSURE STRETCH (Procedure Accessories) ×2 IMPLANT
BANDAGE CONFORM STERILE 3IN (Bandage) ×1
BANDAGE CURITY CONFORM COMPRESSION L75 IN X W4 IN 1 PLY ABSORBENT (Bandage) ×2 IMPLANT
BANDAGE KLING NONSTERILE 4IN (Bandage) ×1
BANDAGE MEDIUM COMPRESSION L5 YD X W4 IN ELASTIC HOOK LOOP CLOSURE (Bandage) ×2 IMPLANT
BANDAGE MEDLINE COMPRESSION L5 YD X W3 (Procedure Accessories) ×2
BANDAGE MEDLINE COMPRESSION L5 YD X W4 (Procedure Accessories) ×2
BANDAGE MEDLINE MEDIUM COMPRESSION L5 YD (Bandage) ×2
BANDAGE MEDLN COMP L5 YD X W3 IN ELASTIC CLIP CLSR PLSTR COTTON BEIGE (Procedure Accessories) ×2 IMPLANT
BANDAGE STERI-STRIP 0.5X4IN (Dressing) ×1
BDG ELSTC SFTWRP STL 3X5YD (Procedure Accessories) ×1
BDG ELSTC SFTWRP STL 4X5YD (Procedure Accessories) ×1
BLADE AGEE CARPAL TUNNEL ASSY (Ortho Supply) ×4 IMPLANT
BLADE MINI BEAVER (Blade) ×4 IMPLANT
COVER CAM LF STRL LEN DISP CLR (Procedure Accessories) ×3
COVER CAMERA DISP STERILE (Procedure Accessories) ×1
COVER CAMERA LENS DISPOSABLE CLEAR STERILE LATEX FREE (Procedure Accessories) ×2 IMPLANT
DEVC SLING ARM LG BLU (Patient Supply)
DRESSING PETRO 3% BI 3BRM GZE XR 8X1IN (Dressing) ×1
DRESSING PETROLATUM XEROFORM L8 IN X W1 (Dressing) ×2
DRESSING PETROLATUM XEROFORM L8 IN X W1 IN 3% BISMUTH TRIBROMOPHENATE (Dressing) ×2 IMPLANT
DRESSING XEROFORM 1X8 (Dressing) ×1
GAUZE SPONGE 4X4 NS (Dressing) ×1
GLOVE BIOGEL SURGCL INDICTR 8 (Glove) ×2
GLOVE SRG NTR RBR 8 INDCTR BGL 299X103MM (Glove) ×2
GLOVE SURG BIOGEL MICRO SZ7.5 (Glove) ×4 IMPLANT
GLOVE SURG BIOGEL SZ7.5 (Glove) ×8 IMPLANT
GLOVE SURGICAL 8 INDICATOR BIOGEL POWDER (Glove) ×4
GLOVE SURGICAL 8 INDICATOR BIOGEL POWDER FREE SMOOTH BEAD CUFF (Glove) ×4 IMPLANT
GOWN SMART IMPERVIOUS LARGE (Gown) ×12 IMPLANT
GOWN SMART XLG (Gown) ×2
GOWN SRG XL SMARTGOWN LF STRL LVL 4 (Gown) ×2
GOWN SURGICAL XL SMARTGOWN LEVEL 4 (Gown) ×4
GOWN SURGICAL XL SMARTGOWN LEVEL 4 BREATHABLE (Gown) ×4 IMPLANT
HANDLE LGHT LF STRL ADP LGHT CNTRL + TCH (Other) ×3
HANDLE LIGHT ADAPTIVE LIGHT CONTROL PLUS (Other) ×6
HANDLE LIGHT ADAPTIVE LIGHT CONTROL PLUS TECHNOLOGY SNAP ON LENS TOUCH (Other) ×6 IMPLANT
HANDLE TRUMPF LIGHT  DISP (Other) ×3
INHIBITOR ANTI FOG (Procedure Accessories) IMPLANT
KIT HAND AND UPPER EXTREMITY (Pack) ×8 IMPLANT
KIT INFECTION CONTROL CUSTOM (Kits) ×4
KIT INFECTION CONTROL CUSTOM IFOH03 (Kits) ×2 IMPLANT
NEEDLE ELECTRODE (Cautery) ×8 IMPLANT
ORM-D SCRUB CHLORHEXIDINE GLUC (Scrub Supplies) ×8 IMPLANT
SLING ARM L17 IN X W8 IN DEEP POCKET (Sling)
SLING ARM L17 IN X W8 IN DEEP POCKET SHOULDER PAD HOOK LOOP CLOSURE (Sling) IMPLANT
SLING ARM L20 IN X W9 IN DEEP POCKET (Patient Supply)
SLING ARM L20 IN X W9 IN DEEP POCKET SHOULDER PAD HOOK LOOP CLOSURE (Patient Supply) IMPLANT
SLING ARM MD BLU (Sling)
SLING ORTH TIETX FM LG 20X9IN LF NS DP (Patient Supply)
SLING ORTH TIETX FM MED 17X8IN LF NS DP (Sling)
SPLINT DELTA 4 IN X 15 FT (Splint) ×4 IMPLANT
SPONGE GAUZE L4 IN X W4 IN 12 PLY (Sponge) ×4 IMPLANT
SPONGE GAUZE L4 IN X W4 IN 16 PLY (Dressing) ×2
SPONGE GAUZE L4 IN X W4 IN 16 PLY MAXIMUM ABSORBENT USP TYPE VII (Dressing) ×1 IMPLANT
SPONGE GAUZE STR 10'S 12PLY4X4 (Sponge) ×2
SPONGE GZE CTTN CRTY 4X4IN LF NS 16 PLY (Dressing) ×1
SPONGE GZE PLS CTTN CRTY 4X4IN LF STRL (Sponge) ×2
STRIP SKIN CLOSURE L4 IN X W1/2 IN (Dressing) ×2
STRIP SKIN CLOSURE L4 IN X W1/2 IN REINFORCE STERI-STRIP POLYESTER (Dressing) ×3 IMPLANT
STRIP SKNCLS PLSTR STRSTRP 4X.5IN LF (Dressing) ×1
SUT VICRYL 3-0 SH 27IN (Suture) ×1
SUT VICRYL 4-0 P3 18IN (Suture)
SUTURE ABS 3-0 RB1 VCL 27IN BRD COAT UD (Suture) ×1
SUTURE ABS 3-0 SH VCL 27IN BRD COAT UD (Suture) ×1
SUTURE ABS 4-0 P-3 MNCRL MTPS 18IN MFL (Suture) ×2
SUTURE ABS 4-0 P-3 VCL MTPS 18IN BRD (Suture)
SUTURE ABS 4-0 PS2 MNCRL MTPS 18IN MFL (Suture) ×1
SUTURE COATED VICRYL 3-0 RB-1 L27 IN (Suture) ×2
SUTURE COATED VICRYL 3-0 RB-1 L27 IN BRAID COATED UNDYED ABSORBABLE (Suture) ×2 IMPLANT
SUTURE COATED VICRYL 3-0 SH L27 IN BRAID (Suture) ×2 IMPLANT
SUTURE COATED VICRYL 4-0 P-3 L18 IN (Suture)
SUTURE COATED VICRYL 4-0 P-3 L18 IN BRAID COATED UNDYED ABSORBABLE (Suture) IMPLANT
SUTURE ETHILON 4-0 P3 18IN (Suture) ×1
SUTURE ETHILON UNDYED 4-0 P-3 L18 IN (Suture) ×2
SUTURE ETHILON UNDYED 4-0 P-3 L18 IN MONOFILAMENT NONABSORBABLE (Suture) ×2 IMPLANT
SUTURE MONOCRYL 4-0 P-3 L18 IN (Suture) ×4
SUTURE MONOCRYL 4-0 P-3 L18 IN MONOFILAMENT UNDYED ABSORBABLE (Suture) ×4 IMPLANT
SUTURE MONOCRYL 4-0 P3 18IN (Suture) ×2
SUTURE MONOCRYL 4-0 PS-2 L18 IN (Suture) ×2
SUTURE MONOCRYL 4-0 PS-2 L18 IN MONOFILAMENT UNDYED ABSORBABLE (Suture) ×2 IMPLANT
SUTURE MONOCRYL 4-0 PS2 18 IN (Suture) ×1
SUTURE NABSB 4-0 P-3 ETH 18IN MFL UD (Suture) ×1
SUTURE VICRYL 3-0 RB1 27IN (Suture) ×1
SUTURE VICRYL 3-0 SH 8X18IN (Suture) ×4 IMPLANT
TOURNIQUET 18IN STRL (Procedure Accessories) ×8 IMPLANT
TOWEL L26 IN X W17 IN COTTON PREWASH DELINT BLUE ACTISORB DELUXE (Procedure Accessories) IMPLANT
TOWEL OR STRL DLUX BLUE 10PK (Procedure Accessories)
TOWEL SRG CTTN 26X17IN LF STRL PREWASH (Procedure Accessories)
TRAY SKIN DRY SCRUB (Tray) ×8 IMPLANT

## 2017-03-02 NOTE — Anesthesia Procedure Notes (Signed)
Peripheral    Patient location during procedure: Pre-Op  Reason for block: Post-op pain managment  Injection technique: Catheter  Block Region: Supraclavicular  Laterality: Right  Block at surgeon's request Yes  Start time: 03/02/2017 1:40 PM  End time: 03/02/2017 1:56 PM    Staffing  Anesthesiologist: Danelle Berry  Performed: Anesthesiologist     Pre-procedure Checklist   Completed: patient identified, surgical consent, pre-op evaluation, timeout performed, risks and benefits discussed, anesthesia consent given and correct site  Timeout Completed:  03/02/2017 1:40 PM    Peripheral Block  Patient monitoring: Pulse oximetry, EKG, NIBP and Nasal cannula O2  Patient position: Sitting  Premedication: Yes and Meaningful contact maintained  Local infiltration: Lidocaine 1%    Needle  Needle type: Tuohy   Needle gauge: 18 G  Needle length: 3 in  Catheter size: 20 G  Catheter at skin depth: 8 cm    Procedures: ultrasound guided  Ultrasound Guided: LA spread visualized, Needle visualized, Relevant anatomy identified (nerve, vessels, muscle), Image stored or printed and Catheter visualized      Assessment   Incremental injection: yes  Injection made incrementally with aspirations every 5 mL.  Injection Resistance: no  Paresthesia Pain: No    Blood Aspirated: No  no suspected intravascular injection  Patient tolerated procedure well: Yes  Block Outcome: No complications, Successful block and Pain improved

## 2017-03-02 NOTE — Anesthesia Preprocedure Evaluation (Signed)
Anesthesia Evaluation    AIRWAY    Mallampati: II    TM distance: >3 FB  Neck ROM: full  Mouth Opening:full   CARDIOVASCULAR    cardiovascular exam normal       DENTAL    no notable dental hx     PULMONARY    pulmonary exam normal     OTHER FINDINGS    Pt id and h&P reviewed.          Relevant Problems   No relevant active problems               Anesthesia Plan    ASA 3     peripheral nerve block               (Anesthesia plan described,risks and benefits explained. Patient agrees with the plan.)      intravenous induction   Detailed anesthesia plan: PNB and general IV  Monitors/Adjuncts: other    Post Op: other  Post op pain management: per surgeon and PNB catheter    informed consent obtained    Plan discussed with CRNA.                   Signed by: Danelle Berry 03/02/17 2:15 PM

## 2017-03-02 NOTE — Progress Notes (Signed)
Block procedure completed with sedation. Procedure discussed prior to start. Time out performed with anesthesiologist. Patient tolerated the procedure with no adverse side effects.  Peripheral nerve block education initiated, including fall precautions/sensory deficits and pain management. Written instruction provided including phone numbers to contact anesthesia with questions/concers 24 hours a day. Patient and responsible adult verbalized understanding.

## 2017-03-02 NOTE — Discharge Instructions (Signed)

## 2017-03-02 NOTE — Transfer of Care (Signed)
Anesthesia Transfer of Care Note    Patient: [name]    Procedures performed: Procedure(s) with comments:  REPAIR, UPPER EXTREMITY TENDON, EXCISION, GANGLION CYST - right wrist flexor carpi radialis tenosynovectomy w/ possible carpal tunnel release and soft tissue mass excision  q1-unk, asst=n, equip=needle tip bovie, beaver blade, lead hand, micro aire, md req 90 min/ok per JD  ENDOSCOPIC CARPAL TUNNEL RELEASE    Anesthesia type: General TIVA    Patient location:Phase I PACU    Last vitals:   Vitals:    03/02/17 1410   BP:    Pulse: 92   Resp:    Temp:    SpO2: 95%       Post pain: Patient not complaining of pain, continue current therapy      Mental Status:awake    Respiratory Function: tolerating room air    Cardiovascular: stable    Nausea/Vomiting: patient not complaining of nausea or vomiting    Hydration Status: adequate    Post assessment: no apparent anesthetic complications    Signed by: Janan Halter  03/02/17 3:07 PM

## 2017-03-02 NOTE — Anesthesia Postprocedure Evaluation (Signed)
Anesthesia Post Evaluation    Patient: [name]    Procedures performed: Procedure(s) with comments:  REPAIR, UPPER EXTREMITY TENDON, EXCISION, GANGLION CYST - right wrist flexor carpi radialis tenosynovectomy w/ possible carpal tunnel release and soft tissue mass excision  q1-unk, asst=n, equip=needle tip bovie, beaver blade, lead hand, micro aire, md req 90 min/ok per JD  ENDOSCOPIC CARPAL TUNNEL RELEASE    Anesthesia type: Regional Anesthesia    Patient location:Phase II PACU    Last vitals:   Vitals:    03/02/17 1530   BP: 139/76   Pulse: 90   Resp: 16   Temp:    SpO2: 94%       Post pain: Patient not complaining of pain, continue current therapy      Mental Status:awake    Respiratory Function: tolerating room air    Cardiovascular: stable    Nausea/Vomiting: patient not complaining of nausea or vomiting    Hydration Status: adequate    Post assessment: no apparent anesthetic complications    Signed byDanelle Berry, 03/02/2017 4:38 PM

## 2017-03-02 NOTE — Progress Notes (Signed)
Awake breathing spontaneously on room air  Rt hand dressing clean & dry with arm sling in pace . On Q pump ropivicaine at 8ml / hr started

## 2017-03-02 NOTE — Discharge Instr - AVS First Page (Addendum)
Discharge Instructions    Procedure: Endoscopic Carpal Tunnel Syndrome Release, Ganglion cyst Excision and Flexor Carpi Radialis Tendon Release  Goals of Surgery  - Surgery is done when symptoms are severe, there is significant compression of the nerve and tendon, or non-operative treatment has failed to resolve symptoms.  - With endoscopic surgery, your surgeon makes one incision in your wrist.  Standard surgical tools are used.  Your surgeon will relieve the pressure on the median nerve.  To do this, the transverse carpal ligament is cut (released).      From: [employer]    After Surgery  - If you've undergone an endoscopic carpal tunnel release, you will spend time resting in the PACU before you go home.  The nerve sensation and circulation in your hand will be checked at this time.  Regional Nerve Block  - If you have undergone a regional nerve block, please be aware of the following:  o Keep your arm in the sling until the block has completely worn off for support and protection.   o Be careful around heat sources and avoid use of heavy machinery.  o Start taking pain medication before the block wears off so that the medication has time to work before the block wears off.  o You may feel pins and needles in your arm/hand as the block wears off.  o Once the block has completely worn off and you can feel and move your arm freely, you do not need to wear the sling anymore.    Potential Risks/Complications  - With any surgery, the potential risks and complications are the following:  o Infection  o Bleeding  o Pain  o Stiffness  o Nerve and vessel injury  o Complications secondary to the anesthetic  - Complications/Risks specific to endoscopic carpal tunnel release are the following:  o Incomplete release  o Injury to the median nerve  o Post-operative scarring around the median nerve       Home Care  - Make at least 10 fists a day.   - Avoid heavy lifting, pushing, and pulling with  your affected arm.  Also, avoid activities that involve standing water and dirty environments, such as dishwashing and gardening.  - Always keep the dressing dry and clean.  - Remove dressing in 5 days.  Do not remove Steri-Strips/surgical tape strips.  Continue to keep wound covered (with a Band-Aid or Coban) in public or dirty environments.    - When showering for the first 5 days, cover your dressing with plastic/saran wrap or cast cover to always keep the dressing or splint dry.  After you removed the surgical dressing, clean running water can contact your wound.  However, you should avoid tub bathing, swimming, dishwashing, and other activities that would place your wound under water until your wound is fully healed.  - Use an ice pack or bag of frozen peas - or something similar - wrapped in a thin towel on your wrist to reduce swelling for the first 72 hours.   Leave the ice pack on for 30 minutes; then take it off for 30 minutes.  Repeat as needed.  - Keep your arm elevated above your heart as much as possible for the first 5 days after surgery.  - Do not use hydrogen peroxide, bacitracin, Neosporin, or lotions on wound unless directed by your physician.    - Take pain medication as directed.  Never drive while you are taking narcotic pain  medication.  - Ask your doctor when you can return to work.  If your job requires heavy lifting, you may not be able to begin working for several weeks.  Please allow 10 business days for paperwork to be completed.  When to Seek Medical Attention  - CALL 911 RIGHT AWAY if you have any of the following:  o Chest pain  o Shortness of breath  - Otherwise, call your doctor immediately if you have any of the following:  o A splint, cast, or dressing that has become wet  o Increased bleeding or drainage from the incision  o Opening of the incision  o Fever above 101.53F or shaking chills  o Any new numbness in the fingers or thumb  o White or pale-blue hand or nails (If you  pinch your skin or nail and the color doesn't return)  o Increased pain with or without activity that is not relieved by prescribed medication  o Increased redness, tenderness, or swelling of the incision  Follow Up Appointments  - Follow up appointments will be necessary to check wound healing and function.  If you are not given a follow up appointment, please make an appointment 5-7 days after surgery, unless directed otherwise by physician.   - Once your wound is fully healed (usually takes 3-5 weeks), you will have no restrictions.  After some surgeries, it can take up to a year for you to attain your ultimate level of function.   - Occupational Therapy (OT) is recommended to improve your range of motion and strength during your postoperative course. Schedule an OT appointment 7-10 days after surgery.  It is recommended that you call OT prior to your procedure as there may be a delay in start time due to the therapists' availability.        FREQUENTLY ASKED QUESTIONS  What is the anticipated total recovery time?  You should expect to return to light sedentary activities 3-5 days after your surgery.  It may take 3-5 weeks to return to heavy duty activities, such as intense lifting, pushing, and pulling.  Previously, I have had an allergic reaction to Steri-Strips or sutures.  What will be used to close the incision (stitches)?  Will it increase my recovery time?  Typically, the incision is closed with 3-0 vicryl sutures, 4-0 monocryl sutures, Derma Bond and Steri-Strips. However, if you have had an allergic reaction to any of these things in the past, we can modify the way the incision is closed without affecting your recovery time.  Please let your physician know before your surgery if you have had any allergic reactions to sutures, Steri-Strips, or Derma Bond.  Can I drive myself home after surgery?  No, you will need to have someone drive you home from your surgery and you should avoid driving for 24 hours  after your surgery.  Will I require someone to stay home with me during the day (after surgery) and if so, for how long?  With any surgery that involves sedation, it is recommended that you have a responsible adult with you 24 hours after sedation for your comfort and safety.  After that, it is left up to your discretion.    If you have any questions, your anesthesiologist can address that on the day of surgery.   What type of pain medication will be issued and for how long?  You are typically prescribed a narcotic pain medication after your surgery, however, most patients only require Tylenol or Motrin  for pain control.  Sometimes at night, the pain may increase or you may hyper focus on the pain, therefore, you may need to increase your pain medication.  Please let your physician know if you are allergic to any pain medications.  How long before I can shower?  Shower as necessary.  When showering for the first 5 days, cover your dressing with plastic/saran wrap or cast cover to keep the dressing dry.  After your surgical dressing is removed 5 days after surgery, clean running water can contact your wound.  However, you should avoid tub bathing, swimming, and dishwashing, or other activities that would place your wound under water until your wound is fully healed.  How often will the bandage need to be changed?  The operative dressing should be removed 5 days after your surgery.  At this time, you will need to cover the wound in public and dirty environments with either Band-Aid or Coban until your wound is fully healed (usually takes 3-5 weeks).   How many or often will follow-up visits be required?  Follow up appointments will usually take place: Week 1, Week 2, and Week 4.  Your physician will determine follow-up visits after that.  Are there any follow-up X-rays, scans, or MRIs required?  No, unless your symptoms have not improved 6-12 months after surgery.  At this time, a repeat EMG/NCS and wrist MRI may be  recommended.  Will therapy be required and if so, for approximately how long?  Occupational Therapy (OT) is recommended to help with your range of motion and strengthening during your postoperative course.  Therapy should be started 7-10 days after your surgery.  Your therapist will advise you on the length of therapy and frequency of sessions based on your improvement and desired level of function.  Will I need to purchase any items now in preparation for post-surgery recovery (i.e. bandages, ointments, new brace, stress ball etc.)?  For wound care, large Band-Aids (about 2 in x 4 in) should cover the entire wound.  Once your wound is fully healed, Vitamin E oil, Mederma, or Shea Cocoa Butter should be used with scar massages.  Occupational Therapy can advise you of any special equipment you may need.  What are the lifting restrictions during recovery? Max? How long?  You should avoid lifting anything greater than 5 lbs.  After your wound is fully healed, you may continue to add activities as tolerated.  It may take time until you will be pain free when lifting heavy objects.  Listen to your body to guide how much weight you can comfortably lift.  How long before I can type on a keyboard?  You can perform activities using fine motor skills right away (typing, texting, keyboard use).  You might not be able to type as fast as you could before the surgery, however, there are no restrictions regarding typing.  Always reintroduce activities at a comfortable pace.  Should I limit the number of hours I work once I return?  Working on a modified schedule is recommended for the first week or two after surgery.  You can discuss this further with your employer and physician.  If paperwork is needed for your absence, please allow 10 business days for completion.   Should I limit my driving?  If driving causes pain it is not recommended.    Are there any travel restrictions following surgery?  Please discuss travel plans with  your physician.  Travel is usually not recommended until your  wound is fully healed (usually takes 3-5 weeks).

## 2017-03-03 NOTE — Op Note (Addendum)
Endoscopic Carpal Tunnel Release, Ganglion cyst excision and FCR tendon release Operative Note : [id number]    Indications: right carpal tunnel syndrome with failed conservative care.    Date of Operation:   03/02/2017    Location of Operation:   [facility]    Providers Performing:   Surgeon(s):  Lorin Picket, Ellwood Dense., MD    Assistant (s):   Circulator: Markham Jordan., RN  Scrub Person: Devonne Doughty Y      Preoperative Diagnosis:   Pre-Op Diagnosis Codes:     * FCR (flexor carpi radialis) tenosynovitis [M65.9]     * Carpal tunnel syndrome on right [G56.01]     * Neoplasm of uncertain behavior of skin [D48.5]    Postoperative Diagnosis:   SAME    Anesthesia:   Regional    Estimated Blood Loss:    * No values recorded between 03/02/2017  2:16 PM and 03/02/2017  3:03 PM *    Procedure Details   The patient was seen in the Holding Room. The risks, benefits, complications, treatment options, and expected outcomes were discussed with the patient. The risks and potential complications of their problem and proposed treatment include but are not limited to infection, bleeding, pain, stiffness, nerve and vessel injury and complication secondary to the anesthetic. The patient concurred with the proposed plan, giving informed consent.  The site of surgery properly noted/marked. Prophylactic antibiotics were given. The patient was taken to Operating Room, identified as [name] and the procedure was verified. A Time Out was held and the above information confirmed.    After administration of adequate anesthesia a pneumatic tourniquet was placed  Around the proximal portion of the operative limb. The limb was prepped and draped in a free sterile field in the usual manner.        A skin pen was used to mark  the proposed incisions.  The locations of the pisiform and the hamulus were marked.  The axis of the 4th ray was marked in the palm with a stripe on the ring finger pulp which was wiped  across the palmar skin by flexing the finger.  A transverse 2-cm incision was marked at the distal wrist flexion crease proximal to the glabrous palmar skin, extending ulnarward from the radial edge of the palmaris longus.   The limb was exsanguinated and tourniquet inflated.        Attention was turned to the wrist crease incision, which was then made.  Spreading longitudinal dissection was done.  Superficial veins were cauterized.  The antebrachial fascia was seen deep to the palmaris.  The palmaris was retracted radially and a longitudinal incision made from the proximal edge of the transverse carpal ligament through the contiguous antebrachial fascia for about 2-cm ,A spatula elevator was used to tease the flexor tenosynovium away from the deep surface of the antebrachial fascia, thereby creating a distally based  fascial and ligamentous flap.  The leading distal edge of antebrachial fascia was cut longitudinally with blunt scissors under direct visualization in the volar midline, staying both superficial and slightly ulnar to the visualized and protected median nerve beneath.       The antebrachial flap was pulled distally; the spatula elevator was used to tease tenosynovium off the surface of the transverse carpal ligament.  Care was taken to stay within the carpal canal, to exclude all structures other than the ligament from the viewing/cutting window; and the scope was inserted in line with  the 4th ray axis after the pathway had been explored with the series of probes/'dilators' prior to scope insertion.  The distal half of the ligament was cut in a distal to proximal direction, elevating the blade first at the distal edge of the ligament as seen by 'bouncing' the palmar skin and fat. A second pass was performed to confirm that the ligament edges were separate and to verify that the distal edge of each leaf hung freely. The transverse carpal ligament was then released completely in a distal to proximal  direction, fully excluding all tissues save the ligament from the cutting window.  The scope was reinserted to verify complete separation of the ligament along its length, with both the radial and ulnar edges being run visually.  There was wide separation of the ligament edges such that both could not be seen simultaneously.  Light transmission was increased in the palm, volume was markedly larger, and the scope was now palpable in the canal. The remnant of the flap use to introduce the scope was then fully excised.       Following scope withdrawal a Miller-Senn elevator was used to visually verify that the ligament was fully divided and that there was no additional pathology within the canal.  moderate tenosynovitis was appreciated.  The nerve was inspected proximal and distal to the area of release to again verify its state.    The cyst and FCR  was approached through a separate longitudinal incision. Bluntly the deep tissues were dissected, taking care to avoid injury to the radial artery. The ganglion cyst was encountered and excised with its wall. Blunt dissection was carried down to the FCR tendon sheath The tendon sheath was exposed and incised longitudinally under direct visualization.  The FCR was released. Tenosynovitis was debrided.         Upon tourniquet release there was immediate refill of all fingers. Hemostasis was secured with direct pressure but did not require additional methods. The wound was closed with interrupted  3-0 vicryl sutures and 4-0 monocryl for skin and sealant. Skin sealant was then applied to the wound. The hand was placed in a sterile dressing after non-adherent gauze was applied to the wounds.      Instrument, sponge, and needle counts were correct prior the closure and at the conclusion of the case.     Findings: Median nerve and FCR tendon compression, ganglion cyst    Estimated Blood Loss:  Minimal  Drains: None  Specimens: None  Complications:  None; the patient tolerated the  procedure well.  Disposition:PACU - hemodynamically stable  Condition: Stable

## 2017-03-03 NOTE — Progress Notes (Signed)
Chart reviewed and as per Dr. Iran Sizer;  Subjective: The patient was complaining of (03/02/17 @ 2030) Right eye swelling and unconfortable numbness of the right arm and requesting the catheter to be removed.        Mental status: awake and oriented  Respiratory function: stable  Pain well controlled under current regimen.    Catheter site clean and dry.   The catheter was removed in response to the patient's request.       [name]

## 2017-03-05 ENCOUNTER — Encounter: Payer: Self-pay | Admitting: Orthopaedic Surgery

## 2017-04-20 ENCOUNTER — Other Ambulatory Visit (INDEPENDENT_AMBULATORY_CARE_PROVIDER_SITE_OTHER): Payer: Self-pay | Admitting: Internal Medicine

## 2017-04-20 NOTE — Telephone Encounter (Signed)
Name, strength, directions of requested refill(s):   ALPRAZolam (XANAX) 0.5 MG tablet    losartan-hydrochlorothiazide (HYZAAR) 100-12.5 MG per tablet    traMADol (ULTRAM) 50 MG tablet       Pharmacy to send refill to or patient to pick up rx from office (mark requested pharmacy in BOLD):      [facility]  [address]  [place] [address]  Phone: [phone] Fax: [phone]    [id number] - [address], [place] - [address]  [address] [address] [place] [address]  Phone: [phone] Fax: [phone]        Please mark "X" next to the preferred call back number:    Mobile:   Telephone Information:   Mobile [phone]       Home: [phone]    Work: [phone]          Next visit: Visit date not found

## 2017-04-20 NOTE — Telephone Encounter (Signed)
Forwarded to Dr. Mathews nurse

## 2017-04-23 ENCOUNTER — Other Ambulatory Visit: Payer: Self-pay

## 2017-04-23 MED ORDER — ALPRAZOLAM 0.5 MG PO TABS
ORAL_TABLET | ORAL | 0 refills | Status: DC
Start: 2017-04-23 — End: 2017-07-20
  Filled 2017-04-23: qty 90, 90d supply, fill #0

## 2017-04-24 ENCOUNTER — Telehealth (INDEPENDENT_AMBULATORY_CARE_PROVIDER_SITE_OTHER): Payer: Self-pay

## 2017-04-24 ENCOUNTER — Other Ambulatory Visit (INDEPENDENT_AMBULATORY_CARE_PROVIDER_SITE_OTHER): Payer: Self-pay | Admitting: Internal Medicine

## 2017-04-24 NOTE — Telephone Encounter (Signed)
[facility]. Spoke to [facility]. Called pt to advise.

## 2017-04-30 ENCOUNTER — Other Ambulatory Visit: Payer: Self-pay

## 2017-05-02 ENCOUNTER — Other Ambulatory Visit: Payer: Self-pay

## 2017-05-08 ENCOUNTER — Other Ambulatory Visit: Payer: Self-pay

## 2017-06-12 ENCOUNTER — Telehealth (INDEPENDENT_AMBULATORY_CARE_PROVIDER_SITE_OTHER): Payer: Self-pay | Admitting: Internal Medicine

## 2017-06-12 NOTE — Telephone Encounter (Signed)
Called pt to advise need to establish care with another pcp in order for letter. LM to call back.

## 2017-06-12 NOTE — Telephone Encounter (Addendum)
Patient requesting a letter for the pain Management office to refill her medications. She is previous patient of Dr. Jerolyn Center. Any questions give her a call at [phone]

## 2017-07-15 NOTE — Progress Notes (Deleted)
No chief complaint on file.     Patient is new to me   61 y.o.  female presents for followup of chronic conditions including:    There were no vitals taken for this visit.      PROBLEM LIST:  HTN--on losartan-hctz 100-12.5mg ; compliant with meds; no se's.  Good control  BP Readings from Last 3 Encounters:   03/02/17 139/76   02/22/17 97/65   01/26/17 123/78   Chronic pain--on oxycodone 5mg  1-2 q4 (max 12/24hrs), norco 5-325mg  bid, tramadol 50mg  q8 prn, gabapentin 100mg  tid  OA--bilat knees, hips, back (LLE sciatica)  Dizziness--on antivert 25mg  prn  GERD--on mg prn?; no se's.  Good control   Anxiety--on xanax 0.5mg  prn qhs  Eczema--on betamethasone cream prn  Carpal tunnel--bilat  Nl BMI--There is no height or weight on file to calculate BMI. Discussed working on diet/exercise.  Wt Readings from Last 3 Encounters:   03/02/17 85.5 kg (188 lb 6.4 oz)   02/22/17 85.7 kg (189 lb)   01/26/17 87.7 kg (193 lb 6.4 oz)     SOCIAL HISTORY: Single  Smoker (1/4ppd x 30+yrs)    HEALTH MAINTENANCE:  Shingrix:  Prevnar:  Pneumovax:  Hep C: No results found for: HCVAB  EKG: 6/18  ASCVD Risk (07/17/2017):  ASA:  BMD:   Ca/VitD:    Immunization History   Administered Date(s) Administered   . Influenza (Im) Preserved TRIVALENT VACCINE 06/30/2014   . Influenza quadrivalent (IM) 3 Yrs & greater 07/15/2015   . Influenza quadrivalent (IM) PF 3 Yrs & greater 07/31/2016   . Tdap 06/30/2014      Health Maintenance Due   Topic Date Due   . [facility] CARE PLAN LETTER  02/25/57   . Spirometry every Two Years  1955/11/21   . Shingrix Vaccine 50+ (1) 02/12/2007   . INFLUENZA VACCINE  05/19/2017   . PAP SMEAR  08/31/2017   . MAMMOGRAM  09/06/2017     Allergies   Allergen Reactions   . Latex Rash     Lab Results   Component Value Date    WBC 6.96 02/23/2017    HGB 14.6 02/23/2017    HCT 47.2 (H) 02/23/2017    PLT 308 02/23/2017    CHOL 204 (H) 10/04/2016    TRIG 116 10/04/2016    HDL 41 10/04/2016    LDL 140 (H) 10/04/2016    ALT 39 01/29/2017    AST  35 (H) 01/29/2017    NA 137 01/29/2017    K 4.2 01/29/2017    CL 102 01/29/2017    CREAT 0.8 01/29/2017    BUN 10.0 01/29/2017    CO2 26 01/29/2017    INR 1.0 02/22/2017    GLU 87 01/29/2017    HGBA1C 5.5 01/29/2017     No results found for: VITD, 25HYDROXYDTO    Review of Systems   Constitutional: Negative for fever and unexpected weight change.   HENT: Negative for congestion and postnasal drip.    Respiratory: Negative for cough, shortness of breath and wheezing.    Cardiovascular: Negative for chest pain and leg swelling.   Gastrointestinal: Negative for abdominal pain, constipation, diarrhea, nausea and vomiting.   Genitourinary: Negative for dysuria and frequency.   Psychiatric/Behavioral: Negative for dysphoric mood and sleep disturbance.     Physical Exam   Constitutional: She is oriented to person, place, and time. No distress.   Eyes: Conjunctivae are normal.   Neck: Normal range of motion. No thyromegaly present.  Cardiovascular: Normal rate, regular rhythm, normal heart sounds and intact distal pulses.  Exam reveals no gallop and no friction rub.    No murmur heard.  Pulmonary/Chest: Effort normal and breath sounds normal. She has no wheezes. She has no rales.   Abdominal: Soft. Bowel sounds are normal. She exhibits no distension and no mass. There is no tenderness.   Musculoskeletal: She exhibits no edema.   Lymphadenopathy:     She has no cervical adenopathy.   Neurological: She is alert and oriented to person, place, and time.   Skin: No rash noted.   Psychiatric: She has a normal mood and affect. Her behavior is normal.     HIgh BMI Follow Up   BMI Follow Up Care Plan Documented    ASSESSMENT/PLAN:  (See above for details)  There are no diagnoses linked to this encounter.    Continue current meds for stable conditions  Symptomatic treatment reviewed in detail    Risk & Benefits of any new medication(s) were explained to the patient who verbalized understanding & agreed to the treatment plan.      Call if symptoms persist, worsen, or change.  Call with updates/questions/concerns.

## 2017-07-17 ENCOUNTER — Ambulatory Visit (INDEPENDENT_AMBULATORY_CARE_PROVIDER_SITE_OTHER): Payer: No Typology Code available for payment source | Admitting: Internal Medicine

## 2017-07-20 ENCOUNTER — Other Ambulatory Visit (INDEPENDENT_AMBULATORY_CARE_PROVIDER_SITE_OTHER): Payer: Self-pay | Admitting: Internal Medicine

## 2017-07-20 ENCOUNTER — Encounter (INDEPENDENT_AMBULATORY_CARE_PROVIDER_SITE_OTHER): Payer: Self-pay

## 2017-07-20 ENCOUNTER — Telehealth (INDEPENDENT_AMBULATORY_CARE_PROVIDER_SITE_OTHER): Payer: Self-pay

## 2017-07-20 MED ORDER — LOSARTAN POTASSIUM-HCTZ 100-12.5 MG PO TABS
ORAL_TABLET | ORAL | 1 refills | Status: DC
Start: 2017-07-20 — End: 2018-01-10

## 2017-07-20 MED ORDER — ALPRAZOLAM 0.5 MG PO TABS
ORAL_TABLET | ORAL | 0 refills | Status: DC
Start: 2017-07-20 — End: 2017-10-25

## 2017-07-20 NOTE — Telephone Encounter (Signed)
[facility]

## 2017-07-20 NOTE — Progress Notes (Signed)
Chief Complaint   Patient presents with   . Establish Care      Patient is new to me   61 y.o.  female presents for Physical.    BP 113/77   Pulse 92   Temp 97.9 F (36.6 C)   Ht 1.524 m (5')   Wt 82.6 kg (182 lb 3.2 oz)   BMI 35.58 kg/m       PROBLEM LIST:  HTN--on losartan-hctz 100-12.5mg ; compliant with meds; no se's.  Good control  BP Readings from Last 3 Encounters:   07/23/17 113/77   03/02/17 139/76   02/22/17 97/65   Lumbar disc disease with chronic pain--on tramadol 50mg  q8 prn, gabapentin 100mg  tid; had taken vicodin, percocet prior; sees pain management.    OA--bilat knees, hips, back (LLE sciatica)  Smoker--not ready to quit right now but may try patches.  GERD--on mg prn?; no se's.  Good control   Anxiety--on xanax 0.5mg  qhs;  Tried melatonin (woke groggy). Tried prozac (self d/c'd).  Discussed trying other options for both sleep and anxiety if needed.  Chronic insomnia--on xanax 0.5mg  qhs; trial of ambien 10mg  instead.  Eczema--on betamethasone cream prn  Carpal tunnel--bilat  H/o L knee replacement  Nl BMI--Body mass index is 35.58 kg/m. Discussed working on diet/exercise.  Wt Readings from Last 3 Encounters:   07/23/17 82.6 kg (182 lb 3.2 oz)   03/02/17 85.5 kg (188 lb 6.4 oz)   02/22/17 85.7 kg (189 lb)     SOCIAL HISTORY: Works with mentally handicapped.  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  Smoker (1/4ppd x 30+yrs), no etoh    HEALTH MAINTENANCE:  Shingrix: discussed  Prevnar:  Pneumovax:  Hep C: No results found for: HCVAB  EKG: 6/18  ASCVD Risk (07/23/2017):  ASA:  BMD:   Ca/VitD:    Immunization History   Administered Date(s) Administered   . Influenza (Im) Preserved TRIVALENT VACCINE 06/30/2014   . Influenza quadrivalent (IM) 3 Yrs & greater 07/15/2015   . Influenza quadrivalent (IM) PF 3 Yrs & greater 07/31/2016, 07/23/2017   . Tdap 06/30/2014      Health Maintenance Due   Topic Date Due   . [facility] CARE PLAN LETTER  04/08/57   . Shingrix Vaccine 50+ (1) 02/12/2007   . PAP  SMEAR  08/31/2017   . MAMMOGRAM  09/06/2017     Allergies   Allergen Reactions   . Latex Rash     Lab Results   Component Value Date    WBC 6.96 02/23/2017    HGB 14.6 02/23/2017    HCT 47.2 (H) 02/23/2017    PLT 308 02/23/2017    CHOL 204 (H) 10/04/2016    TRIG 116 10/04/2016    HDL 41 10/04/2016    LDL 140 (H) 10/04/2016    ALT 39 01/29/2017    AST 35 (H) 01/29/2017    NA 137 01/29/2017    K 4.2 01/29/2017    CL 102 01/29/2017    CREAT 0.8 01/29/2017    BUN 10.0 01/29/2017    CO2 26 01/29/2017    INR 1.0 02/22/2017    GLU 87 01/29/2017    HGBA1C 5.5 01/29/2017     No results found for: VITD, 25HYDROXYDTO    Review of Systems   Constitutional: Negative for fever and unexpected weight change.   HENT: Negative for congestion and postnasal drip.    Respiratory: Negative for cough, shortness of breath and wheezing.    Cardiovascular:  Negative for chest pain and leg swelling.   Gastrointestinal: Negative for abdominal pain, constipation, diarrhea, nausea and vomiting.   Genitourinary: Negative for dysuria and frequency.   Psychiatric/Behavioral: Positive for sleep disturbance. Negative for dysphoric mood. The patient is nervous/anxious.      Physical Exam   Constitutional: She is oriented to person, place, and time. No distress.   Eyes: Conjunctivae are normal.   Neck: Normal range of motion. No thyromegaly present.   Cardiovascular: Normal rate, regular rhythm, normal heart sounds and intact distal pulses.  Exam reveals no gallop and no friction rub.    No murmur heard.  Pulmonary/Chest: Effort normal and breath sounds normal. She has no wheezes. She has no rales.   Abdominal: Soft. Bowel sounds are normal. She exhibits no distension and no mass. There is no tenderness.   Musculoskeletal: She exhibits no edema.   Lymphadenopathy:     She has no cervical adenopathy.   Neurological: She is alert and oriented to person, place, and time.   Skin: No rash noted.   Psychiatric: She has a normal mood and affect. Her behavior  is normal.     HIgh BMI Follow Up   BMI Follow Up Care Plan Documented    ASSESSMENT/PLAN:  (See above for details)  Mark was seen today for establish care.    Diagnoses and all orders for this visit:    Annual physical exam    Need for vaccination  -     Flu vacc QUAD PRES FREE 3 YRS & GREATER    Chronic insomnia  -     zolpidem (AMBIEN) 10 MG tablet; Take 1 tablet (10 mg total) by mouth nightly as needed for Sleep.        Continue current meds for stable conditions  Symptomatic treatment reviewed in detail    Risk & Benefits of any new medication(s) were explained to the patient who verbalized understanding & agreed to the treatment plan.     Call if symptoms persist, worsen, or change.  Call with updates/questions/concerns.

## 2017-07-23 ENCOUNTER — Encounter (INDEPENDENT_AMBULATORY_CARE_PROVIDER_SITE_OTHER): Payer: Self-pay | Admitting: Internal Medicine

## 2017-07-23 ENCOUNTER — Ambulatory Visit (INDEPENDENT_AMBULATORY_CARE_PROVIDER_SITE_OTHER): Payer: No Typology Code available for payment source | Admitting: Internal Medicine

## 2017-07-23 VITALS — BP 113/77 | HR 92 | Temp 97.9°F | Ht 60.0 in | Wt 182.2 lb

## 2017-07-23 DIAGNOSIS — Z23 Encounter for immunization: Secondary | ICD-10-CM

## 2017-07-23 DIAGNOSIS — Z0000 Encounter for general adult medical examination without abnormal findings: Secondary | ICD-10-CM

## 2017-07-23 DIAGNOSIS — F5104 Psychophysiologic insomnia: Secondary | ICD-10-CM

## 2017-07-23 MED ORDER — ZOLPIDEM TARTRATE 10 MG PO TABS
10.0000 mg | ORAL_TABLET | Freq: Every evening | ORAL | 3 refills | Status: DC | PRN
Start: 2017-07-23 — End: 2017-10-04

## 2017-07-24 ENCOUNTER — Other Ambulatory Visit (INDEPENDENT_AMBULATORY_CARE_PROVIDER_SITE_OTHER): Payer: Self-pay | Admitting: Internal Medicine

## 2017-07-24 NOTE — Telephone Encounter (Signed)
Dr. Milas Gain, you saw this pt yesterday and prescribed Ambien instead of xanax, so I refused the refill request for Xanax, FYI.

## 2017-10-04 ENCOUNTER — Emergency Department
Admission: EM | Admit: 2017-10-04 | Discharge: 2017-10-04 | Disposition: A | Discharge status: Home / Self Care | Payer: No Typology Code available for payment source | Attending: Internal Medicine | Admitting: Internal Medicine

## 2017-10-04 DIAGNOSIS — Z91040 Latex allergy status: Secondary | ICD-10-CM | POA: Insufficient documentation

## 2017-10-04 DIAGNOSIS — F17210 Nicotine dependence, cigarettes, uncomplicated: Secondary | ICD-10-CM | POA: Insufficient documentation

## 2017-10-04 DIAGNOSIS — I10 Essential (primary) hypertension: Secondary | ICD-10-CM | POA: Insufficient documentation

## 2017-10-04 DIAGNOSIS — J019 Acute sinusitis, unspecified: Secondary | ICD-10-CM | POA: Insufficient documentation

## 2017-10-04 DIAGNOSIS — L309 Dermatitis, unspecified: Secondary | ICD-10-CM | POA: Insufficient documentation

## 2017-10-04 DIAGNOSIS — Z79899 Other long term (current) drug therapy: Secondary | ICD-10-CM | POA: Insufficient documentation

## 2017-10-04 DIAGNOSIS — K219 Gastro-esophageal reflux disease without esophagitis: Secondary | ICD-10-CM | POA: Insufficient documentation

## 2017-10-04 MED ORDER — AMOXICILLIN-POT CLAVULANATE 875-125 MG PO TABS
1.0000 | ORAL_TABLET | Freq: Two times a day (BID) | ORAL | 0 refills | Status: DC
Start: 2017-10-04 — End: 2017-11-27

## 2017-10-04 NOTE — Discharge Instructions (Signed)
Sinusitis     You have been seen for a sinus infection.     Sinus infections are common. They often happen after people have a virus or a common cold.     Symptoms are: Pain in the face, green or yellow mucous from the nose, fever (temperature higher than 100.4ºF / 38ºC) and chills. There may also be a feeling of fullness or pressure in the face.     Decongestants may be used to help the sinuses drain. Sometimes a steroid spray is used.     You may need antibiotics for the infection. Not all cases of sinusitis need antibiotics. Viruses cause most sinusitis. Antibiotics do not work on viruses. Sometimes sinusitis and be caused by bacteria. These cases usually get better with medicine to help the sinuses drain. Antibiotics should be given only to patients who have symptoms for more than 2 weeks and if they have fever, severe sinus pain and pus mixed in with the mucus.     YOU SHOULD SEEK MEDICAL ATTENTION IMMEDIATELY, EITHER HERE OR AT THE NEAREST EMERGENCY DEPARTMENT, IF ANY OF THE FOLLOWING OCCURS:  · You do not get better with treatment.  · You have severe headaches and high fever (temperature higher than 100.4ºF / 38ºC) or any confusion.  · You have worse pain in the face. Your face gets more swollen.

## 2017-10-04 NOTE — ED Provider Notes (Signed)
[facility]      Visit date: 10/04/2017      CLINICAL SUMMARY          Diagnosis:    .     Final diagnoses:   Acute non-recurrent sinusitis, unspecified location         MDM Notes:    Increasing sinus sxs without fever        Disposition:         Discharge         Discharge Prescriptions     Medication Sig Dispense Auth. Provider    amoxicillin-clavulanate (AUGMENTIN) 875-125 MG per tablet Take 1 tablet by mouth 2 (two) times daily. 20 tablet Georgiann Cocker, MD                     CLINICAL INFORMATION        HPI:      Chief Complaint: Flu like symptoms  .    [name] is a 62 y.o. female who presents with increasing head congestion sinus sxs has had congestion for a while. + cough without sob, cp, wheezing. No fever noted. Mild upset stomach with nausea, few loose stools, no vomiting.  No ha except over eyes with sinus    History obtained from: Patient          ROS:      Review of Systems   Constitutional: Negative for fever.   HENT: Positive for congestion and rhinorrhea.    Respiratory: Positive for cough.    All other systems reviewed and are negative.        Physical Exam:      Pulse (!) 113  BP 127/73  Resp 15  SpO2 97 %  Temp 97.7 F (36.5 C)    Physical Exam   Constitutional: She is oriented to person, place, and time. She appears well-developed and well-nourished.   HENT:   Head: Normocephalic and atraumatic.   Mouth/Throat: Oropharynx is clear and moist.   Tm clear  Nasal congestion  Sinus tenderness     Eyes: Pupils are equal, round, and reactive to light. Conjunctivae and EOM are normal. Right eye exhibits no discharge. Left eye exhibits no discharge.   Neck: Neck supple. No tracheal deviation present.   Cardiovascular: Regular rhythm, normal heart sounds and intact distal pulses.  Exam reveals no gallop and no friction rub.    No murmur heard.  Hr 94   Pulmonary/Chest: Effort normal and breath sounds normal.   Abdominal: Soft.  She exhibits no distension. There is no tenderness.   No hsmegaly   Musculoskeletal: Normal range of motion. She exhibits no edema or tenderness.   Lymphadenopathy:     She has no cervical adenopathy.   Neurological: She is alert and oriented to person, place, and time. She exhibits normal muscle tone. Coordination normal.   Skin: Skin is warm and dry. Capillary refill takes less than 2 seconds. No rash noted.   Psychiatric: She has a normal mood and affect. Her behavior is normal. Thought content normal.   Nursing note and vitals reviewed.              PAST HISTORY        Primary Care Provider: [name], MD        PMH/PSH:    .     Past Medical History:   Diagnosis Date   . Abnormal vision     glasses   .  Anxiety     uses xanax @ bedtime   . Arthritis     bilateral knees/OA hips/back   . Carpal tunnel syndrome on both sides     stiffness noted    . Difficulty walking     limps   . Eczema     has prescription cream   . Fracture     finger   . Gastroesophageal reflux disease     heartburn recently diet related/some problem swallowing   . Hypertensive disorder     stable meds   . Lower back pain     sciatica radiates down LLE: Pain range= 0-10   . Neck pain     related to lower back but significant at this time    . Neuropathy     numbness upon awakening from low back radiating down LLE .Marland Kitchen. diminishes after awhile after moving around   . Pneumonia     bronchitis in past   . 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        She has a past surgical history that includes Ganglion cyst excision (Right, 2007); Knee arthroscopy (Right, 09/24/2014); Knee arthroscopy (Left, 03/2015); Colonoscopy (2016); Induced abortion; Vaginal delivery; ARTHROPLASTY, KNEE, TOTAL (Left, 12/14/2015); Tonsillectomy; REPAIR, UPPER EXTEMITY TENDON (Right, 03/02/2017); and ENDOSCOPIC CARPAL TUNNEL RELEASE (Right, 03/02/2017).      Social/Family History:      She reports that she has been smoking Cigarettes.  She has a 7.50 pack-year smoking  history. She has never used smokeless tobacco. She reports that she does not drink alcohol or use drugs.    Family History   Problem Relation Age of Onset   . Hypertension Mother    . Stroke Mother    . Hypertension Father    . Kidney disease Brother          Listed Medications on Arrival:    .     Home Medications     Med List Status:  Complete Set By: Phylis Bougie, RN at 10/04/2017  9:55 AM                ALPRAZolam (XANAX) 0.5 MG tablet     TAKE 1 TABLET BY MOUTH NIGHTLY AS NEEDED     betamethasone dipropionate (DIPROLENE) 0.05 % cream     Apply topically 2 (two) times daily as needed.     DENTA 5000 PLUS 1.1 % Cream     BRUSH FOR 2 MINS TWICE DAILY.EXPECTORATE, DO NOT RINSE. NO EAT/DRINK X30MINS     losartan-hydrochlorothiazide (HYZAAR) 100-12.5 MG per tablet     TAKE 1 TABLET BY MOUTH DAILY     meclizine (ANTIVERT) 25 MG tablet     Take 1 tablet (25 mg total) by mouth 3 (three) times daily as needed for Dizziness.     traMADol (ULTRAM) 50 MG tablet     TAKE 1 TABLET BY MOUTH EVERY 8 HOURS AS NEEDED FOR PAIN                              Allergies: She is allergic to latex.            VISIT INFORMATION        Clinical Course in the ED:                   Medications Given in the ED:    .  ED Medication Orders     None            Procedures:      Procedures      Interpretations:      O2 sat-           saturation: 97 %; Oxygen use: room air; Interpretation: Normal                   RESULTS        Recent Lab Results:      Results     ** No results found for the last 24 hours. **              Radiology Results:      No orders to display               Scribe Attestation:      No scribe involved in the care of this patient                            Georgiann Cocker, MD  10/04/17 1729

## 2017-10-25 ENCOUNTER — Other Ambulatory Visit (INDEPENDENT_AMBULATORY_CARE_PROVIDER_SITE_OTHER): Payer: Self-pay | Admitting: Internal Medicine

## 2017-10-25 MED ORDER — ALPRAZOLAM 0.5 MG PO TABS
ORAL_TABLET | ORAL | 0 refills | Status: DC
Start: 2017-10-25 — End: 2017-12-20

## 2017-10-25 NOTE — Telephone Encounter (Signed)
Refill encounter forwarded to pcp.

## 2017-10-25 NOTE — Telephone Encounter (Signed)
Name, strength, directions of requested refill(s):    ALPRAZolam (XANAX) 0.5 MG tablet (Order [id number])     **Please note that patient is not feeling well and is completely out of the medication. Please refill as soon as possible. Thank you.    Pharmacy to send refill to or patient to pick up rx from office (mark requested pharmacy in [place]):    [id number] - [place], [place] - [address]  [address] [place] [address]  Phone: [phone] Fax: [phone]    Please mark "X" next to the preferred call back number:    Mobile:   Telephone Information:   Mobile [phone]       Home: [phone]    Work: [phone]          Next visit: Visit date not found

## 2017-10-25 NOTE — Telephone Encounter (Signed)
Forwarded to Dr. Vials nurse.

## 2017-10-27 ENCOUNTER — Encounter (INDEPENDENT_AMBULATORY_CARE_PROVIDER_SITE_OTHER): Payer: Self-pay

## 2017-11-15 ENCOUNTER — Telehealth (INDEPENDENT_AMBULATORY_CARE_PROVIDER_SITE_OTHER): Payer: Self-pay | Admitting: Internal Medicine

## 2017-11-15 DIAGNOSIS — Z1231 Encounter for screening mammogram for malignant neoplasm of breast: Secondary | ICD-10-CM

## 2017-11-15 NOTE — Telephone Encounter (Signed)
Can you put an order in for mammogram? And would you like for pt to come in for a visit?

## 2017-11-15 NOTE — Telephone Encounter (Signed)
Forwarded to Dr. Vials nurse.

## 2017-11-15 NOTE — Telephone Encounter (Signed)
Patient called requesting to know if she is due for a mammogram if so can she have a mammogram order. Patient also wanted to ask how can she go about getting a cancer screening done. Patient has been experiencing back pain and is concerned. Patient can be reached at [phone].

## 2017-11-16 NOTE — Telephone Encounter (Signed)
mammo order is in.  We can mail or fax.  She did colonoscopy.  She can make sure pap is up to date.  Not sure what other cancer screeniing she's referring to

## 2017-11-19 ENCOUNTER — Telehealth (INDEPENDENT_AMBULATORY_CARE_PROVIDER_SITE_OTHER): Payer: Self-pay | Admitting: Internal Medicine

## 2017-11-19 ENCOUNTER — Other Ambulatory Visit (INDEPENDENT_AMBULATORY_CARE_PROVIDER_SITE_OTHER): Payer: Self-pay

## 2017-11-19 NOTE — Progress Notes (Signed)
11/19/17 1615   General Assessment   Assessment completed with patient   Cognitive Issues No   Patient lives with children   Capacity/Guardianship Issue No   Pain Yes   Severity of pain Moderate   Housing single family/private residence   Support system children   Current home care services No   Limited Mobility No   Transportation issues No   Medication  No   Housing concerns No   Visual Impairment No   Hearing Impaired No   Religious or cultural barriers to wellness No   Coping Skills (Having difficulty coping with the death of her sister.)   Diet - Type Regular   Exercise Yes   Exercise > type of exercise Walks a lot at work.

## 2017-11-19 NOTE — Progress Notes (Signed)
11/19/17 1543   Pt Outreach/Care Plan Metrics   [employer] Grouping for Outreach [employer] [facility]   Care Plan  Other   Care Plan Progress Completed   Outreach Status for Metrics listing Spoke with Member/Care Giver

## 2017-11-19 NOTE — Progress Notes (Signed)
11/19/17 1543   Patient Interventions This Encounter   Telephone Interventions 1   MyChart Interventions 0   In-Person Intervention 0   Other Intervention 2   Intervention Gilman Buttner this encounter 3

## 2017-11-19 NOTE — Progress Notes (Signed)
11/19/17 1410   Pt Outreach/Care Plan Metrics   Payor Grouping for [employer] [facility]   Outreach Status for Metrics listing Left Voicemail 1

## 2017-11-19 NOTE — Progress Notes (Signed)
Care Plan      Name: [name]     MRN: [id number]       DOB:   23-Dec-1955     PCP: [name], MD      Advance Directives:  N     Communication Preferences: Telephone  [phone]    Diagnosis:  Patient Active Problem List   Diagnosis   . Neck pain   . Low back pain   . Essential hypertension   . Primary osteoarthritis of both knees   . Anxiety   . Left knee DJD   . Dizziness       Patient Care Team:  [name], MD as PCP - General (Internal Medicine)  Cynda Familia, MD as Consulting Physician (Orthopaedic Surgery)    Health Maintenance   Topic Date Due   . [facility] CARE PLAN LETTER  1956-03-03   . Shingrix Vaccine 50+ (1) 02/12/2007   . DEPRESSION SCREENING  07/31/2017   . PAP SMEAR  08/31/2017   . MAMMOGRAM  09/06/2017   . Annual Exam  07/23/2018   . COLONOSCOPY TEN YEARS  07/21/2024   . INFLUENZA VACCINE  Completed         Immunization History   Administered Date(s) Administered   . Influenza (Im) Preserved TRIVALENT VACCINE 06/30/2014   . Influenza quadrivalent (IM) 3 Yrs & greater 07/15/2015   . Influenza quadrivalent (IM) PF 3 Yrs & greater 07/31/2016, 07/23/2017   . Tdap 06/30/2014            Allergies   Allergen Reactions   . Latex Rash          Current Outpatient Prescriptions   Medication Sig Dispense Refill   . ALPRAZolam (XANAX) 0.5 MG tablet TAKE 1 TABLET BY MOUTH NIGHTLY AS NEEDED 90 tablet 0   . amoxicillin-clavulanate (AUGMENTIN) 875-125 MG per tablet Take 1 tablet by mouth 2 (two) times daily. 20 tablet 0   . betamethasone dipropionate (DIPROLENE) 0.05 % cream Apply topically 2 (two) times daily as needed. 15 g 1   . DENTA 5000 PLUS 1.1 % Cream BRUSH FOR 2 MINS TWICE DAILY.EXPECTORATE, DO NOT RINSE. NO EAT/DRINK X30MINS  6   . losartan-hydrochlorothiazide (HYZAAR) 100-12.5 MG per tablet TAKE 1 TABLET BY MOUTH DAILY 90 tablet 1   . meclizine (ANTIVERT) 25 MG tablet Take 1 tablet (25 mg total) by mouth 3 (three) times daily as needed  for Dizziness. 30 tablet 0   . traMADol (ULTRAM) 50 MG tablet TAKE 1 TABLET BY MOUTH EVERY 8 HOURS AS NEEDED FOR PAIN 270 tablet 0     No current facility-administered medications for this visit.        Goals:   To find a Paramedic.      Education provided to meet goals:  The following education material was mailed to patient's confirmed home address:  Patient Welcome Letter with business card  [employer] Care Management Program Pamphlet    Follow-Up: Will follow up in about a month.  PCP appointment - 12/20/2017.         Incoming phone call received from patient in response to voicemail message left for her earlier this afternoon. Explained role of nurse navigator and case management services to patient. Patient confirms primary care physician. States that she needs some help finding a Paramedic. Reports that her sister passed away about 3 weeks ago from cancer, it happened very quickly - almost overnight, and she is having a hard  time coping. 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 She does not feel like getting out of bed a lot of times and she feels like it affects her work sometimes. States that she has to force herself to get out of bed, and all she does is go to work and then sleep. She wonders if she may be depressed, she has a past history of depression but states that this is different. Explained to patient that wanting to sleep all the time and not being interested in other things is a sign of depression. Reports that she also has problems with knee pain and back pain, but she does not want to take pain medication anymore. States she has not taken pain medication since Dr. Jerolyn Center left. Denies any suicidal or homicidal ideations. States that she lives with her 2 adult children. Offered my condolences and encouragement and assured her that she is doing the right thing asking for help.     Reports that she was on medication  for depression years ago and wonders if going back on it will help. Discussed getting her an appointment to see her PCP to discuss this. Patient states that she has already scheduled an appointment for April 4th. Informed her that I will refer her to [facility] Social Worker to assist her in finding a Paramedic. Will follow up in about 4 weeks. Patient encouraged to call if I can be of any assistance to her.         Karolee Stamps, BSN, RN-BC,  [facility]    T [phone] M [phone]

## 2017-11-19 NOTE — Telephone Encounter (Signed)
Social worker sent me a note that she is having a hard time after her sister's death recently.  She has appt in April but if possible let's get her in sooner

## 2017-11-19 NOTE — Telephone Encounter (Signed)
Mailed mammogram order to home address and lvm to call back.

## 2017-11-19 NOTE — Progress Notes (Signed)
11/19/17 1543   [facility] Nurse Navigator Follow Up Report Items   Follow Up For: Care Plan Update

## 2017-11-19 NOTE — Progress Notes (Signed)
Nurse Navigator placed telephone call to patient to verify patient's primary care physician and discuss possible care coordination needs with patient.  No answer at contact telephone number, left voicemail message to call back.      Lilliauna Van, BSN, RN-BC, ONC  Nurse Navigator  [facility]  T [phone] M [phone]

## 2017-11-19 NOTE — Progress Notes (Signed)
11/19/17 1542   Care Plan/ Care Coordination   Enrolled in Care Mgmt  Enrolled   Patient Enrolled in Care Management by: [facility]

## 2017-11-20 ENCOUNTER — Telehealth (INDEPENDENT_AMBULATORY_CARE_PROVIDER_SITE_OTHER): Payer: Self-pay

## 2017-11-20 DIAGNOSIS — Z124 Encounter for screening for malignant neoplasm of cervix: Secondary | ICD-10-CM

## 2017-11-20 NOTE — Telephone Encounter (Signed)
LVM to call back to see if pt can come in for a earlier appt.

## 2017-11-20 NOTE — Telephone Encounter (Signed)
Mailed referral to home address

## 2017-11-20 NOTE — Telephone Encounter (Signed)
Pt needs an order for a pap smear also but she is coming in to see you next Tuesday.

## 2017-11-20 NOTE — Addendum Note (Signed)
Addended by: [name]. on: 11/20/2017 01:03 PM     Modules accepted: Orders

## 2017-11-20 NOTE — Telephone Encounter (Signed)
Ok I put in referral.    Dr. [facility]  [address]  [address]  [place], [place] [address]  [phone]

## 2017-11-21 DIAGNOSIS — M519 Unspecified thoracic, thoracolumbar and lumbosacral intervertebral disc disorder: Secondary | ICD-10-CM | POA: Insufficient documentation

## 2017-11-21 NOTE — Progress Notes (Signed)
Chief Complaint   Patient presents with   . Annual Exam     pt is fasting      62 y.o.  female presents for f/u of chronic conditions including:  L mid-abd pain intermittent for months.  No bowel changes.  No gerd.  Maybe worse over time.  More discomfort than pain.  Drinks plenty of water.  BP 130/74 (BP Site: Left arm, Patient Position: Sitting)   Pulse (!) 104   Temp 98.1 F (36.7 C)   Ht 1.58 m (5' 2.21")   Wt 81.5 kg (179 lb 9.6 oz)   BMI 32.63 kg/m       PROBLEM LIST:  HTN--on losartan-hctz 100-12.5mg ; compliant with meds; no se's.  Good control  BP Readings from Last 3 Encounters:   11/27/17 130/74   10/04/17 127/73   07/23/17 113/77   Lumbar disc disease with chronic pain--on tramadol 50mg  q8 prn, gabapentin 100mg  tid; had taken vicodin, percocet prior; sees pain management.    OA--bilat knees, hips, back (LLE sciatica)  Smoker--trying patches.  GERD--on mg prn?; no se's.  Good control   Anxiety--on xanax 0.5mg  qhs and trying to wean off;  Tried melatonin (woke groggy). Tried prozac; starting therapy   Chronic insomnia--on xanax 0.5mg  qhs; trial of ambien 10mg  instead.  Eczema--on betamethasone cream prn  Carpal tunnel--bilat  H/o L knee replacement  Obesity--Body mass index is 32.63 kg/m. Discussed working on diet/exercise.  Wt Readings from Last 3 Encounters:   11/27/17 81.5 kg (179 lb 9.6 oz)   10/04/17 81.6 kg (180 lb)   07/23/17 82.6 kg (182 lb 3.2 oz)     SOCIAL HISTORY: Works with mentally handicapped.  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  Smoker (1/4ppd x 30+yrs), no etoh    HEALTH MAINTENANCE:  Shingrix: discussed  Prevnar:  Pneumovax:  Hep C: No results found for: HCVAB  EKG: 6/18  ASCVD Risk (11/27/2017):  ASA:  BMD:   Ca/VitD:    Immunization History   Administered Date(s) Administered   . Influenza (Im) Preserved TRIVALENT VACCINE 06/30/2014   . Influenza quadrivalent (IM) 3 Yrs & greater 07/15/2015   . Influenza quadrivalent (IM) PF 3 Yrs & greater 07/31/2016, 07/23/2017    . Tdap 06/30/2014      Health Maintenance Due   Topic Date Due   . [facility] CARE PLAN LETTER  August 28, 1956   . Shingrix Vaccine 50+ (1) 02/12/2007   . PAP SMEAR  08/31/2017   . MAMMOGRAM  09/06/2017     Allergies   Allergen Reactions   . Latex Rash     Lab Results   Component Value Date    WBC 6.96 02/23/2017    HGB 14.6 02/23/2017    HCT 47.2 (H) 02/23/2017    PLT 308 02/23/2017    CHOL 204 (H) 10/04/2016    TRIG 116 10/04/2016    HDL 41 10/04/2016    LDL 140 (H) 10/04/2016    ALT 39 01/29/2017    AST 35 (H) 01/29/2017    NA 137 01/29/2017    K 4.2 01/29/2017    CL 102 01/29/2017    CREAT 0.8 01/29/2017    BUN 10.0 01/29/2017    CO2 26 01/29/2017    INR 1.0 02/22/2017    GLU 87 01/29/2017    HGBA1C 5.5 01/29/2017     No results found for: VITD, 25HYDROXYDTO    Review of Systems   Constitutional: Negative for fever and unexpected weight change.  HENT: Negative for congestion and postnasal drip.    Respiratory: Negative for cough, shortness of breath and wheezing.    Cardiovascular: Negative for chest pain and leg swelling.   Gastrointestinal: Negative for abdominal pain, constipation, diarrhea, nausea and vomiting.   Genitourinary: Negative for dysuria and frequency.   Psychiatric/Behavioral: Positive for sleep disturbance. Negative for dysphoric mood. The patient is nervous/anxious.      Physical Exam   Constitutional: She is oriented to person, place, and time. No distress.   Eyes: Conjunctivae are normal.   Neck: Normal range of motion. No thyromegaly present.   Cardiovascular: Normal rate, regular rhythm, normal heart sounds and intact distal pulses.  Exam reveals no gallop and no friction rub.    No murmur heard.  Pulmonary/Chest: Effort normal and breath sounds normal. She has no wheezes. She has no rales.   Abdominal: Soft. Bowel sounds are normal. She exhibits no distension and no mass. There is no tenderness.   Musculoskeletal: She exhibits no edema.   Lymphadenopathy:     She has no cervical adenopathy.    Neurological: She is alert and oriented to person, place, and time.   Skin: No rash noted.   Psychiatric: She has a normal mood and affect. Her behavior is normal.     HIgh BMI Follow Up   BMI Follow Up Care Plan Documented    ASSESSMENT/PLAN:  (See above for details)  Renai was seen today for annual exam.    Diagnoses and all orders for this visit:    Encounter for screening mammogram for breast cancer    Osteoporosis screening    Essential hypertension    Anxiety    Lumbar disc disease    Other orders  -     Mammo Digital Screening Bilateral W Cad  -     Dxa Bone Density Axial Skeleton        Continue current meds for stable conditions  Symptomatic treatment reviewed in detail    Risk & Benefits of any new medication(s) were explained to the patient who verbalized understanding & agreed to the treatment plan.     Call if symptoms persist, worsen, or change.  Call with updates/questions/concerns.

## 2017-11-24 ENCOUNTER — Encounter (INDEPENDENT_AMBULATORY_CARE_PROVIDER_SITE_OTHER): Payer: Self-pay

## 2017-11-26 ENCOUNTER — Other Ambulatory Visit (INDEPENDENT_AMBULATORY_CARE_PROVIDER_SITE_OTHER): Payer: Self-pay

## 2017-11-26 NOTE — Progress Notes (Signed)
Psychosocial Assessment        Client: [name]    Date of Birth: 04/20/1956      Presenting Problem:     [name] is a 61 year old female, referred to the [facility] Social Worker for assistance in accessing therapy. 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 Ms. [name] sister recently passed away from Stage 4 cancer, which has had an emotional impact on her. She stated that she is "surrounded by people, but sometimes feel alone." She has a history of depression and disclosed that when she is not working, she remains in her room. In addition to her job, she has additional responsibilities in attending to her father's needs; such as taking him to appointments and shopping for him.    Social History/Family History:   Ms. Stefano is the mother of 6 children, one of which passed away. Ms. Eakes' mother passed away 11 years ago, and her father is 19 years old. Ms. Storck stated that she has one sister who is alive, however they do not communicate.       Significant relationships:  Ms. Digiulio' significant relationships consists of her father and son.       Living Situation:   Ms. Longhi resides with her son and father.       Medical History: (illness, major surgeries, allergies, medications)     Past Medical History:   Diagnosis Date   . Abnormal vision     glasses   . Anxiety     uses xanax @ bedtime   . Arthritis     bilateral knees/OA hips/back   . Carpal tunnel syndrome on both sides     stiffness noted    . Difficulty walking     limps   . Eczema     has prescription cream   . Fracture     finger   . Gastroesophageal reflux disease     heartburn recently diet related/some problem swallowing   . Hypertensive disorder     stable meds   . Lower back pain     sciatica radiates down LLE: Pain range= 0-10   . Neck pain     related to lower back but significant at this time    . Neuropathy     numbness upon awakening from low back radiating down  LLE .Marland Kitchen. diminishes after awhile after moving around   . Pneumonia     bronchitis in past   . 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          Mental Health History:   Ms. Hitchman has a history of depression. She was previously on an anti-depressant medication, however has not taken it in years. She also has a diagnosis of anxiety and is on anti-anxiety medication. She explained that she is unable to sleep without the medication, as symptoms are significant.     Substance Abuse History: (substance, usage, frequency)    Ms. Lajuana Matte disclosed that she used substances over 20 years ago.       Financial History:     Ms. Giglia is currently employed and is content with her income. She denies having any financial hardship.         Legal History: (Legal problems, guardianship, power of attorney)   N/A        Suicide Assessment:    Thoughts of killing self: Denied SI. Ms. Stokely stated, "  no safety problem."    Current plan:     Previous attempts:     Means:     High risk behaviors:     *Self-injurious behaviors:     Is there a suicide risk? ___ Yes _x__ No      Homicide Assessment:    Thoughts of harming others:  ___ Yes __x_ No    If yes: Who:     Current plan:     Previous attempts:    Means:     High risk behaviors:       Coping Skills: Ms. Aldous has not been coping well with the death of her sister. She has insight into her need for therapy.         Interest/Hobbies: N/A        Spirituality/Religious Affiliations /Cultural Assessment: Ms. Krul believes on God.                                                  Advanced Directive: ___Yes   __x_ No    Functional Ability:    Identify areas of Concern  ___ None  ___ Safety   ___ Activities of daily living ___ Legal   ___ Work/school ___ Physical health   ___ Finances  ___ Housing   ___ Family relationships _x__ Behavioral health   ___ Social relationships ___ Other         Carmie End, MSW  Social Worker CM 1  [facility]   [address], [address]  [place], [place]  [address]  T [phone]   F [phone]  [email or web address]

## 2017-11-26 NOTE — Progress Notes (Signed)
SW called and left v/m with Dr. Dustin Folks [phone]) to inquire about availability, and to confirm that Ms. [name] insurance is accepted.     SW called [facility] regarding outpatient therapy. Rep stated that there is a 1 month waitlist for [place] locations [facility], [facility], and [place].)  Per rep. there are openings in [place].

## 2017-11-27 ENCOUNTER — Ambulatory Visit (INDEPENDENT_AMBULATORY_CARE_PROVIDER_SITE_OTHER): Payer: No Typology Code available for payment source | Admitting: Internal Medicine

## 2017-11-27 ENCOUNTER — Other Ambulatory Visit (FREE_STANDING_LABORATORY_FACILITY): Payer: No Typology Code available for payment source

## 2017-11-27 ENCOUNTER — Encounter (INDEPENDENT_AMBULATORY_CARE_PROVIDER_SITE_OTHER): Payer: Self-pay | Admitting: Internal Medicine

## 2017-11-27 ENCOUNTER — Other Ambulatory Visit (INDEPENDENT_AMBULATORY_CARE_PROVIDER_SITE_OTHER): Payer: Self-pay | Admitting: Internal Medicine

## 2017-11-27 ENCOUNTER — Telehealth (INDEPENDENT_AMBULATORY_CARE_PROVIDER_SITE_OTHER): Payer: Self-pay | Admitting: Internal Medicine

## 2017-11-27 VITALS — BP 130/74 | HR 104 | Temp 98.1°F | Ht 62.21 in | Wt 179.6 lb

## 2017-11-27 DIAGNOSIS — Z1231 Encounter for screening mammogram for malignant neoplasm of breast: Secondary | ICD-10-CM

## 2017-11-27 DIAGNOSIS — F419 Anxiety disorder, unspecified: Secondary | ICD-10-CM

## 2017-11-27 DIAGNOSIS — Z803 Family history of malignant neoplasm of breast: Secondary | ICD-10-CM

## 2017-11-27 DIAGNOSIS — I10 Essential (primary) hypertension: Secondary | ICD-10-CM

## 2017-11-27 DIAGNOSIS — Z0000 Encounter for general adult medical examination without abnormal findings: Secondary | ICD-10-CM

## 2017-11-27 DIAGNOSIS — Z13820 Encounter for screening for osteoporosis: Secondary | ICD-10-CM

## 2017-11-27 DIAGNOSIS — F17200 Nicotine dependence, unspecified, uncomplicated: Secondary | ICD-10-CM

## 2017-11-27 DIAGNOSIS — M519 Unspecified thoracic, thoracolumbar and lumbosacral intervertebral disc disorder: Secondary | ICD-10-CM

## 2017-11-27 LAB — LIPID PANEL
Cholesterol / HDL Ratio: 4.2
Cholesterol: 198 mg/dL (ref 0–199)
HDL: 47 mg/dL (ref 40–9999)
LDL Calculated: 129 mg/dL — ABNORMAL HIGH (ref 0–99)
Triglycerides: 110 mg/dL (ref 34–149)
VLDL Calculated: 22 mg/dL (ref 10–40)

## 2017-11-27 LAB — COMPREHENSIVE METABOLIC PANEL
ALT: 37 U/L (ref 0–55)
AST (SGOT): 33 U/L (ref 5–34)
Albumin/Globulin Ratio: 1.1 (ref 0.9–2.2)
Albumin: 4 g/dL (ref 3.5–5.0)
Alkaline Phosphatase: 93 U/L (ref 37–106)
BUN: 11 mg/dL (ref 7.0–19.0)
Bilirubin, Total: 0.5 mg/dL (ref 0.2–1.2)
CO2: 27 mEq/L (ref 21–29)
Calcium: 10.5 mg/dL (ref 8.5–10.5)
Chloride: 105 mEq/L (ref 100–111)
Creatinine: 0.8 mg/dL (ref 0.4–1.5)
Globulin: 3.6 g/dL (ref 2.0–3.7)
Glucose: 90 mg/dL (ref 70–100)
Potassium: 3.8 mEq/L (ref 3.5–5.1)
Protein, Total: 7.6 g/dL (ref 6.0–8.3)
Sodium: 141 mEq/L (ref 136–145)

## 2017-11-27 LAB — GFR: EGFR: >60

## 2017-11-27 LAB — CBC AND DIFFERENTIAL
Absolute NRBC: 0 10*3/uL (ref 0.00–0.00)
Basophils Absolute Automated: 0.04 10*3/uL (ref 0.00–0.08)
Basophils Automated: 0.7 %
Eosinophils Absolute Automated: 0.18 10*3/uL (ref 0.00–0.44)
Eosinophils Automated: 3 %
Hematocrit: 46.4 % — ABNORMAL HIGH (ref 34.7–43.7)
Hgb: 14.3 g/dL (ref 11.4–14.8)
Immature Granulocytes Absolute: 0 10*3/uL (ref 0.00–0.07)
Immature Granulocytes: 0 %
Lymphocytes Absolute Automated: 2.49 10*3/uL (ref 0.42–3.22)
Lymphocytes Automated: 42 %
MCH: 29.9 pg (ref 25.1–33.5)
MCHC: 30.8 g/dL — ABNORMAL LOW (ref 31.5–35.8)
MCV: 96.9 fL — ABNORMAL HIGH (ref 78.0–96.0)
MPV: 12.5 fL (ref 8.9–12.5)
Monocytes Absolute Automated: 0.51 10*3/uL (ref 0.21–0.85)
Monocytes: 8.6 %
Neutrophils Absolute: 2.71 10*3/uL (ref 1.10–6.33)
Neutrophils: 45.7 %
Nucleated RBC: 0 /100 WBC (ref 0.0–0.0)
Platelets: 293 10*3/uL (ref 142–346)
RBC: 4.79 10*6/uL (ref 3.90–5.10)
RDW: 13 % (ref 11–15)
WBC: 5.93 10*3/uL (ref 3.10–9.50)

## 2017-11-27 LAB — TSH: TSH: 0.6 u[IU]/mL (ref 0.35–4.94)

## 2017-11-27 LAB — HEMOLYSIS INDEX: Hemolysis Index: 27 — ABNORMAL HIGH (ref 0–18)

## 2017-11-27 MED ORDER — FLUOXETINE HCL 20 MG PO CAPS
20.0000 mg | ORAL_CAPSULE | Freq: Every day | ORAL | 1 refills | Status: DC
Start: 2017-11-27 — End: 2018-01-10

## 2017-11-27 MED ORDER — VARENICLINE TARTRATE 0.5 MG X 11 & 1 MG X 42 PO MISC
ORAL | 0 refills | Status: DC
Start: 2017-11-27 — End: 2017-12-18

## 2017-11-27 MED ORDER — VARENICLINE TARTRATE 1 MG PO TABS
1.0000 mg | ORAL_TABLET | Freq: Two times a day (BID) | ORAL | 1 refills | Status: DC
Start: 2017-11-27 — End: 2017-12-18

## 2017-11-27 NOTE — Telephone Encounter (Signed)
Pt hasn't had this done yet right?

## 2017-11-27 NOTE — Addendum Note (Signed)
Addended by: Marsh Dolly. on: 11/27/2017 02:10 PM     Modules accepted: Orders

## 2017-11-27 NOTE — Telephone Encounter (Signed)
Forwarded to Dr. Vials nurse.

## 2017-11-27 NOTE — Telephone Encounter (Signed)
Marchelle Folks with [facility] states that they need this pts notes for his Ct scan order faxed over to their facility.  Pt can be reached at [phone]

## 2017-11-27 NOTE — Telephone Encounter (Signed)
No just ordered today

## 2017-11-28 NOTE — Progress Notes (Signed)
Dear Ms. Schadt,   Your labs looked good overall.  You're not anemic. Your sugar was good.  Liver, kidney, thyroid numbers were normal.  Cholesterol was fine & improved several points from last time.     Please feel free to contact us with any questions or concerns.    Best,     Lennice Sites

## 2017-11-29 ENCOUNTER — Other Ambulatory Visit (INDEPENDENT_AMBULATORY_CARE_PROVIDER_SITE_OTHER): Payer: Self-pay

## 2017-11-29 NOTE — Progress Notes (Signed)
SW sent In Basket message to Dr. Milas Gain requesting that [facility] therapy referral be submitted.      SW contacted Clinical psychology services [phone]) regarding therapy services. Clinicians at the practice are not accepting new patients.       SW placed call to [facility], [place]. [phone]) and left v/m requesting return call regarding services.

## 2017-11-30 ENCOUNTER — Other Ambulatory Visit (INDEPENDENT_AMBULATORY_CARE_PROVIDER_SITE_OTHER): Payer: Self-pay

## 2017-11-30 ENCOUNTER — Other Ambulatory Visit (INDEPENDENT_AMBULATORY_CARE_PROVIDER_SITE_OTHER): Payer: Self-pay | Admitting: Internal Medicine

## 2017-11-30 ENCOUNTER — Telehealth (INDEPENDENT_AMBULATORY_CARE_PROVIDER_SITE_OTHER): Payer: Self-pay | Admitting: Internal Medicine

## 2017-11-30 DIAGNOSIS — F4321 Adjustment disorder with depressed mood: Secondary | ICD-10-CM

## 2017-11-30 NOTE — Telephone Encounter (Signed)
Sorry what do u mean?  I put in the referral but just want to make sure the pt has the info.Kathryn KitchenMarland Mueller

## 2017-11-30 NOTE — Telephone Encounter (Signed)
Aha awesome thx!

## 2017-11-30 NOTE — Progress Notes (Addendum)
SW received return call from [facility], [place]. Staff stated that [facility] insurance is out of network.    SW contacted [employer] Intake Line [phone]) to inquire if [facility] insurance is accepted. Rep. confirmed that [facility] is accepted for [facility] services.     SW contacted Ms. Pacitti and left v/m informing her of referral submitted by PCP for therapy. SW also provided number to call to schedule [phone]). SW provided call back number.      SW outreached again to Ms. Grays and spoke with her regarding referral for therapy. She confirmed understanding of information provided. SW will outreach in 1 week to follow up on outcome.     Carmie End, MSW  Social Worker CM 1  [facility]   [address]  [place], [place]  [address]  T [phone]   F [phone]  [email or web address]

## 2017-11-30 NOTE — Telephone Encounter (Signed)
[name], pls let her know I did put in therapy referral for her  She can call [phone] to make appointment.    Dustin Folks, at the [place] location.    (this was the msg I got:)    Good morning Dr. Milas Gain,     I am the Social Worker at [facility]. I am currently working with your patient, [name]. She would like to access therapy, due to the recent loss of her sister. She has a history of depression. Can you please submit a referral to Dr. Dustin Folks, at the [place] location. If he has no vacancies, please refer to Zara Council, LCSW. Thank you.

## 2017-11-30 NOTE — Telephone Encounter (Signed)
It appears that this was already taken care of in epic.

## 2017-11-30 NOTE — Telephone Encounter (Signed)
FWD: Carmie End at 11/30/2017 10:49 AM     Status: Signed      SW received return call from [employer], [place]. Staff stated that [facility] insurance is out of network.    SW contacted [employer] Intake Line [phone]) to inquire if [facility] insurance is accepted. Rep. confirmed that [facility] is accepted for [facility] services.     SW contacted Ms. Krull and left v/m informing her of referral submitted by PCP for therapy. SW also provided number to call to schedule [phone]). SW provided call back number.        Carmie End, MSW  Social Worker CM 1  [facility]   [address], [address]  [place], [place] [address]  T [phone]  F [phone]  [email or web address]

## 2017-12-06 ENCOUNTER — Telehealth (INDEPENDENT_AMBULATORY_CARE_PROVIDER_SITE_OTHER): Payer: Self-pay

## 2017-12-06 NOTE — Telephone Encounter (Signed)
Left a voicemail for the patient to schedule a therapy appointment with an [facility] therapist.

## 2017-12-13 ENCOUNTER — Encounter (INDEPENDENT_AMBULATORY_CARE_PROVIDER_SITE_OTHER): Payer: Self-pay

## 2017-12-13 NOTE — Progress Notes (Signed)
Submitted PA on covermymeds for [employer].

## 2017-12-18 ENCOUNTER — Emergency Department: Payer: No Typology Code available for payment source

## 2017-12-18 ENCOUNTER — Emergency Department
Admission: EM | Admit: 2017-12-18 | Discharge: 2017-12-18 | Disposition: A | Discharge status: Home / Self Care | Payer: No Typology Code available for payment source | Attending: Emergency Medicine | Admitting: Emergency Medicine

## 2017-12-18 DIAGNOSIS — R05 Cough: Secondary | ICD-10-CM | POA: Insufficient documentation

## 2017-12-18 DIAGNOSIS — F17210 Nicotine dependence, cigarettes, uncomplicated: Secondary | ICD-10-CM | POA: Insufficient documentation

## 2017-12-18 DIAGNOSIS — Z79899 Other long term (current) drug therapy: Secondary | ICD-10-CM | POA: Insufficient documentation

## 2017-12-18 DIAGNOSIS — R000 Tachycardia, unspecified: Secondary | ICD-10-CM | POA: Insufficient documentation

## 2017-12-18 DIAGNOSIS — R6889 Other general symptoms and signs: Secondary | ICD-10-CM

## 2017-12-18 DIAGNOSIS — I10 Essential (primary) hypertension: Secondary | ICD-10-CM | POA: Insufficient documentation

## 2017-12-18 MED ORDER — OSELTAMIVIR PHOSPHATE 75 MG PO CAPS
75.00 mg | ORAL_CAPSULE | Freq: Two times a day (BID) | ORAL | 0 refills | Status: AC
Start: 2017-12-18 — End: 2017-12-23

## 2017-12-18 MED ORDER — BENZONATATE 100 MG PO CAPS
100.00 mg | ORAL_CAPSULE | Freq: Three times a day (TID) | ORAL | 0 refills | Status: DC | PRN
Start: 2017-12-18 — End: 2018-01-10

## 2017-12-18 MED ORDER — OSELTAMIVIR PHOSPHATE 75 MG PO CAPS
75.00 mg | ORAL_CAPSULE | Freq: Once | ORAL | Status: AC
Start: 2017-12-18 — End: 2017-12-18
  Administered 2017-12-18: 16:00:00 75 mg via ORAL
  Filled 2017-12-18: qty 1

## 2017-12-18 NOTE — Discharge Instructions (Signed)
Flu-Like Illness  The flu swab was negative and chest x-ray, however, there is strong suspicion of flu. Treat fever with tylenol or ibuprofen. Take Tamiflu as prescribed. Take Tessalon Perles as needed for cough. Return for shortness of breath, vomiting, any concerning symptoms.  You have been diagnosed with a flu-like illness.    A flu-like illness is caused by a virus. A viral infection is different from a bacterial infection because it cannot be killed with antibiotics. Viral infections are much more common than bacterial infections. Viral infections cause conditions like common cold, bronchitis, mononucleosis (mono) and pneumonia.     Common symptoms of a flu-like illness are:   Fever (temperature higher than 100.72F or 38C) and chills.   Cough.   Sore throat.   Headache.   Nausea (sick to the stomach) or vomiting (throwing up).   Diarrhea.   Muscle pains (myalgias).    At this time, it doesn't seem your symptoms are caused by anything dangerous. You do not need to stay in the hospital. You do not need treatment with antibiotics.    Though we don't believe your condition is dangerous right now, it is important to be careful. Sometimes a problem that seems mild can become serious later. This is why it is very important that you return here or go to the nearest Emergency Department if you are not improving or your symptoms are getting worse.    Some things you can try at home to improve symptoms are:   Acetaminophen (Tylenol) or NSAIDS like ibuprofen (Motrin) or naproxen (Aleve).   Over-the-counter decongestants.   Over-the-counter cough medications.    YOU SHOULD SEEK MEDICAL ATTENTION IMMEDIATELY, EITHER HERE OR AT THE NEAREST EMERGENCY DEPARTMENT, IF ANY OF THE FOLLOWING OCCUR:     Your symptoms get better and then come back or get worse. This may mean a bacterial infection is forming.   You have chest pain or shortness of breath.   You can't keep fluids down or your vomit is dark  green.   You throw up blood or see blood in your stool. Blood might be bright red or dark red. It can also be black and look like tar.   You have a severe headache or notice you cannot bend your neck.    If you can't follow up with your doctor, or if at any time you feel you need to be rechecked or seen again, come back here or go to the nearest emergency department.

## 2017-12-18 NOTE — ED Provider Notes (Signed)
EMERGENCY DEPARTMENT HISTORY AND PHYSICAL EXAM    Date Time: 12/18/17 1:49 PM  Patient Name: [name]  Attending Physician: Merceda Elks MD.               History of Presenting Illness       Chief Complaint:   Chief Complaint   Patient presents with   . Cough       [name] is a 62 y.o. female who presents with cough, body aches, chills x 2 days. She notes dry cough. Denies SOB. Chills started today. Denies N/V/D. She took one tylenol at 6 am. Flu UTD. +smoker     This history was obtained from the(a) patient    Past Medical History     Past Medical History:   Diagnosis Date   . Abnormal vision     glasses   . Anxiety     uses xanax @ bedtime   . Arthritis     bilateral knees/OA hips/back   . Carpal tunnel syndrome on both sides     stiffness noted    . Difficulty walking     limps   . Eczema     has prescription cream   . Fracture     finger   . Gastroesophageal reflux disease     heartburn recently diet related/some problem swallowing   . Hypertensive disorder     stable meds   . Lower back pain     sciatica radiates down LLE: Pain range= 0-10   . Neck pain     related to lower back but significant at this time    . Neuropathy     numbness upon awakening from low back radiating down LLE .Marland Kitchen. diminishes after awhile after moving around   . Pneumonia     bronchitis in past   . 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        Past Surgical History     Past Surgical History:   Procedure Laterality Date   . ARTHROPLASTY, KNEE, TOTAL Left 12/14/2015    Procedure: ARTHROPLASTY, KNEE, TOTAL;  Surgeon: Cynda Familia, MD;  Location: [facility];  Service: Orthopedics;  Laterality: Left;  LEFT TOTAL KNEE ARTHROPLASTY   . COLONOSCOPY  2016    X1 polyp   . ENDOSCOPIC CARPAL TUNNEL RELEASE Right 03/02/2017    Procedure: ENDOSCOPIC CARPAL TUNNEL RELEASE;  Surgeon: Virgilio Frees., MD;  Location: [facility];  Service: Orthopedics;  Laterality: Right;   . GANGLION CYST EXCISION Right  2007   . INDUCED ABORTION     . KNEE ARTHROSCOPY Right 09/24/2014   . KNEE ARTHROSCOPY Left 03/2015   . REPAIR, UPPER EXTREMITY TENDON Right 03/02/2017    Procedure: REPAIR, UPPER EXTREMITY TENDON, EXCISION, GANGLION CYST;  Surgeon: Virgilio Frees., MD;  Location: [facility];  Service: Orthopedics;  Laterality: Right;  right wrist flexor carpi radialis tenosynovectomy w/ possible carpal tunnel release and soft tissue mass excision  q1-unk, asst=n, equip=needle tip bovie, beaver blade, lead hand, micro aire, md req 90 min/ok per JD   . TONSILLECTOMY     . VAGINAL DELIVERY      X2 spinals ( 4natural deliveries)       Family History     Family History   Problem Relation Age of Onset   . Hypertension Mother    . Stroke Mother    . Hypertension Father    .  Kidney disease Brother        Social History     Social History     Social History   . Marital status: Single     Spouse name: N/A   . Number of children: N/A   . Years of education: N/A     Social History Main Topics   . Smoking status: Current Every Day Smoker     Packs/day: 0.25     Years: 30.00     Types: Cigarettes   . Smokeless tobacco: Never Used      Comment: has patches   . Alcohol use No   . Drug use: No   . Sexual activity: Not on file     Other Topics Concern   . Not on file     Social History Narrative   . No narrative on file       Allergies     Allergies   Allergen Reactions   . Latex Rash       Medications     No current facility-administered medications for this encounter.     Current Outpatient Prescriptions:   .  ALPRAZolam (XANAX) 0.5 MG tablet, TAKE 1 TABLET BY MOUTH NIGHTLY AS NEEDED, Disp: 90 tablet, Rfl: 0  .  benzonatate (TESSALON) 100 MG capsule, Take 1 capsule (100 mg total) by mouth 3 (three) times daily as needed for Cough, Disp: 15 capsule, Rfl: 0  .  DENTA 5000 PLUS 1.1 % Cream, BRUSH FOR 2 MINS TWICE DAILY.EXPECTORATE, DO NOT RINSE. NO EAT/DRINK X30MINS, Disp: , Rfl: 6  .  FLUoxetine (PROZAC) 20 MG capsule, Take 1 capsule  (20 mg total) by mouth daily., Disp: 30 capsule, Rfl: 1  .  losartan-hydrochlorothiazide (HYZAAR) 100-12.5 MG per tablet, TAKE 1 TABLET BY MOUTH DAILY, Disp: 90 tablet, Rfl: 1  .  Multiple Vitamins-Minerals (EQ MULTIVITAMINS ADULT GUMMY) Chew Tab, Chew 1 tablet by mouth daily., Disp: , Rfl:   .  oseltamivir (TAMIFLU) 75 MG capsule, Take 1 capsule (75 mg total) by mouth 2 (two) times daily for 5 days, Disp: 10 capsule, Rfl: 0    Review of Systems     Pertinent Positives and Negatives noted in the HPI.  All Other Systems Reviewed and Negative: Yes      Physical Exam     Physical Exam   Constitutional: She appears well-developed and well-nourished. No distress.   Temp 100  Tachycardic  Well appearing female   HENT:   Head: Normocephalic and atraumatic.   Mouth/Throat: Oropharynx is clear and moist. No oropharyngeal exudate.   Eyes: Conjunctivae are normal.   Cardiovascular: Regular rhythm, normal heart sounds and intact distal pulses.    tachycardic   Pulmonary/Chest: Effort normal and breath sounds normal. She has no wheezes.   Abdominal: Soft. There is no tenderness. There is no rebound and no guarding.   Musculoskeletal: Normal range of motion. She exhibits no edema.   Neurological: She is alert.   Skin: Skin is warm and dry.   Psychiatric: She has a normal mood and affect.   Nursing note and vitals reviewed.        Diagnostic Study Results     Labs -     Labs Reviewed   RAPID INFLUENZA A/B ANTIGENS    Narrative:     ORDER#: [id number]  ORDERED BY: Merceda Elks  SOURCE: Nasal Aspirate                               COLLECTED:  12/18/17 13:55  ANTIBIOTICS AT COLL.:                                RECEIVED :  12/18/17 13:59  Influenza, Rapid Antigen A & B             FINAL       12/18/17 14:09  12/18/17   Negative for Influenza A and B             Reference Range: Negative         Radiologic Studies -   XR Chest 2 Views   Final Result    No lung infiltrates identified.      Melody Haver, MD    12/18/2017 3:36 PM            Clinical Course in the Emergency Department/Medical Decision Making     I reviewed the vital signs, nursing notes, past medical history, past surgical history, family history and social history.    Vital Signs - BP: 140/80, Temp: 100 F (37.8 C), Temp Source: Oral, Heart Rate: (!) 117, Resp Rate: 18, SpO2: 99 %, Height: 5' (152.4 cm), Weight: 80.7 kg    Pulse Oximetry Analysis - nl without need for supplemental oxygen            Labs:I have reviewed the labs at the time of visit.  Merceda Elks MD.      Differential Diagnosis (not completely inclusive): Influenza, Pneumonia, Bronchitis, Pneumonitis, Viral Syndrome    4:04 PM Flu swab negative, but given her symptoms, strong suspicion of flu and will treat with Tamiflu. CXR negative for infiltrate and return precautions reviewed.        Final diagnoses:   Flu-like symptoms     New Prescriptions    BENZONATATE (TESSALON) 100 MG CAPSULE    Take 1 capsule (100 mg total) by mouth 3 (three) times daily as needed for Cough    OSELTAMIVIR (TAMIFLU) 75 MG CAPSULE    Take 1 capsule (75 mg total) by mouth 2 (two) times daily for 5 days     ED Disposition     ED Disposition Condition Date/Time Comment    Discharge  Tue Dec 18, 2017  4:03 PM [name] discharge to home/self care.    Condition at disposition: Stable          _______________________________    Attestations:    I was acting as a scribe for Jobe Gibbon, MD on [name]     I am the first provider for this patient and I personally performed the services documented.  is scribing for me on [name],[name]. This note accurately reflects work and decisions made by me.  Jobe Gibbon, MD  _______________________________               Jobe Gibbon, MD  12/18/17 [phone]

## 2017-12-20 ENCOUNTER — Other Ambulatory Visit (INDEPENDENT_AMBULATORY_CARE_PROVIDER_SITE_OTHER): Payer: Self-pay | Admitting: Internal Medicine

## 2017-12-20 ENCOUNTER — Encounter (INDEPENDENT_AMBULATORY_CARE_PROVIDER_SITE_OTHER): Payer: No Typology Code available for payment source | Admitting: Internal Medicine

## 2017-12-20 NOTE — Telephone Encounter (Signed)
Forwarded to Dr. Vials nurse.

## 2017-12-20 NOTE — Telephone Encounter (Signed)
Name, strength, directions of requested refill(s):  ALPRAZolam (XANAX) 0.5 MG tablet   Pt number [phone]    Pharmacy to send refill to or patient to pick up rx from office (mark requested pharmacy in BOLD):      [id number] - [place], [place] - [address]  [address] [address]  [address]  Phone: [phone] Fax: [phone]        Please mark "X" next to the preferred call back number:    Mobile:   Telephone Information:   Mobile [phone]       Home: [phone]    Work: [phone]          Next visit: 01/08/2018

## 2017-12-20 NOTE — Telephone Encounter (Signed)
Refill encounter forwarded to pcp.

## 2017-12-21 ENCOUNTER — Other Ambulatory Visit (INDEPENDENT_AMBULATORY_CARE_PROVIDER_SITE_OTHER): Payer: Self-pay

## 2017-12-21 ENCOUNTER — Telehealth (INDEPENDENT_AMBULATORY_CARE_PROVIDER_SITE_OTHER): Payer: Self-pay | Admitting: Internal Medicine

## 2017-12-21 NOTE — Telephone Encounter (Signed)
Error

## 2017-12-21 NOTE — Progress Notes (Signed)
12/21/17 0947   SP[facility] Nurse Navigator Follow Up Report Items   Follow Up For: Care Plan Update

## 2017-12-21 NOTE — Progress Notes (Signed)
12/21/17 0947   Pt Outreach/Care Plan Metrics   Payor Grouping for [employer] [facility]   Outreach Status for Metrics listing Left Voicemail 1

## 2017-12-21 NOTE — Progress Notes (Signed)
Nurse Navigator placed telephone call to patient to follow up, update care plan and discuss possible care coordination needs with patient.  No answer at contact telephone number, left voicemail message to call back.          Aubryana Vittorio, BSN, RN-BC, ONC  Nurse Navigator  [facility]  T [phone] M [phone]

## 2017-12-21 NOTE — Progress Notes (Signed)
12/21/17 0948   Patient Interventions This Encounter   Telephone Interventions 1   MyChart Interventions 0   In-Person Intervention 0   Other Intervention 0   Intervention Gilman Buttner this encounter 1

## 2017-12-23 MED ORDER — ALPRAZOLAM 0.5 MG PO TABS
ORAL_TABLET | ORAL | 0 refills | Status: DC
Start: 2017-12-23 — End: 2018-04-05

## 2017-12-24 ENCOUNTER — Other Ambulatory Visit (INDEPENDENT_AMBULATORY_CARE_PROVIDER_SITE_OTHER): Payer: Self-pay

## 2017-12-25 NOTE — Progress Notes (Signed)
SW outreached to [name] to follow up on outcome of [facility] referral. She confirmed that she has a scheduled appointment this month. SW has supported [name] in successfully connecting with behavioral health services. SW encouraged her to reach out in the future, if needed. She confirmed that she had SW's contact information.         [name], MSW  Social Worker CM 1  [facility]   [address]  [place], [place]  [address]  T [phone]   F [phone]  [email or web address]

## 2017-12-31 NOTE — Progress Notes (Signed)
Subjective:          THIS IS A NEW PATIENT             [name] is a 62 y.o. No obstetric history on file. female here for an OFFICE VISIT for BREAST CANCER SCREENING and PELVIC EXAM.    She denies medical concerns over the past year.    Current menopausal symptoms include: none.    The patient is not sexually active.   The patient has regular exercise: yes.      Gynecologic History    HRT: None           Obstetric History  OB History   No data available           The following portions of the patient's history were reviewed and updated as appropriate: allergies, current medications, past family history, past medical history, past social history, past surgical history and problem list.    Current Outpatient Prescriptions on File Prior to Visit   Medication Sig Dispense Refill   . ALPRAZolam (XANAX) 0.5 MG tablet TAKE 1 TABLET BY MOUTH NIGHTLY AS NEEDED 90 tablet 0   . benzonatate (TESSALON) 100 MG capsule Take 1 capsule (100 mg total) by mouth 3 (three) times daily as needed for Cough 15 capsule 0   . DENTA 5000 PLUS 1.1 % Cream BRUSH FOR 2 MINS TWICE DAILY.EXPECTORATE, DO NOT RINSE. NO EAT/DRINK X30MINS  6   . FLUoxetine (PROZAC) 20 MG capsule Take 1 capsule (20 mg total) by mouth daily. 30 capsule 1   . losartan-hydrochlorothiazide (HYZAAR) 100-12.5 MG per tablet TAKE 1 TABLET BY MOUTH DAILY 90 tablet 1   . Multiple Vitamins-Minerals (EQ MULTIVITAMINS ADULT GUMMY) Chew Tab Chew 1 tablet by mouth daily.       No current facility-administered medications on file prior to visit.    .    Review of Systems    Pertinent items are noted in HPI.  All other systems were reviewed and are negative except as previously noted in the HPI.      The following GYN related Review of systems is noted:      She denies any abnormal or post menopausal bleeding.  She denies prolonged or heavy menstrual bleeding.  She denies any breast lumps, masses, breast pain or nipple discharge.  She denies abdominal or pelvic pain. denies  any bloating.  She denies urinary frequency, urgency or dysuria.  She denies urinary incontinence.  She denies change in bowel habits.  She denies increased or malodorous vaginal discharge.  She denies vaginal itching or burning or lesions.  She denies any vaginal dryness.    All other systems were reviewed and are negative except as previously noted in the HPI.      Objective:   There were no vitals taken for this visit.  General appearance: alert, appears stated age and cooperative      Breasts: normal appearance, no masses or tenderness No dominant mass. Non tender. No skin dimpling or lesion.                          No nipple discharge noted.    Abdomen: Soft non tender, non distended, no palpable mass, organomegaly or hernia.                     No rebound. No guarding at all    Extremities: extremities normal, atraumatic, no cyanosis or edema  Pelvic Exam:  Urethral meatus: no erythema or acute lesions or discharge  Urethra: non tender  Bladder: non tender  Perineum: no erythema, lesion or acute changes noted     Vulva: No labial lesions, erythema, vesicles, ulcers, skin discoloration, warts, tags or other acute skin changes   Vagina: Atrophic, normal vagina, no discharge, exudate, lesion, or erythema  No lesions. Reduced estrogen effect noted.  Good pelvic support absence of cystocele or rectocele.   Cervix:  Atrophic, no cervical motion tenderness and no lesions  No cervical discharge, erythema, polyp, cyst or mass   Corpus: normal size, shape, contour, position, consistency, mobility, non-tender, no palpable mass   Adnexa:  no mass, fullness, or tenderness   Perianal region:no skin changes, rash or lesion  Anus: no visible hemorrhoids, tags or lesions  Rectal: no palpable mass, normal sphincter tone.            Assessment:   Annual Gynecologic Exam  Family h/o breast cancer in sister  Patient Active Problem List   Diagnosis   . Neck pain   . Low back pain   . Essential hypertension   . Primary  osteoarthritis of both knees   . Anxiety   . Left knee DJD   . Dizziness   . Lumbar disc disease        Plan:    pap done  Mammogram ordered.  Check results in MyChart.  Colon cancer screening recommended  Education reviewed: self breast exams and weight bearing exercise.  Hormone replacement therapy: hormone replacement therapy: none..  Mammogram ordered.  Follow up in: 1 year.  regular colonoscopy screening

## 2018-01-01 ENCOUNTER — Encounter (INDEPENDENT_AMBULATORY_CARE_PROVIDER_SITE_OTHER): Payer: Self-pay | Admitting: Obstetrics & Gynecology

## 2018-01-01 ENCOUNTER — Ambulatory Visit: Payer: Self-pay

## 2018-01-01 ENCOUNTER — Ambulatory Visit (INDEPENDENT_AMBULATORY_CARE_PROVIDER_SITE_OTHER): Payer: No Typology Code available for payment source | Admitting: Obstetrics & Gynecology

## 2018-01-01 VITALS — BP 113/77 | HR 114 | Wt 174.0 lb

## 2018-01-01 DIAGNOSIS — Z803 Family history of malignant neoplasm of breast: Secondary | ICD-10-CM

## 2018-01-01 DIAGNOSIS — Z01419 Encounter for gynecological examination (general) (routine) without abnormal findings: Secondary | ICD-10-CM

## 2018-01-02 LAB — THIN PREP PAP W/IMAGE ANALYSIS AND HPV
HPV Genotype, 16: NEGATIVE
HPV Genotype, 18: NEGATIVE
HPV High Risk, Other: NEGATIVE

## 2018-01-04 ENCOUNTER — Ambulatory Visit (INDEPENDENT_AMBULATORY_CARE_PROVIDER_SITE_OTHER): Payer: No Typology Code available for payment source | Admitting: Clinical

## 2018-01-04 ENCOUNTER — Other Ambulatory Visit (INDEPENDENT_AMBULATORY_CARE_PROVIDER_SITE_OTHER): Payer: Self-pay

## 2018-01-04 LAB — LAB USE ONLY - HISTORICAL GYN CYTOLOGY/PAP SMEAR

## 2018-01-04 NOTE — Progress Notes (Signed)
01/04/18 1439   Patient Interventions This Encounter   Telephone Interventions 1   MyChart Interventions 0   In-Person Intervention 0   Other Intervention 0   Intervention Tally this encounter 1

## 2018-01-04 NOTE — Progress Notes (Signed)
01/04/18 1438   Pt Outreach/Care Plan Metrics   Outreach Status for [employer] Spoke with Member/Care [employer]

## 2018-01-04 NOTE — Progress Notes (Signed)
Call placed to patient to follow up and update care plan. Patient states that she is doing well. Informed me that the Social Worker helped to find her a Paramedic. States that she had an appointment for today, but somehow she got the addresses mixed up and went to the wrong location. Reports that she has rescheduled for Mon April 22 at 2.00 pm. Denies having any care coordination needs at present. Will follow up in about 4 weeks. Patient encouraged to call if I can be of assistance to her.        Karolee Stamps, BSN, RN-BC, [facility]  T [phone] M [phone]

## 2018-01-07 ENCOUNTER — Ambulatory Visit (INDEPENDENT_AMBULATORY_CARE_PROVIDER_SITE_OTHER): Payer: No Typology Code available for payment source | Admitting: Clinical

## 2018-01-07 DIAGNOSIS — F4323 Adjustment disorder with mixed anxiety and depressed mood: Secondary | ICD-10-CM

## 2018-01-07 DIAGNOSIS — F5104 Psychophysiologic insomnia: Secondary | ICD-10-CM | POA: Insufficient documentation

## 2018-01-07 DIAGNOSIS — F17200 Nicotine dependence, unspecified, uncomplicated: Secondary | ICD-10-CM | POA: Insufficient documentation

## 2018-01-07 NOTE — Progress Notes (Deleted)
No chief complaint on file.     62 y.o.  female presents for f/u of chronic conditions including:    ER 4/2 for URI suspicious of flu despite neg swab.  tx'd with tamiflu    LOV:  L mid-abd pain intermittent for months.  No bowel changes.  No gerd.  Maybe worse over time.  More discomfort than pain.  Drinks plenty of water.    There were no vitals taken for this visit.      PROBLEM LIST:  HTN--on losartan-hctz 100-12.5mg ; compliant with meds; no se's.  Good control  BP Readings from Last 3 Encounters:   01/01/18 113/77   12/18/17 142/82   11/27/17 130/74   Lumbar disc disease with chronic pain--on tramadol 50mg  q8 prn, gabapentin 100mg  tid; had taken vicodin, percocet prior; sees pain management.    OA--bilat knees, hips, back (LLE sciatica)  Smoker--trying patches.  GERD--on mg prn?; no se's.  Good control   Anxiety--on xanax 0.5mg  qhs and trying to wean off;  Tried melatonin (woke groggy). Tried prozac; starting therapy   Chronic insomnia--on xanax 0.5mg  qhs; trial of ambien 10mg  instead.  Eczema--on betamethasone cream prn  Carpal tunnel--bilat  H/o L knee replacement  Obesity--There is no height or weight on file to calculate BMI. Discussed working on diet/exercise.  Wt Readings from Last 3 Encounters:   01/01/18 78.9 kg (174 lb)   12/18/17 80.7 kg (178 lb)   11/27/17 81.5 kg (179 lb 9.6 oz)     SOCIAL HISTORY: Works with mentally handicapped.  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  Smoker (1/4ppd x 30+yrs), no etoh    HEALTH MAINTENANCE:  Shingrix: discussed  Prevnar:  Pneumovax:  Hep C: No results found for: HCVAB  EKG: 6/18  ASCVD Risk (01/08/2018):  ASA:  BMD:   Ca/VitD:    Immunization History   Administered Date(s) Administered   . Influenza (Im) Preserved TRIVALENT VACCINE 06/30/2014   . Influenza quadrivalent (IM) 3 Yrs & greater 07/15/2015   . Influenza quadrivalent (IM) PF 3 Yrs & greater 07/31/2016, 07/23/2017   . Tdap 06/30/2014      Health Maintenance Due   Topic Date Due   . [facility] CARE  PLAN LETTER  1955-11-01   . HEPATITIS C SCREENING  02/12/2007   . Shingrix Vaccine 50+ (1) 02/12/2007   . PAP SMEAR  08/31/2017   . MAMMOGRAM  09/06/2017     Allergies   Allergen Reactions   . Latex Rash     Lab Results   Component Value Date    WBC 5.93 11/27/2017    HGB 14.3 11/27/2017    HCT 46.4 (H) 11/27/2017    PLT 293 11/27/2017    CHOL 198 11/27/2017    TRIG 110 11/27/2017    HDL 47 11/27/2017    LDL 129 (H) 11/27/2017    ALT 37 11/27/2017    AST 33 11/27/2017    NA 141 11/27/2017    K 3.8 11/27/2017    CL 105 11/27/2017    CREAT 0.8 11/27/2017    BUN 11.0 11/27/2017    CO2 27 11/27/2017    TSH 0.60 11/27/2017    INR 1.0 02/22/2017    GLU 90 11/27/2017    HGBA1C 5.5 01/29/2017     No results found for: VITD, 25HYDROXYDTO    Review of Systems   Constitutional: Negative for fever and unexpected weight change.   HENT: Negative for congestion and postnasal drip.  Respiratory: Negative for cough, shortness of breath and wheezing.    Cardiovascular: Negative for chest pain and leg swelling.   Gastrointestinal: Negative for abdominal pain, constipation, diarrhea, nausea and vomiting.   Genitourinary: Negative for dysuria and frequency.   Psychiatric/Behavioral: Positive for sleep disturbance. Negative for dysphoric mood. The patient is nervous/anxious.      Physical Exam   Constitutional: She is oriented to person, place, and time. No distress.   Eyes: Conjunctivae are normal.   Neck: Normal range of motion. No thyromegaly present.   Cardiovascular: Normal rate, regular rhythm, normal heart sounds and intact distal pulses.  Exam reveals no gallop and no friction rub.    No murmur heard.  Pulmonary/Chest: Effort normal and breath sounds normal. She has no wheezes. She has no rales.   Abdominal: Soft. Bowel sounds are normal. She exhibits no distension and no mass. There is no tenderness.   Musculoskeletal: She exhibits no edema.   Lymphadenopathy:     She has no cervical adenopathy.   Neurological: She is alert  and oriented to person, place, and time.   Skin: No rash noted.   Psychiatric: She has a normal mood and affect. Her behavior is normal.     HIgh BMI Follow Up   BMI Follow Up Care Plan Documented    ASSESSMENT/PLAN:  (See above for details)  There are no diagnoses linked to this encounter.    Continue current meds for stable conditions  Symptomatic treatment reviewed in detail    Risk & Benefits of any new medication(s) were explained to the patient who verbalized understanding & agreed to the treatment plan.     Call if symptoms persist, worsen, or change.  Call with updates/questions/concerns.

## 2018-01-07 NOTE — Progress Notes (Signed)
[facility] Integrated Behavioral Health Assessment     01/07/2018    [name], a 62 y.o. female, for initial evaluation visit.    Start: 2:00 PM  End:   3:00 PM    Referred by Dr. Lennice Sites    Chief Complaint: [name] presents today for an initial evaluation visit to establish individual psychotherapy.  Pt reports that she was diagnosed with anxiety in the past and wants to see if she is also depressed.  Pt reports that she has had a lot of pople who have beeen closed to her who have passed.        Current Symptoms:(see/administer PHQ-9 and GAD-7; also in "Visit Information")     [x]  Loss of interest or pleasure in activities (inc loss of libido)  [x]  Depressed mood/ feeling hopeless / increased crying spells   [x]  Sleep pattern disturbance (including nightmares)  [x]  Feeling tired/low energy/ fatigue  [x]  Change in appetite   []  Excessive guilt / feeling bad about self  [x]  Concentration/forgetfulness  [x]  Psychomotor retardation or agitation  []  Suicidal or self-harming ideation     [x]  Anxiety or Nervousness  []  Inability to stop/control excessive worry   []  Difficulty relaxing / restlessness   []  Increased irritability    []  Hallucinations   [x]  Excessive energy    [] ________________         Depression: 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  Pt reports that she takes him to doctors appointments and takes him grocery shopping.  Pt reports that at times she can feel alone.  Pt reports that her sister passed away during 2022/12/03.  Pt reports that she can't sleep at nights if she doesn't take her anxiety medications.  Pt reports that she works everyday.  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  Pt reports that she can't keep still and has nervous energy.  Pt reports that she is taking prozac.  Pt reports that when she takes prozac that she doesn't feel as productive.  Pt reports that her son passed in 2004, her brother passed in 2010 and her  other brother passed in 2019.  Pt reports that her mother has also passed.  Pt reports that her husband passed away 5 years ago.  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  Pt reports that she has an artifical right knee and has two herniated discs in her back.  Pt reports some difficulty with pain management.  Pt reports that keeping her sobriety is important to her.  Pt reports that she had 6 siblings and she has adult children.  Pt reports that two of her children live with her.  Pt reports that the holidays are difficult for her as they bring up feelings of grief.  Pt reports that she sleeps about 6 hours per night.  Pt reports that she can have racing thoughts without taking the medication.  Pt reported just recently having some nightmares.  Pt reports that she can feel down at times.  Pt reports that she has had a lot of fatigue daily.  Pt reports that she has to make herself eat in the morning due to a lack of appetite.  Pt reports that she doesn't feel like she has feelings of low self-esteem. Pt reports some trouble concentrating.  Pt reports feeling fidgety all the time.  Pt reports that she does  have a history of depression which began in childhood.  Pt reports that during her childhood she felt unsupported and that she was the oldest and often had to take care of her siblings.  Pt reports that when she was a child she moved often.   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      Anxiety: Pt reports that she does feel like she has a history of anxiety and she's not sure when this occurred.  Pt reports that she does go to church and has a spiritual sense/identity.  Pt reports that she does feel like she experiences anxiety attacks but no panic attacks.  Pt reports that she gets irritable due to her anxiety.  Pt reports that she can get physical sxs of anxiety like: sweating, feeling fidgety,  feeling like she can't sit still.  Pt reports that she doesn't like snake, heights, or cockroaches.         Mania: Pt denied the presence of any manic symptoms.     OCD: Pt denied obsessive, intrusive and persistent thoughts or compulsive, ritualistic acts. Pt likes to clean and have things in certain places.      Eating Disorders: Pt denied any symptoms of bingeing, purging or other indications of an eating disorder.  Pt reports that she feels like she can stress eat.      Psychosis: Pt denied presence of any hallucinations, delusions or other sx of psychotic process.     Physical Health Concerns  Pt reports having a knee replaced, another knee that is injured, and having high blood pressure.    Current physical health medication includes:      ALPRAZolam 0.5 MG TAKE 1 TABLET BY MOUTH NIGHTLY AS NEEDED      Benzonatate 100 mg Oral 3 times daily PRN      FLUoxetine HCl 20 mg Oral Daily      Losartan Potassium-HCTZ 100-12.5 MG TAKE 1 TABLET BY MOUTH DAILY       Multiple Vitamins-Minerals 1 tablet Oral Daily       Sodium Fluoride 1.1 % BRUSH FOR 2 MINS TWICE DAILY.EXPECTORATE, DO NOT RINSE. NO EAT/DRINK X30MINS       History of Present Illness:  Patient reported concerns with above-mentioned symptoms over the last ten years.         Patient reported these symptoms have been stable for several months.     Patient reported possible  precipitating event(s) include the following: loved ones/family members passing away.      Current Stressors:      [x]  Work/School related:       [x]  Financial      [x]  Family       []  Legal      []  Health       []  Other      Past Psychiatric History:   Previous Mental HealthTreatment/diagnosis: yes, pt reports that 15 years ago she saw a therapist and she saw a psychiatrist for about 5 years in the past.    Current/Previous Psychiatrist: Yes, see above.   Current/Previous Therapist: Yes, see above.    Previous hospitalizations: None reported   Previous suicide attempts: None reported    Previous medications: prozac, zoloft, trazadone  Current OTC medications: None reported     Risk Assessment   Suicidal ideation: none reported   Suicidal intent: none reported   Suicidal plan: none reported  History of attempt (s): none reported       Homicidal ideation:    Do you have thoughts of harming someone else?  None reported   Do you have a plan to harm anyone else at this time:  None reported   Have you assaulted or threatened anyone recently? None reported   Have you ever been in trouble because of your temper/violence? Yes, pt reports past problems with irritability.    Does drinking/drugging ever lead you to become violent? None reported   Do you own a gun or a lethal weapon? None reported   Have you ever considered/planned harming yourself or others with a gun or other lethal weapon? None reported     Database administrator (if pt endorsed any SI/HI statements)     [x]  Assessed and explored patient's thoughts suicidal ideation, self-harm.  Not considered an active concern at this time     []  The patient is considered at moderate risk for suicide, but not to the degree that requires immediate inpatient hospitalization. Patient agreed to safety plan which includes calling 911 and/or going to ER if needed.       []  Patient is considered at high risk of harm to self/others.  Patient sent for further evaluation with emergency services.     Substance Abuse History:  Recreational drugs: heroin  Past use, pt has been sober for 20 + years  Use of alcohol: denied  Use of caffeine: tea 1x cup /day  Tobacco use: yes - pt reports that she is current smoker and smokes 5-6 cigarettes a day and that she has been doing this for 40 years.      Legal consequences of chemical use: yes , pt reports being arrested for drug possession about 40 years ago.      C.A.G.E   Patient feels she ought to cut down on drinking and/or drug use: no  Patient has been annoyed by others criticizing her drinking or drug use: no  Patient has felt  bad or guilty about drinking or drug use:no  Patient has had a drink or used drugs as an eye opener first thing in the morning to steady nerves, get rid of a hangover or get the day started: no    Trauma  Pt reports being a survivor of abuse: none reported.   Pt has experienced/ witnessed the following traumatic events: Pt reports seeing a dead body before in the past.    Reexperiencing: yes, pt reports some cues to past trauma at times.        Brief Personal History:  Patient currently lives with two children.  Pt is originally from [place].     Pt's parents are deceased.  Pt has 5 siblings.  Functioning Relationships: good support system and alone & isolated    Current daily activity:  Go out, spend time with family.      Mental Status Evaluation:  General/Appearance:  [x] age appropriate    [] bearded    [x] casually dressed       [] defiant     [x] cooperative [] disheveled    [] older than stated age    [] overweight    [] piercings    [] tattooed    [] thin & gaunt looking    [] well dressed    [] younger than stated age     [x] good eye contact    [] avoidant eye contact    [] hesitant   [] Other:    Gait:    [x] normal gait    [] gait abnormality:  Behavior/Psychomotor:    [x] Normal   [] psychomotor agitation    [] psychomotor retardation    [] restless     [] fidgety     [] tics  [] rigidity  [] flaccid       [] akathesia    [] choreaoathetoid movt   [] Other:    Speech:      [x] Normal pitch     [x] normal volume    [] articulation error    [] delayed    [] increased latency of response     [] loud    [] pressured    [] profane     [] soft [] perseveration  [] Other:    Mood:  []  Normal     [] angry    [x] anxious    [] constricted    [] decreased range     [x] depressed    [] dysthymic    [] euphoric    [] euthymic    [] irritable    [] labile     [] sad   [] Other:    Affect:      [x] congruent with mood      [] full      [] constricted      [] guarded      [] flat      [] blunted      [] expansive   [] Other:    Thought Process /Attention /Concentration:       [x] Normal    []  blocked    [] circumstantial     [] concrete    [] flight of ideas     [x] goal directed    [] loose associations     [] tangential       [] distractible      [] inattentive       [x] associations intact [x]  abstract reasoning intact   Thought Content:     [] delusions    [] obsessions        [x]  absent of suicidal or homicidal ideation   [x] absent of psychotic symptoms  []  A/H     [] paranoia          [] obsessions      [] suicidal ideation      [] suicidal plan      [] suicidal intent      [] passive suicidal ideation      [] homicidal ideation      [] homicidal plan      [] homicidal intent  [] Other:    Perception /Sensorium:         [x] alert     [] drowsy      [x] oriented to person    [x] oriented to place     [x] oriented to time     [x] oriented to situation          [x] does not appear to be reacting to internal or external stimuli today       [] appears to be reacting to internal or external stimuli  [] Other:    Language:  [x] age appropriate    []  naming okay   []  repetition    Fund of knowledge:  [x] age appropriate    [x]  adequate   [] in adequate []  above average   Cognition/Memory:        [x]  cognition grossly intact       []  cognition impaired due to:   []  immediate recall deficit  []  recent memory deficit  [] delayed memory deficit   [] MMSE:   [] MOCA:    Insight:        [x] age appropriate      [x] fair       [] good       [] limited   Judgment:        [  x]age appropriate      [x] fair       [] good       [] limited          Assessment:  Pt is a 62 year old African-American female from [place]. 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      Identified precipitating factors: loss of family members  Identified maintaining factors: loss of family members  Identified protective factors:spirituality      Plan:    Treatment Goals:     [] Psychotherapy 1x weekly                  [x]  Increase positive  enjoyable/meaningful activities (behavioral activation)          [x]  Develop coping skills (e.g. deep breathing, relaxation)         [x]  Identify triggers and maintaining factors to current sx        [x]  Practice sleep hygiene         []  Practice smoking cessation skills               [x]  Improve functioning and prevent recurrence or worsening of symptoms        []  Increase medication adherence                            []  Other:       Recommendations:      [x]  Follow-up psychotherapy visits scheduled for 01/21/2018.       []  Pt not considered appropriate for psychotherapy at this time       []  Patient referred back to PCP to discuss trial psychotropic medication       []  Patient referred to outpatient psychiatry services          []  Patient referred for higher level of care (psychiatry, PHP, IOP, IPAC or emergency room for further evaluation)          []  Patient referred to Care Navigation to pursue longer-term therapy options       []  Other:           Dustin Folks, Ph.D., LCP

## 2018-01-08 ENCOUNTER — Telehealth (INDEPENDENT_AMBULATORY_CARE_PROVIDER_SITE_OTHER): Payer: Self-pay | Admitting: Internal Medicine

## 2018-01-08 ENCOUNTER — Ambulatory Visit (INDEPENDENT_AMBULATORY_CARE_PROVIDER_SITE_OTHER): Payer: No Typology Code available for payment source | Admitting: Internal Medicine

## 2018-01-08 NOTE — Telephone Encounter (Signed)
Forwarded to [place].

## 2018-01-08 NOTE — Telephone Encounter (Signed)
Patient called requesting to ask Dr Milas Gain if she is able to fit her in to see her. Patient was informed she missed her appt today. Patient did not remember her appt was scheduled for today due to her stressful job. Patient was informed the next opening she has is in June. Patient would like a call back she cannot wait so long. Please call pt back at [phone].

## 2018-01-09 NOTE — Telephone Encounter (Signed)
That's fine

## 2018-01-09 NOTE — Telephone Encounter (Signed)
Spoke with pt. She is coming in tomorrow.

## 2018-01-09 NOTE — Telephone Encounter (Signed)
Okay to fit in earlier?

## 2018-01-10 ENCOUNTER — Ambulatory Visit (INDEPENDENT_AMBULATORY_CARE_PROVIDER_SITE_OTHER): Payer: No Typology Code available for payment source | Admitting: Internal Medicine

## 2018-01-10 ENCOUNTER — Encounter (INDEPENDENT_AMBULATORY_CARE_PROVIDER_SITE_OTHER): Payer: Self-pay | Admitting: Internal Medicine

## 2018-01-10 VITALS — BP 125/82 | HR 121 | Temp 97.7°F | Ht 60.0 in | Wt 175.4 lb

## 2018-01-10 DIAGNOSIS — I10 Essential (primary) hypertension: Secondary | ICD-10-CM

## 2018-01-10 DIAGNOSIS — F17200 Nicotine dependence, unspecified, uncomplicated: Secondary | ICD-10-CM

## 2018-01-10 DIAGNOSIS — F5104 Psychophysiologic insomnia: Secondary | ICD-10-CM

## 2018-01-10 DIAGNOSIS — B009 Herpesviral infection, unspecified: Secondary | ICD-10-CM | POA: Insufficient documentation

## 2018-01-10 DIAGNOSIS — F419 Anxiety disorder, unspecified: Secondary | ICD-10-CM

## 2018-01-10 MED ORDER — LOSARTAN POTASSIUM-HCTZ 100-12.5 MG PO TABS
ORAL_TABLET | ORAL | 3 refills | Status: DC
Start: 2018-01-10 — End: 2019-02-17

## 2018-01-10 MED ORDER — VARENICLINE TARTRATE 0.5 MG X 11 & 1 MG X 42 PO MISC
ORAL | 0 refills | Status: DC
Start: 2018-01-10 — End: 2018-03-14

## 2018-01-10 MED ORDER — VARENICLINE TARTRATE 1 MG PO TABS
1.00 mg | ORAL_TABLET | Freq: Two times a day (BID) | ORAL | 1 refills | Status: DC
Start: 2018-01-10 — End: 2018-03-14

## 2018-01-10 NOTE — Progress Notes (Signed)
Chief Complaint   Patient presents with   . Medication Management      62 y.o.  female presents for f/u of chronic conditions including:  Had flu 3 wks ago.  Then totally better but started with congestion    BP 125/82   Pulse (!) 121   Temp 97.7 F (36.5 C)   Ht 1.524 m (5')   Wt 79.6 kg (175 lb 6.4 oz)   BMI 34.26 kg/m       PROBLEM LIST:  HTN--on losartan-hctz 100-12.5mg ; compliant with meds; no se's.  Good control  BP Readings from Last 3 Encounters:   01/10/18 125/82   01/01/18 113/77   12/18/17 142/82   Lumbar disc disease with chronic pain--on tramadol 50mg  q8 prn, gabapentin 100mg  tid; had taken vicodin, percocet prior; sees pain management.    OA--bilat knees, hips, back (LLE sciatica)  Smoker--wrote for chantix.  GERD--on mg prn?; no se's.  Good control   Allergic rhinitis--on antihistamine prn; no side effects.  Good control   Anxiety--on xanax 0.5mg  qhs and trying to wean off;  Tried melatonin (woke groggy). Tried prozac (felt stuck); doing therapy (sister died 12/11/2022).  Doing better lately.  Chronic insomnia--on xanax 0.5mg  qhs; trial of ambien 10mg  instead.  Was on trazodone previously  Eczema--on betamethasone cream prn  Carpal tunnel--bilat  H/o L knee replacement  Obesity--Body mass index is 34.26 kg/m. Discussed working on diet/exercise.  Wt Readings from Last 3 Encounters:   01/10/18 79.6 kg (175 lb 6.4 oz)   01/01/18 78.9 kg (174 lb)   12/18/17 80.7 kg (178 lb)     SOCIAL HISTORY: Works with mentally handicapped.  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  Smoker (1/4ppd x 30+yrs), no etoh    HEALTH MAINTENANCE:  Shingrix: discussed  Prevnar:  Pneumovax:  Hep C: No results found for: HCVAB  EKG: 6/18  ASCVD Risk (01/10/2018):  ASA:  BMD:   Ca/VitD:    Immunization History   Administered Date(s) Administered   . Influenza (Im) Preserved TRIVALENT VACCINE 06/30/2014   . Influenza quadrivalent (IM) 3 Yrs & greater 07/15/2015   . Influenza quadrivalent (IM) PF 3 Yrs & greater 07/31/2016,  07/23/2017   . Tdap 12/17/2008, 06/30/2014      Health Maintenance Due   Topic Date Due   . [facility] CARE PLAN LETTER  10/26/1955   . HEPATITIS C SCREENING  02/12/2007   . Shingrix Vaccine 50+ (1) 02/12/2007   . MAMMOGRAM  09/06/2017     Allergies   Allergen Reactions   . Latex Rash     Lab Results   Component Value Date    WBC 5.93 11/27/2017    HGB 14.3 11/27/2017    HCT 46.4 (H) 11/27/2017    PLT 293 11/27/2017    CHOL 198 11/27/2017    TRIG 110 11/27/2017    HDL 47 11/27/2017    LDL 129 (H) 11/27/2017    ALT 37 11/27/2017    AST 33 11/27/2017    NA 141 11/27/2017    K 3.8 11/27/2017    CL 105 11/27/2017    CREAT 0.8 11/27/2017    BUN 11.0 11/27/2017    CO2 27 11/27/2017    TSH 0.60 11/27/2017    INR 1.0 02/22/2017    GLU 90 11/27/2017    HGBA1C 5.5 01/29/2017     No results found for: VITD, 25HYDROXYDTO    Review of Systems   Constitutional: Negative for fever and unexpected  weight change.   HENT: Negative for congestion and postnasal drip.    Respiratory: Negative for cough, shortness of breath and wheezing.    Cardiovascular: Negative for chest pain and leg swelling.   Gastrointestinal: Negative for abdominal pain, constipation, diarrhea, nausea and vomiting.   Genitourinary: Negative for dysuria and frequency.   Psychiatric/Behavioral: Positive for sleep disturbance. Negative for dysphoric mood. The patient is not nervous/anxious.      Physical Exam   Constitutional: She is oriented to person, place, and time. No distress.   Eyes: Conjunctivae are normal.   Neck: Normal range of motion. No thyromegaly present.   Cardiovascular: Normal rate, regular rhythm, normal heart sounds and intact distal pulses.  Exam reveals no gallop and no friction rub.    No murmur heard.  Pulmonary/Chest: Effort normal and breath sounds normal. She has no wheezes. She has no rales.   Abdominal: Soft. Bowel sounds are normal. She exhibits no distension and no mass. There is no tenderness.   Musculoskeletal: She exhibits no edema.    Lymphadenopathy:     She has no cervical adenopathy.   Neurological: She is alert and oriented to person, place, and time.   Skin: No rash noted.   Psychiatric: She has a normal mood and affect. Her behavior is normal.     HIgh BMI Follow Up   BMI Follow Up Care Plan Documented    ASSESSMENT/PLAN:  (See above for details)  [name] was seen today for medication management.    Diagnoses and all orders for this visit:    Essential hypertension  -     losartan-hydrochlorothiazide (HYZAAR) 100-12.5 MG per tablet; TAKE 1 TABLET BY MOUTH DAILY    Anxiety    Smoker  -     varenicline (CHANTIX STARTING MONTH PAK) 0.5 MG X 11 & 1 MG X 42 tablet; Take one 0.5mg  tab po qd for 3 days, then increase to one 0.5mg  tab bid for 3 days, then increase to one 1mg  tab bid.  -     varenicline (CHANTIX CONTINUING MONTH PAK) 1 MG tablet; Take 1 tablet (1 mg total) by mouth 2 (two) times daily    Chronic insomnia        Continue current meds for stable conditions  Symptomatic treatment reviewed in detail    Risk & Benefits of any new medication(s) were explained to the patient who verbalized understanding & agreed to the treatment plan.     Call if symptoms persist, worsen, or change.  Call with updates/questions/concerns.

## 2018-01-10 NOTE — Patient Instructions (Signed)
Claritin / Zyrtec / Allegra daily during the season (NO D)  Flonase nasal

## 2018-01-10 NOTE — Progress Notes (Signed)
Have you seen any specialists/other providers since your last visit with [place]?    Yes    Arm preference verified?   No    The patient is due for mammogram, shingles vaccine and Care Plan and Hep C

## 2018-01-21 ENCOUNTER — Ambulatory Visit (INDEPENDENT_AMBULATORY_CARE_PROVIDER_SITE_OTHER): Payer: No Typology Code available for payment source | Admitting: Clinical

## 2018-01-22 ENCOUNTER — Other Ambulatory Visit (INDEPENDENT_AMBULATORY_CARE_PROVIDER_SITE_OTHER): Payer: Self-pay

## 2018-01-22 ENCOUNTER — Encounter (INDEPENDENT_AMBULATORY_CARE_PROVIDER_SITE_OTHER): Payer: Self-pay

## 2018-01-22 DIAGNOSIS — Z1231 Encounter for screening mammogram for malignant neoplasm of breast: Secondary | ICD-10-CM

## 2018-01-23 ENCOUNTER — Telehealth (INDEPENDENT_AMBULATORY_CARE_PROVIDER_SITE_OTHER): Payer: Self-pay | Admitting: Internal Medicine

## 2018-01-23 NOTE — Progress Notes (Signed)
Dear Ms. Falkner,   We got the copy of your chest ct from march and it looked normal.    Please feel free to contact us with any questions or concerns.    Best,     Lennice Sites

## 2018-01-23 NOTE — Telephone Encounter (Signed)
Please let pt know we got her bone density from march from [facility] radiology.      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  Definitely make sure to get around 1000 mg of calcium and 1000 units of vitamin d daily and work on weight bearing exercise (anything but swimming will help).  No need for a prescription medication at this point.  We'll recheck again in 2 years.  Feel free to call/email with any questions or concerns.  Best,   Lennice Sites

## 2018-01-24 ENCOUNTER — Encounter (INDEPENDENT_AMBULATORY_CARE_PROVIDER_SITE_OTHER): Payer: Self-pay

## 2018-01-25 ENCOUNTER — Telehealth (INDEPENDENT_AMBULATORY_CARE_PROVIDER_SITE_OTHER): Payer: Self-pay | Admitting: Internal Medicine

## 2018-01-25 NOTE — Telephone Encounter (Signed)
Pt returned call and was given DXA scan results and recommendations. Pt verbalized understanding and had no further questions at the time of the call.

## 2018-01-25 NOTE — Telephone Encounter (Signed)
I spoke with the pt and she said she only received 90 tablets last month. I called [facility] and was informed that her insurance will only cover a 30 day supply and the remaining 60 day supply was added as refills. Pharmacy stated that they will go ahead and process a refill for the pt to pick up today. Pt informed of the above and had no other questions at the time.

## 2018-01-25 NOTE — Telephone Encounter (Signed)
Called pt with results and there was no answer and no voicemail. Will attempt again later today.

## 2018-01-25 NOTE — Telephone Encounter (Signed)
It looks like Rx was sent to pharmacy last month for 90 tabs. Pt stated she is returning Michelle's call, see below.

## 2018-01-25 NOTE — Telephone Encounter (Addendum)
Name, strength, directions of requested refill(s):  ALPRAZolam (XANAX) 0.5 MG tablet    Pharmacy to send refill to or patient to pick up rx from office (mark requested pharmacy in BOLD):      [id number] [place], [place] - [address]  [address] [place] [address]  Phone: [phone] Fax: [phone]        Please mark "X" next to the preferred call back number:    Mobile:   Telephone Information:   Mobile [phone]    x   Home: [phone]    Work: [phone]      Pt. Called back returning call to Nurse Marcelino Duster. Pt. Has requested a call back [phone]       Next visit: Visit date not found

## 2018-02-04 ENCOUNTER — Other Ambulatory Visit (INDEPENDENT_AMBULATORY_CARE_PROVIDER_SITE_OTHER): Payer: Self-pay

## 2018-02-04 NOTE — Progress Notes (Signed)
02/04/18 1028   Pt Outreach/Care Plan Metrics   [employer] for [employer] Status for Metrics listing Left Voicemail 1

## 2018-02-04 NOTE — Progress Notes (Signed)
Nurse Navigator placed telephone call to patient to follow up, update care plan and discuss possible care coordination needs with patient.  No answer at contact telephone number, left voicemail message to call back.          Vaniah Chambers, BSN, RN-BC, ONC  Nurse Navigator  [employer]  T [phone] M [phone]

## 2018-02-04 NOTE — Progress Notes (Signed)
02/04/18 1029   Patient Interventions This Encounter   Telephone Interventions 1   MyChart Interventions 0   In-Person Intervention 0   Other Intervention 0   Intervention Tally this encounter 1

## 2018-02-04 NOTE — Progress Notes (Signed)
02/04/18 1029   SPN Nurse Navigator Follow Up Report Items   Follow Up For: Care Plan Update

## 2018-02-05 ENCOUNTER — Ambulatory Visit (INDEPENDENT_AMBULATORY_CARE_PROVIDER_SITE_OTHER): Payer: No Typology Code available for payment source | Admitting: Clinical

## 2018-02-10 ENCOUNTER — Other Ambulatory Visit (INDEPENDENT_AMBULATORY_CARE_PROVIDER_SITE_OTHER): Payer: Self-pay | Admitting: Internal Medicine

## 2018-02-10 DIAGNOSIS — F17200 Nicotine dependence, unspecified, uncomplicated: Secondary | ICD-10-CM

## 2018-02-12 ENCOUNTER — Other Ambulatory Visit (INDEPENDENT_AMBULATORY_CARE_PROVIDER_SITE_OTHER): Payer: Self-pay

## 2018-02-12 NOTE — Progress Notes (Signed)
02/12/18 1134   Pt Outreach/Care Plan Metrics   [employer] for [employer] Status for [employer] Spoke with Member/Care [employer]

## 2018-02-12 NOTE — Progress Notes (Signed)
Incoming call received from patient in response to message left for her earlier today. Patient states that she is doing well. States that she went to her first appointment with the therapist, that she really like her therapist and that she felt a lot better after the appointment. 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 Discussed ways to keep the appointments organized so that she does not miss any. Patient states that she just got herself a planner to write down all the appointments. Also suggested that she utilize the calender on her phone as well in case she does not have the planner with her. States that she needs to make another appointment to see the therapist but she has lost the number. Will find the number and call patient back. Patient encouraged to call if I can be of assistance to her.         Karolee Stamps, BSN, RN-BC, [employer]  T [phone] M [phone]

## 2018-02-12 NOTE — Progress Notes (Signed)
02/12/18 1023   Patient Interventions This Encounter   Telephone Interventions 1   MyChart Interventions 0   In-Person Intervention 0   Other Intervention 0   Intervention Tally this encounter 1

## 2018-02-12 NOTE — Progress Notes (Signed)
02/12/18 1134   Patient Interventions This Encounter   Telephone Interventions 1   MyChart Interventions 0   In-Person Intervention 0   Other Intervention 0   Intervention Tally this encounter 1

## 2018-02-12 NOTE — Progress Notes (Signed)
02/12/18 1022   Pt Outreach/Care Plan Metrics   Payor Grouping for [employer] Status for Metrics listing Left Voicemail 2

## 2018-02-12 NOTE — Progress Notes (Signed)
Nurse Navigator placed telephone call to patient to verify patient's primary care physician and discuss possible care coordination needs with patient.  No answer at contact telephone number, left second voicemail message to call back.      Myrna Vonseggern, BSN, RN-BC, ONC  Nurse Navigator  [facility]  T [phone] M [phone]

## 2018-02-13 ENCOUNTER — Other Ambulatory Visit (INDEPENDENT_AMBULATORY_CARE_PROVIDER_SITE_OTHER): Payer: Self-pay

## 2018-02-13 NOTE — Progress Notes (Signed)
02/13/18 1539   Patient Interventions This Encounter   Telephone Interventions 1   MyChart Interventions 0   In-Person Intervention 0   Other Intervention 0   Intervention Tally this encounter 1

## 2018-02-13 NOTE — Progress Notes (Signed)
02/13/18 1539   Pt Outreach/Care Plan Metrics   Payor Grouping for Outreach [employer] Status for [employer] Spoke with Member/Care [employer]

## 2018-02-13 NOTE — Progress Notes (Signed)
Call placed to patient. Gave her phone number to schedule her next therapy appointment - [phone]. Patient states that she will call to make the appointment. Will follow up in about 3 weeks. Encouraged to call if I can be of assistance to her.        Karolee Stamps, BSN, RN-BC, [facility]  T [phone] M [phone]

## 2018-02-13 NOTE — Progress Notes (Signed)
02/13/18 1539   SPN Nurse Navigator Follow Up Report Items   Follow Up For: Care Plan Update

## 2018-02-24 ENCOUNTER — Encounter (INDEPENDENT_AMBULATORY_CARE_PROVIDER_SITE_OTHER): Payer: Self-pay

## 2018-03-07 ENCOUNTER — Other Ambulatory Visit (INDEPENDENT_AMBULATORY_CARE_PROVIDER_SITE_OTHER): Payer: Self-pay

## 2018-03-07 NOTE — Progress Notes (Signed)
03/07/18 1338   Patient Interventions This Encounter   Telephone Interventions 1   MyChart Interventions 0   In-Person Intervention 0   Other Intervention 0   Intervention Tally this encounter 1

## 2018-03-07 NOTE — Progress Notes (Signed)
03/07/18 1338   SPN Nurse Navigator Follow Up Report Items   Follow Up For: Care Plan Update

## 2018-03-07 NOTE — Progress Notes (Signed)
Nurse Navigator placed telephone call to patient to follow up, update care plan and discuss possible care coordination needs with patient.  No answer at contact telephone number, left voicemail message to call back.          Tamicka Shimon, BSN, RN-BC, ONC  Nurse Navigator  [facility]  T [phone] M [phone]

## 2018-03-07 NOTE — Progress Notes (Signed)
03/07/18 1338   Pt Outreach/Care Plan Metrics   Payor Grouping for [employer] Status for [employer] listing Left Voicemail 1

## 2018-03-14 ENCOUNTER — Other Ambulatory Visit (INDEPENDENT_AMBULATORY_CARE_PROVIDER_SITE_OTHER): Payer: Self-pay | Admitting: Internal Medicine

## 2018-03-14 DIAGNOSIS — F17200 Nicotine dependence, unspecified, uncomplicated: Secondary | ICD-10-CM

## 2018-03-26 ENCOUNTER — Encounter (INDEPENDENT_AMBULATORY_CARE_PROVIDER_SITE_OTHER): Payer: Self-pay

## 2018-03-28 ENCOUNTER — Other Ambulatory Visit (INDEPENDENT_AMBULATORY_CARE_PROVIDER_SITE_OTHER): Payer: Self-pay

## 2018-03-28 NOTE — Progress Notes (Signed)
Nurse Navigator placed telephone call to patient to follow up, update care plan and discuss possible care coordination needs with patient.  No answer at contact telephone number, left second voicemail message to call back.        Niasia Lanphear, BSN, RN-BC, ONC  Nurse Navigator  [facility]  T [phone] M [phone]

## 2018-03-28 NOTE — Progress Notes (Signed)
03/28/18 1105   Pt Outreach/Care Plan Metrics   Payor Grouping for [employer] Status for Metrics listing Left Voicemail 2

## 2018-03-28 NOTE — Progress Notes (Signed)
03/28/18 1105   SPN Nurse Navigator Follow Up Report Items   Follow Up For: Care Plan Update

## 2018-03-28 NOTE — Progress Notes (Signed)
03/28/18 1105   Patient Interventions This Encounter   Telephone Interventions 1   MyChart Interventions 0   In-Person Intervention 0   Other Intervention 0   Intervention Tally this encounter 1

## 2018-04-05 ENCOUNTER — Other Ambulatory Visit (INDEPENDENT_AMBULATORY_CARE_PROVIDER_SITE_OTHER): Payer: Self-pay | Admitting: Internal Medicine

## 2018-04-05 MED ORDER — DENTA 5000 PLUS 1.1 % DT CREA
TOPICAL_CREAM | DENTAL | 11 refills | Status: DC
Start: 2018-04-05 — End: 2020-09-14

## 2018-04-05 MED ORDER — ALPRAZOLAM 0.5 MG PO TABS
ORAL_TABLET | ORAL | 0 refills | Status: DC
Start: 2018-04-05 — End: 2018-05-15

## 2018-04-05 MED ORDER — BETAMETHASONE DIPROPIONATE 0.05 % EX CREA
TOPICAL_CREAM | Freq: Two times a day (BID) | CUTANEOUS | 1 refills | Status: DC | PRN
Start: 2018-04-05 — End: 2018-05-30

## 2018-04-05 NOTE — Telephone Encounter (Signed)
Pt came in today requesting refills for    betamethasone dipropionate (DIPROLENE) 0.05 % cream    DENTA 5000 PLUS 1.1 % Cream     ALPRAZolam (XANAX) 0.5 MG tablet    To be printed for pick up.    Thank you!    Pt does have an apt scheduled in Sept.

## 2018-04-05 NOTE — Telephone Encounter (Signed)
LMOM informing pt that you would not be able to print them and will send them to the pharmacy on file.

## 2018-04-18 ENCOUNTER — Other Ambulatory Visit (INDEPENDENT_AMBULATORY_CARE_PROVIDER_SITE_OTHER): Payer: Self-pay

## 2018-04-18 NOTE — Progress Notes (Signed)
Received [place] voicemail message from patient asking me to call her back. Nurse Navigator placed telephone call to patient to discuss possible care coordination needs with patient.  No answer at contact telephone number, left voicemail message to call back.          Karolee Stamps, BSN, RN-BC, [facility]  T [phone] M [phone]

## 2018-04-18 NOTE — Progress Notes (Signed)
04/18/18 1349   SPN Nurse Navigator Follow Up Report Items   Follow Up For: Care Plan Update

## 2018-04-18 NOTE — Progress Notes (Signed)
04/18/18 1348   Pt Outreach/Care Plan Metrics   Payor Grouping for [employer] Status for [employer] listing Left Voicemail 1

## 2018-04-18 NOTE — Progress Notes (Signed)
04/18/18 1349   Patient Interventions This Encounter   Telephone Interventions 2   MyChart Interventions 0   In-Person Intervention 0   Other Intervention 0   Intervention Gilman Buttner this encounter 2

## 2018-04-23 ENCOUNTER — Other Ambulatory Visit (INDEPENDENT_AMBULATORY_CARE_PROVIDER_SITE_OTHER): Payer: Self-pay

## 2018-04-23 NOTE — Progress Notes (Signed)
Incoming call received from patient in response to message left for her. Patient states that she has been busy with work and taking care of her father. Reports that she missed her last 2 therapist appointments, she cancelled the second one, but was unable to cancel the first one, because it was too late when she remembered that she had missed it. Informed me that she would rather see a female therapist, because they seem to understand her better. Will inform Social Worker in order for her to assist.  Patient also informed me that she is in need of resources for her father to help with repairing his bathroom and making it more accessible, she states that he has difficulty walking, uses a walker and a cane at times and can not step into the shower because the ledge is high. She also states that she is looking for a new PCP for him, he sees a (non [place]) provider in [place] and wants to know who will take his insurance , he has  [employer] MA. Will send her a listing of [facility] PCP's, suggested that she could make him and appointment with her PCP. Also informed her that she could call the phone number on the back of his insurance card for a listing. Will inform the social worker to see if she can assist with the resources.  Will follow up in 3-4 weeks. Encouraged patient to call if I can be of assistance.          Karolee Stamps, BSN, RN-BC, [facility]  T [phone] M [phone]

## 2018-04-23 NOTE — Progress Notes (Signed)
04/23/18 1030   Pt Outreach/Care Plan Metrics   Payor Grouping for [employer]

## 2018-04-23 NOTE — Progress Notes (Signed)
04/23/18 1030   Patient Interventions This Encounter   Telephone Interventions 1   MyChart Interventions 0   In-Person Intervention 0   Other Intervention 1   Intervention Tally this encounter 2

## 2018-04-23 NOTE — Progress Notes (Signed)
04/23/18 1030   Pt Outreach/Care Plan Metrics   Payor Grouping for Outreach [employer] Status for [employer] Spoke with Member/Care [employer]

## 2018-04-23 NOTE — Progress Notes (Signed)
04/23/18 1030   SPN Nurse Navigator Follow Up Report Items   Follow Up For: Care Plan Update

## 2018-04-26 ENCOUNTER — Encounter (INDEPENDENT_AMBULATORY_CARE_PROVIDER_SITE_OTHER): Payer: Self-pay

## 2018-05-15 ENCOUNTER — Other Ambulatory Visit (INDEPENDENT_AMBULATORY_CARE_PROVIDER_SITE_OTHER): Payer: Self-pay | Admitting: Internal Medicine

## 2018-05-15 NOTE — Telephone Encounter (Signed)
Name, ALPRAZolam Prudy Feeler) 0.5 MG tablet (Order [id number])     strength, directions of requested refill(s):      Pharmacy to send refill to or patient to pick up rx from office (mark requested pharmacy in BOLD):      [id number] - [place], [place] - [address]  [address] [address] [place] [address]  Phone: [phone] Fax: [phone]        Please mark "X" next to the preferred call back number:    Mobile:   Telephone Information:   Mobile [phone]    x   Home: [phone]    Work: [phone]          Next visit: 05/30/2018

## 2018-05-16 MED ORDER — ALPRAZOLAM 0.5 MG PO TABS
ORAL_TABLET | ORAL | 0 refills | Status: DC
Start: 2018-05-16 — End: 2018-08-29

## 2018-05-27 ENCOUNTER — Encounter (INDEPENDENT_AMBULATORY_CARE_PROVIDER_SITE_OTHER): Payer: Self-pay

## 2018-05-27 NOTE — Progress Notes (Signed)
Chief Complaint   Patient presents with   . Medication Refill      62 y.o.  female presents for f/u of chronic conditions including:    BP 119/81   Pulse (!) 102   Temp 98.2 F (36.8 C) (Oral)   Ht 1.549 m (5\' 1" )   Wt 81.6 kg (180 lb)   BMI 34.01 kg/m       PROBLEM LIST:  Anxiety--trial of lexapro 10mg ; on xanax 0.5mg  qhs and trying to wean off so will do 1/2 qhs;  Tried melatonin (woke groggy). Tried prozac (2 wks); doing therapy (sister died 2022/12/06).    HTN--on losartan-hctz 100-12.5mg ; compliant with meds; no se's.  Good control  BP Readings from Last 3 Encounters:   05/30/18 119/81   01/10/18 125/82   01/01/18 113/77   Lumbar disc disease with chronic pain--on tramadol 50mg  q8 prn, gabapentin 100mg  tid; had taken vicodin, percocet prior; sees pain management.    OA--bilat knees, hips, back (LLE sciatica)  Smoker--wrote for chantix.  GERD--on mg prn?; no se's.  Good control   Allergic rhinitis--on antihistamine prn; no side effects.  Good control   Chronic insomnia--trial of trazodone 50mg  instead of xanax 0.5mg  qhs; tried ambien (headache).  Was on trazodone previously  Osteopenia--bmd 12/06/2022 Tmin -2.2, frax not done  Eczema--on betamethasone cream prn  Carpal tunnel--bilat  H/o L knee replacement  Obesity--Body mass index is 34.01 kg/m. Discussed working on diet/exercise.  Wt Readings from Last 3 Encounters:   05/30/18 81.6 kg (180 lb)   01/10/18 79.6 kg (175 lb 6.4 oz)   01/01/18 78.9 kg (174 lb)     SOCIAL HISTORY: Works with mentally handicapped.  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 Influenza (Im) Preserved TRIVALENT VACCINE 06/30/2014   . Influenza quadrivalent (IM) 3 Yrs & greater 07/15/2015   . Influenza  quadrivalent (IM) PF 3 Yrs & greater 07/31/2016, 07/23/2017   . Tdap 12/17/2008, 06/30/2014      Health Maintenance Due   Topic Date Due   . [facility] CARE PLAN LETTER  09/17/1956   . HEPATITIS C SCREENING  02/12/2007   . Shingrix Vaccine 50+ (1) 02/12/2007   . INFLUENZA VACCINE  05/19/2018     Allergies   Allergen Reactions   . Latex Rash     Lab Results   Component Value Date    WBC 5.93 11/27/2017    HGB 14.3 11/27/2017    HCT 46.4 (H) 11/27/2017    PLT 293 11/27/2017    CHOL 198 11/27/2017    TRIG 110 11/27/2017    HDL 47 11/27/2017    LDL 129 (H) 11/27/2017    ALT 37 11/27/2017    AST 33 11/27/2017    NA 141 11/27/2017    K 3.8 11/27/2017    CL 105 11/27/2017    CREAT 0.8 11/27/2017    BUN 11.0 11/27/2017    CO2 27 11/27/2017    TSH 0.60 11/27/2017    INR 1.0 02/22/2017    GLU 90 11/27/2017    HGBA1C 5.5 01/29/2017     No results found for: VITD, 25HYDROXYDTO  Review of Systems   Constitutional: Negative for fever and unexpected weight change.   HENT: Negative for congestion and postnasal drip.    Respiratory: Negative for cough, shortness of breath and wheezing.    Cardiovascular: Negative for chest pain and leg swelling.   Gastrointestinal: Negative for abdominal pain, constipation, diarrhea, nausea and vomiting.   Genitourinary: Negative for dysuria and frequency.   Psychiatric/Behavioral: Positive for dysphoric mood and sleep disturbance. The patient is nervous/anxious.      Physical Exam   Constitutional: She is oriented to person, place, and time. No distress.   Eyes: Conjunctivae are normal.   Neck: Normal range of motion. No thyromegaly present.   Cardiovascular: Normal rate, regular rhythm, normal heart sounds and intact distal pulses.  Exam reveals no gallop and no friction rub.    No murmur heard.  Pulmonary/Chest: Effort normal and breath sounds normal. She has no wheezes. She has no rales.   Abdominal: Soft. Bowel sounds are normal. She exhibits no distension and no mass. There is no tenderness.    Musculoskeletal: She exhibits no edema.   Lymphadenopathy:     She has no cervical adenopathy.   Neurological: She is alert and oriented to person, place, and time.   Skin: No rash noted.   Psychiatric: She has a normal mood and affect. Her behavior is normal.     HIgh BMI Follow Up   BMI Follow Up Care Plan Documented    ASSESSMENT/PLAN:  (See above for details)  [name] was seen today for medication refill.    Diagnoses and all orders for this visit:    Essential hypertension    Anxiety  -     escitalopram (LEXAPRO) 10 MG tablet; Take 1 tablet (10 mg total) by mouth daily    Chronic insomnia  -     traZODone (DESYREL) 50 MG tablet; Take 1 tablet (50 mg total) by mouth nightly    Lumbar disc disease    Smoker        Continue current meds for stable conditions  Symptomatic treatment reviewed in detail    Risk & Benefits of any new medication(s) were explained to the patient who verbalized understanding & agreed to the treatment plan.     Call if symptoms persist, worsen, or change.  Call with updates/questions/concerns.

## 2018-05-28 ENCOUNTER — Other Ambulatory Visit (INDEPENDENT_AMBULATORY_CARE_PROVIDER_SITE_OTHER): Payer: Self-pay

## 2018-05-28 NOTE — Progress Notes (Signed)
05/28/18 1044   SPN Nurse Navigator Follow Up Report Items   Follow Up For: Care Plan Update

## 2018-05-28 NOTE — Progress Notes (Signed)
05/28/18 1045   Patient Interventions This Encounter   Telephone Interventions 1   MyChart Interventions 0   In-Person Intervention 0   Other Intervention 0   Intervention Tally this encounter 1

## 2018-05-28 NOTE — Progress Notes (Signed)
Call placed to patient to follow up and update care plan. Patient answered phone call and states that she is doing well, however this is not a good time for her to talk. Asked to be called at a later date in the afternoon. Will call back later as requested.          Karolee Stamps, BSN, RN-BC, [facility]  T [phone] M [phone]

## 2018-05-28 NOTE — Progress Notes (Signed)
05/28/18 1044   Pt Outreach/Care Plan Metrics   Outreach Status for [employer] Spoke with Member/Care Giver

## 2018-05-30 ENCOUNTER — Ambulatory Visit (INDEPENDENT_AMBULATORY_CARE_PROVIDER_SITE_OTHER): Payer: No Typology Code available for payment source | Admitting: Internal Medicine

## 2018-05-30 ENCOUNTER — Encounter (INDEPENDENT_AMBULATORY_CARE_PROVIDER_SITE_OTHER): Payer: Self-pay | Admitting: Internal Medicine

## 2018-05-30 VITALS — BP 119/81 | HR 102 | Temp 98.2°F | Ht 61.0 in | Wt 180.0 lb

## 2018-05-30 DIAGNOSIS — F5104 Psychophysiologic insomnia: Secondary | ICD-10-CM

## 2018-05-30 DIAGNOSIS — M519 Unspecified thoracic, thoracolumbar and lumbosacral intervertebral disc disorder: Secondary | ICD-10-CM

## 2018-05-30 DIAGNOSIS — I10 Essential (primary) hypertension: Secondary | ICD-10-CM

## 2018-05-30 DIAGNOSIS — F419 Anxiety disorder, unspecified: Secondary | ICD-10-CM

## 2018-05-30 DIAGNOSIS — F17200 Nicotine dependence, unspecified, uncomplicated: Secondary | ICD-10-CM

## 2018-05-30 MED ORDER — ESCITALOPRAM OXALATE 10 MG PO TABS
10.00 mg | ORAL_TABLET | Freq: Every day | ORAL | 3 refills | Status: DC
Start: 2018-05-30 — End: 2018-06-21

## 2018-05-30 MED ORDER — TRAZODONE HCL 50 MG PO TABS
50.00 mg | ORAL_TABLET | Freq: Every evening | ORAL | 3 refills | Status: DC
Start: 2018-05-30 — End: 2018-06-21

## 2018-06-04 ENCOUNTER — Other Ambulatory Visit (INDEPENDENT_AMBULATORY_CARE_PROVIDER_SITE_OTHER): Payer: Self-pay

## 2018-06-04 NOTE — Progress Notes (Signed)
06/04/18 1509   SPN Nurse Navigator Follow Up Report Items   Follow Up For: Care Plan Update

## 2018-06-04 NOTE — Progress Notes (Signed)
06/04/18 1509   Pt Outreach/Care Plan Metrics   Outreach Status for Metrics listing Left Voicemail 1

## 2018-06-04 NOTE — Progress Notes (Signed)
06/04/18 1509   Patient Interventions This Encounter   Telephone Interventions 1   MyChart Interventions 0   In-Person Intervention 0   Other Intervention 0   Intervention Tally this encounter 1

## 2018-06-04 NOTE — Progress Notes (Signed)
Nurse Navigator placed telephone call to patient to follow up, update care plan and discuss possible care coordination needs with patient.  No answer at contact telephone number, left voicemail message to call back.          Rami Budhu, BSN, RN-BC, ONC  Nurse Navigator  [facility]  T [phone] M [phone]

## 2018-06-21 ENCOUNTER — Other Ambulatory Visit (INDEPENDENT_AMBULATORY_CARE_PROVIDER_SITE_OTHER): Payer: Self-pay | Admitting: Internal Medicine

## 2018-06-21 DIAGNOSIS — F5104 Psychophysiologic insomnia: Secondary | ICD-10-CM

## 2018-06-21 DIAGNOSIS — F419 Anxiety disorder, unspecified: Secondary | ICD-10-CM

## 2018-06-21 NOTE — Telephone Encounter (Signed)
[facility]

## 2018-06-23 MED ORDER — ESCITALOPRAM OXALATE 10 MG PO TABS
10.00 mg | ORAL_TABLET | Freq: Every day | ORAL | 1 refills | Status: DC
Start: 2018-06-23 — End: 2018-08-29

## 2018-06-23 MED ORDER — TRAZODONE HCL 50 MG PO TABS
50.00 mg | ORAL_TABLET | Freq: Every evening | ORAL | 1 refills | Status: DC
Start: 2018-06-23 — End: 2018-11-27

## 2018-06-23 NOTE — Progress Notes (Deleted)
No chief complaint on file.     62 y.o.  female presents for f/u of chronic conditions including:    There were no vitals taken for this visit.      PROBLEM LIST:  Anxiety--trial of lexapro 10mg ; on xanax 0.5mg  qhs and trying to wean off so will do 1/2 qhs;  Tried melatonin (woke groggy). Tried prozac (2 wks); doing therapy (sister died 2022-12-09).    HTN--on losartan-hctz 100-12.5mg ; compliant with meds; no se's.  Good control  BP Readings from Last 3 Encounters:   05/30/18 119/81   01/10/18 125/82   01/01/18 113/77   Lumbar disc disease with chronic pain--on tramadol 50mg  q8 prn, gabapentin 100mg  tid; had taken vicodin, percocet prior; sees pain management.    OA--bilat knees, hips, back (LLE sciatica)  Smoker--wrote for chantix.  GERD--on mg prn?; no se's.  Good control   Allergic rhinitis--on antihistamine prn; no side effects.  Good control   Chronic insomnia--trial of trazodone 50mg  instead of xanax 0.5mg  qhs; tried ambien (headache).  Was on trazodone previously  Osteopenia--bmd 12/09/22 Tmin -2.2, frax not done  Eczema--on betamethasone cream prn  Carpal tunnel--bilat  H/o L knee replacement  Obesity--There is no height or weight on file to calculate BMI. Discussed working on diet/exercise.  Wt Readings from Last 3 Encounters:   05/30/18 81.6 kg (180 lb)   01/10/18 79.6 kg (175 lb 6.4 oz)   01/01/18 78.9 kg (174 lb)     SOCIAL HISTORY: Works with mentally handicapped.  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  Taking care of elderly dad  Smoker (1/4ppd x 30+yrs), no etoh    HEALTH MAINTENANCE:  Shingrix: discussed  Prevnar:  Pneumovax:  Hep C: No results found for: HCVAB  EKG: 6/18  ASCVD Risk (06/27/2018):  CT chest: 12/05/17 nl  ASA:  BMD:   Ca/VitD:    Immunization History   Administered Date(s) Administered   . Influenza (Im) Preserved TRIVALENT VACCINE 06/30/2014   . Influenza quadrivalent (IM) 3 Yrs & greater 07/15/2015   . Influenza quadrivalent (IM) PF 3 Yrs & greater 07/31/2016, 07/23/2017   . Tdap  12/17/2008, 06/30/2014      Health Maintenance Due   Topic Date Due   . [facility] CARE PLAN LETTER  1956-06-11   . HEPATITIS C SCREENING  02/12/2007   . Shingrix Vaccine 50+ (1) 02/12/2007   . INFLUENZA VACCINE  04/18/2018     Allergies   Allergen Reactions   . Latex Rash     Lab Results   Component Value Date    WBC 5.93 11/27/2017    HGB 14.3 11/27/2017    HCT 46.4 (H) 11/27/2017    PLT 293 11/27/2017    CHOL 198 11/27/2017    TRIG 110 11/27/2017    HDL 47 11/27/2017    LDL 129 (H) 11/27/2017    ALT 37 11/27/2017    AST 33 11/27/2017    NA 141 11/27/2017    K 3.8 11/27/2017    CL 105 11/27/2017    CREAT 0.8 11/27/2017    BUN 11.0 11/27/2017    CO2 27 11/27/2017    TSH 0.60 11/27/2017    INR 1.0 02/22/2017    GLU 90 11/27/2017    HGBA1C 5.5 01/29/2017     No results found for: VITD, 25HYDROXYDTO    Review of Systems   Constitutional: Negative for fever and unexpected weight change.   HENT: Negative for congestion and postnasal drip.  Respiratory: Negative for cough, shortness of breath and wheezing.    Cardiovascular: Negative for chest pain and leg swelling.   Gastrointestinal: Negative for abdominal pain, constipation, diarrhea, nausea and vomiting.   Genitourinary: Negative for dysuria and frequency.   Psychiatric/Behavioral: Positive for dysphoric mood and sleep disturbance. The patient is nervous/anxious.      Physical Exam   Constitutional: She is oriented to person, place, and time. No distress.   Eyes: Conjunctivae are normal.   Neck: Normal range of motion. No thyromegaly present.   Cardiovascular: Normal rate, regular rhythm, normal heart sounds and intact distal pulses. Exam reveals no gallop and no friction rub.   No murmur heard.  Pulmonary/Chest: Effort normal and breath sounds normal. She has no wheezes. She has no rales.   Abdominal: Soft. Bowel sounds are normal. She exhibits no distension and no mass. There is no tenderness. Musculoskeletal:         General: No edema.     Lymphadenopathy:     She has  no cervical adenopathy.   Neurological: She is alert and oriented to person, place, and time.   Skin: No rash noted.   Psychiatric: She has a normal mood and affect. Her behavior is normal.     HIgh BMI Follow Up   BMI Follow Up Care Plan Documented    ASSESSMENT/PLAN:  (See above for details)  There are no diagnoses linked to this encounter.    Continue current meds for stable conditions  Symptomatic treatment reviewed in detail    Risk & Benefits of any new medication(s) were explained to the patient who verbalized understanding & agreed to the treatment plan.     Call if symptoms persist, worsen, or change.  Call with updates/questions/concerns.

## 2018-06-27 ENCOUNTER — Other Ambulatory Visit (INDEPENDENT_AMBULATORY_CARE_PROVIDER_SITE_OTHER): Payer: Self-pay

## 2018-06-27 ENCOUNTER — Ambulatory Visit (INDEPENDENT_AMBULATORY_CARE_PROVIDER_SITE_OTHER): Payer: No Typology Code available for payment source | Admitting: Internal Medicine

## 2018-06-27 NOTE — Progress Notes (Signed)
Call placed to patient to follow up and update care plan. 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 Will inform Child psychotherapist to assist. States that her PCP ordered her Trazodone and Lexapro. Reports that she has been sleeping much better with the Trazodone, and she does not wake up feeling groggy like with some other medications that she has taken in the past. States that the Lexapro made her heart race and made her feel more anxious and so she stopped taking it after about 2 weeks - on 06/13/18. Reports that she was due to see her PCP today and discuss it, however, her appointment was rescheduled for next week and so she will inform her then. Denies having any palpitations, chest pain or increased anxiety at present.   Will inform Social Worker about patient needing assistance to find a female therapist. Will follow up in about 3 weeks. Patient encouraged to call if I can be of assistance to her.           Karolee Stamps, BSN, RN-BC  [facility]  T [phone] M [phone]

## 2018-06-27 NOTE — Progress Notes (Signed)
06/27/18 1632   Pt Outreach/Care Plan Metrics   Payor Grouping for Outreach [employer] Status for [employer] Spoke with Member/Care [employer]

## 2018-06-27 NOTE — Progress Notes (Signed)
06/27/18 1633   SPN Nurse Navigator Follow Up Report Items   Follow Up For: Care Plan Update

## 2018-07-02 ENCOUNTER — Other Ambulatory Visit (INDEPENDENT_AMBULATORY_CARE_PROVIDER_SITE_OTHER): Payer: Self-pay

## 2018-07-02 NOTE — Progress Notes (Signed)
SW received referral from NN, [name] regarding her interest in finding a therapist. SW has supported [name] in the past to find a therapist. However, she has expressed interest in finding a female therapist for anxiety.     SW placed outreach call to [name] to follow up. She was at an eye appointment, and informed SW that she will call back. She has SW's contact number saved in her phone.       Carmie End, MSW  Social Worker CM 1  [facility]   [address], [address]  [place], [place]  [address]  T [phone]   F [phone]  [email or web address]

## 2018-07-04 ENCOUNTER — Other Ambulatory Visit (INDEPENDENT_AMBULATORY_CARE_PROVIDER_SITE_OTHER): Payer: Self-pay

## 2018-07-04 NOTE — Progress Notes (Signed)
SW placed outreach call to [name] and left v/m requesting return call.    Carmie End, MSW  Social Worker CM 1  [facility]   [address]  [place], [place]  [address]  T [phone]   F [phone]  [email or web address]

## 2018-07-06 NOTE — Progress Notes (Signed)
Chief Complaint   Patient presents with   . Anxiety     Was supposed to start Lexapro, pt states that she did not like the way it made her feel. Pt states that she took it for about 4 weeks and was only taking 1/2 tablet.       62 y.o.  female presents for f/u of chronic conditions including:    BP 131/83   Pulse 95   Temp 97.9 F (36.6 C) (Oral)   Wt 82.1 kg (181 lb)   BMI 34.20 kg/m     PROBLEM LIST:  Anxiety--tried lexapro 10mg  (palpitations, felt funny); off xanax 0.5mg  qhs;  Tried melatonin (woke groggy). Tried prozac (2 wks); doing therapy (sister died 12-25-2022).    HTN--on losartan-hctz 100-12.5mg ; compliant with meds; no se's.  Good control  BP Readings from Last 3 Encounters:   07/09/18 131/83   05/30/18 119/81   01/10/18 125/82   Lumbar disc disease with chronic pain--on tramadol 50mg  q8 prn, gabapentin 100mg  tid; had taken vicodin, percocet prior; sees pain management.    OA--bilat knees, hips, back (LLE sciatica)  Smoker--wrote for chantix.   GERD--trial of prilosec otc x 2 wks; pepcid prn.  Trying to pay attention to diet and what triggers her.  If not improving, will pursue abd ct.  Allergic rhinitis--on antihistamine prn; no side effects.  Good control   Chronic insomnia--on trazodone 50mg  qhs; tried ambien (headache).  Works well.   Osteopenia--bmd 25-Dec-2022 Tmin -2.2, frax not done  Eczema--on betamethasone cream prn  Carpal tunnel--bilat  H/o L knee replacement  Obesity--Body mass index is 34.2 kg/m. Discussed working on diet/exercise.  Wt Readings from Last 3 Encounters:   07/09/18 82.1 kg (181 lb)   05/30/18 81.6 kg (180 lb)   01/10/18 79.6 kg (175 lb 6.4 oz)     SOCIAL HISTORY: Works with mentally handicapped.  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  Taking care of elderly dad  Smoker (1/4ppd x 30+yrs), no etoh    HEALTH MAINTENANCE:  Shingrix: discussed  Prevnar:  Pneumovax:  Hep C: No results found for: HCVAB  EKG: 6/18  ASCVD Risk (07/09/2018):  CT chest: 12/05/17 nl  ASA:  BMD:    Ca/VitD:    Immunization History   Administered Date(s) Administered   . Influenza (Im) Preserved TRIVALENT VACCINE 06/30/2014   . Influenza Vacc QUAD Recombinant PF 38yrs & up 07/09/2018   . Influenza quadrivalent (IM) 3 Yrs & greater 07/15/2015   . Influenza quadrivalent (IM) PF 3 Yrs & greater 07/31/2016, 07/23/2017   . Tdap 12/17/2008, 06/30/2014      Health Maintenance Due   Topic Date Due   . [facility] CARE PLAN LETTER  October 03, 1955   . HEPATITIS C SCREENING  02/12/2007   . Shingrix Vaccine 50+ (1) 02/12/2007   . INFLUENZA VACCINE  04/18/2018     Allergies   Allergen Reactions   . Latex Rash     Lab Results   Component Value Date    WBC 5.93 11/27/2017    HGB 14.3 11/27/2017    HCT 46.4 (H) 11/27/2017    PLT 293 11/27/2017    CHOL 198 11/27/2017    TRIG 110 11/27/2017    HDL 47 11/27/2017    LDL 129 (H) 11/27/2017    ALT 37 11/27/2017    AST 33 11/27/2017    NA 141 11/27/2017    K 3.8 11/27/2017    CL 105 11/27/2017    CREAT  0.8 11/27/2017    BUN 11.0 11/27/2017    CO2 27 11/27/2017    TSH 0.60 11/27/2017    INR 1.0 02/22/2017    GLU 90 11/27/2017    HGBA1C 5.5 01/29/2017     No results found for: VITD, 25HYDROXYDTO    Review of Systems   Constitutional: Negative for fever and unexpected weight change.   HENT: Negative for congestion and postnasal drip.    Respiratory: Negative for cough, shortness of breath and wheezing.    Cardiovascular: Negative for chest pain and leg swelling.   Gastrointestinal: Positive for abdominal pain. Negative for constipation, diarrhea, nausea and vomiting.   Genitourinary: Negative for dysuria and frequency.   Psychiatric/Behavioral: Negative for dysphoric mood and sleep disturbance. The patient is not nervous/anxious.      Physical Exam   Constitutional: She is oriented to person, place, and time. No distress.   Eyes: Conjunctivae are normal.   Neck: Normal range of motion. No thyromegaly present.   Cardiovascular: Normal rate, regular rhythm, normal heart sounds and intact distal  pulses. Exam reveals no gallop and no friction rub.   No murmur heard.  Pulmonary/Chest: Effort normal and breath sounds normal. She has no wheezes. She has no rales.   Abdominal: Soft. Bowel sounds are normal. She exhibits no distension and no mass. There is tenderness (mild LUQ).   Musculoskeletal:         General: No edema.   Lymphadenopathy:     She has no cervical adenopathy.   Neurological: She is alert and oriented to person, place, and time.   Skin: No rash noted.   Psychiatric: She has a normal mood and affect. Her behavior is normal.     HIgh BMI Follow Up   BMI Follow Up Care Plan Documented    ASSESSMENT/PLAN:  (See above for details)  [name] was seen today for anxiety.    Diagnoses and all orders for this visit:    Anxiety    Essential hypertension    Smoker    Chronic insomnia    Generalized abdominal pain  -     CT Abdomen WO IV/ W PO Cont    Need for vaccination  -     Flu vacc quad recombinant pres free 18 yrs& up        Continue current meds for stable conditions  Symptomatic treatment reviewed in detail    Risk & Benefits of any new medication(s) were explained to the patient who verbalized understanding & agreed to the treatment plan.     Call if symptoms persist, worsen, or change.  Call with updates/questions/concerns.

## 2018-07-09 ENCOUNTER — Encounter (INDEPENDENT_AMBULATORY_CARE_PROVIDER_SITE_OTHER): Payer: Self-pay | Admitting: Internal Medicine

## 2018-07-09 ENCOUNTER — Encounter (INDEPENDENT_AMBULATORY_CARE_PROVIDER_SITE_OTHER): Payer: Self-pay

## 2018-07-09 ENCOUNTER — Ambulatory Visit (INDEPENDENT_AMBULATORY_CARE_PROVIDER_SITE_OTHER): Payer: No Typology Code available for payment source | Admitting: Internal Medicine

## 2018-07-09 ENCOUNTER — Other Ambulatory Visit (INDEPENDENT_AMBULATORY_CARE_PROVIDER_SITE_OTHER): Payer: Self-pay

## 2018-07-09 VITALS — BP 131/83 | HR 95 | Temp 97.9°F | Wt 181.0 lb

## 2018-07-09 DIAGNOSIS — F17200 Nicotine dependence, unspecified, uncomplicated: Secondary | ICD-10-CM

## 2018-07-09 DIAGNOSIS — F419 Anxiety disorder, unspecified: Secondary | ICD-10-CM

## 2018-07-09 DIAGNOSIS — Z23 Encounter for immunization: Secondary | ICD-10-CM

## 2018-07-09 DIAGNOSIS — R1084 Generalized abdominal pain: Secondary | ICD-10-CM

## 2018-07-09 DIAGNOSIS — F5104 Psychophysiologic insomnia: Secondary | ICD-10-CM

## 2018-07-09 DIAGNOSIS — I10 Essential (primary) hypertension: Secondary | ICD-10-CM

## 2018-07-09 NOTE — Patient Instructions (Signed)
GERD Precautions    1. Watch what you eat. Some foods increase the amount of stomach acid or relax the lower esophageal sphincter.  Avoid drinking/eating the following foods:   . Coffee, tea and carbonated drinks (with and without caffeine)   . Spicy or fatty foods   . Fried foods   . Citrus fruits and tomatoes   . Onions   . Peppermint   . Chocolate    2. Avoid alcohol and tobacco products.    3. Eat six smaller meals rather than three large meals. This makes it easier to hold food in your stomach without having it overflow into the esophagus.    4. Raise the head of your bed 4 to 6 inches.  Place a block of wood under the legs at  the head of your bed or place a wedge under the mattress at the head of the bed.    5. Don't lie down right after eating. Don't eat anything three hours before bedtime. This will allow your stomach to empty before you lie down.    6. Don't wear tight fitting clothes or tight belts. This places extra pressure on your stomach.    7. If you are overweight, lose weight. Being overweight places extra pressure on your  stomach.    8. Sleep on your left side if possible.

## 2018-07-09 NOTE — Progress Notes (Signed)
Client: [name], [name]    Date of Birth: 11/11/1955      Presenting Problem:     [name] is a 62 year old female, referred to the [facility] for assistance in finding a female therapist. [name] previously obtained Social Worker support to find a Paramedic in March, 2019. She has expressed interest in reconnecting with behavioral health services. 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 Her sister passed away earlier this year from stage 4 cancer, and she has lost several other close family members over the years.  In April, 2019, she had an initial visit with [facility]ician, Dustin Folks, PHD, [facility] for therapy. She was unable to make subsequent appointments and later attempted to reconnect without successful. [name] would like to find a female therapist to assist her in managing her symptoms of depression and anxiety.      Social History/Family History:   [name] is the mother of 6 children, one of which passed away. [name]' mother passed away 11 years ago and she is the caregiver for her elderly father. She has one sister, who is alive, as well as two sons and two daughters.        Significant relationships:  [name]' aunt, cousin, and supervisor have been a source of support. She also has a close relationship with her sons and one of her daughters.       Living Situation:   [name] resides with her two sons and also takes care of her father.    Medical History: (illness, major surgeries, allergies, medications)     Past Medical History:   Diagnosis Date   . Abnormal vision     glasses   . Anxiety     uses xanax @ bedtime   . Arthritis     bilateral knees/OA hips/back   . Carpal tunnel syndrome on both sides     stiffness noted    . Difficulty walking     limps   . Eczema     has prescription cream   . Fracture     finger   . Gastroesophageal reflux disease     heartburn  recently diet related/some problem swallowing   . History of gonorrhea    . Hypertensive disorder     stable meds   . Lower back pain     sciatica radiates down LLE: Pain range= 0-10   . Neck pain     related to lower back but significant at this time    . Neuropathy     numbness upon awakening from low back radiating down LLE .Marland Kitchen. diminishes after awhile after moving around   . Pneumonia     bronchitis in past   . 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          Mental Health History:     Has a history of depression and anxiety.     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     Financial History:     [name] stated that she has a Firefighter, who has assisted her in developing an expenditure budget. She is able to manage her money and no longer has financial concerns. She has [employer] and [employer] life insurance.       Legal  History: (Legal problems, guardianship, power of attorney)     [name] would like to obtain materials on Advance Care Planning.     Suicide Assessment:    Thoughts of killing self: No    Current plan:     Previous attempts:     Means:     High risk behaviors/Risk factors:     *Self-injurious behaviors:     Is there a suicide risk? ___ Yes __x_ No      Homicide Assessment:    Thoughts of harming others:  ___ Yes _x__ No    If yes: Who:     Current plan:     Previous attempts:    Means:     High risk behaviors:       Coping Skills: [name] has been coping by connecting with her natural supports.       Interest/Hobbies: [name] enjoys spending time with her aunt and cousin on weekends. She also enjoys doing to dinner, as well as going on vacation and other trips.        Spirituality/Religious Affiliations/Cultural Assessment: [name] is a member of a nearby church.                                               Advanced Directive: ___Yes   _x___No    Functional Ability:     Identify areas of Concern  ___ None  ___ Safety   ___ Activities of daily living ___ Legal   ___ Work/school ___ Physical health   ___ Finances  ___ Housing   ___ Family relationships _x__ Behavioral health   ___ Social relationships ___ Transportation   ___ Other            Carmie End, MSW  Social Worker CM 1  [facility]   [address], [address]  [place], [place]  [address]  T [phone]   F [phone]  [email or web address]

## 2018-07-11 ENCOUNTER — Other Ambulatory Visit (INDEPENDENT_AMBULATORY_CARE_PROVIDER_SITE_OTHER): Payer: Self-pay

## 2018-07-11 NOTE — Progress Notes (Addendum)
SW Care Plan                                  Name: [name]                                                         MRN:  [id number]                                            DOB: March 22, 1956                                                               PCP:   Dr. Sonnie Alamo          Advance Directives:  N      Summary:   [name] was referred to the [facility] for assistance in finding a female therapist. [name] previously obtained Social Worker support to find a therapist in March, 2019. She has expressed interest in reconnecting with behavioral health services. 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 Her sister passed away earlier this year from stage 4 cancer, and she has lost several other close family members over the years.            Goal:     -[name] would like to find a female behavioral health provider to assist her in managing her symptoms of depression and anxiety.        Current Action Items:     SW placed call to various therapist to inquire about services for [name].    - [facility] [place], [name] [phone]): SW called to inquire about new patient appointment with Kathryn Dell, LCSW.  All providers at practice are not accepting new patients at this time.     -Kathryn Semerano, LCSW [phone]): SW left v/m requesting return call.     -[facility] ( [phone]): SW called to inquire about scheduling appointment with Kathryn Adie LCSW-C. She is only accepting virtual appointments. Other licensed providers are LPCs are not accepting new clients. Resident Therapist [name] has availability for new patients.     -   Behavioral Therapy Intake Line [phone]): Client would be placed on waitlist for 30 days. Availability is contingent on cancellations for specific office location.     -Veleta Miners, LCSW [phone]): V/m states that she is not accepting new  clients.     Kathryn Else, LCSW [phone]): [facility] therapist.    Kathryn [phone]) and left message for [name], Psy. D ( Ext. [phone]) requesting return call.       SW received return v/m from [name] stating that she is not accepting new patients at this time.    Follow-up:     SW will continue to provide SW support.        Carmie End, MSW  Social  Worker CM 1  [facility]   [address], [address]  [place], [place]  [address]  T [phone]   F [phone]  [email or web address]

## 2018-07-12 ENCOUNTER — Other Ambulatory Visit (INDEPENDENT_AMBULATORY_CARE_PROVIDER_SITE_OTHER): Payer: Self-pay

## 2018-07-12 NOTE — Progress Notes (Signed)
SW sent [facility] referral request to Dr. Milas Gain via in basket for Ms. Maiden. SW requested to have Zara Council, LCSW as provider [facility] location).    Carmie End, MSW  Social Worker CM 1  [facility]   [address], [address]  [place], [place]  [address]  T [phone]   F [phone]  [email or web address]

## 2018-07-15 ENCOUNTER — Other Ambulatory Visit (INDEPENDENT_AMBULATORY_CARE_PROVIDER_SITE_OTHER): Payer: Self-pay

## 2018-07-15 ENCOUNTER — Telehealth (INDEPENDENT_AMBULATORY_CARE_PROVIDER_SITE_OTHER): Payer: Self-pay | Admitting: Internal Medicine

## 2018-07-15 DIAGNOSIS — F418 Other specified anxiety disorders: Secondary | ICD-10-CM

## 2018-07-15 NOTE — Telephone Encounter (Signed)
-----   Message from Carmie End sent at 07/12/2018  2:19 PM EDT -----  Regarding: Intergrated Behavioral Health Referral  Hello Dr. Milas Gain,    I am currently working with your patient Ms. Szeliga. She has expressed interest in reconnecting with therapy services for depression and anxiety. Please submit a referral for the Integrated Behavioral Health provider Zara Council, [place], at the [facility] site. Thank you.    Carmie End, MSW  Social Worker CM 1  [facility]   [address], [address]  [place], [place]  [address]  T [phone]   F [phone]  [email or web address]

## 2018-07-15 NOTE — Progress Notes (Signed)
SW placed call to Ms. Wixom to inform her about referral to [facility] provider, Zara Council, LCSW. SW left v/m requesting return call.    Carmie End, MSW  Social Worker CM 1  [facility]   [address], [address]  [place], [place]  [address]  T [phone]   F [phone]  [email or web address]

## 2018-07-16 ENCOUNTER — Telehealth (INDEPENDENT_AMBULATORY_CARE_PROVIDER_SITE_OTHER): Payer: Self-pay

## 2018-07-16 ENCOUNTER — Other Ambulatory Visit (INDEPENDENT_AMBULATORY_CARE_PROVIDER_SITE_OTHER): Payer: Self-pay

## 2018-07-16 NOTE — Progress Notes (Signed)
SW placed call to Ms. Som and informed her of referral to [facility]. She expressed that she is doing well today. Support is still needed to assist her in managing life's stressors.    SW will continue to follow.    Carmie End, MSW  Social Worker CM 1  [facility]   [address], [address]  [place], [place]  [address]  T [phone]   F [phone]  [email or web address]

## 2018-07-16 NOTE — Telephone Encounter (Signed)
Spoke with pt. To schedule an initial assessment appointment with Integrated BH therapy with this writer on 07/24/18 at 11 AM.

## 2018-07-17 ENCOUNTER — Other Ambulatory Visit (INDEPENDENT_AMBULATORY_CARE_PROVIDER_SITE_OTHER): Payer: Self-pay

## 2018-07-17 NOTE — Progress Notes (Signed)
07/17/18 1125   Pt Outreach/Care Plan Metrics   Payor Grouping for Outreach [employer] Status for [employer] Spoke with Member/Care [employer]

## 2018-07-17 NOTE — Progress Notes (Signed)
Call placed to patient to follow up and update care plan. Patient states that her father has some health issues and it's causing her some stress, but she is doing ok. Reports that she has an appointment to see a new BH Therapist on 07/24/18, and she is looking forward to the appointment. Denies having any care coordination needs at present. Will follow up, patient encouraged to call if I can be of assistance to her.            Karolee Stamps, BSN, RN-BC  [facility]  T [phone] M [phone]

## 2018-07-17 NOTE — Progress Notes (Signed)
07/17/18 1126   Patient Interventions This Encounter   Telephone Interventions 1   MyChart Interventions 0   In-Person Intervention 0   Other Intervention 0   Intervention Tally this encounter 1

## 2018-07-17 NOTE — Progress Notes (Signed)
07/17/18 1126   SP[facility] Nurse Navigator Follow Up Report Items   Follow Up For: Care Plan Update

## 2018-07-24 ENCOUNTER — Ambulatory Visit (INDEPENDENT_AMBULATORY_CARE_PROVIDER_SITE_OTHER): Payer: No Typology Code available for payment source

## 2018-07-24 ENCOUNTER — Other Ambulatory Visit (INDEPENDENT_AMBULATORY_CARE_PROVIDER_SITE_OTHER): Payer: Self-pay

## 2018-07-24 DIAGNOSIS — F419 Anxiety disorder, unspecified: Secondary | ICD-10-CM

## 2018-07-24 NOTE — Progress Notes (Signed)
[facility] Integrated Behavioral Health Initial Functional Assessment     07/24/2018    [name], a 62 y.o. female, for initial evaluation visit.    Start:  11:25 AM  End:   12:30 PM      Reason for Referral  [name] presents today for an initial consultation and assessment visit following referral by Dr. Milas Gain for anxiety, stress management, and issues in sleep.       Main current concern: Patient described their main current difficulty as issues in managing stress and anxiety at home and work. She reports taking care of her elderly father following recent decline in health and illness with little to no help from family. She also reported incurring multiple losses in the past 10 years, including her mother, husband, sister, and eldest son. Patient also reported elevated feelings of irritability, describing feeling overwhelmed and nervous regarding multiple responsibilities. She also reports loss of interest in pleasurable activities, crying spells, issues in memory, impulsivity, excessive energy, and fatigue.    Major Presenting Concerns:  Loss of interest  Impulsivity   Fatigue  Excessive Energy  Irritability       Current duration: Most days  Current frequency: Most days  Current intensity: 8    PHQ-9 score: 16  GAD-7 score: 11       History of present concerns:  Patient reports the problem started approximately September 23rd following her Father's decline in health and multiple illnesses   Perceived triggers: Father's decline in health and sister's death in 05-31-23  Patient reports the problem has been staying the same over time.     Impact of symptoms upon functioning:  Difficulty sleeping, stomach ache/gurd (some improvement), stress, loneliness, and feeling unhappy.       Coping Strategies:   Patient reports she plans on taking a vacation this month and has meaningful connections with family, friends, and her supervisor, as well as being able to remove herself from a situation contributing to  elevated feelings of irritability. Patient reports having no anger coping skills.       Relevant Personal and Family History:  Patient reports living with her Father (20) in order to become a care taker, son (47), daughter (72), and granddaughter (55), and daughter's dog. Patient reports her daughter's boyfriend at times also lives in the home. Parent-child relational problems with her daughter were described. Patient described issues in frustration with care taking for her father and relationship with her father.     Current Daily functioning and Health habits   How do spend your time during the day? When not working (patient reports working 6 days a week) and caring for her Father (medical appointments, bills), she naps.    Any problems with sleep, diet or regular exercise? Issues in sleep hygiene were described by the patient, notably issues in falling asleep due to elevated feelings of anxiety and anxious thoughts. Patient reports walking often at work and having two meals a day. She reports regular consumption of water and herbal tea.     Any concerns about your alcohol, caffieine, tobacco or substance use? How often do you consume these?   Patient denies alcohol consumption  Patient reports rare consumption of caffeine  Patient reports smoking one pack of cigarettes in three days       Past Mental Health History:   Previous Mental HealthTreatment/Diagnosis: yes, patient was admitted to [facility] for Substance Abuse treatment at the [facility]. Patient reports being sober for 22  years. She reports she has since seen multiple therapists at different stages in her life due to changes in lifestyle.   Current/Previous Psychiatrist: Approximately 16 years ago  Current/Previous Therapist: None   Previous hospitalizations: Approximately 30 years ago at [facility] for SA treatment   Current psychotropic medications:   Xanax (past, Dr. Milas Gain)  Prozac (past, Dr. Milas Gain)  Elsie Amis (past, Dr.  Milas Gain)  Trazadone (current, Dr. Milas Gain) - for anxiety at night; Patient reports taking as PRN instead as prescribed by physician.   Past psychotropic medications:    Family History of Mental Illness:  Father - alcoholism  Mother - medication for anxiety and depression  Sister - [employer], Depressed    Risk Assessment   Suicidal ideation: No  Suicidal intent:  No  Suicidal plan: No  History of attempt (s): No  Past or current self-harming behavior: Denied    Homicidal ideation:    Do you have thoughts of harming someone else?  No  Do you have a plan to harm anyone else at this time:  No    *Safety Planning (if pt endorsed any SI/HI statements)     []  Assessed and explored patient's thoughts suicidal ideation, self-harm.  Not considered an active concern at this time     []  The patient is considered at moderate risk for suicide, but not to the degree that requires immediate inpatient hospitalization. Patient agreed to safety plan which includes calling 911 and/or going to ER if needed.       []  Patient is considered at high risk of harm to self/others.  Patient sent for further evaluation with emergency services.         Assessment Summary:  Pt is a 62 year old F from [place], [place]. Currently presents with symptoms that appear to be consistent with a DSM-5 diagnosis of Generalized Anxiety Disorder  Main concerns appear precipitated by: Care taking for elderly Father  Maintained by:  Protective factors and current strengths: Caring, resilient, versatile, easily adapts to situations      Assessment Outcome:  Pt has been advised on the following treatment options:      Pt agreed to the following treatment goals and recommendations:                    [x]  Increase positive enjoyable/meaningful activities (behavioral activation)          [x]  Develop coping skills (e.g. deep breathing, relaxation)         [x]  Develop and practice anxiety/depression/anger management skills        [x]  Practice sleep hygiene         []  Practice  smoking cessation skills               []  Improve functioning and prevent recurrence or worsening of symptoms        []  Increase medication adherence        [x]  Developing healthy boundaries with self and others            [x]  Self Care    Plan for follow-up        [x]  Follow-up visit with integrated BH services scheduled for 07/31/18, 08/14/18        []  Patient referred back to PCP for   (monitoriing, psychotropic medication trial)        []  Patient referred to higher level of care (outpatient psychotherapy services,     psychiatry, PHP, IOP, IPAC or emergency room for further  evaluation)            []  Patient referred to Care Navigation for assistance with community resources         []  Other:           Annamary Rummage, [facility]

## 2018-07-24 NOTE — Progress Notes (Signed)
SW received incoming call from Ms. Staib requesting name and address for [facility] provider. She has a scheduled appt today at 11am. SW contacted Charlena Cross, Patient Care Coordination Manager at [facility], who confirmed that appt is with Chandni "Channu" Sharol Given at [facility] [employer]. Ms. Polyakov' PCP is also located at site. SW placed follow up call to Ms. Yankovich and provided the above information.    Carmie End, MSW  Social Worker CM 1  [facility]   [address]  [place], [place]  [address]  T [phone]   F [phone]  [email or web address]

## 2018-07-29 ENCOUNTER — Other Ambulatory Visit (INDEPENDENT_AMBULATORY_CARE_PROVIDER_SITE_OTHER): Payer: Self-pay

## 2018-07-29 NOTE — Progress Notes (Addendum)
SW sent referral to [facility] to have advanced care planning materials mailed to Ms. Bellucci for review. She previously received 5 wishes booklet through her life insurance company, but would like receive it again.    SW placed follow up call to Ms. Waas to follow up on IBH appointment. As SW was leaving v/m, Ms. Trabucco returned call. She had a positive experience with the [facility] therapist and will continue to see her. She has no other needs from SW at this time. SW encouraged her to reach out in the future if needed. She has SW's contact information.    Carmie End, MSW  Social Worker CM 1  [facility]   [address]  [place], [place]  [address]  T [phone]   F [phone]  [email or web address]

## 2018-07-31 ENCOUNTER — Ambulatory Visit (INDEPENDENT_AMBULATORY_CARE_PROVIDER_SITE_OTHER): Payer: No Typology Code available for payment source

## 2018-07-31 ENCOUNTER — Encounter (INDEPENDENT_AMBULATORY_CARE_PROVIDER_SITE_OTHER): Payer: Self-pay

## 2018-07-31 DIAGNOSIS — F419 Anxiety disorder, unspecified: Secondary | ICD-10-CM

## 2018-07-31 NOTE — Progress Notes (Signed)
Name:  [name] 12/31/1955 Medical Records: [id number]    Time in: 11  AM         Time out:  12   PM    Therapy session number:  1    Treatment goals: improving anxiety, anger management, stress management    DATA:     Met with patient and discussed social, emotional and behavioral functioning. Patient reports no changes or improvements of symptoms associated with anger, anxiety, and stress since last session.  Patient attributes this to no change in environment,continued family relational and interpersonal issues, or interventions made. The main topic discussed by the patient in this session was of ongoing family relational issues and feelings of burn out. She processed feelings of agitation when at home and work, sharing responsibilities in and out of the home as well as communication issues within the home.     She continues to experience symptoms of:    Anxiety Disorder, Unspecified:  restlessness. fatigue, irritability, and concentration/forgetfulness.     She denied suicidal ideation/homicidal ideation/self-harming behaviors.  She denied ETOH or alcohol abuse/dependency.       Interpersonal, solution focused and support therapy strategies were implemented.    ASSESSMENT:   She continues to meet criteria for the Anxiety disorder, unspecified anxiety disorder type .     PHQ-9 score: 14   GAD-7 score: 15    PHQ-9 score 16 from 07/24/18 to 14 since 07/31/18. GAD-7 11 from 07/24/18 to 15.    PLAN:     Treatment Plan and Goals:    Patient recommendations:        [x]  Increase positive enjoyable/meaningful activities (behavioral activation)         [x]  Utilize coping skills        []  Identify triggers and maintaining factors to current sx        [x]  Practice sleep hygiene         []  Practice smoking cessation skills               []  Increase medication adherence                      []  Other: managing feelings of anger        ProgressTowards Goals - in progress  Outcome of today's visit:        [x]  Implement  today's recommendations and follow up in 2 weeks.        []  No further visits scheduled at this time     [facility]

## 2018-08-07 ENCOUNTER — Encounter (INDEPENDENT_AMBULATORY_CARE_PROVIDER_SITE_OTHER): Payer: Self-pay

## 2018-08-14 ENCOUNTER — Ambulatory Visit (INDEPENDENT_AMBULATORY_CARE_PROVIDER_SITE_OTHER): Payer: No Typology Code available for payment source

## 2018-08-14 ENCOUNTER — Telehealth (INDEPENDENT_AMBULATORY_CARE_PROVIDER_SITE_OTHER): Payer: Self-pay

## 2018-08-14 ENCOUNTER — Other Ambulatory Visit (INDEPENDENT_AMBULATORY_CARE_PROVIDER_SITE_OTHER): Payer: Self-pay

## 2018-08-14 NOTE — Progress Notes (Signed)
08/14/18 1219   SPN Nurse Navigator Follow Up Report Items   Follow Up For: Care Plan Update

## 2018-08-14 NOTE — Progress Notes (Signed)
08/14/18 1220   Patient Interventions This Encounter   Telephone Interventions 1   MyChart Interventions 0   In-Person Intervention 0   Other Intervention 0   Intervention Tally this encounter 1

## 2018-08-14 NOTE — Progress Notes (Signed)
08/14/18 1219   Pt Outreach/Care Plan Metrics   Payor Grouping for Outreach [employer] Status for [employer] Spoke with Member/Care [employer]

## 2018-08-14 NOTE — Telephone Encounter (Signed)
Spoke with pt on 08/14/18 at 11:25 PM to discuss scheduling and pt no-show for appointment. Pt reported forgetting the appointment. Rescheduled for 2 PM 08/14/18

## 2018-08-14 NOTE — Progress Notes (Signed)
Call placed to patient to follow up and update care plan. Patient states that she is doing well. Reports that she has been to see the new therapist twice. Informed me that she is at an appointment with her father and asked to be called back later. Will follow up with patient at a later date. Patient encouraged to call if I can be of assistance to her.            Karolee Stamps, BSN, RN-BC  [facility]  T [phone] M [phone]

## 2018-08-19 ENCOUNTER — Telehealth (INDEPENDENT_AMBULATORY_CARE_PROVIDER_SITE_OTHER): Payer: Self-pay

## 2018-08-19 NOTE — Telephone Encounter (Signed)
Writer attempted to contact pt to set up follow-up appointment with [facility]. Writer spoke with patient and scheduled for 08/21/18 at 10 AM - patient confirmed appointment time.

## 2018-08-21 ENCOUNTER — Ambulatory Visit (INDEPENDENT_AMBULATORY_CARE_PROVIDER_SITE_OTHER): Payer: No Typology Code available for payment source

## 2018-08-21 DIAGNOSIS — F419 Anxiety disorder, unspecified: Secondary | ICD-10-CM

## 2018-08-21 NOTE — Progress Notes (Signed)
Name:  [name] 1956-05-29 Medical Records: [id number]    Time in: 10:00  AM         Time out:  10:50  AM    Therapy session number:  2    Treatment goals: stress management    DATA:     Met with patient and discussed social, emotional and behavioral functioning. Patient reports little improvement of symptoms associated with stress since last session.  Patient attributes this to increased time for self-care. She identified and discussed stressors as well as positive events from a recent vacation.  Patient denied completing the homework assignment to identify alternative housing arrangement for self. Patient noted failure to self-prioritize and set boundaries with self and others has been problematic.The main issue discussed by the patient in this session was of family issues. Issues in setting boundaries were shared by the patient. She also discussed challenges in caring for her father. In this session, patient processed feelings of anger and resentment. Therapist and patient discussed importance of boundary setting for self-care, decreasing elevated feelings of anger and stress. Patient explored root of boundary issues from a young age. She worked on exploring ways she can set verbal and physical boundaries with self and family. Patient also worked on exploring and discussing her life goals for 56.    Plan to follow up with patient on boundary setting with self and family, including alternative housing arrangements, as well as acquiring executive functioning tools (e.g. planner)      She continues to experience symptoms of:    Anxiety Disorder, Unspecified:  irritability.    She denied suicidal ideation/homicidal ideation/self-harming behaviors.  She denied ETOH or alcohol abuse/dependency.       Interpersonal, solution focused and support therapy strategies were implemented.    ASSESSMENT:   She continues to meet criteria for the GAD (generalized anxiety disorder)     PHQ-9 score: 10   GAD-7 score:  6    PHQ-9 score 14 from 07/31/2018 to 10 since present. GAD-7 15 from 07/31/2018 to 6.    PLAN:     Treatment Plan and Goals:    Patient recommendations:        [x]  Increase positive enjoyable/meaningful activities (behavioral activation): time spent with social supports         [x]  Utilize coping skills: boundary setting        []  Identify triggers and maintaining factors to current sx        []  Practice sleep hygiene         []  Practice smoking cessation skills               []  Increase medication adherence                      [x]  Other: utilize executive functioning tools (e.g. planner) to aid in self-care        ProgressTowards Goals - in progress  Outcome of today's visit:        [x]  Implement today's recommendations and follow up in 1 week        []  No further visits scheduled at this time     [facility]

## 2018-08-26 ENCOUNTER — Encounter (INDEPENDENT_AMBULATORY_CARE_PROVIDER_SITE_OTHER): Payer: Self-pay

## 2018-08-28 ENCOUNTER — Ambulatory Visit (INDEPENDENT_AMBULATORY_CARE_PROVIDER_SITE_OTHER): Payer: No Typology Code available for payment source

## 2018-08-28 DIAGNOSIS — F419 Anxiety disorder, unspecified: Secondary | ICD-10-CM
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
## 2018-08-28 NOTE — Progress Notes (Signed)
Chief Complaint   Patient presents with   . Hypertension      62 y.o.  female presents for f/u of chronic conditions including:    BP 132/79 (BP Site: Left arm, Patient Position: Sitting)   Pulse (!) 106   Temp 98.1 F (36.7 C) (Oral)   Wt 81.7 kg (180 lb 3.2 oz)   BMI 34.05 kg/m       PROBLEM LIST:  Anxiety--tried lexapro 10mg  (palpitations, felt funny); off xanax 0.5mg  qhs;  Tried melatonin (woke groggy). Tried prozac (2 wks); doing therapy (sister died 12/21/2022).    HTN--on losartan-hctz 100-12.5mg ; compliant with meds; no se's.  Good control  BP Readings from Last 3 Encounters:   08/29/18 132/79   07/09/18 131/83   05/30/18 119/81   Lumbar disc disease with chronic pain--on tramadol 50mg  q8 prn, gabapentin 100mg  tid; had taken vicodin, percocet prior; sees pain management.    OA--bilat knees, hips, back (LLE sciatica)  Smoker--wrote for chantix.   GERD--Trying to pay attention to diet and what triggers her.  If not improving, will pursue abd ct.  Declines med  Allergic rhinitis--on antihistamine prn; no side effects.  Good control   Chronic insomnia--on trazodone 50mg  only prn; tried ambien (headache).  Works well.   Osteopenia--bmd 12-21-2022 Tmin -2.2, frax not done  Eczema--on betamethasone cream prn  Carpal tunnel--bilat  H/o L knee replacement  Obesity--Body mass index is 34.05 kg/m. Discussed working on diet/exercise.  Wt Readings from Last 3 Encounters:   08/29/18 81.7 kg (180 lb 3.2 oz)   07/09/18 82.1 kg (181 lb)   05/30/18 81.6 kg (180 lb)     SOCIAL HISTORY: Works with mentally handicapped.  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  Taking care of elderly dad  Smoker (1/4ppd x 30+yrs), no etoh    HEALTH MAINTENANCE:  Shingrix: discussed  Prevnar:  Pneumovax:  Hep C: No results found for: HCVAB  EKG: 6/18  ASCVD Risk (08/29/2018):  CT chest: 12/05/17 nl  ASA:  BMD:   Ca/VitD:    Immunization History   Administered Date(s) Administered   . Influenza (Im) Preserved TRIVALENT VACCINE 06/30/2014   .  Influenza Vacc QUAD Recombinant PF 17yrs & up 07/09/2018   . Influenza quadrivalent (IM) 3 Yrs & greater 07/15/2015   . Influenza quadrivalent (IM) PF 3 Yrs & greater 07/31/2016, 07/23/2017   . Tdap 12/17/2008, 06/30/2014      Health Maintenance Due   Topic Date Due   . [facility] CARE PLAN LETTER  Mar 16, 1956   . HEPATITIS C SCREENING  02/12/2007   . Shingrix Vaccine 50+ (1) 02/12/2007   . Annual Exam  07/23/2018     Allergies   Allergen Reactions   . Latex Rash     Lab Results   Component Value Date    WBC 5.93 11/27/2017    HGB 14.3 11/27/2017    HCT 46.4 (H) 11/27/2017    PLT 293 11/27/2017    CHOL 198 11/27/2017    TRIG 110 11/27/2017    HDL 47 11/27/2017    LDL 129 (H) 11/27/2017    ALT 37 11/27/2017    AST 33 11/27/2017    NA 141 11/27/2017    K 3.8 11/27/2017    CL 105 11/27/2017    CREAT 0.8 11/27/2017    BUN 11.0 11/27/2017    CO2 27 11/27/2017    TSH 0.60 11/27/2017    INR 1.0 02/22/2017    GLU 90 11/27/2017  HGBA1C 5.5 01/29/2017     No results found for: VITD, 25HYDROXYDTO    Review of Systems   Constitutional: Negative for fever and unexpected weight change.   HENT: Negative for congestion and postnasal drip.    Respiratory: Negative for cough, shortness of breath and wheezing.    Cardiovascular: Negative for chest pain and leg swelling.   Gastrointestinal: Negative for abdominal pain, constipation, diarrhea, nausea and vomiting.   Genitourinary: Negative for dysuria and frequency.   Psychiatric/Behavioral: Negative for dysphoric mood and sleep disturbance. The patient is not nervous/anxious.      Physical Exam   Constitutional: She is oriented to person, place, and time. No distress.   Eyes: Conjunctivae are normal.   Neck: Normal range of motion. No thyromegaly present.   Cardiovascular: Normal rate, regular rhythm, normal heart sounds and intact distal pulses. Exam reveals no gallop and no friction rub.   No murmur heard.  Pulmonary/Chest: Effort normal and breath sounds normal. She has no wheezes. She  has no rales.   Abdominal: Soft. Bowel sounds are normal. She exhibits no distension and no mass. There is no tenderness.   Musculoskeletal:         General: No edema.   Lymphadenopathy:     She has no cervical adenopathy.   Neurological: She is alert and oriented to person, place, and time.   Skin: No rash noted.   Psychiatric: She has a normal mood and affect. Her behavior is normal.     HIgh BMI Follow Up   BMI Follow Up Care Plan Documented    ASSESSMENT/PLAN:  (See above for details)  [name] was seen today for hypertension.    Diagnoses and all orders for this visit:    Anxiety    Essential hypertension    Smoker    Chronic insomnia        Continue current meds for stable conditions  Symptomatic treatment reviewed in detail    Risk & Benefits of any new medication(s) were explained to the patient who verbalized understanding & agreed to the treatment plan.     Call if symptoms persist, worsen, or change.  Call with updates/questions/concerns.

## 2018-08-29 ENCOUNTER — Encounter (INDEPENDENT_AMBULATORY_CARE_PROVIDER_SITE_OTHER): Payer: Self-pay | Admitting: Internal Medicine

## 2018-08-29 ENCOUNTER — Ambulatory Visit (INDEPENDENT_AMBULATORY_CARE_PROVIDER_SITE_OTHER): Payer: No Typology Code available for payment source | Admitting: Internal Medicine

## 2018-08-29 VITALS — BP 132/79 | HR 106 | Temp 98.1°F | Wt 180.2 lb

## 2018-08-29 DIAGNOSIS — I10 Essential (primary) hypertension: Secondary | ICD-10-CM

## 2018-08-29 DIAGNOSIS — F419 Anxiety disorder, unspecified: Secondary | ICD-10-CM

## 2018-08-29 DIAGNOSIS — F5104 Psychophysiologic insomnia: Secondary | ICD-10-CM

## 2018-08-29 DIAGNOSIS — F17200 Nicotine dependence, unspecified, uncomplicated: Secondary | ICD-10-CM

## 2018-09-04 ENCOUNTER — Encounter (INDEPENDENT_AMBULATORY_CARE_PROVIDER_SITE_OTHER): Payer: Self-pay

## 2018-09-05 ENCOUNTER — Ambulatory Visit (INDEPENDENT_AMBULATORY_CARE_PROVIDER_SITE_OTHER): Payer: Self-pay

## 2018-09-05 ENCOUNTER — Ambulatory Visit (INDEPENDENT_AMBULATORY_CARE_PROVIDER_SITE_OTHER): Payer: No Typology Code available for payment source

## 2018-09-05 DIAGNOSIS — F419 Anxiety disorder, unspecified: Secondary | ICD-10-CM
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
## 2018-09-14 ENCOUNTER — Emergency Department: Payer: No Typology Code available for payment source

## 2018-09-14 ENCOUNTER — Emergency Department
Admission: EM | Admit: 2018-09-14 | Discharge: 2018-09-14 | Disposition: A | Discharge status: Home / Self Care | Payer: No Typology Code available for payment source | Attending: Emergency Medicine | Admitting: Emergency Medicine

## 2018-09-14 DIAGNOSIS — J029 Acute pharyngitis, unspecified: Secondary | ICD-10-CM | POA: Insufficient documentation

## 2018-09-14 DIAGNOSIS — F17210 Nicotine dependence, cigarettes, uncomplicated: Secondary | ICD-10-CM | POA: Insufficient documentation

## 2018-09-14 DIAGNOSIS — B349 Viral infection, unspecified: Secondary | ICD-10-CM | POA: Insufficient documentation

## 2018-09-14 DIAGNOSIS — R05 Cough: Secondary | ICD-10-CM | POA: Insufficient documentation

## 2018-09-14 DIAGNOSIS — R059 Cough, unspecified: Secondary | ICD-10-CM

## 2018-09-14 DIAGNOSIS — R0789 Other chest pain: Secondary | ICD-10-CM | POA: Insufficient documentation

## 2018-09-14 DIAGNOSIS — I10 Essential (primary) hypertension: Secondary | ICD-10-CM | POA: Insufficient documentation

## 2018-09-14 MED ORDER — LIDOCAINE 5 % EX PTCH
1.0000 | MEDICATED_PATCH | CUTANEOUS | Status: DC
  Administered 2018-09-14: 15:00:00 1 via TRANSDERMAL
  Filled 2018-09-14: qty 1

## 2018-09-14 MED ORDER — LIDOCAINE 5 % EX PTCH
1.00 | MEDICATED_PATCH | CUTANEOUS | 0 refills | Status: DC
Start: 2018-09-14 — End: 2018-11-27

## 2018-09-14 NOTE — Discharge Instructions (Signed)
Dear [name]:    Thank you for choosing one of [facility] emergency departments.  I hope your visit today was EXCELLENT.    Specific instructions for your visit today:    I am sorry you aren't feeling well. Thankfully your oxygen levels here today are 100% and your chest x-ray did not show pneumonia. If the lidocaine patch helps you can use that for your chest pain. Also take acetaminophen (Tylenol) and ibuprofen for pain/fever.  There are many cough medicines available but many do not really work - the cough is helping your lungs clean and is helping to clear the virus.     Cough    You have been seen for your cough.    There are many possible causes of cough. Most are not dangerous. Your doctor has determined that it is OK for you to go home today.    Antibiotics are not necessary for your cough at this time. If your cough was caused by a virus, asthma, or lung irritation, then antibiotics will not help.    The doctor may have prescribed some medicine to help with your cough. Use the medicine as directed.    YOU SHOULD SEEK MEDICAL ATTENTION IMMEDIATELY, EITHER HERE OR AT THE NEAREST EMERGENCY DEPARTMENT, IF ANY OF THE FOLLOWING OCCURS:   You wheeze or have trouble breathing.   You cough up mucous or lose weight for no reason.   You have a fever (temperature higher than 100.65F / 38C) that lasts more than 5 days.   You have chest pain.   Your symptoms get worse or do not get better in 2 or 3 days.   You have any new problems or concerns.               Pharyngitis, Viral    You have been diagnosed with viral pharyngitis. This is the medical term for a sore throat.    Viral pharyngitis is an infection in the back of your throat and tonsils caused by a virus. It may look and feel like a Strep throat, but it usually occurs with other cold symptoms like a runny nose, sinus congestion, cough, fever (temperature higher than 100.65F / 38C), or muscle aches. It is sometimes hard  to tell a viral sore throat from Strep throat. Your doctor may do a "rapid strep test" or culture to see which kind of infection you have.    Symptoms include fever, sore throat, painful swallowing, and headache. You may also have symptoms of the common cold and notice swollen tender neck glands. You might have pus or white spots on your tonsils.    Viral pharyngitis is treated with medication for pain and fever. Antibiotics DO NOT cure viral infections and may cause side-effects, like diarrhea, nausea, abdominal cramps or allergic reactions. Using antibiotics when they are not necessary can lead to resistance, meaning antibiotics will not work when you need them in the future.    YOU SHOULD SEEK MEDICAL ATTENTION IMMEDIATELY, EITHER HERE OR AT THE NEAREST EMERGENCY DEPARTMENT, IF ANY OF THE FOLLOWING OCCURS:   You have trouble breathing.   Your voice changes or sounds hoarse.   Your throat pain gets worse or you have neck pain.   You cannot swallow liquids or medication.   You drool or cannot swallow saliva.   You feel worse or don't improve after 2 to 3 days.               Hand  Washing (Edu)    Hand washing is important for removing germs from your hands to prevent the spread of infection.    Illnesses like the common cold and the flu can be spread by passing germs from one person's hands to another. Germs get onto the hands by coughing, sneezing, or touching your face, mouth, or nose. When you are sick, you should wash your hands often to prevent spreading the infection to others. You should also keep your hands clean to help prevent yourself from getting sick.     Eating food that is contaminated with germs can also make you sick. You can prevent this by washing your hands properly. Do this before and after preparing food, or after using the bathroom.    How to wash your hands:   Apply soap to yours hands with running water.   Rub your hands together to make lather (foam).    Be sure to wash both  sides, as well as between the fingers, and under your nails.   Lather for 20 seconds.    Completely rinse the soap from your hands and dry with a clean towel.     If you do not have soap and water, you can also use an alcohol-based hand sanitizer. However, do not use hand sanitizer if you see that your hands are dirty.     When to wash your hands:   Each time you cough or sneeze, or touch your mouth or nose.   After using the bathroom.   After changing diapers or helping a child use the toilet.   Before preparing or touching food.   After touching raw meat or fish.   After touching animals or their waste.   Whenever you see your hands are dirty.              If you do not continue to improve or your condition worsens, please contact your doctor or return immediately to the Emergency Department.    Sincerely,  Arville Care Evern Bio, MD  Attending Emergency Physician  [facility]    OBTAINING A PRIMARY CARE APPOINTMENT    Primary care physicians (PCPs, also known as primary care doctors) are either internists or family medicine doctors. Both types of PCPs focus on health promotion, disease prevention, patient education and counseling, and treatment of acute and chronic medical conditions.    Call for an appointment with a primary care doctor.  Ask to see who is taking new patients.     [facility]  telephone:  [phone]  [email or web address]    For a pediatrician, call the [facility] referral line below.  You can also call to make an appointment at [facility] (except [employer] and [facility]):    [address]  [place], [place] [address]  [phone]    [name]  Call [phone] (available 24 hours a day, 7 days a week) if you need any further referrals and we can help you find a primary care doctor or specialist.  Also, available online at:  [email or web address]    For more information regarding our services at  [facility], please call the number above or visit the website [email or web address]    YOUR CONTACT INFORMATION  Before leaving please check with registration to make sure we have an up-to-date contact number.  You can call registration at [phone], Option 7 [facility] location) or [phone], Option 1 [facility] location) to update your information.  For questions about your hospital bill, please call [phone].  For questions about your Emergency Dept Physician bill please call [phone].      FREE HEALTH SERVICES  If you need help with health or social services, please call 2-1-1 for a free referral to resources in your area.  2-1-1 is a free service connecting people with information on health insurance, free clinics, pregnancy, mental health, dental care, food assistance, housing, and substance abuse counseling.  Also, available online at:  http://www.211[place].org    MEDICAL RECORDS AND TESTS  Certain laboratory test results do not come back the same day, for example urine cultures.   We will contact you if other important findings are noted.  Radiology films are often reviewed again to ensure accuracy.  If there is any discrepancy, we will notify you.      Please call [phone] [facility] location) or [phone] ([place] location) to pick up a complimentary CD of any radiology studies performed.  If you or your doctor would like to request a copy of your medical records, please call [phone].      ORTHOPEDIC INJURY   Please know that significant injuries can exist even when an initial x-ray is read as normal or negative.  This can occur because some fractures (broken bones) are not initially visible on x-rays.  For this reason, close outpatient follow-up with your primary care doctor or bone specialist (orthopedist) is required.    MEDICATIONS AND FOLLOWUP  Please be aware that some prescription medications can cause drowsiness.  Use caution when  driving or operating machinery.    The examination and treatment you have received in our Emergency Department is provided on an emergency basis, and is not intended to be a substitute for your primary care physician.  It is important that your doctor checks you again and that you report any new or remaining problems at that time.      24 HOUR PHARMACIES  Two nearby 24 hour pharmacies are:    [facility]  [address]  [place], [place] [address]  [phone]    [facility]  [address]  [place], [place] [address]  [phone]      ASSISTANCE WITH INSURANCE    Affordable Care Act  [facility])  Call to start or finish an application, compare plans, enroll or ask a question.  [phone]  TTY: [phone]  Web:  Healthcare.gov    Help Enrolling in [facility]  [employer] [place]  212 088 2036 (TOLL-FREE)  [phone] (TTY)  Web:  Http://www.coverva.org    Local Help Enrolling in the [facility]  [place] [facility]  [phone] (MAIN)  Email:  health-help@nvfs .org  Web:  BlackjackMyths.is  Address:  [address], [address] [place], [place] [address]    SEDATING MEDICATIONS  Sedating medications include strong pain medications (e.g. narcotics), muscle relaxers, benzodiazepines (used for anxiety and as muscle relaxers), Benadryl/diphenhydramine and other antihistamines for allergic reactions/itching, and other medications.  If you are unsure if you have received a sedating medication, please ask your physician or nurse.  If you received a sedating medication: DO NOT drive a car. DO NOT operate machinery. DO NOT perform jobs where you need to be alert.  DO NOT drink alcoholic beverages while taking this medicine.     If you get dizzy, sit or lie down at the first signs. Be careful going up and down stairs.  Be extra careful to prevent falls.     Never give this medicine to  others.     Keep this medicine out of reach of children.     Do not take or save old medicines. Throw them away when  outdated.     Keep all medicines in a cool, dry place. DO NOT keep them in your bathroom medicine cabinet or in a cabinet above the stove.    MEDICATION REFILLS  Please be aware that we cannot refill any prescriptions through the ER. If you need further treatment from what is provided at your ER visit, please follow up with your primary care doctor or your pain management specialist.

## 2018-09-14 NOTE — ED Provider Notes (Signed)
[facility]      Visit date: 09/14/2018      CLINICAL SUMMARY          Diagnosis:    .     Final diagnoses:   Right-sided chest wall pain   Cough in adult   Pharyngitis with viral syndrome         MDM Notes:    Cough - consistent with viral syndrome.  As smoker and unable to clearly auscultate lungs will check for PNA.    Discussed mechanism of cough and why it is helpful and can last for a long time, discussed OTC meds.           Disposition:         Discharge           I was present for the bedside discharge alongside the patient's ED nurse. Course of visit in the ED and discharge instructions were reviewed with patient and they were given the opportunity to ask any questions regarding their care today. Patient and/ or patient's family verbalized understanding of, and comfort with, instructions and plan.          Discharge Prescriptions     Medication Sig Dispense Auth. Provider    lidocaine (LIDODERM) 5 % Place 1 patch onto the skin every 24 hours Remove & Discard patch within 12 hours or as directed by MD 15 each Arville Care, Evern Bio, MD                         CLINICAL INFORMATION        HPI:      Chief Complaint: Cough and Abdominal Pain  .    [name] is a 62 y.o. female who presents with cough, congestion and right sided chest pain, now with epigastric pain when she coughs. Has not been able to smoke for past 3 days due to the cough and pain. No n/v. Usually smokes 1/4 PPD. Taken Dayquil without much improvement.    History obtained from: Patient          ROS:      Positive and negative ROS elements as per HPI.  The balance of a ten point review of systems is otherwise negative.         Physical Exam:      Pulse 95  BP 143/85  Resp 18  SpO2 100 %  Temp 99 F (37.2 C)    Physical Exam  Vitals signs reviewed.   Constitutional:       General: She is not in acute distress.     Appearance: She is well-developed. She is not toxic-appearing.   HENT:       Head: Normocephalic.      Right Ear: Tympanic membrane and ear canal normal.      Left Ear: Tympanic membrane and ear canal normal.      Nose: Congestion present. No rhinorrhea.      Mouth/Throat:      Mouth: Mucous membranes are moist.      Pharynx: Posterior oropharyngeal erythema present. No oropharyngeal exudate.      Tonsils: No tonsillar exudate or tonsillar abscesses. Swelling: 0 on the right. 0 on the left.   Eyes:      General: No scleral icterus.     Conjunctiva/sclera: Conjunctivae normal.   Neck:      Musculoskeletal: Normal range of motion and neck supple.   Cardiovascular:  Rate and Rhythm: Normal rate and regular rhythm.      Heart sounds: Normal heart sounds. No murmur.   Pulmonary:      Effort: Pulmonary effort is normal. No respiratory distress.      Breath sounds: Decreased air movement present. No stridor. Examination of the right-middle field reveals decreased breath sounds. Examination of the right-lower field reveals decreased breath sounds. Examination of the left-lower field reveals decreased breath sounds. Decreased breath sounds present. No wheezing or rhonchi.      Comments: Coughing making BS hard to auscultate  Chest:      Chest wall: Tenderness present.       Abdominal:      General: Bowel sounds are normal. There is no distension.      Palpations: Abdomen is soft.      Tenderness: There is abdominal tenderness in the epigastric area. There is no guarding or rebound.   Skin:     General: Skin is warm and dry.      Capillary Refill: Capillary refill takes less than 2 seconds.      Findings: No erythema.   Neurological:      Mental Status: She is alert.   Psychiatric:         Behavior: Behavior normal.                   PAST HISTORY        Primary Care Provider: [name], MD        PMH/PSH:    .     Past Medical History:   Diagnosis Date   . Abnormal vision     glasses   . Anxiety     uses xanax @ bedtime   . Arthritis     bilateral knees/OA hips/back   . Carpal tunnel  syndrome on both sides     stiffness noted    . Difficulty walking     limps   . Eczema     has prescription cream   . Fracture     finger   . Gastroesophageal reflux disease     heartburn recently diet related/some problem swallowing   . History of gonorrhea    . Hypertensive disorder     stable meds   . Lower back pain     sciatica radiates down LLE: Pain range= 0-10   . Neck pain     related to lower back but significant at this time    . Neuropathy     numbness upon awakening from low back radiating down LLE .Marland Kitchen. diminishes after awhile after moving around   . Pneumonia     bronchitis in past   . 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        She has a past surgical history that includes Ganglion cyst excision (Right, 2007); Knee arthroscopy (Right, 09/24/2014); Knee arthroscopy (Left, 03/2015); Colonoscopy (2016); Induced abortion; Vaginal delivery; ARTHROPLASTY, KNEE, TOTAL (Left, 12/14/2015); Tonsillectomy; REPAIR, UPPER EXTEMITY TENDON (Right, 03/02/2017); and ENDOSCOPIC CARPAL TUNNEL RELEASE (Right, 03/02/2017).      Social/Family History:      She reports that she has been smoking cigarettes. She has a 7.50 pack-year smoking history. She has never used smokeless tobacco. She reports that she does not drink alcohol or use drugs.    Family History   Problem Relation Age of Onset   . Hypertension Mother    . Stroke Mother    . Hypertension  Father    . Kidney disease Brother    . Heart disease Maternal Grandmother          Listed Medications on Arrival:    .     Home Medications     Med List Status:  Complete Set By: Rockey Situ, RN at 09/14/2018  2:22 PM                DENTA 5000 PLUS 1.1 % Cream     BRUSH FOR 2 MINS TWICE DAILY.EXPECTORATE, DO NOT RINSE. NO EAT/DRINK X30MINS     losartan-hydrochlorothiazide (HYZAAR) 100-12.5 MG per tablet     TAKE 1 TABLET BY MOUTH DAILY     traZODone (DESYREL) 50 MG tablet     Take 1 tablet (50 mg total) by mouth nightly         Allergies: She is allergic to latex.             VISIT INFORMATION        Clinical Course in the ED:                 Medications Given in the ED:    .     ED Medication Orders (From admission, onward)    Start Ordered     Status Ordering Provider    09/14/18 1436 09/14/18 1435  lidocaine (LIDODERM) 5 % 1 patch  Every 24 hours     Route: Transdermal  Ordered Dose: 1 patch     Last MAR action:  Patch Applied Ramie Erman J            Procedures:      Procedures      Interpretations:       CXR - interpreted by me - normal cardiac outline, no acute infiltrate, no effusion,                RESULTS        Recent Lab Results:      Results     ** No results found for the last 24 hours. **              Radiology Results:      XR Chest 2 Views   Final Result      No active cardiopulmonary disease.      Wilmon Pali, MD    09/14/2018 3:08 PM                  Scribe Attestation:      No scribe involved in the care of this patient                          Earlean Shawl, MD  09/14/18 [phone]

## 2018-09-16 ENCOUNTER — Ambulatory Visit (INDEPENDENT_AMBULATORY_CARE_PROVIDER_SITE_OTHER): Payer: No Typology Code available for payment source

## 2018-09-16 ENCOUNTER — Telehealth (INDEPENDENT_AMBULATORY_CARE_PROVIDER_SITE_OTHER): Payer: Self-pay

## 2018-09-16 NOTE — Telephone Encounter (Signed)
Received call from pt on 09/16/18 at 9:11 AM. Pt reported unable to come to appointment due to work conflict. Needs to reschedule.

## 2018-09-23 ENCOUNTER — Encounter (INDEPENDENT_AMBULATORY_CARE_PROVIDER_SITE_OTHER): Payer: Self-pay

## 2018-09-24 ENCOUNTER — Telehealth (INDEPENDENT_AMBULATORY_CARE_PROVIDER_SITE_OTHER): Payer: Self-pay

## 2018-09-24 ENCOUNTER — Ambulatory Visit (INDEPENDENT_AMBULATORY_CARE_PROVIDER_SITE_OTHER): Payer: No Typology Code available for payment source

## 2018-09-24 NOTE — Telephone Encounter (Signed)
Called and LVM with pt re: rescheduling 09/24/2018 appt due to inclement weather. Provided desk number and requested call back.

## 2018-09-25 ENCOUNTER — Encounter (INDEPENDENT_AMBULATORY_CARE_PROVIDER_SITE_OTHER): Payer: Self-pay

## 2018-09-26 ENCOUNTER — Other Ambulatory Visit (INDEPENDENT_AMBULATORY_CARE_PROVIDER_SITE_OTHER): Payer: Self-pay

## 2018-09-26 ENCOUNTER — Encounter (INDEPENDENT_AMBULATORY_CARE_PROVIDER_SITE_OTHER): Payer: Self-pay

## 2018-09-26 NOTE — Progress Notes (Signed)
09/26/18 1552   SPN Nurse Navigator Follow Up Report Items   Follow Up For: Care Plan Update

## 2018-09-26 NOTE — Progress Notes (Signed)
09/26/18 1552   Pt Outreach/Care Plan Metrics   [employer]ing for [employer] Status for Metrics listing Left Voicemail 1

## 2018-09-26 NOTE — Progress Notes (Signed)
Nurse Navigator placed telephone call to patient to follow up, update care plan and discuss possible care coordination needs with patient.  No answer at contact telephone number, left voicemail message to call back.          Shareece Bultman, BSN, RN-BC  Nurse Navigator  [facility]  T [phone] M [phone]

## 2018-09-26 NOTE — Progress Notes (Signed)
09/26/18 1552   Patient Interventions This Encounter   Telephone Interventions 1   MyChart Interventions 0   In-Person Intervention 0   Other Intervention 0   Intervention Tally this encounter 1

## 2018-10-10 ENCOUNTER — Encounter (INDEPENDENT_AMBULATORY_CARE_PROVIDER_SITE_OTHER): Payer: Self-pay

## 2018-10-14 ENCOUNTER — Other Ambulatory Visit (INDEPENDENT_AMBULATORY_CARE_PROVIDER_SITE_OTHER): Payer: Self-pay

## 2018-10-14 NOTE — Progress Notes (Signed)
10/14/18 1358   Care Plan/ Care Coordination   Enrolled in Care Mgmt  Unenrolled   Patient Enrolled in Care Management by: [facility] Navigators

## 2018-10-14 NOTE — Progress Notes (Signed)
Call placed to patient to follow up and update care plan. Patient states that she is doing well. Reports that the therapist visits are going well and she like the therapist. She states that she missed the last visit due to inclement weather, however she has an appointment scheduled for 10/16/2018. Denies having any care coordination needs. Will graduate to maintenance. Verified that she has my contact information and encouraged her to call if I can be of assistance to her.          Karolee Stamps, BSN, RN-BC  [facility]  T [phone] M [phone]

## 2018-10-14 NOTE — Progress Notes (Signed)
10/14/18 1359   Pt Outreach/Care Plan Metrics   Payor Grouping for [employer]   Care Plan Progress Graduated to Maintenance   Outreach Status for [employer] Spoke with Member/Care [employer]

## 2018-10-14 NOTE — Progress Notes (Signed)
10/14/18 1359   SPN Nurse Navigator Follow Up Report Items   Follow Up For: Care Plan Update        10/14/18 1359   SPN Nurse Navigator Follow Up Report Items   Follow Up For: Care Plan Update

## 2018-10-16 ENCOUNTER — Ambulatory Visit (INDEPENDENT_AMBULATORY_CARE_PROVIDER_SITE_OTHER): Payer: No Typology Code available for payment source

## 2018-10-16 ENCOUNTER — Telehealth (INDEPENDENT_AMBULATORY_CARE_PROVIDER_SITE_OTHER): Payer: Self-pay

## 2018-10-16 NOTE — Telephone Encounter (Signed)
Integrated Director left a voicemail for the patient regarding an issue with the cancellation of her appointment on 10/16/2018.

## 2018-10-17 ENCOUNTER — Encounter (INDEPENDENT_AMBULATORY_CARE_PROVIDER_SITE_OTHER): Payer: Self-pay

## 2018-11-19 ENCOUNTER — Encounter (INDEPENDENT_AMBULATORY_CARE_PROVIDER_SITE_OTHER): Payer: Self-pay

## 2018-11-24 ENCOUNTER — Encounter (INDEPENDENT_AMBULATORY_CARE_PROVIDER_SITE_OTHER): Payer: Self-pay

## 2018-11-25 ENCOUNTER — Encounter (INDEPENDENT_AMBULATORY_CARE_PROVIDER_SITE_OTHER): Payer: Self-pay

## 2018-11-27 ENCOUNTER — Emergency Department: Payer: No Typology Code available for payment source

## 2018-11-27 ENCOUNTER — Emergency Department
Admission: EM | Admit: 2018-11-27 | Discharge: 2018-11-27 | Disposition: A | Discharge status: Home / Self Care | Payer: No Typology Code available for payment source | Attending: Emergency Medicine | Admitting: Emergency Medicine

## 2018-11-27 DIAGNOSIS — I10 Essential (primary) hypertension: Secondary | ICD-10-CM | POA: Insufficient documentation

## 2018-11-27 DIAGNOSIS — R000 Tachycardia, unspecified: Secondary | ICD-10-CM

## 2018-11-27 DIAGNOSIS — F17210 Nicotine dependence, cigarettes, uncomplicated: Secondary | ICD-10-CM | POA: Insufficient documentation

## 2018-11-27 DIAGNOSIS — R002 Palpitations: Secondary | ICD-10-CM | POA: Insufficient documentation

## 2018-11-27 DIAGNOSIS — R110 Nausea: Secondary | ICD-10-CM | POA: Insufficient documentation

## 2018-11-27 LAB — CBC AND DIFFERENTIAL
Absolute NRBC: 0 10*3/uL (ref 0.00–0.00)
Basophils Absolute Automated: 0.05 10*3/uL (ref 0.00–0.08)
Basophils Automated: 0.8 %
Eosinophils Absolute Automated: 0.1 10*3/uL (ref 0.00–0.44)
Eosinophils Automated: 1.6 %
Hematocrit: 42.4 % (ref 34.7–43.7)
Hgb: 13.5 g/dL (ref 11.4–14.8)
Immature Granulocytes Absolute: 0.01 10*3/uL (ref 0.00–0.07)
Immature Granulocytes: 0.2 %
Lymphocytes Absolute Automated: 2.58 10*3/uL (ref 0.42–3.22)
Lymphocytes Automated: 41.1 %
MCH: 29.5 pg (ref 25.1–33.5)
MCHC: 31.8 g/dL (ref 31.5–35.8)
MCV: 92.8 fL (ref 78.0–96.0)
MPV: 10.8 fL (ref 8.9–12.5)
Monocytes Absolute Automated: 0.59 10*3/uL (ref 0.21–0.85)
Monocytes: 9.4 %
Neutrophils Absolute: 2.95 10*3/uL (ref 1.10–6.33)
Neutrophils: 46.9 %
Nucleated RBC: 0 /100 WBC (ref 0.0–0.0)
Platelets: 278 10*3/uL (ref 142–346)
RBC: 4.57 10*6/uL (ref 3.90–5.10)
RDW: 13 % (ref 11–15)
WBC: 6.28 10*3/uL (ref 3.10–9.50)

## 2018-11-27 LAB — ECG 12-LEAD
Atrial Rate: 101 {beats}/min
P Axis: 76 degrees
P-R Interval: 154 ms
Q-T Interval: 356 ms
QRS Duration: 78 ms
QTC Calculation (Bezet): 461 ms
R Axis: 55 degrees
T Axis: 46 degrees
Ventricular Rate: 101 {beats}/min

## 2018-11-27 LAB — COMPREHENSIVE METABOLIC PANEL
ALT: 32 U/L (ref 0–55)
AST (SGOT): 27 U/L (ref 5–34)
Albumin/Globulin Ratio: 1.1 (ref 0.9–2.2)
Albumin: 3.9 g/dL (ref 3.5–5.0)
Alkaline Phosphatase: 88 U/L (ref 37–106)
BUN: 10 mg/dL (ref 7.0–19.0)
Bilirubin, Total: 0.5 mg/dL (ref 0.2–1.2)
CO2: 24 mEq/L (ref 22–29)
Calcium: 9.7 mg/dL (ref 8.5–10.5)
Chloride: 106 mEq/L (ref 100–111)
Creatinine: 0.8 mg/dL (ref 0.6–1.0)
Globulin: 3.5 g/dL (ref 2.0–3.6)
Glucose: 114 mg/dL — ABNORMAL HIGH (ref 70–100)
Potassium: 3.4 mEq/L — ABNORMAL LOW (ref 3.5–5.1)
Protein, Total: 7.4 g/dL (ref 6.0–8.3)
Sodium: 140 mEq/L (ref 136–145)

## 2018-11-27 LAB — URINALYSIS REFLEX TO MICROSCOPIC EXAM - REFLEX TO CULTURE
Bilirubin, UA: NEGATIVE
Glucose, UA: NEGATIVE
Ketones UA: NEGATIVE
Leukocyte Esterase, UA: NEGATIVE
Nitrite, UA: NEGATIVE
Protein, UR: NEGATIVE
Specific Gravity UA: 1.008 (ref 1.001–1.035)
Urine pH: 7 (ref 5.0–8.0)
Urobilinogen, UA: NORMAL mg/dL (ref 0.2–2.0)

## 2018-11-27 LAB — TROPONIN I: Troponin I: 0.02 ng/mL (ref 0.00–0.05)

## 2018-11-27 LAB — IHS D-DIMER: D-Dimer: <0.27 ug/mL FEU (ref 0.00–0.70)

## 2018-11-27 LAB — LIPASE: Lipase: 30 U/L (ref 8–78)

## 2018-11-27 LAB — GFR: EGFR: >60

## 2018-11-27 MED ORDER — KETOROLAC TROMETHAMINE 30 MG/ML IJ SOLN
30.00 mg | Freq: Once | INTRAMUSCULAR | Status: DC
Start: 2018-11-27 — End: 2018-11-27

## 2018-11-27 MED ORDER — ONDANSETRON 4 MG PO TBDP
4.00 mg | ORAL_TABLET | Freq: Four times a day (QID) | ORAL | 0 refills | Status: DC | PRN
Start: 2018-11-27 — End: 2018-12-24

## 2018-11-27 MED ORDER — KETOROLAC TROMETHAMINE 30 MG/ML IJ SOLN
60.00 mg | Freq: Once | INTRAMUSCULAR | Status: AC
Start: 2018-11-27 — End: 2018-11-27
  Administered 2018-11-27: 09:00:00 60 mg via INTRAMUSCULAR
  Filled 2018-11-27: qty 2

## 2018-11-27 MED ORDER — ONDANSETRON HCL 4 MG/2ML IJ SOLN
4.00 mg | Freq: Once | INTRAMUSCULAR | Status: AC
Start: 2018-11-27 — End: 2018-11-27
  Administered 2018-11-27: 08:00:00 4 mg via INTRAVENOUS
  Filled 2018-11-27: qty 2

## 2018-11-27 MED ORDER — SODIUM CHLORIDE 0.9 % IV BOLUS
1000.00 mL | Freq: Once | INTRAVENOUS | Status: AC
Start: 2018-11-27 — End: 2018-11-27
  Administered 2018-11-27: 08:00:00 1000 mL via INTRAVENOUS

## 2018-11-27 NOTE — Discharge Instructions (Signed)
Palpitations  Your labs and chest x-ray were normal. Drink lots of fluids and rest. Take Zofran as needed for nausea. Take Tylenol as needed for headache, muscle pain. Return for shortness of breath, chest pain, any concerning symptoms.     You have been diagnosed with "palpitations."    Palpitations are beats in the chest that feel funny or strange. They are often caused by extra heartbeats that come earlier than normal. These are either "premature atrial contractions" or "premature ventricular contractions," depending on where in the heart they happen. Palpitations often go away on their own and often cause no serious problems. They are sometimes related to stress or lack of sleep. They can also be related too much caffeine. These symptoms can be caused by many over-the-counter (no prescription needed) cold medicines, diet pills and "natural" vitamin supplements with stimulants, often ephedrine (Ephedrine is also known by its traditional [place] name, Ma huang).    Palpitations feel different for different people. Some patients describe a feeling of "butterflies" in the chest. Others say it feels like the heart is "flipping over" in the chest. Palpitations may happen often but should last only a second or two each time. They should not cause any chest pain, lightheadedness, dizziness or fainting.    There is no specific treatment for palpitations. However, you should avoid all caffeine and cold medications. Also avoid all natural stimulants and chocolate.    Follow up with your primary doctor in the next week to make sure your symptoms are getting better. In some cases, a Holter monitor may be ordered. A Holter monitor is a portable heart monitor. It records your heart's electrical rhythm. You will need to see your regular doctor to get the results from the test.    YOU SHOULD SEEK MEDICAL ATTENTION IMMEDIATELY, EITHER HERE OR AT THE NEAREST EMERGENCY DEPARTMENT, IF ANY OF THE FOLLOWING  OCCURS:   Lightheadedness or the feeling you might faint.   An unusually fast or slow heart rate.   Chest pain or shortness of breath.   Palpitations increase when you exercise.   Any other worsening symptoms or concerns.               Nausea    You have been seen for nausea.    Nausea is the feeling that you are going to vomit (throw up). Nausea is not a disease. It is a symptom of another problem. For example, nausea, vomiting and diarrhea are symptoms of a stomach virus (the "stomach flu").    You may or may not vomit when you have nausea.     The nausea itself is not dangerous but it can be very uncomfortable.    We might not be able to find out today what is causing your nausea. It is VERY IMPORTANT to see your doctor who can watch for any serious problems.    There are many treatments for nausea. The medical staff will discuss these with you. Some common medicines used to help with nausea are   Promethazine (Phenergan), prochlorperazine (Compazine), metoclopramide (Reglan), ondansetron (Zofran), and many others.    Follow a clear liquid diet. Drink water, broth, 7-Up, Sprite, or other clear caffeine-free soft drinks or sports drinks until you feel better. This might help the nausea and keep you from vomiting.     If your nausea lasts for longer than a few days, or if you have new symptoms, we STRONGLY RECOMMEND that you go to see your family doctor, specialist or  clinic. If you cannot get an appointment or do not have a doctor you can always return here or go to the nearest emergency department to be seen again.    YOU SHOULD SEEK MEDICAL ATTENTION IMMEDIATELY, EITHER HERE OR AT THE NEAREST EMERGENCY DEPARTMENT, IF ANY OF THE FOLLOWING OCCURS:   You vomit often.   You have abdominal (belly) pain.   You vomit blood or anything that looks like coffee grounds.   You have a headache.   You have any new symptoms or concerns.   You feel more "unwell."

## 2018-11-27 NOTE — ED Provider Notes (Signed)
EMERGENCY DEPARTMENT HISTORY AND PHYSICAL EXAM    Date Time: 11/27/18 7:06 AM  Patient Name: [name]  Attending Physician: Merceda Elks MD.               History of Presenting Illness       Chief Complaint:   Chief Complaint   Patient presents with   . Epigastric pain       [name] is a 63 y.o. female who presents with palpitations and nausea. The patient works in a group home and has been having symptoms for two days. She notes "upset stomach." One of the residents had diarrhea this morning. She denies diarrhea, CP, SOB. Denies cough. She is a smoker and denies recent travel.      This history was obtained from the(a) patient    Past Medical History     Past Medical History:   Diagnosis Date   . Abnormal vision     glasses   . Anxiety     uses xanax @ bedtime   . Arthritis     bilateral knees/OA hips/back   . Carpal tunnel syndrome on both sides     stiffness noted    . Difficulty walking     limps   . Eczema     has prescription cream   . Fracture     finger   . Gastroesophageal reflux disease     heartburn recently diet related/some problem swallowing   . History of gonorrhea    . Hypertensive disorder     stable meds   . Lower back pain     sciatica radiates down LLE: Pain range= 0-10   . Neck pain     related to lower back but significant at this time    . Neuropathy     numbness upon awakening from low back radiating down LLE .Marland Kitchen. diminishes after awhile after moving around   . Pneumonia     bronchitis in past   . 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        Past Surgical History     Past Surgical History:   Procedure Laterality Date   . ARTHROPLASTY, KNEE, TOTAL Left 12/14/2015    Procedure: ARTHROPLASTY, KNEE, TOTAL;  Surgeon: Cynda Familia, MD;  Location: [facility];  Service: Orthopedics;  Laterality: Left;  LEFT TOTAL KNEE ARTHROPLASTY   . COLONOSCOPY  2016    X1 polyp   . ENDOSCOPIC CARPAL TUNNEL RELEASE Right 03/02/2017    Procedure: ENDOSCOPIC CARPAL TUNNEL RELEASE;   Surgeon: Virgilio Frees., MD;  Location: [facility];  Service: Orthopedics;  Laterality: Right;   . GANGLION CYST EXCISION Right 2007   . INDUCED ABORTION     . KNEE ARTHROSCOPY Right 09/24/2014   . KNEE ARTHROSCOPY Left 03/2015   . REPAIR, UPPER EXTREMITY TENDON Right 03/02/2017    Procedure: REPAIR, UPPER EXTREMITY TENDON, EXCISION, GANGLION CYST;  Surgeon: Virgilio Frees., MD;  Location: [facility];  Service: Orthopedics;  Laterality: Right;  right wrist flexor carpi radialis tenosynovectomy w/ possible carpal tunnel release and soft tissue mass excision  q1-unk, asst=n, equip=needle tip bovie, beaver blade, lead hand, micro aire, md req 90 min/ok per JD   . TONSILLECTOMY     . VAGINAL DELIVERY      X2 spinals ( 4natural deliveries)       Family History     Family History  Problem Relation Age of Onset   . Hypertension Mother    . Stroke Mother    . Hypertension Father    . Kidney disease Brother    . Heart disease Maternal Grandmother        Social History     Social History     Socioeconomic History   . Marital status: Single     Spouse name: Not on file   . Number of children: Not on file   . Years of education: Not on file   . Highest education level: Not on file   Occupational History   . Not on file   Social Needs   . Financial resource strain: Not on file   . Food insecurity:     Worry: Not on file     Inability: Not on file   . Transportation needs:     Medical: Not on file     Non-medical: Not on file   Tobacco Use   . Smoking status: Current Every Day Smoker     Packs/day: 0.25     Years: 30.00     Pack years: 7.50     Types: Cigarettes   . Smokeless tobacco: Never Used   . Tobacco comment: has patches   Substance and Sexual Activity   . Alcohol use: No   . Drug use: No   . Sexual activity: Yes     Partners: Male   Lifestyle   . Physical activity:     Days per week: Not on file     Minutes per session: Not on file   . Stress: Not on file   Relationships   . Social  connections:     Talks on phone: Not on file     Gets together: Not on file     Attends religious service: Not on file     Active member of club or organization: Not on file     Attends meetings of clubs or organizations: Not on file     Relationship status: Not on file   . Intimate partner violence:     Fear of current or ex partner: Not on file     Emotionally abused: Not on file     Physically abused: Not on file     Forced sexual activity: Not on file   Other Topics Concern   . Not on file   Social History Narrative   . Not on file       Allergies     Allergies   Allergen Reactions   . Latex Rash       Medications     No current facility-administered medications for this encounter.     Current Outpatient Medications:   .  DENTA 5000 PLUS 1.1 % Cream, BRUSH FOR 2 MINS TWICE DAILY.EXPECTORATE, DO NOT RINSE. NO EAT/DRINK X30MINS, Disp: 57 g, Rfl: 11  .  losartan-hydrochlorothiazide (HYZAAR) 100-12.5 MG per tablet, TAKE 1 TABLET BY MOUTH DAILY, Disp: 90 tablet, Rfl: 3  .  ondansetron (ZOFRAN-ODT) 4 MG disintegrating tablet, Take 1 tablet (4 mg total) by mouth every 6 (six) hours as needed for Nausea, Disp: 8 tablet, Rfl: 0    Review of Systems     Pertinent Positives and Negatives noted in the HPI.  All Other Systems Reviewed and Negative: Yes      Physical Exam     Physical Exam   Constitutional: She appears well-developed and well-nourished.   Vitals stable  Elevated  BMI  NAD  Pulse 98   HENT:   Head: Normocephalic and atraumatic.   Mouth/Throat: Oropharynx is clear and moist.   MMM's  +nasal piercing   Eyes: Conjunctivae are normal.   Neck: Normal range of motion. Neck supple. No JVD present.   Cardiovascular: Regular rhythm, normal heart sounds and intact distal pulses.   Tachycardic   Pulmonary/Chest: Effort normal and breath sounds normal.   Abdominal: Soft. She exhibits no distension. There is no abdominal tenderness. There is no rebound and no guarding.   Soft, +BS, no abdominal tenderness   Musculoskeletal:  Normal range of motion.         General: No edema.      Comments: No c/c/e   Neurological: She is alert. No cranial nerve deficit.   Skin: Skin is warm and dry.   Psychiatric: She has a normal mood and affect.   Nursing note and vitals reviewed.        Diagnostic Study Results     Labs -     Labs Reviewed   COMPREHENSIVE METABOLIC PANEL - Abnormal; Notable for the following components:       Result Value    Glucose 114 (*)     Potassium 3.4 (*)     All other components within normal limits    Narrative:     Replace urinary catheter prior to obtaining the urine culture  if it has been in place for greater than or equal to 14  days:->N/A No Foley  Indications for U/A Reflex to Micro - Reflex to  Culture:->Suprapubic Pain/Tenderness or Dysuria   URINALYSIS REFLEX TO MICROSCOPIC EXAM - REFLEX TO CULTURE - Abnormal; Notable for the following components:    Blood, UA Small (*)     All other components within normal limits    Narrative:     Replace urinary catheter prior to obtaining the urine culture  if it has been in place for greater than or equal to 14  days:->N/A No Foley  Indications for U/A Reflex to Micro - Reflex to  Culture:->Suprapubic Pain/Tenderness or Dysuria   CBC AND DIFFERENTIAL    Narrative:     Replace urinary catheter prior to obtaining the urine culture  if it has been in place for greater than or equal to 14  days:->N/A No Foley  Indications for U/A Reflex to Micro - Reflex to  Culture:->Suprapubic Pain/Tenderness or Dysuria   LIPASE    Narrative:     Replace urinary catheter prior to obtaining the urine culture  if it has been in place for greater than or equal to 14  days:->N/A No Foley  Indications for U/A Reflex to Micro - Reflex to  Culture:->Suprapubic Pain/Tenderness or Dysuria   TROPONIN I    Narrative:     Replace urinary catheter prior to obtaining the urine culture  if it has been in place for greater than or equal to 14  days:->N/A No Foley  Indications for U/A Reflex to Micro - Reflex  to  Culture:->Suprapubic Pain/Tenderness or Dysuria   IHS D-DIMER    Narrative:     Replace urinary catheter prior to obtaining the urine culture  if it has been in place for greater than or equal to 14  days:->N/A No Foley  Indications for U/A Reflex to Micro - Reflex to  Culture:->Suprapubic Pain/Tenderness or Dysuria   GFR    Narrative:     Replace urinary catheter prior to obtaining the urine culture  if it  has been in place for greater than or equal to 14  days:->N/A No Foley  Indications for U/A Reflex to Micro - Reflex to  Culture:->Suprapubic Pain/Tenderness or Dysuria       Radiologic Studies -   XR Chest 2 Views   Final Result    Negative for acute process      Gerlene Burdock, MD    11/27/2018 8:13 AM            Clinical Course in the Emergency Department/Medical Decision Making     I reviewed the vital signs, nursing notes, past medical history, past surgical history, family history and social history.    Vital Signs - BP: (!) 161/93, Temp: 97.1 F (36.2 C), Temp Source: Temporal, Heart Rate: (!) 103, Resp Rate: 16, SpO2: 99 %, Height: 5' (152.4 cm), Weight: 81.6 kg    Pulse Oximetry Analysis - nl without need for supplemental oxygen            Labs:I have reviewed the labs at the time of visit.  Merceda Elks MD.      Differential Diagnosis (not completely inclusive): Gastroenteritis, arrhythmia, pancreatitis, NSTEMI, PE    EKG: Sinus Tach, rate of 101, normal axis, no ST changes, normal conduction    8:32 AM Labs and chest x-ray normal. Pulse now in 90's. IV infiltrated, will give Toradol for HA. Abdomen re-evaluated, soft, no tenderness. Plan to d/c with rx for Zofran and return precautions reviewed.         Final diagnoses:   Palpitations   Nausea     Discharge Medication List as of 11/27/2018  9:26 AM      START taking these medications    Details   ondansetron (ZOFRAN-ODT) 4 MG disintegrating tablet Take 1 tablet (4 mg total) by mouth every 6 (six) hours as needed for Nausea, Starting Wed 11/27/2018,  Normal           ED Disposition     ED Disposition Condition Date/Time Comment    Discharge  Wed Nov 27, 2018  9:26 AM [name] discharge to home/self care.    Condition at disposition: Stable          _______________________________    Attestations:    I was acting as a scribe for Jobe Gibbon, MD on [name]  Treatment Team: Scribe: Delice Lesch   I am the first provider for this patient and I personally performed the services documented. Treatment Team: Scribe: Delice Lesch is scribing for me on Mayon,[name]. This note accurately reflects work and decisions made by me.  Jobe Gibbon, MD  _______________________________               Jobe Gibbon, MD  11/27/18 [phone]

## 2018-12-19 ENCOUNTER — Other Ambulatory Visit (INDEPENDENT_AMBULATORY_CARE_PROVIDER_SITE_OTHER): Payer: Self-pay | Admitting: Internal Medicine

## 2018-12-19 ENCOUNTER — Telehealth (INDEPENDENT_AMBULATORY_CARE_PROVIDER_SITE_OTHER): Payer: Self-pay | Admitting: Internal Medicine

## 2018-12-19 MED ORDER — ALPRAZOLAM 0.5 MG PO TABS
ORAL_TABLET | ORAL | 0 refills | Status: DC
Start: 2018-12-19 — End: 2018-12-24

## 2018-12-19 NOTE — Telephone Encounter (Signed)
Patient has had bad anxiety and would like medication to be made. She said she has been off of it for a bit but now with everything going on she isn't feeling well. She did made a phone appt for 4/7

## 2018-12-19 NOTE — Addendum Note (Signed)
Addended by: [name]. on: 12/19/2018 12:20 PM     Modules accepted: Orders

## 2018-12-19 NOTE — Telephone Encounter (Signed)
Please advise, thank you.

## 2018-12-19 NOTE — Telephone Encounter (Signed)
Sent her 1 refill & we'll talk on 4/7

## 2018-12-20 NOTE — Telephone Encounter (Addendum)
Telemedicine Visit by Phone  [phone]  Verbal consent has been obtained from the patient to conduct a telephone visit encounter to minimize exposure to COVID-19  Time spent in medical discussion: 12    Chief Complaint   Patient presents with   . Anxiety     63 y.o.  female presents for f/u of chronic conditions including worsening anxiety lately.  Has been doing well for months now but more stress lately with covid.    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  Taking meds regularly but hasn't checked bp.    PROBLEM LIST:  Anxiety--tried lexapro 10mg  (palpitations, felt funny); refilled prn xanax 0.5mg  qhs;  Tried melatonin (woke groggy). Tried prozac (2 wks); doing therapy (sister died 12-10-2022).    HTN--on losartan-hctz 100-12.5mg ; compliant with meds; no se's.  Good control at home generally.  Start checking again.  BP Readings from Last 3 Encounters:   11/27/18 155/90   09/14/18 131/78   08/29/18 132/79   Lumbar disc disease with chronic pain--on tramadol 50mg  q8 prn, gabapentin 100mg  tid; had taken vicodin, percocet prior; sees pain management.    OA--bilat knees, hips, back (LLE sciatica)  Smoker--wrote for chantix.   GERD--Trying to pay attention to diet and what triggers her.  If not improving, will pursue abd ct.  Declines med  Allergic rhinitis--on antihistamine prn; no side effects.  Good control   Chronic insomnia--on trazodone 50mg  only prn; tried ambien (headache).  Works well.   Osteopenia--bmd 12/10/2022 Tmin -2.2, frax not done  Eczema--on betamethasone cream prn  Carpal tunnel--bilat  H/o L knee replacement  Obesity--There is no height or weight on file to calculate BMI. Discussed working on diet/exercise.  Wt Readings from Last 3 Encounters:   11/27/18 81.6 kg (180 lb)   09/14/18 81.6 kg (180 lb)   08/29/18 81.7 kg (180 lb 3.2 oz)     SOCIAL HISTORY: Works with mentally handicapped.  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  Smoker  (1/4ppd x 30+yrs), no etoh    HEALTH MAINTENANCE:  Shingrix: discussed  Prevnar:  Pneumovax:  Hep C: No results found for: HCVAB  EKG: 6/18  ASCVD Risk (12/24/2018):  CT chest: 12/05/17 nl  ASA:  BMD:   Ca/VitD:    Immunization History   Administered Date(s) Administered   . Influenza (Im) Preserved TRIVALENT VACCINE 06/30/2014   . Influenza Vacc QUAD Recombinant PF 58yrs & up 07/09/2018   . Influenza quadrivalent (IM) 3 Yrs & greater 07/15/2015   . Influenza quadrivalent (IM) PF 3 Yrs & greater 07/31/2016, 07/23/2017   . Tdap 12/17/2008, 06/30/2014      Health Maintenance Due   Topic Date Due   . [facility] CARE PLAN LETTER  1956/05/10   . Shingrix Vaccine 50+ (1) 1956-04-29   . HEPATITIS C SCREENING  02/12/2007   . Annual Exam  07/23/2018   . DEPRESSION SCREENING  11/28/2018   . MAMMOGRAM  01/23/2019     Allergies   Allergen Reactions   . Latex Rash     Lab Results   Component Value Date    WBC 6.28 11/27/2018    HGB 13.5 11/27/2018    HCT 42.4 11/27/2018    PLT 278 11/27/2018    CHOL 198 11/27/2017    TRIG 110 11/27/2017    HDL 47 11/27/2017    LDL 129 (H) 11/27/2017    ALT 32  11/27/2018    AST 27 11/27/2018    NA 140 11/27/2018    K 3.4 (L) 11/27/2018    CL 106 11/27/2018    CREAT 0.8 11/27/2018    BUN 10.0 11/27/2018    CO2 24 11/27/2018    TSH 0.60 11/27/2017    INR 1.0 02/22/2017    GLU 114 (H) 11/27/2018    HGBA1C 5.5 01/29/2017     No results found for: VITD, 25HYDROXYDTO    HIgh BMI Follow Up   BMI Follow Up Care Plan Documented    ASSESSMENT/PLAN:  (See above for details)  Sintia was seen today for anxiety.    Diagnoses and all orders for this visit:    Anxiety    Essential hypertension    Other orders  -     Discontinue: ALPRAZolam (XANAX) 0.5 MG tablet; TAKE 1 TABLET BY MOUTH NIGHTLY AS NEEDED        Continue current meds for stable conditions  Symptomatic treatment reviewed in detail    Risk & Benefits of any new medication(s) were explained to the patient who verbalized understanding & agreed to the treatment  plan.     Call if symptoms persist, worsen, or change.  Call with updates/questions/concerns.

## 2018-12-24 ENCOUNTER — Encounter (INDEPENDENT_AMBULATORY_CARE_PROVIDER_SITE_OTHER): Payer: Self-pay | Admitting: Internal Medicine

## 2018-12-24 ENCOUNTER — Telehealth (INDEPENDENT_AMBULATORY_CARE_PROVIDER_SITE_OTHER): Payer: No Typology Code available for payment source | Admitting: Internal Medicine

## 2018-12-24 DIAGNOSIS — F419 Anxiety disorder, unspecified: Secondary | ICD-10-CM

## 2018-12-24 DIAGNOSIS — I10 Essential (primary) hypertension: Secondary | ICD-10-CM

## 2018-12-24 MED ORDER — ALPRAZOLAM 0.5 MG PO TABS
ORAL_TABLET | ORAL | 0 refills | Status: DC
Start: 2018-12-24 — End: 2018-12-24

## 2018-12-25 ENCOUNTER — Encounter (INDEPENDENT_AMBULATORY_CARE_PROVIDER_SITE_OTHER): Payer: Self-pay

## 2019-01-02 ENCOUNTER — Encounter (INDEPENDENT_AMBULATORY_CARE_PROVIDER_SITE_OTHER): Payer: No Typology Code available for payment source | Admitting: Internal Medicine

## 2019-01-06 ENCOUNTER — Telehealth (INDEPENDENT_AMBULATORY_CARE_PROVIDER_SITE_OTHER): Payer: No Typology Code available for payment source | Admitting: Internal Medicine

## 2019-01-06 ENCOUNTER — Encounter (INDEPENDENT_AMBULATORY_CARE_PROVIDER_SITE_OTHER): Payer: Self-pay | Admitting: Internal Medicine

## 2019-01-06 ENCOUNTER — Ambulatory Visit (INDEPENDENT_AMBULATORY_CARE_PROVIDER_SITE_OTHER): Payer: Self-pay | Admitting: Internal Medicine

## 2019-01-06 DIAGNOSIS — R5383 Other fatigue: Secondary | ICD-10-CM

## 2019-01-06 NOTE — Telephone Encounter (Signed)
All I can say is if not getting better, let's touch base again by video.

## 2019-01-06 NOTE — Progress Notes (Signed)
Telemedicine Visit by Phone  [phone]  Verbal consent has been obtained from the patient to conduct a telephone visit encounter to minimize exposure to COVID-19  Time spent in medical discussion: 9    Chief Complaint   Patient presents with   . Fatigue     since Thursday, "can't shake it off." and slight headaches, pt don't know if she's been having a fever      63 y.o.  female presents c/o fatigue for last few days since thurs.  Came home from work that day feeling sluggish. 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  Felt like she might be coming down with something.  Slight headache.  Sweats sat night but doesn't know if she had fever.  No congestion, cough, sore throat.  No n/v/d.  Urine normal. Hasn't needed the xanax in 1 week now.    LOV 4/7: f/u of chronic conditions including worsening anxiety lately.  Has been doing well for months now but more stress lately with covid.    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  Taking meds regularly but hasn't checked bp.    PROBLEM LIST:  Fatigue/?Viral illness--rest, fluids.  Let us know if not improving in next few days  Anxiety--tried lexapro 10mg  (palpitations, felt funny); refilled prn xanax 0.5mg  qhs;  Tried melatonin (woke groggy). Tried prozac (2 wks); doing therapy (sister died 2022-12-12).    HTN--on losartan-hctz 100-12.5mg ; compliant with meds; no se's.  Good control at home generally.  Start checking again.  BP Readings from Last 3 Encounters:   11/27/18 155/90   09/14/18 131/78   08/29/18 132/79   Lumbar disc disease with chronic pain--on tramadol 50mg  q8 prn, gabapentin 100mg  tid; had taken vicodin, percocet prior; sees pain management.    OA--bilat knees, hips, back (LLE sciatica)  Smoker--wrote for chantix.   GERD--Trying to pay attention to diet and what triggers her.  If not improving, will pursue abd ct.  Declines med  Allergic rhinitis--on antihistamine prn; no side effects.  Good control    Chronic insomnia--on trazodone 50mg  only prn; tried ambien (headache).  Works well.   Osteopenia--bmd 12/12/22 Tmin -2.2, frax not done  Eczema--on betamethasone cream prn  Carpal tunnel--bilat  H/o L knee replacement  Obesity--There is no height or weight on file to calculate BMI. Discussed working on diet/exercise.  Wt Readings from Last 3 Encounters:   11/27/18 81.6 kg (180 lb)   09/14/18 81.6 kg (180 lb)   08/29/18 81.7 kg (180 lb 3.2 oz)     SOCIAL HISTORY: Works with mentally handicapped.  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  Smoker (1/4ppd x 30+yrs), no etoh    HEALTH MAINTENANCE:  Shingrix: discussed  Prevnar:  Pneumovax:  Hep C: No results found for: HCVAB  EKG: 6/18  ASCVD Risk (01/06/2019):  CT chest: 12/05/17 nl  ASA:  BMD:   Ca/VitD:    Immunization History   Administered Date(s) Administered   . Influenza (Im) Preserved TRIVALENT VACCINE 06/30/2014   . Influenza Vacc QUAD Recombinant PF 53yrs & up 07/09/2018   . Influenza quadrivalent (IM) 3 Yrs & greater 07/15/2015   . Influenza quadrivalent (IM) PF 3 Yrs & greater 07/31/2016, 07/23/2017   . Tdap 12/17/2008, 06/30/2014      Health Maintenance Due   Topic Date Due   . Advance Directive on File  1956-05-28   .  [facility] CARE PLAN LETTER  Feb 15, 1956   . Shingrix Vaccine 50+ (1) July 25, 1956   . HEPATITIS C SCREENING  02/12/2007   . Annual Exam  07/23/2018   . MAMMOGRAM  01/23/2019     Allergies   Allergen Reactions   . Latex Rash     Lab Results   Component Value Date    WBC 6.28 11/27/2018    HGB 13.5 11/27/2018    HCT 42.4 11/27/2018    PLT 278 11/27/2018    CHOL 198 11/27/2017    TRIG 110 11/27/2017    HDL 47 11/27/2017    LDL 129 (H) 11/27/2017    ALT 32 11/27/2018    AST 27 11/27/2018    NA 140 11/27/2018    K 3.4 (L) 11/27/2018    CL 106 11/27/2018    CREAT 0.8 11/27/2018    BUN 10.0 11/27/2018    CO2 24 11/27/2018    TSH 0.60 11/27/2017    INR 1.0 02/22/2017    GLU 114 (H) 11/27/2018    HGBA1C 5.5 01/29/2017     No  results found for: VITD, 25HYDROXYDTO    HIgh BMI Follow Up   BMI Follow Up Care Plan Documented    ASSESSMENT/PLAN:  (See above for details)  [name] was seen today for fatigue.    Diagnoses and all orders for this visit:    Fatigue, unspecified type        Continue current meds for stable conditions  Symptomatic treatment reviewed in detail    Risk & Benefits of any new medication(s) were explained to the patient who verbalized understanding & agreed to the treatment plan.     Call if symptoms persist, worsen, or change.  Call with updates/questions/concerns.

## 2019-01-06 NOTE — Telephone Encounter (Signed)
PtRobin Arnoldo Mueller called [facility] for advice related to their current health.      Pt last evaluated by our office on: 08/29/2018     Covid-19 Screening:     Close contact with a laboratory- confirmed COVID- 19 patient: Denies    Travel history to [place], [place], [place], [place], [place], [place], and [place] within past 14 days: Denies    Travel history inside the continental [place] within past 14 days ([place], [place], [place], [place], [place]): Denies    Travel or cruise ship history in past 14 days: Denies    Fever: Denies - unknown. Thermometer not working    Age: 63 y.o.    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     Pt currently complaining of:    General ROS:   Slight headache  Sweating really bad one night  Denies sore throat  Yesterday was in the restroom a lot with soft BM, but not really diarrhea      Respiratory ROS:   Smoker - dry cough off and on     Pt currently trying these medications:    Vitamin D, E, B12  Tylenol for 2 nights - Friday and Saturday night.     Assessment/ Plan:  Pt states she cannot shake the sluggishness. That is the biggest complaint at this time.     Pt reports that when the weather changes, she is prone to sinus infections. Pt states it could be the pollen in the air.    RN will forward assessment to Dr. Milas Gain for her advise as well. RN advised pt to rest, drink water and informed pt that we will get back to her later today.     RN advised pt to ensure they keep their hands clean by scrubbing for at least 20 seconds, keep all common touch surfaces clean, practice social distancing of at least 6 feet between people if they have to go out and to stay home as much as is  possible. Pt verbalized understanding and will consider.     Catie Sherrol Vicars BA, BSN, Warehouse manager Care Nurse Navigator  [address]  [place], [place] [address]  [phone]

## 2019-01-08 ENCOUNTER — Telehealth (INDEPENDENT_AMBULATORY_CARE_PROVIDER_SITE_OTHER): Payer: Self-pay | Admitting: Internal Medicine

## 2019-01-08 NOTE — Telephone Encounter (Signed)
Pt called requesting a letter to return to work on Monday.     I told pt letter would be uploaded to Mychart.     Thank you

## 2019-01-08 NOTE — Telephone Encounter (Signed)
Please advise 

## 2019-01-09 ENCOUNTER — Encounter (INDEPENDENT_AMBULATORY_CARE_PROVIDER_SITE_OTHER): Payer: Self-pay | Admitting: Internal Medicine

## 2019-01-24 ENCOUNTER — Encounter (INDEPENDENT_AMBULATORY_CARE_PROVIDER_SITE_OTHER): Payer: Self-pay

## 2019-02-17 ENCOUNTER — Other Ambulatory Visit (INDEPENDENT_AMBULATORY_CARE_PROVIDER_SITE_OTHER): Payer: Self-pay | Admitting: Internal Medicine

## 2019-02-17 DIAGNOSIS — I10 Essential (primary) hypertension: Secondary | ICD-10-CM

## 2019-02-24 ENCOUNTER — Encounter (INDEPENDENT_AMBULATORY_CARE_PROVIDER_SITE_OTHER): Payer: Self-pay

## 2019-03-04 ENCOUNTER — Encounter (INDEPENDENT_AMBULATORY_CARE_PROVIDER_SITE_OTHER): Payer: Self-pay

## 2019-03-26 ENCOUNTER — Encounter (INDEPENDENT_AMBULATORY_CARE_PROVIDER_SITE_OTHER): Payer: Self-pay

## 2019-03-27 ENCOUNTER — Encounter (INDEPENDENT_AMBULATORY_CARE_PROVIDER_SITE_OTHER): Payer: Self-pay

## 2019-03-30 NOTE — Progress Notes (Deleted)
No chief complaint on file.    63 y.o.  female presents for Physical    There were no vitals taken for this visit.     PROBLEM LIST:  Anxiety--tried lexapro 10mg  (palpitations, felt funny); refilled prn xanax 0.5mg  qhs;  Tried melatonin (woke groggy). Tried prozac (2 wks); doing therapy (sister died Dec 11, 2022).    HTN--on losartan-hctz 100-12.5mg ; compliant with meds; no se's.  Good control at home generally.  Start checking again.  BP Readings from Last 3 Encounters:   11/27/18 155/90   09/14/18 131/78   08/29/18 132/79   Lumbar disc disease with chronic pain--on tramadol 50mg  q8 prn, gabapentin 100mg  tid; had taken vicodin, percocet prior; sees pain management.    OA--bilat knees, hips, back (LLE sciatica)  Smoker--wrote for chantix.   GERD--Trying to pay attention to diet and what triggers her.  If not improving, will pursue abd ct.  Declines med  Allergic rhinitis--on antihistamine prn; no side effects.  Good control   Chronic insomnia--on trazodone 50mg  only prn; tried ambien (headache).  Works well.   Osteopenia--bmd 12/11/2022 Tmin -2.2, frax not done  Eczema--on betamethasone cream prn  Carpal tunnel--bilat  H/o L knee replacement  Obesity--There is no height or weight on file to calculate BMI. Discussed working on diet/exercise.  Wt Readings from Last 3 Encounters:   11/27/18 81.6 kg (180 lb)   09/14/18 81.6 kg (180 lb)   08/29/18 81.7 kg (180 lb 3.2 oz)     SOCIAL HISTORY: Works with mentally handicapped.  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  Smoker (1/4ppd x 30+yrs), no etoh    HEALTH MAINTENANCE:  Shingrix: discussed  Prevnar:  Pneumovax:  Hep C: No results found for: HCVAB  EKG: 6/18  ASCVD Risk (04/03/2019):  CT chest: 12/05/17 nl  ASA:  BMD:   Ca/VitD:    Immunization History   Administered Date(s) Administered   . Influenza (Im) Preserved TRIVALENT VACCINE 06/30/2014   . Influenza Vacc QUAD Recombinant PF 6yrs & up 07/09/2018   . Influenza quadrivalent (IM) 3 Yrs &  greater 07/15/2015   . Influenza quadrivalent (IM) PF 3 Yrs & greater 07/31/2016, 07/23/2017   . Tdap 12/17/2008, 06/30/2014      Health Maintenance Due   Topic Date Due   . Advance Directive on File  1956/05/02   . [facility] CARE PLAN LETTER  07-23-57   . Shingrix Vaccine 50+ (1) 01/17/1956   . HEPATITIS C SCREENING  02/12/2007   . Annual Exam  07/23/2018   . MAMMOGRAM  01/23/2019   . Prescription Monitoring Program  03/20/2019     Allergies   Allergen Reactions   . Latex Rash     Lab Results   Component Value Date    WBC 6.28 11/27/2018    HGB 13.5 11/27/2018    HCT 42.4 11/27/2018    PLT 278 11/27/2018    CHOL 198 11/27/2017    TRIG 110 11/27/2017    HDL 47 11/27/2017    LDL 129 (H) 11/27/2017    ALT 32 11/27/2018    AST 27 11/27/2018    NA 140 11/27/2018    K 3.4 (L) 11/27/2018    CL 106 11/27/2018    CREAT 0.8 11/27/2018    BUN 10.0 11/27/2018    CO2 24 11/27/2018    TSH 0.60 11/27/2017    INR 1.0 02/22/2017    GLU 114 (H) 11/27/2018    HGBA1C 5.5 01/29/2017  No results found for: VITD, 25HYDROXYDTO    Review of Systems   Constitutional: Negative for activity change, fatigue, fever and unexpected weight change.   HENT: Negative for congestion, dental problem, hearing loss and postnasal drip.    Eyes: Negative for visual disturbance.   Respiratory: Negative for cough, shortness of breath and wheezing.    Cardiovascular: Negative for chest pain, palpitations and leg swelling.   Gastrointestinal: Negative for abdominal pain, constipation, diarrhea and nausea.   Genitourinary: Negative for dysuria, frequency and menstrual problem.   Musculoskeletal: Negative for arthralgias and back pain.   Skin: Negative for rash.   Allergic/Immunologic: Negative for environmental allergies.   Neurological: Negative for headaches.   Hematological: Negative for adenopathy. Does not bruise/bleed easily.   Psychiatric/Behavioral: Negative for dysphoric mood and sleep disturbance.     Physical Exam   Constitutional: She is oriented to  person, place, and time. No distress.   HENT:   Right Ear: Tympanic membrane, external ear and ear canal normal.   Left Ear: Tympanic membrane, external ear and ear canal normal.   Nose: Nose normal.   Mouth/Throat: Oropharynx is clear and moist. Normal dentition. No posterior oropharyngeal erythema.   Eyes: Pupils are equal, round, and reactive to light. Conjunctivae and EOM are normal.   Neck: Normal range of motion. No thyromegaly present.   Cardiovascular: Normal rate, regular rhythm, normal heart sounds and intact distal pulses. Exam reveals no gallop and no friction rub.   No murmur heard.  Pulmonary/Chest: Breath sounds normal. She has no wheezes. She has no rales.   Abdominal: Soft. Bowel sounds are normal. She exhibits no distension and no mass. There is no hepatosplenomegaly. There is no abdominal tenderness.   Musculoskeletal:         General: No edema.   Lymphadenopathy:     She has no cervical adenopathy.   Neurological: She is alert and oriented to person, place, and time. She has normal strength. No cranial nerve deficit. Gait normal.   Skin: Skin is warm and dry. No rash noted. No cyanosis. Nails show no clubbing.   Psychiatric: She has a normal mood and affect. Her behavior is normal.     HIgh BMI Follow Up   BMI Follow Up Care Plan Documented    ASSESSMENT/PLAN:  (See above for details)  Diagnoses and all orders for this visit:    Annual physical exam    Need for hepatitis C screening test        Continue current meds for stable conditions  Symptomatic treatment reviewed in detail    Risk & Benefits of any new medication(s) were explained to the patient who verbalized understanding & agreed to the treatment plan.     Call if symptoms persist, worsen, or change.  Call with updates/questions/concerns.

## 2019-04-03 ENCOUNTER — Encounter (INDEPENDENT_AMBULATORY_CARE_PROVIDER_SITE_OTHER): Payer: No Typology Code available for payment source | Admitting: Internal Medicine

## 2019-04-26 ENCOUNTER — Encounter (INDEPENDENT_AMBULATORY_CARE_PROVIDER_SITE_OTHER): Payer: Self-pay

## 2019-05-27 ENCOUNTER — Encounter (INDEPENDENT_AMBULATORY_CARE_PROVIDER_SITE_OTHER): Payer: Self-pay

## 2019-06-13 NOTE — Progress Notes (Deleted)
Subjective:      Date: 06/16/2019 1:23 PM   Patient ID: [name] is a 63 y.o. female.    Verbal consent has been obtained from the patient to conduct this video visit encounter to minimize exposure to COVID-19. Patient was positively identified and was verified to be located in the state of [place] at the time of the visit     Chief Complaint:  No chief complaint on file.      HPI:  HPI     {NMG Additional Concerns:35282}    Problem List:  Patient Active Problem List   Diagnosis   . Neck pain   . Low back pain   . Essential hypertension   . Primary osteoarthritis of both knees   . Anxiety   . Left knee DJD   . Dizziness   . Lumbar disc disease   . Family history of breast cancer   . Smoker   . Chronic insomnia   . HSV-2       Current Medications:  No outpatient medications have been marked as taking for the 06/16/19 encounter (Appointment) with Aviva Signs, DNP FNP.       Allergies:  Allergies   Allergen Reactions   . Latex Rash       Past Medical History:  Past Medical History:   Diagnosis Date   . Abnormal vision     glasses   . Anxiety     uses xanax @ bedtime   . Arthritis     bilateral knees/OA hips/back   . Carpal tunnel syndrome on both sides     stiffness noted    . Difficulty walking     limps   . Eczema     has prescription cream   . Fracture     finger   . Gastroesophageal reflux disease     heartburn recently diet related/some problem swallowing   . History of gonorrhea    . Hypertensive disorder     stable meds   . Lower back pain     sciatica radiates down LLE: Pain range= 0-10   . Neck pain     related to lower back but significant at this time    . Neuropathy     numbness upon awakening from low back radiating down LLE .Marland Kitchen. diminishes after awhile after moving around   . Pneumonia     bronchitis in past   . 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        Past Surgical History:  Past Surgical History:   Procedure Laterality Date   . ARTHROPLASTY, KNEE, TOTAL Left 12/14/2015     Procedure: ARTHROPLASTY, KNEE, TOTAL;  Surgeon: Cynda Familia, MD;  Location: [facility];  Service: Orthopedics;  Laterality: Left;  LEFT TOTAL KNEE ARTHROPLASTY   . COLONOSCOPY  2016    X1 polyp   . ENDOSCOPIC CARPAL TUNNEL RELEASE Right 03/02/2017    Procedure: ENDOSCOPIC CARPAL TUNNEL RELEASE;  Surgeon: Virgilio Frees., MD;  Location: [facility];  Service: Orthopedics;  Laterality: Right;   . GANGLION CYST EXCISION Right 2007   . INDUCED ABORTION     . KNEE ARTHROSCOPY Right 09/24/2014   . KNEE ARTHROSCOPY Left 03/2015   . REPAIR, UPPER EXTREMITY TENDON Right 03/02/2017    Procedure: REPAIR, UPPER EXTREMITY TENDON, EXCISION, GANGLION CYST;  Surgeon: Virgilio Frees., MD;  Location: [facility];  Service:  Orthopedics;  Laterality: Right;  right wrist flexor carpi radialis tenosynovectomy w/ possible carpal tunnel release and soft tissue mass excision  q1-unk, asst=n, equip=needle tip bovie, beaver blade, lead hand, micro aire, md req 90 min/ok per JD   . TONSILLECTOMY     . VAGINAL DELIVERY      X2 spinals ( 4natural deliveries)       Family History:  Family History   Problem Relation Age of Onset   . Hypertension Mother    . Stroke Mother    . Hypertension Father    . Kidney disease Brother    . Heart disease Maternal Grandmother        Social History:  Social History     Tobacco Use   . Smoking status: Current Every Day Smoker     Packs/day: 0.25     Years: 30.00     Pack years: 7.50     Types: Cigarettes   . Smokeless tobacco: Never Used   . Tobacco comment: has patches   Substance Use Topics   . Alcohol use: No   . Drug use: No         The following sections were reviewed this encounter by the provider:          Review Of Systems  Constitutional:  Denies fever or chills   Eyes:  Denies change in visual acuity   HENT:  Denies nasal congestion or sore throat   Respiratory:  Denies cough or shortness of breath   Cardiovascular:  Denies chest pain or edema   GI:  Denies  abdominal pain, nausea, vomiting, bloody stools or diarrhea   GU:  Denies dysuria   Musculoskeletal:  Denies back pain or joint pain   Integument:  Denies rash   Neurologic:  Denies headache, focal weakness or sensory changes   Endocrine:  Denies polyuria or polydipsia   Lymphatic:  Denies swollen glands   Psychiatric:  Denies acute depression or anxiety     Objective:   Vitals:  There were no vitals taken for this visit.      Physical Exam:  General Examination:   GENERAL APPEARANCE: alert, in no acute distress, speaking clearly and in full sentences      Assessment/Plan:       Problem List Items Addressed This Visit     None            Follow-up:   No follow-ups on file.       Time spent in discussion: {Minutes:46989}     Aviva Signs, DNP FNP

## 2019-06-16 ENCOUNTER — Encounter (INDEPENDENT_AMBULATORY_CARE_PROVIDER_SITE_OTHER): Payer: No Typology Code available for payment source | Admitting: Nurse Practitioner

## 2019-06-16 ENCOUNTER — Encounter (INDEPENDENT_AMBULATORY_CARE_PROVIDER_SITE_OTHER): Payer: Self-pay | Admitting: Nurse Practitioner

## 2019-06-16 DIAGNOSIS — J029 Acute pharyngitis, unspecified: Secondary | ICD-10-CM | POA: Insufficient documentation

## 2019-06-16 NOTE — Assessment & Plan Note (Signed)
COVID-19 Plan:    Status:   Moderate symptoms with no known exposure to COVID-19 patient (community exposure)  Diagnostics:   Patient has the following risk factors: symptomatic  Treatment:    Acetaminophen (Tylenol) 500-1000mg  PO Q6 hrs PRN and Vitamin C 500mg  twice a day (oxidative support)  Recommendations:   Counseled patient on COVID-19 risk factors, symptoms, CDC guidelines and management.  Home infection control precautions reviewed with patient.   Pt instructed to self-isolate. Instructions for self-isolation provided to patient.  Pt instructed to discontinue home isolation after at least 72 hours have passed since recovery from fever, improvement in respiratory symptoms, and at least 10 days have passed since symptoms first appeared.  Pt instructed to seek immediate medical attention for worsening cough, shortness of breath, symptoms of hypoxia, or confusion.

## 2019-06-18 NOTE — Progress Notes (Signed)
This encounter was created in error - please disregard.    Items noted as "reviewed" are for administrative purposes only and are not guaranteed by the provider to be accurate on this date.

## 2019-06-23 ENCOUNTER — Encounter (INDEPENDENT_AMBULATORY_CARE_PROVIDER_SITE_OTHER): Payer: Self-pay

## 2019-06-23 NOTE — Progress Notes (Addendum)
Subjective:         Patient ID: [name] is a 63 y.o. female.      Chief Complaint:  Chief Complaint   Patient presents with   . Annual Exam     Pt is fasting    . Flu Vaccine       HPI    Deondra Labrador is a patient of Dr. Milas Gain, Grover Canavan, MD here today for an annual physical exam:    Having a flair up of her CTS R arm. + phalens today.     Has anxiety worse since the death of her sister this year. Has tried many meds in the past. Does not want to try any more right now. Desires xanax only which I do not prescribe. She is willing to see psychiatry for an evaluation. Referral given.     Social History     Social History Narrative    Caffeine: drinks decaff tea    Diet: tries to stay well balanced    Exercise: not really. 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  Safety Elements Used: uses seat belts, smoke detectors in household, does not text and drive and no guns at home    Tetanus:last tdap 2015  Flu vaccine:Due today. Will vaccinate.   Pneumovax:needs at age 29  Shingles vaccine: Recommended Shingrix vaccination at local pharmacy  Colonoscopy: due again next year. Goes every 5 years.   Pap smear: goes to the GYN annually all normal.   Mammogram: due this year. Goes every year  DEXA: osteopenia 2019. Due again 2021  Dermatology:   Hepatitis C:    Care team:  Patient Care Team:  [name], MD as PCP - General (Internal Medicine)  Cynda Familia, MD as Consulting Physician (Orthopaedic Surgery)  Carmie End as Social Worker    Problem List:  Patient Active Problem List   Diagnosis   . Neck pain   . Low back pain   . Essential hypertension   . Primary  osteoarthritis of both knees   . Anxiety   . Left knee DJD   . Lumbar disc disease   . Family history of breast cancer   . Smoker   . Chronic insomnia   . HSV-2   . Carpal tunnel syndrome of right wrist   . GAD (generalized anxiety disorder)       Current Meds:  Outpatient Medications Marked as Taking for the 07/01/19 encounter (Office Visit) with Aviva Signs, DNP FNP   Medication Sig Dispense Refill   . ALPRAZolam (XANAX) 0.5 MG tablet Take 0.5 mg by mouth nightly as needed     . Ascorbic Acid (VITAMIN C PO) Take by mouth     . DENTA 5000 PLUS 1.1 % Cream BRUSH FOR 2 MINS TWICE DAILY.EXPECTORATE, DO NOT RINSE. NO EAT/DRINK X30MINS 57 g 11   . losartan-hydrochlorothiazide (HYZAAR) 100-12.5 MG per tablet TAKE 1 TABLET BY MOUTH EVERY DAY 90 tablet 3   . vitamin D (CHOLECALCIFEROL) 25 MCG (1000 UT) tablet Take 1,000 Units by mouth daily     .  VITAMIN E PO Take by mouth          Allergies:  Allergies   Allergen Reactions   . Latex Rash         Past Medical History:  Past Medical History:   Diagnosis Date   . Abnormal vision     glasses   . Anxiety     uses xanax @ bedtime   . Arthritis     bilateral knees/OA hips/back   . Carpal tunnel syndrome on both sides     stiffness noted    . Difficulty walking     limps   . Eczema     has prescription cream   . Fracture     finger. resolved   . Gastroesophageal reflux disease     heartburn recently diet related/some problem swallowing   . History of gonorrhea     teenager   . Hypertensive disorder     stable meds   . Lower back pain     sciatica radiates down LLE: Pain range= 0-10   . Neck pain     related to lower back but significant at this time    . Neuropathy     numbness upon awakening from low back radiating down LLE .Marland Kitchen. diminishes after awhile after moving around   . Pneumonia     bronchitis in past   . Vein disorder     no support hose used. fixed surgically        Past Surgical History:  Past Surgical History:   Procedure Laterality Date   . ARTHROPLASTY, KNEE,  TOTAL Left 12/14/2015    Procedure: ARTHROPLASTY, KNEE, TOTAL;  Surgeon: Cynda Familia, MD;  Location: [facility];  Service: Orthopedics;  Laterality: Left;  LEFT TOTAL KNEE ARTHROPLASTY   . COLONOSCOPY  2016    X1 polyp   . ENDOSCOPIC CARPAL TUNNEL RELEASE Right 03/02/2017    Procedure: ENDOSCOPIC CARPAL TUNNEL RELEASE;  Surgeon: Virgilio Frees., MD;  Location: [facility];  Service: Orthopedics;  Laterality: Right;   . GANGLION CYST EXCISION Right 2007    x2   . INDUCED ABORTION     . KNEE ARTHROSCOPY Right 09/24/2014   . KNEE ARTHROSCOPY Left 03/2015   . REPAIR, UPPER EXTREMITY TENDON Right 03/02/2017    Procedure: REPAIR, UPPER EXTREMITY TENDON, EXCISION, GANGLION CYST;  Surgeon: Virgilio Frees., MD;  Location: [facility];  Service: Orthopedics;  Laterality: Right;  right wrist flexor carpi radialis tenosynovectomy w/ possible carpal tunnel release and soft tissue mass excision  q1-unk, asst=n, equip=needle tip bovie, beaver blade, lead hand, micro aire, md req 90 min/ok per JD   . TONSILLECTOMY     . VAGINAL DELIVERY      X2 spinals ( 6 natural deliveries)         Family History:  Family History   Problem Relation Age of Onset   . Hypertension Mother    . Stroke Mother    . Hypertension Father    . Arthritis Father    . Cancer Sister         breast   . Kidney disease Brother    . Heart disease Maternal Grandmother          Social History:  Social History     Tobacco Use   . Smoking status: Current Every Day Smoker     Packs/day: 0.25     Years: 30.00  Pack years: 7.50     Types: Cigarettes   . Smokeless tobacco: Never Used   . Tobacco comment: a pack lasts several days   Substance Use Topics   . Alcohol use: No   . Drug use: No        The following sections were reviewed this encounter by the provider:   Tobacco  Allergies  Meds  Problems  Med Hx  Surg Hx  Fam Hx               Review Of Systems  Constitutional:  Denies fever or chills   Eyes:  Denies change in  visual acuity   HENT:  Denies nasal congestion or sore throat   Respiratory:  Denies cough or shortness of breath   Cardiovascular:  Denies chest pain or edema   GI:  Denies abdominal pain, nausea, vomiting, bloody stools or diarrhea   GU:  Denies dysuria   Musculoskeletal:  Denies back pain or joint pain   Integument:  Denies rash   Neurologic:  Denies headache, focal weakness or sensory changes   Endocrine:  Denies polyuria or polydipsia   Lymphatic:  Denies swollen glands   Psychiatric:  Denies acute depression or anxiety     Objective:     Vital Signs  BP 131/86   Pulse 98   Temp 98.2 F (36.8 C) (Oral)   Ht 1.56 m (5' 1.42")   Wt 83 kg (183 lb)   BMI 34.11 kg/m      Physical Exam  General Appearance: Alert, well nourished.  No acute distress  Head: Normocephalic, no lymphadenopathy.  Sinuses non-tender.  Eyes: PERRLA.  Sclera white.  Conjunctiva non-injected  Ears:  Ear canals without deformity.  TMs pearly white bilaterally.  Nose: Normal mucosa and septum. Nares patent  Mouth:  Oral mucosa moist and pink.  Midline uvula.  Oropharynx no erythema or exudate.  Neck: Supple, no masses, no lymphadenopathy.  No thyromegaly. Trachia midline   Heart: S1, S2 normal, no murmurs, rubs, gallops, regular rate and rhythm.   Lungs: normal effort. no distress, breath sounds clear to auscultation bilaterally, no wheezes, rales, rhonchi.   Back: No spinal tenderness.  No CVA tenderness  Abdomen: Bowel sounds present. soft, non-distended, non tender no masses.  No Hepatosplenomegaly.   Extremities: warm. No clubbing, cyanosis or edema.   Peripheral Pulses: 2+ radial, 2+ dorsalis pedis, 2+ posterior tibial.   Neurological: Strength and sensation grossly intact.  Face Symmetrical   Psych: alert, calm, appropriate. Oriented x3, cognitive function intact.      Assessment/Plan:       Problem List Items Addressed This Visit        Nervous and Auditory    Carpal tunnel syndrome of right wrist     Wear wrist splint nightly.    Neutral angle wrists when typing etc.   Follow up with ortho if no improvement.             Other    Smoker (Chronic)     Smoking Cessation Counseling:    Date: July 01, 2019    Advice/Assessment: Patient strongly urged to quit tobacco use  RELEVANCE - patient counseled on how quitting may be personally relevant to the following topics: leading a longer and better quality of life and decreased risk of cardiopulmonary disease  RISKS -  patient counseled on the following risks: short term risks (including dyspnea, exacerbation of asthma, impotence, infertility), long term risks (including MI, CVA, COPD,  lung cancer, oropharynx cancer, esophageal cancer, and long term disability) and environmental risks (including increased risk of cancer in spouse, higher rates of smoking by children of tobacco users, and increased risk of asthma, middle ear disease and respiratory infections in children of smokers)  REWARDS - patient counseled on the following rewards/benefits: improved health and additional years of life  RECOMMENDATIONS - patient to consider treatment options. not ready for now.     Total counseling time: less than 3 minutes           GAD (generalized anxiety disorder)     referal to psych for formal eval and treatment         Relevant Orders    Ambulatory referral to Psychiatry      Other Visit Diagnoses     Annual physical exam    -  Primary    Relevant Orders    Comprehensive metabolic panel    Lipid panel    Vitamin D,25 OH, Total    Hemoglobin A1C    TSH    T4, free    Skin cancer screening        Relevant Orders    Ambulatory referral to Dermatology    Need for vaccination        Relevant Orders    Flu vacc quad recombinant pres free 18 yrs& up (Completed)    Encounter for screening mammogram for malignant neoplasm of breast        Relevant Orders    Mammo Digital Screening Bilateral W Cad    Need for hepatitis C screening test        Relevant Orders    Hepatitis C (HCV) antibody, Total          Age  61-64: Counseling/Anticipatory Guidance:  Nutrition, physical activity, healthy weight, injury prevention, misuse of tobacco, alcohol and drugs, sexual behavior and STDs, contraception, dental health, mental health, immunizations, screenings      Follow-up:   Return in about 1 year (around 06/30/2020) for an annual physical exam, or as needed.         Aviva Signs, DNP FNP    *Portions of this note may be dictated using Dragon naturally speaking voice recognition software. Variances in spelling and vocabulary are possible and unintentional. Not all areas may be caught/corrected. Please notify me of any discrepancies are noted or if the meaning of any statement is not correct/clear.*

## 2019-06-23 NOTE — Patient Instructions (Addendum)
PATIENT INSTRUCTIONS:  It is a pleasure seeing you today  If I have ordered some labs on you, I will notify you of the results and any further instructions as necessary  Wear your CT brace every single night.   Please get the new shingles vaccine [facility]) from your local pharmacy. It is two injections- first one and the second 2-6 months later.   Return in about 1 year (around 06/30/2020) for an annual physical exam, or as needed.     [facility]  [address]  [place], [place] [address]  Central Access phone:  [phone]     [facility]) at [facility] location:  [facility]- 12pm-8pmMonday through Saturday    [address]  [address]  [place], [place] [address]    Dr. Rosealee Albee  [address]  [place], [place] [address]  [phone]      Serita Kyle, M.D.- has interest in ADHD  [address]  [place], [place] [address]  Phone:  [phone]  Email:   [email or web address]      [facility]  [address]  [place], [place]  [address]  Phone:  [phone]    [facility]  [address] [place], [place] [address]  [phone]    [facility]  [address] [place], [place] [address]   [phone]    Dr. Raul Del   [address] [place], [place] [address]   [phone]     Dr. Kingsley Callander or Dr. Rozanna Box   [address] [place], [place] [address]   [phone]     Dr. Charlcie Cradle   [address] [place], [place] [address]   [phone]     Dr. Maisie Fus Fogarty   [address] [place], [place] [address]   [phone]     Dr. Benita Stabile   [address] [place] ,[place] [address]   [phone]     [facility]   [address] [place], [place] [address]   [phone]     Earley Abide, MD  [address]  [place], [place] [address]  Phone [phone]      Lebron Quam, MD- interest in reproductive psychiatry such as PMDD  [address]   [place], [place]  [phone]         Local Dermatology Offices    *[facility]   Marylouise Stacks   [phone]   [email or web address]    *[facility]  Dr. Mamie Laurel or Dr. Jesse Sans, [place]  [address] [place], [place] [address]  [phone]    [facility] [place]  Drs. Weston Anna, [place]  [phone]  [address] [place]  [place], [place] [address]    Drs. Sawchuk & Masri-Fridling   Dr. Inocencio Homes Masri-Fridling   Dr. Drinda Butts    [address]       [place], [place] [address]       [address]  [place], [place] [address]  Phone: [phone]  Fax: [phone]    [facility] [place], [place]  Fleeta Emmer, M.D.   Murtis Sink, MD    [address]  [place],  [place] [address]   Phone: [phone]  Fax: [phone]     [facility]  . Dr. Marisa Severin. [place]  . Dr. Nira Retort. [place]  [address]  [place], [place] [address]   Phone: [phone]  Fax: [phone]    [facility]  Suszanne Conners MD   Joycelyn Das, M.D.    [address]  [place], [place] [address]  [phone]

## 2019-06-26 ENCOUNTER — Encounter (INDEPENDENT_AMBULATORY_CARE_PROVIDER_SITE_OTHER): Payer: Self-pay

## 2019-06-27 ENCOUNTER — Encounter (INDEPENDENT_AMBULATORY_CARE_PROVIDER_SITE_OTHER): Payer: Self-pay

## 2019-07-01 ENCOUNTER — Ambulatory Visit (INDEPENDENT_AMBULATORY_CARE_PROVIDER_SITE_OTHER): Payer: No Typology Code available for payment source | Admitting: Nurse Practitioner

## 2019-07-01 ENCOUNTER — Encounter (INDEPENDENT_AMBULATORY_CARE_PROVIDER_SITE_OTHER): Payer: Self-pay | Admitting: Nurse Practitioner

## 2019-07-01 VITALS — BP 131/86 | HR 98 | Temp 98.2°F | Ht 61.42 in | Wt 183.0 lb

## 2019-07-01 DIAGNOSIS — Z0000 Encounter for general adult medical examination without abnormal findings: Secondary | ICD-10-CM

## 2019-07-01 DIAGNOSIS — F17200 Nicotine dependence, unspecified, uncomplicated: Secondary | ICD-10-CM

## 2019-07-01 DIAGNOSIS — F411 Generalized anxiety disorder: Secondary | ICD-10-CM

## 2019-07-01 DIAGNOSIS — G5601 Carpal tunnel syndrome, right upper limb: Secondary | ICD-10-CM

## 2019-07-01 DIAGNOSIS — Z1231 Encounter for screening mammogram for malignant neoplasm of breast: Secondary | ICD-10-CM

## 2019-07-01 DIAGNOSIS — Z1159 Encounter for screening for other viral diseases: Secondary | ICD-10-CM

## 2019-07-01 DIAGNOSIS — Z1283 Encounter for screening for malignant neoplasm of skin: Secondary | ICD-10-CM

## 2019-07-01 DIAGNOSIS — Z23 Encounter for immunization: Secondary | ICD-10-CM

## 2019-07-01 LAB — HEMOGLOBIN A1C
Average Estimated Glucose: 111.2 mg/dL
Hemoglobin A1C: 5.5 % (ref 4.6–5.9)

## 2019-07-01 NOTE — Assessment & Plan Note (Signed)
Wear wrist splint nightly.   Neutral angle wrists when typing etc.   Follow up with ortho if no improvement.

## 2019-07-01 NOTE — Assessment & Plan Note (Signed)
Smoking Cessation Counseling:    Date: July 01, 2019    Advice/Assessment: Patient strongly urged to quit tobacco use  RELEVANCE - patient counseled on how quitting may be personally relevant to the following topics: leading a longer and better quality of life and decreased risk of cardiopulmonary disease  RISKS -  patient counseled on the following risks: short term risks (including dyspnea, exacerbation of asthma, impotence, infertility), long term risks (including MI, CVA, COPD, lung cancer, oropharynx cancer, esophageal cancer, and long term disability) and environmental risks (including increased risk of cancer in spouse, higher rates of smoking by children of tobacco users, and increased risk of asthma, middle ear disease and respiratory infections in children of smokers)  REWARDS - patient counseled on the following rewards/benefits: improved health and additional years of life  RECOMMENDATIONS - patient to consider treatment options. not ready for now.     Total counseling time: less than 3 minutes

## 2019-07-01 NOTE — Assessment & Plan Note (Signed)
referal to psych for formal eval and treatment

## 2019-07-02 LAB — COMPREHENSIVE METABOLIC PANEL
ALT: 38 U/L (ref 0–55)
AST (SGOT): 35 U/L — ABNORMAL HIGH (ref 5–34)
Albumin/Globulin Ratio: 1.1 (ref 0.9–2.2)
Albumin: 4 g/dL (ref 3.5–5.0)
Alkaline Phosphatase: 84 U/L (ref 37–106)
Anion Gap: 11 (ref 5.0–15.0)
BUN: 12 mg/dL (ref 7.0–19.0)
Bilirubin, Total: 0.5 mg/dL (ref 0.2–1.2)
CO2: 23 mEq/L (ref 21–29)
Calcium: 9.6 mg/dL (ref 8.5–10.5)
Chloride: 105 mEq/L (ref 100–111)
Creatinine: 0.8 mg/dL (ref 0.4–1.5)
Globulin: 3.6 g/dL (ref 2.0–3.7)
Glucose: 106 mg/dL — ABNORMAL HIGH (ref 70–100)
Potassium: 4 mEq/L (ref 3.5–5.1)
Protein, Total: 7.6 g/dL (ref 6.0–8.3)
Sodium: 139 mEq/L (ref 136–145)

## 2019-07-02 LAB — TSH: TSH: 0.8 u[IU]/mL (ref 0.35–4.94)

## 2019-07-02 LAB — GFR: EGFR: >60

## 2019-07-02 LAB — LIPID PANEL
Cholesterol / HDL Ratio: 4.3
Cholesterol: 192 mg/dL (ref 0–199)
HDL: 45 mg/dL (ref 40–9999)
LDL Calculated: 126 mg/dL — ABNORMAL HIGH (ref 0–99)
Triglycerides: 104 mg/dL (ref 34–149)
VLDL Calculated: 21 mg/dL (ref 10–40)

## 2019-07-02 LAB — T4, FREE: T4 Free: 1.04 ng/dL (ref 0.70–1.48)

## 2019-07-02 LAB — HEPATITIS C ANTIBODY: Hepatitis C, AB: REACTIVE — AB

## 2019-07-02 LAB — VITAMIN D,25 OH,TOTAL: Vitamin D, 25 OH, Total: 66 ng/mL (ref 30–100)

## 2019-07-02 LAB — HEMOLYSIS INDEX: Hemolysis Index: 12 (ref 0–18)

## 2019-07-07 LAB — HEPATITIS C RNA, QUANTITATIVE REAL-TIME PCR: HCV RNA, Quant: 8070000 — AB

## 2019-07-09 ENCOUNTER — Telehealth (INDEPENDENT_AMBULATORY_CARE_PROVIDER_SITE_OTHER): Payer: Self-pay | Admitting: Internal Medicine

## 2019-07-09 ENCOUNTER — Other Ambulatory Visit (INDEPENDENT_AMBULATORY_CARE_PROVIDER_SITE_OTHER): Payer: Self-pay | Admitting: Nurse Practitioner

## 2019-07-09 DIAGNOSIS — B182 Chronic viral hepatitis C: Secondary | ICD-10-CM

## 2019-07-09 DIAGNOSIS — B1920 Unspecified viral hepatitis C without hepatic coma: Secondary | ICD-10-CM | POA: Insufficient documentation

## 2019-07-09 NOTE — Telephone Encounter (Signed)
Reekala called from [facility] and asked if we can fax over the labs that were done on 07/02/19. Faxed  To [phone].

## 2019-07-24 ENCOUNTER — Other Ambulatory Visit (INDEPENDENT_AMBULATORY_CARE_PROVIDER_SITE_OTHER): Payer: Self-pay

## 2019-07-24 ENCOUNTER — Encounter (INDEPENDENT_AMBULATORY_CARE_PROVIDER_SITE_OTHER): Payer: Self-pay | Admitting: Nurse Practitioner

## 2019-07-24 DIAGNOSIS — Z1231 Encounter for screening mammogram for malignant neoplasm of breast: Secondary | ICD-10-CM

## 2019-07-27 ENCOUNTER — Encounter (INDEPENDENT_AMBULATORY_CARE_PROVIDER_SITE_OTHER): Payer: Self-pay

## 2019-07-28 ENCOUNTER — Encounter (INDEPENDENT_AMBULATORY_CARE_PROVIDER_SITE_OTHER): Payer: Self-pay

## 2019-08-12 ENCOUNTER — Encounter (INDEPENDENT_AMBULATORY_CARE_PROVIDER_SITE_OTHER): Payer: Self-pay

## 2019-08-13 ENCOUNTER — Encounter (INDEPENDENT_AMBULATORY_CARE_PROVIDER_SITE_OTHER): Payer: Self-pay

## 2019-08-21 ENCOUNTER — Other Ambulatory Visit: Payer: Self-pay | Admitting: Gastroenterology

## 2019-08-24 ENCOUNTER — Ambulatory Visit (INDEPENDENT_AMBULATORY_CARE_PROVIDER_SITE_OTHER): Payer: No Typology Code available for payment source

## 2019-08-24 DIAGNOSIS — Z1159 Encounter for screening for other viral diseases: Secondary | ICD-10-CM

## 2019-08-24 DIAGNOSIS — Z01818 Encounter for other preprocedural examination: Secondary | ICD-10-CM

## 2019-08-25 LAB — COVID-19 (SARS-COV-2): SARS CoV 2 Overall Result: NOT DETECTED

## 2019-08-26 ENCOUNTER — Encounter (INDEPENDENT_AMBULATORY_CARE_PROVIDER_SITE_OTHER): Payer: Self-pay

## 2019-08-27 ENCOUNTER — Encounter (INDEPENDENT_AMBULATORY_CARE_PROVIDER_SITE_OTHER): Payer: Self-pay

## 2019-10-10 ENCOUNTER — Other Ambulatory Visit: Payer: Self-pay | Admitting: Gastroenterology

## 2019-12-03 ENCOUNTER — Encounter (INDEPENDENT_AMBULATORY_CARE_PROVIDER_SITE_OTHER): Payer: Self-pay | Admitting: Internal Medicine

## 2019-12-03 ENCOUNTER — Telehealth (INDEPENDENT_AMBULATORY_CARE_PROVIDER_SITE_OTHER): Payer: Self-pay | Admitting: Internal Medicine

## 2019-12-03 NOTE — Telephone Encounter (Signed)
On-call physicians note    I responded to patient's phone call to answering service.    Patient told me that she is having increasing anxiety for past 2 months and has difficulty in sleeping.  Patient has previously been diagnosed to have anxiety disorder and was taking Xanax but has been stopped for 1 year.  She currently is not on any medication for anxiety.    She saw gastroenterologist and was prescribed medication for hepatitis C infection.  Patient thinks that medicine might make making her more anxious.    Advised patient to discuss with her gastroenterologist Dr. Jodie Echevaria tomorrow regarding medication side effects.    Also advised patient to schedule a follow-up appointment with PCP in the next few days.    [name]  12/03/2019

## 2020-01-05 ENCOUNTER — Other Ambulatory Visit (INDEPENDENT_AMBULATORY_CARE_PROVIDER_SITE_OTHER): Payer: Self-pay | Admitting: Internal Medicine

## 2020-02-21 ENCOUNTER — Other Ambulatory Visit (INDEPENDENT_AMBULATORY_CARE_PROVIDER_SITE_OTHER): Payer: Self-pay | Admitting: Internal Medicine

## 2020-02-21 DIAGNOSIS — I10 Essential (primary) hypertension: Secondary | ICD-10-CM

## 2020-02-23 NOTE — Telephone Encounter (Signed)
Due for phys

## 2020-03-21 ENCOUNTER — Emergency Department: Payer: No Typology Code available for payment source

## 2020-03-21 ENCOUNTER — Emergency Department
Admission: EM | Admit: 2020-03-21 | Discharge: 2020-03-21 | Disposition: A | Discharge status: Home / Self Care | Payer: No Typology Code available for payment source | Attending: Emergency Medicine | Admitting: Emergency Medicine

## 2020-03-21 DIAGNOSIS — K219 Gastro-esophageal reflux disease without esophagitis: Secondary | ICD-10-CM | POA: Insufficient documentation

## 2020-03-21 DIAGNOSIS — F17210 Nicotine dependence, cigarettes, uncomplicated: Secondary | ICD-10-CM | POA: Insufficient documentation

## 2020-03-21 DIAGNOSIS — I10 Essential (primary) hypertension: Secondary | ICD-10-CM | POA: Insufficient documentation

## 2020-03-21 DIAGNOSIS — R197 Diarrhea, unspecified: Secondary | ICD-10-CM | POA: Insufficient documentation

## 2020-03-21 DIAGNOSIS — Z8619 Personal history of other infectious and parasitic diseases: Secondary | ICD-10-CM | POA: Insufficient documentation

## 2020-03-21 DIAGNOSIS — K449 Diaphragmatic hernia without obstruction or gangrene: Secondary | ICD-10-CM | POA: Insufficient documentation

## 2020-03-21 DIAGNOSIS — R109 Unspecified abdominal pain: Secondary | ICD-10-CM | POA: Insufficient documentation

## 2020-03-21 DIAGNOSIS — K5730 Diverticulosis of large intestine without perforation or abscess without bleeding: Secondary | ICD-10-CM | POA: Insufficient documentation

## 2020-03-21 DIAGNOSIS — R110 Nausea: Secondary | ICD-10-CM | POA: Insufficient documentation

## 2020-03-21 LAB — URINALYSIS REFLEX TO MICROSCOPIC EXAM - REFLEX TO CULTURE
Bilirubin, UA: NEGATIVE
Blood, UA: NEGATIVE
Glucose, UA: NEGATIVE
Ketones UA: NEGATIVE
Leukocyte Esterase, UA: NEGATIVE
Nitrite, UA: NEGATIVE
Protein, UR: NEGATIVE
Specific Gravity UA: 1.01 (ref 1.001–1.035)
Urine pH: 6 (ref 5.0–8.0)
Urobilinogen, UA: NORMAL mg/dL (ref 0.2–2.0)

## 2020-03-21 LAB — COMPREHENSIVE METABOLIC PANEL
ALT: 46 U/L (ref 0–55)
AST (SGOT): 33 U/L (ref 5–34)
Albumin/Globulin Ratio: 1.1 (ref 0.9–2.2)
Albumin: 4 g/dL (ref 3.5–5.0)
Alkaline Phosphatase: 89 U/L (ref 37–106)
Anion Gap: 11 (ref 5.0–15.0)
BUN: 8 mg/dL (ref 7–19)
Bilirubin, Total: 0.5 mg/dL (ref 0.2–1.2)
CO2: 24 mEq/L (ref 22–29)
Calcium: 10.6 mg/dL — ABNORMAL HIGH (ref 8.5–10.5)
Chloride: 106 mEq/L (ref 100–111)
Creatinine: 0.9 mg/dL (ref 0.6–1.0)
Globulin: 3.6 g/dL (ref 2.0–3.6)
Glucose: 89 mg/dL (ref 70–100)
Potassium: 4.4 mEq/L (ref 3.5–5.1)
Protein, Total: 7.6 g/dL (ref 6.0–8.3)
Sodium: 141 mEq/L (ref 136–145)

## 2020-03-21 LAB — MAN DIFF ONLY
Band Neutrophils Absolute: 0 10*3/uL (ref 0.00–1.00)
Band Neutrophils: 0 %
Basophils Absolute Manual: 0 10*3/uL (ref 0.00–0.08)
Basophils Manual: 0 %
Eosinophils Absolute Manual: 0.14 10*3/uL (ref 0.00–0.44)
Eosinophils Manual: 2 %
Lymphocytes Absolute Manual: 3.35 10*3/uL — ABNORMAL HIGH (ref 0.42–3.22)
Lymphocytes Manual: 47 %
Monocytes Absolute: 0.57 10*3/uL (ref 0.21–0.85)
Monocytes Manual: 8 %
Neutrophils Absolute Manual: 3.07 10*3/uL (ref 1.10–6.33)
Segmented Neutrophils: 43 %

## 2020-03-21 LAB — CBC AND DIFFERENTIAL
Absolute NRBC: 0 10*3/uL (ref 0.00–0.00)
Hematocrit: 45.8 % — ABNORMAL HIGH (ref 34.7–43.7)
Hgb: 14.9 g/dL — ABNORMAL HIGH (ref 11.4–14.8)
MCH: 30.3 pg (ref 25.1–33.5)
MCHC: 32.5 g/dL (ref 31.5–35.8)
MCV: 93.1 fL (ref 78.0–96.0)
MPV: 11.5 fL (ref 8.9–12.5)
Nucleated RBC: 0 /100 WBC (ref 0.0–0.0)
Platelets: 274 10*3/uL (ref 142–346)
RBC: 4.92 10*6/uL (ref 3.90–5.10)
RDW: 13 % (ref 11–15)
WBC: 7.13 10*3/uL (ref 3.10–9.50)

## 2020-03-21 LAB — GFR: EGFR: >60

## 2020-03-21 LAB — LIPASE: Lipase: 18 U/L (ref 8–78)

## 2020-03-21 LAB — CELL MORPHOLOGY
Cell Morphology: NORMAL
Platelet Estimate: NORMAL

## 2020-03-21 LAB — C-REACTIVE PROTEIN: C-Reactive Protein: <0.1 mg/dL (ref 0.0–0.8)

## 2020-03-21 MED ORDER — MORPHINE SULFATE 4 MG/ML IJ/IV SOLN (WRAP)
4.0000 mg | Freq: Once | Status: AC
Start: 2020-03-21 — End: 2020-03-21
  Administered 2020-03-21: 14:00:00 4 mg via INTRAVENOUS
  Filled 2020-03-21: qty 1

## 2020-03-21 MED ORDER — ONDANSETRON HCL 4 MG/2ML IJ SOLN
4.00 mg | Freq: Once | INTRAMUSCULAR | Status: AC
Start: 2020-03-21 — End: 2020-03-21
  Administered 2020-03-21: 14:00:00 4 mg via INTRAVENOUS
  Filled 2020-03-21: qty 2

## 2020-03-21 MED ORDER — IOHEXOL 350 MG/ML IV SOLN
100.00 mL | Freq: Once | INTRAVENOUS | Status: AC | PRN
Start: 2020-03-21 — End: 2020-03-21
  Administered 2020-03-21: 15:00:00 100 mL via INTRAVENOUS

## 2020-03-21 MED ORDER — SODIUM CHLORIDE 0.9 % IV BOLUS
1000.00 mL | Freq: Once | INTRAVENOUS | Status: AC
Start: 2020-03-21 — End: 2020-03-21
  Administered 2020-03-21: 16:00:00 1000 mL via INTRAVENOUS

## 2020-03-21 MED ORDER — ONDANSETRON 4 MG PO TBDP
4.00 mg | ORAL_TABLET | Freq: Four times a day (QID) | ORAL | 0 refills | Status: DC | PRN
Start: 2020-03-21 — End: 2020-03-26

## 2020-03-21 MED ORDER — SODIUM CHLORIDE 0.9 % IV BOLUS
1000.00 mL | Freq: Once | INTRAVENOUS | Status: AC
Start: 2020-03-21 — End: 2020-03-21
  Administered 2020-03-21: 14:00:00 1000 mL via INTRAVENOUS

## 2020-03-21 MED ORDER — KETOROLAC TROMETHAMINE 15 MG/ML IJ SOLN
15.00 mg | Freq: Once | INTRAMUSCULAR | Status: AC
Start: 2020-03-21 — End: 2020-03-21
  Administered 2020-03-21: 14:00:00 15 mg via INTRAVENOUS
  Filled 2020-03-21: qty 1

## 2020-03-21 NOTE — Discharge Instructions (Signed)
[place] Abdominal Pain    Today you were evaluated for abdominal pain. It may seem strange to go home without a diagnosis of what is causing your abdominal pain. As many as half of patients who come to the Emergency Department with abdominal pain, do not receive a clear diagnosis.     This may be because nothing serious is wrong, OR there may be something significant going on and it is too early to tell exactly what it is with the information we have available to us today.     There are three important things to know:     (1) We have NOT given you a DEFINITE diagnosis of what is causing the pain, but based on your history and exam, we do not think it is serious at this time. Since you do not have a definite diagnosis, you must still BE VERY WATCHFUL and RETURN IMMEDIATELY if the symptoms fail to improve or worsen. There is always a small, but real possibility, that there is something more seriously wrong, such as appendicitis.     (2) We recommend that a physician see you within the next 12-24 hours for a re-evaluation if the symptoms persist. If your physician is unavailable, we would be happy to see you here.     (3) We feel comfortable sending you home at this time, but there are some things that worry us more. We want you to return immediately to the Emergency Department if you develop:     Increasing pain -- or pain that does not go away   Persistent vomiting, especially if there is no diarrhea associated with this illness    Vomiting blood or blood in your stool. Blood can be bright red, dark red, or black and tarry if it is digested   Passing out or feeling extremely lightheaded   Yellowing of your skin or eyes   Uncontrollable diarrhea

## 2020-03-21 NOTE — ED Provider Notes (Signed)
[facility]  [place] [facility] EMERGENCY DEPARTMENT HISTORY & PHYSICAL EXAM      Patient Information     Patient Name: [name], [name]  Encounter Date:  03/21/2020  Patient DOB:  [birth year]  MRN:  [id number]  Room:  34/SS 34  Rendering Provider: Forest Gleason, M.D.    History of Presenting Illness     Chief Complaint: abd pain  Historian: History obtained from: Patient  Onset: 3 day(s) ago  Duration: 3 day(s)  Quality: persistent  Severity: Severe   Location: diffuse  Pain Quality: achy/crampy  Associated with: nausea/a little diarrhea    HPI Comments:   [name] is a 64 y.o. female with no previous abdominal surgery, previous history of hepatitis C that was treated by GI who started on Friday with abdominal cramping and discomfort.  She had a little bit of diarrhea.  No significant bowel movement otherwise.  Nausea but no vomiting.  Pain has been persistent and getting worse.  She went to urgent care and was referred to the emergency department for CT scan.      PMD: [name], MD    Past Medical History     Past Medical History:   Diagnosis Date   . Abnormal vision     glasses   . Anxiety     uses xanax @ bedtime   . Arthritis     bilateral knees/OA hips/back   . Carpal tunnel syndrome on both sides     stiffness noted    . Difficulty walking     limps   . Eczema     has prescription cream   . Fracture     finger. resolved   . Gastroesophageal reflux disease     heartburn recently diet related/some problem swallowing   . History of gonorrhea     teenager   . Hypertensive disorder     stable meds   . Lower back pain     sciatica radiates down LLE: Pain range= 0-10   . Neck pain     related to lower back but significant at this time    . Neuropathy     numbness upon awakening from low back radiating down LLE .Marland Kitchen. diminishes after awhile after moving around   . Pneumonia     bronchitis in past   . Vein disorder     no support hose used. fixed surgically       Past  Surgical History     Past Surgical History:   Procedure Laterality Date   . ARTHROPLASTY, KNEE, TOTAL Left 12/14/2015    Procedure: ARTHROPLASTY, KNEE, TOTAL;  Surgeon: Cynda Familia, MD;  Location: [place] MAIN OR;  Service: Orthopedics;  Laterality: Left;  LEFT TOTAL KNEE ARTHROPLASTY   . COLONOSCOPY  2016    X1 polyp   . ENDOSCOPIC CARPAL TUNNEL RELEASE Right 03/02/2017    Procedure: ENDOSCOPIC CARPAL TUNNEL RELEASE;  Surgeon: Virgilio Frees., MD;  Location: [place] MAIN OR;  Service: Orthopedics;  Laterality: Right;   . GANGLION CYST EXCISION Right 2007    x2   . INDUCED ABORTION     . KNEE ARTHROSCOPY Right 09/24/2014   . KNEE ARTHROSCOPY Left 03/2015   . REPAIR, UPPER EXTREMITY TENDON Right 03/02/2017    Procedure: REPAIR, UPPER EXTREMITY TENDON, EXCISION, GANGLION CYST;  Surgeon: Virgilio Frees., MD;  Location: [place] MAIN OR;  Service: Orthopedics;  Laterality: Right;  right  wrist flexor carpi radialis tenosynovectomy w/ possible carpal tunnel release and soft tissue mass excision  q1-unk, asst=n, equip=needle tip bovie, beaver blade, lead hand, micro aire, md req 90 min/ok per JD   . TONSILLECTOMY     . VAGINAL DELIVERY      X2 spinals ( 6 natural deliveries)       Family History     Family History   Problem Relation Age of Onset   . Hypertension Mother    . Stroke Mother    . Hypertension Father    . Arthritis Father    . Cancer Sister         breast   . Kidney disease Brother    . Heart disease Maternal Grandmother        Social History     Social History     Socioeconomic History   . Marital status: Single     Spouse name: Not on file   . Number of children: Not on file   . Years of education: Not on file   . Highest education level: Not on file   Occupational History   . Not on file   Tobacco Use   . Smoking status: Current Every Day Smoker     Packs/day: 0.25     Years: 30.00     Pack years: 7.50     Types: Cigarettes   . Smokeless tobacco: Never Used   . Tobacco comment: a  pack lasts several days   Vaping Use   . Vaping Use: Never used   Substance and Sexual Activity   . Alcohol use: No   . Drug use: No   . Sexual activity: Not Currently     Partners: Male   Other Topics Concern   . Not on file   Social History Narrative    Caffeine: drinks decaff tea    Diet: tries to stay well balanced    Exercise: not really. 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 Difficulty of Paying Living Expenses:    Food Insecurity:    . Worried About Programme researcher, broadcasting/film/video in the Last Year:    . Barista in the Last Year:    Transportation Needs:    . Freight forwarder (Medical):    Marland Kitchen Lack of Transportation (Non-Medical):    Physical Activity:    . Days of Exercise per Week:    . Minutes of Exercise per Session:    Stress:    . Feeling of Stress :    Social Connections:    . Frequency of Communication with Friends and Family:    . Frequency of Social Gatherings with Friends and Family:    . Attends Religious Services:    . Active Member of Clubs or Organizations:    . Attends Banker Meetings:    Marland Kitchen Marital Status:    Intimate Partner Violence:    . Fear of Current or Ex-Partner:    . Emotionally Abused:    Marland Kitchen Physically Abused:    . Sexually Abused:        Allergies     Allergies   Allergen Reactions   . Latex Rash       Home Medications  Prior to Admission medications    Medication Sig Start Date End Date Taking? Authorizing Provider   ALPRAZolam (XANAX) 0.5 MG tablet Take 0.5 mg by mouth nightly as needed    [provider]   Ascorbic Acid (VITAMIN C PO) Take by mouth    [provider]   DENTA 5000 PLUS 1.1 % Cream BRUSH FOR 2 MINS TWICE DAILY.EXPECTORATE, DO NOT RINSE. NO EAT/DRINK X30MINS 04/05/18   Vial, Grover Canavan, MD   losartan-hydrochlorothiazide (HYZAAR) 100-12.5 MG per tablet TAKE 1 TABLET BY MOUTH EVERY DAY 02/23/20   [name], MD   vitamin D (CHOLECALCIFEROL) 25 MCG (1000 UT) tablet Take 1,000 Units by mouth daily    [provider]   VITAMIN E PO Take by mouth    [provider]         Review of Systems     Review of Systems   Gastrointestinal: Positive for abdominal pain, diarrhea and nausea.   All other systems reviewed and are negative.        Physical Exam     Patient Vitals for the past 24 hrs:   BP Temp Temp src Pulse Resp SpO2 Weight   03/21/20 1602 141/85 97.9 F (36.6 C) Oral 93 16 99 % --   03/21/20 1222 (!) 145/98 98.4 F (36.9 C) Oral 93 16 98 % --   03/21/20 1220 -- -- -- -- 18 -- 83.6 kg       Physical Exam  Vitals and nursing note reviewed.   Constitutional:       Comments: Cooperative female laying in Doctor, general practice.  She appears generally somewhat uncomfortable.  Rubbing her abdomen diffusely at times   HENT:      Head: Normocephalic and atraumatic.   Cardiovascular:      Rate and Rhythm: Normal rate and regular rhythm.      Heart sounds: S1 normal and S2 normal.   Pulmonary:      Effort: Pulmonary effort is normal.      Breath sounds: Normal breath sounds.   Abdominal:      General: There is distension.      Palpations: Abdomen is soft.      Tenderness: There is generalized abdominal tenderness.      Comments: Mild generalized tenderness   Musculoskeletal:         General: Normal range of motion.   Skin:     General: Skin is warm and dry.   Neurological:      Mental Status: She is alert.   Psychiatric:         Behavior: Behavior is cooperative.           Orders Placed During This Encounter     Orders Placed This Encounter   Procedures   . CT Abd/ Pelvis with IV Contrast   . CBC and differential   . Comprehensive metabolic panel   . C Reactive Protein   . Lipase   . UA Reflex to Micro - Reflex to Culture   . GFR   . Manual Differential   . Cell MorpHology   . Saline lock IV       ED Medications Administered     ED Medication Orders (From admission, onward)    Start Ordered     Status Ordering  Provider    03/21/20 1527 03/21/20 1528  iohexol (OMNIPAQUE) 350 MG/ML injection 100 mL  IMG once as needed     Route: Intravenous  Ordered  Dose: 100 mL     Last MAR action: Imaging Agent Given Forest Gleason    03/21/20 1446 03/21/20 1445  sodium chloride 0.9 % bolus 1,000 mL  Once     Route: Intravenous  Ordered Dose: 1,000 mL     Last MAR action: Stopped Forest Gleason    03/21/20 1312 03/21/20 1311  sodium chloride 0.9 % bolus 1,000 mL  Once     Route: Intravenous  Ordered Dose: 1,000 mL     Last MAR action: Stopped Shalana Jardin C    03/21/20 1312 03/21/20 1311  ondansetron (ZOFRAN) injection 4 mg  Once     Route: Intravenous  Ordered Dose: 4 mg     Last MAR action: Given Izak Anding C    03/21/20 1312 03/21/20 1311  morphine injection 4 mg  Once     Route: Intravenous  Ordered Dose: 4 mg     Last MAR action: Given Zarielle Cea C    03/21/20 1312 03/21/20 1311  ketorolac (TORADOL) injection 15 mg  Once     Route: Intravenous  Ordered Dose: 15 mg     Last MAR action: Given Erving Sassano C          Medical Decision Making     Nurses notes including past history, allergies, vital signs reviewed.  Patient's home medications reviewed      BP 141/85   Pulse 93   Temp 97.9 F (36.6 C) (Oral)   Resp 16   Wt 83.6 kg   SpO2 99%   BMI 34.35 kg/m     O2 Sat in ED is 98% which is within normal limits.  Pt is being observed on intermittent pulsox    Patient with abdominal pain.  Could be self-limited GI illness.  Colitis is also possible.  Less likely appendicitis though no previous surgeries.  Unlikely obstruction with presence of diarrhea no bowel movement recently.  Plan to check labs, urine, and CT of the abdomen/pelvis.  Support with IV fluid, antiemetic, and analgesia.  Patient drove herself to the emergency department but plans to get a ride home.    Patient Mariesha Venturella was seen and treated by me in the ED  Forest Gleason, M.D.  03/21/2020 1:11 PM    EKG shows: na    Diagnostic Study Results  and Data Review     The results of the diagnostic studies below were reviewed by the ED provider:    Labs  Results     Procedure Component Value Units Date/Time    Cell MorpHology [[id number]] Collected: 03/21/20 1355     Updated: 03/21/20 1436     Cell Morphology Normal     Platelet Estimate Normal    CBC and differential [[id number]]  (Abnormal) Collected: 03/21/20 1355    Specimen: Blood Updated: 03/21/20 1436     WBC 7.13 x10 3/uL      Hgb 14.9 g/dL      Hematocrit 78.2 %      Platelets 274 x10 3/uL      RBC 4.92 x10 6/uL      MCV 93.1 fL      MCH 30.3 pg      MCHC 32.5 g/dL      RDW 13 %      MPV 11.5 fL      Nucleated RBC 0.0 /100 WBC      Absolute NRBC 0.00 x10 3/uL     Manual Differential [[id number]]  (Abnormal) Collected:  03/21/20 1355     Updated: 03/21/20 1436     Segmented Neutrophils 43 %      Band Neutrophils 0 %      Lymphocytes Manual 47 %      Monocytes Manual 8 %      Eosinophils Manual 2 %      Basophils Manual 0 %      Neutrophils Absolute Manual 3.07 x10 3/uL      Band Neutrophils Absolute 0.00 x10 3/uL      Lymphocytes Absolute Manual 3.35 x10 3/uL      Monocytes Absolute 0.57 x10 3/uL      Eosinophils Absolute Manual 0.14 x10 3/uL      Basophils Absolute Manual 0.00 x10 3/uL     C Reactive Protein [[id number]] Collected: 03/21/20 1355    Specimen: Blood Updated: 03/21/20 1432     C-Reactive Protein <0.1 mg/dL     Lipase [[id number]] Collected: 03/21/20 1355    Specimen: Blood Updated: 03/21/20 1432     Lipase 18 U/L     GFR [[id number]] Collected: 03/21/20 1355     Updated: 03/21/20 1432     EGFR >60.0    Comprehensive metabolic panel [[id number]]  (Abnormal) Collected: 03/21/20 1355    Specimen: Blood Updated: 03/21/20 1432     Glucose 89 mg/dL      BUN 8 mg/dL      Creatinine 0.9 mg/dL      Sodium 629 mEq/L      Potassium 4.4 mEq/L      Chloride 106 mEq/L      CO2 24 mEq/L      Calcium 10.6 mg/dL      Protein, Total 7.6 g/dL      Albumin 4.0 g/dL      AST (SGOT) 33 U/L      ALT 46 U/L       Alkaline Phosphatase 89 U/L      Bilirubin, Total 0.5 mg/dL      Globulin 3.6 g/dL      Albumin/Globulin Ratio 1.1     Anion Gap 11.0    UA Reflex to Micro - Reflex to Culture [[id number]] Collected: 03/21/20 1355     Updated: 03/21/20 1409     Urine Type Urine, Clean Ca     Color, UA Yellow     Clarity, UA Clear     Specific Gravity UA 1.010     Urine pH 6.0     Leukocyte Esterase, UA Negative     Nitrite, UA Negative     Protein, UR Negative     Glucose, UA Negative     Ketones UA Negative     Urobilinogen, UA Normal mg/dL      Bilirubin, UA Negative     Blood, UA Negative          Radiologic Studies  Radiology Results (24 Hour)     Procedure Component Value Units Date/Time    CT Abd/ Pelvis with IV Contrast [[id number]] Collected: 03/21/20 1556    Order Status: Completed Updated: 03/21/20 1601    Narrative:      HISTORY: Abdominal pain    COMPARISON: 06/02/2013    TECHNIQUE: Helical CT was performed of abdomen and pelvis following the  administration of IV and oral contrast.  Patient was administered 100 cc   Omnipaque 350 non-ionic IV contrast.    CT ABDOMEN AND PELVIS WITH CONTRAST:   Lung bases are clear. Small hiatal hernia is seen. Appendix is  visualized  and appears normal. Diverticulosis is seen in sigmoid colon.  No abscess is seen. There are small fat-containing bilateral inguinal  hernia. Tiny subcentimeter cyst is seen in right kidney. No focal mass  is seen in the liver, spleen, pancreas, gallbladder, adrenals, or the  kidneys bilaterally. There is no adenopathy.  Visualized loops of bowel  demonstrate no obstruction. There is no pelvic mass or adenopathy.   Bladder and uterus are otherwise unremarkable. No focal bony mass is  seen.      Impression:           Diverticulosis of sigmoid colon. No abscess. Otherwise no acute  abnormality seen in abdomen and pelvis.    Kristine Linea, MD   03/21/2020 3:59 PM            Clinical Course       4:32 PM  Patient remained stable.  She looks more comfortable.   Advised her that testing was reassuring.  She looked somewhat dehydrated on her blood work with some hemoconcentration.  White blood count normal suggesting against severe infection.  CT not showing any acute process.  Likely self-limited GI illness this, potentially viral gastroenteritis.  Patient feels comfortable with discharge with Zofran.  She will use over-the-counter pain medication.  She has a primary care with home to follow-up next week if she continues to have issues    Procedures       Procedures      Critical Care       na    Prescriptions       Discharge Medication List as of 03/21/2020  4:31 PM      START taking these medications    Details   ondansetron (ZOFRAN-ODT) 4 MG disintegrating tablet Take 1 tablet (4 mg total) by mouth every 6 (six) hours as needed for Nausea, Starting Sun 03/21/2020, E-Rx             Diagnosis and Disposition     Clinical Impression:  1. Nausea    2. Abdominal pain, unspecified abdominal location    3. Diarrhea of presumed infectious origin      Final diagnoses:   Nausea   Abdominal pain, unspecified abdominal location   Diarrhea of presumed infectious origin       Disposition:  ED Disposition     ED Disposition Condition Date/Time Comment    Discharge  Sun Mar 21, 2020  4:31 PM [name] discharge to home/self care.    Condition at disposition: Stable            This note was generated by the [facility] EMR system/Dragon speech recognition and may contain inherent errors or omissions not intended by the user. Grammatical errors, random word insertions, deletions, pronoun errors and incomplete sentences are occasional consequences of this technology due to software limitations. Not all errors are caught or corrected. If there are questions or concerns about the content of this note or information contained within the body of this dictation they should be addressed directly with the author for clarification.           Forest Gleason, MD  03/21/20 [phone]

## 2020-03-21 NOTE — ED Triage Notes (Signed)
abd pain diffuse onset Friday, sent here after seen at [facility]. Slight diarrhea

## 2020-03-24 ENCOUNTER — Telehealth (INDEPENDENT_AMBULATORY_CARE_PROVIDER_SITE_OTHER): Payer: Self-pay | Admitting: Internal Medicine

## 2020-03-24 NOTE — Telephone Encounter (Signed)
Attempted to call patient for ED follow up visit. LMTCB.

## 2020-03-25 NOTE — Progress Notes (Signed)
Chief Complaint   Patient presents with   . Abdominal Pain     Discharge pt, f/u     64 y.o.  female presents for f/u of chronic conditions including:  Also seeing me after ER visit 7/4 for abd pain.  CT abd/pelvis w/diverticulosis, o/w nl.  Given bentyl? Nausea since last weekend.  Sometimes stomach cramp in middle of night & had diarrhea x 1.  Sometimes abd soreness.  At this point, not nauseated like she was but not back to normal.  Trying to keep diet bland.       Per ER:  [name] is a 64 y.o. female with no previous abdominal surgery, previous history of hepatitis C that was treated by GI who started on Friday with abdominal cramping and discomfort.  She had a little bit of diarrhea.  No significant bowel movement otherwise.  Nausea but no vomiting.  Pain has been persistent and getting worse.  She went to urgent care and was referred to the emergency department for CT scan.  Patient with abdominal pain.  Could be self-limited GI illness.  Colitis is also possible.  Less likely appendicitis though no previous surgeries.  Unlikely obstruction with presence of diarrhea no bowel movement recently.  Plan to check labs, urine, and CT of the abdomen/pelvis.  Support with IV fluid, antiemetic, and analgesia.  Patient drove herself to the emergency department but plans to get a ride home.    BP 127/86 (BP Site: Right arm, Patient Position: Sitting, Cuff Size: Medium)   Pulse 99   Temp 98.7 F (37.1 C) (Oral)   Ht 1.524 m (5')   Wt 83.9 kg (185 lb)   BMI 36.13 kg/m     PROBLEM LIST:  Gastroenteritis--resolving; may try prilosec otc x 2 weeks then prn.  Consider probiotic.  Suspect just viral.    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 Tried prozac (2 wks); doing therapy (sister died 2022-12-10).    HTN--on losartan-hctz 100-12.5mg ; compliant with meds; no se's.  Good control at  home generally.  Start checking again.  BP Readings from Last 3 Encounters:   03/26/20 127/86   03/21/20 141/85   07/01/19 131/86   Lumbar disc disease with chronic pain--on tramadol 50mg  q8 prn, gabapentin 100mg  tid; had taken vicodin, percocet prior; sees pain management.    OA--bilat knees, hips, back (LLE sciatica)  Smoker--discussed that she could retry chantix if she wants but not ready right now.  Allergic rhinitis--on antihistamine prn; no side effects.  Good control   Chronic insomnia--on trazodone 50mg  only prn; tried ambien (headache).  Works well.   Osteopenia--bmd 12/10/22 Tmin -2.2, frax not done  Eczema--on betamethasone cream prn  Carpal tunnel--bilat  Diverticulosis--on ct 7/21  H/o Colon polyps--'15; rpt 30yrs  H/o L knee replacement  Obesity--Body mass index is 36.13 kg/m. Discussed working on diet/exercise.  Wt Readings from Last 3 Encounters:   03/26/20 83.9 kg (185 lb)   03/21/20 83.6 kg (184 lb 4.9 oz)   07/01/19 83 kg (183 lb)     SOCIAL HISTORY: Works with mentally handicapped.  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  Smoker (1/4ppd x 30+yrs), no etoh    HEALTH MAINTENANCE:  Shingrix: discussed  Prevnar:  Pneumovax:  EKG: 6/18  ASCVD Risk (03/26/2020):  CT chest: 12/05/17 nl  ASA:  BMD:   Ca/VitD:    Immunization History   Administered Date(s) Administered   . COVID-19 mRNA Vaccine Preservative Free 0.5 mL (MODERNA) 11/16/2019, 12/13/2019   . Influenza (Im) Preserved TRIVALENT VACCINE 06/30/2014   . Influenza Vacc QUAD Recombinant PF 53yrs & up 07/09/2018, 07/01/2019   . Influenza quadrivalent (IM) 6 months & up PRESERVED 07/15/2015   . Influenza quadrivalent (IM) PF 3 Yrs & greater 07/31/2016, 07/23/2017   . Tdap 12/17/2008, 06/30/2014      Health Maintenance Due   Topic Date Due   . Advance Directive on File  Never done   . [facility] CARE PLAN LETTER  Never done   . Shingrix Vaccine 50+ (1) Never done   . HIGH RISK PNEUMONIA  Never done   . DXA Scan  12/06/2019    . PAP SMEAR  01/02/2020     Allergies   Allergen Reactions   . Latex Rash     Lab Results   Component Value Date    WBC 7.13 03/21/2020    HGB 14.9 (H) 03/21/2020    HCT 45.8 (H) 03/21/2020    PLT 274 03/21/2020    CHOL 192 07/01/2019    TRIG 104 07/01/2019    HDL 45 07/01/2019    LDL 126 (H) 07/01/2019    ALT 46 03/21/2020    AST 33 03/21/2020    NA 141 03/21/2020    K 4.4 03/21/2020    CL 106 03/21/2020    CREAT 0.9 03/21/2020    BUN 8 03/21/2020    CO2 24 03/21/2020    TSH 0.80 07/01/2019    INR 1.0 02/22/2017    GLU 89 03/21/2020    HGBA1C 5.5 07/01/2019     Lab Results   Component Value Date    VITD 66 07/01/2019     Review of Systems   Constitutional: Negative for fever and unexpected weight change.   HENT: Negative for congestion and postnasal drip.    Respiratory: Negative for cough, shortness of breath and wheezing.    Cardiovascular: Negative for chest pain and leg swelling.   Gastrointestinal: Negative for abdominal pain, constipation, diarrhea, nausea and vomiting.   Genitourinary: Negative for dysuria and frequency.   Psychiatric/Behavioral: Negative for dysphoric mood and sleep disturbance.     Physical Exam  Constitutional:       General: She is not in acute distress.  Eyes:      Conjunctiva/sclera: Conjunctivae normal.   Neck:      Thyroid: No thyromegaly.   Cardiovascular:      Rate and Rhythm: Normal rate and regular rhythm.      Heart sounds: Normal heart sounds. No murmur heard.   No friction rub. No gallop.    Pulmonary:      Effort: Pulmonary effort is normal.      Breath sounds: Normal breath sounds. No wheezing or rales.   Abdominal:      General: Bowel sounds are normal. There is no distension.      Palpations: Abdomen is soft. There is no mass.      Tenderness: There is no abdominal tenderness.   Musculoskeletal:      Cervical back: Normal range of motion.   Lymphadenopathy:      Cervical: No cervical adenopathy.   Skin:     Findings: No rash.   Neurological:      Mental Status: She is  alert and oriented to person, place, and time.   Psychiatric:  Behavior: Behavior normal.         HIgh BMI Follow Up   BMI Follow Up Care Plan Documented    ASSESSMENT/PLAN:  (See above for details)  [name] was seen today for abdominal pain.    Diagnoses and all orders for this visit:    Gastroenteritis    Osteopenia, unspecified location  -     Dxa Bone Density Axial Skeleton    Essential hypertension  -     losartan-hydrochlorothiazide (HYZAAR) 100-12.5 MG per tablet; Take 1 tablet by mouth daily    Smoker        Continue current meds for stable conditions  Symptomatic treatment reviewed in detail    Risk & Benefits of any new medication(s) were explained to the patient who verbalized understanding & agreed to the treatment plan.     Call if symptoms persist, worsen, or change.  Call with updates/questions/concerns.

## 2020-03-26 ENCOUNTER — Encounter (INDEPENDENT_AMBULATORY_CARE_PROVIDER_SITE_OTHER): Payer: Self-pay | Admitting: Internal Medicine

## 2020-03-26 ENCOUNTER — Ambulatory Visit (INDEPENDENT_AMBULATORY_CARE_PROVIDER_SITE_OTHER): Payer: No Typology Code available for payment source | Admitting: Internal Medicine

## 2020-03-26 VITALS — BP 127/86 | HR 99 | Temp 98.7°F | Ht 60.0 in | Wt 185.0 lb

## 2020-03-26 DIAGNOSIS — I10 Essential (primary) hypertension: Secondary | ICD-10-CM

## 2020-03-26 DIAGNOSIS — M8580 Other specified disorders of bone density and structure, unspecified site: Secondary | ICD-10-CM

## 2020-03-26 DIAGNOSIS — F17200 Nicotine dependence, unspecified, uncomplicated: Secondary | ICD-10-CM

## 2020-03-26 DIAGNOSIS — K529 Noninfective gastroenteritis and colitis, unspecified: Secondary | ICD-10-CM

## 2020-03-26 MED ORDER — LOSARTAN POTASSIUM-HCTZ 100-12.5 MG PO TABS
1.0000 | ORAL_TABLET | Freq: Every day | ORAL | 3 refills | Status: DC
Start: 2020-03-26 — End: 2021-05-09

## 2020-06-25 ENCOUNTER — Encounter (INDEPENDENT_AMBULATORY_CARE_PROVIDER_SITE_OTHER): Payer: Self-pay

## 2020-06-26 ENCOUNTER — Encounter (INDEPENDENT_AMBULATORY_CARE_PROVIDER_SITE_OTHER): Payer: Self-pay

## 2020-07-26 ENCOUNTER — Encounter (INDEPENDENT_AMBULATORY_CARE_PROVIDER_SITE_OTHER): Payer: Self-pay

## 2020-07-27 ENCOUNTER — Encounter (INDEPENDENT_AMBULATORY_CARE_PROVIDER_SITE_OTHER): Payer: Self-pay

## 2020-08-25 ENCOUNTER — Encounter (INDEPENDENT_AMBULATORY_CARE_PROVIDER_SITE_OTHER): Payer: Self-pay

## 2020-08-26 ENCOUNTER — Encounter (INDEPENDENT_AMBULATORY_CARE_PROVIDER_SITE_OTHER): Payer: Self-pay

## 2020-09-14 ENCOUNTER — Ambulatory Visit (FREE_STANDING_LABORATORY_FACILITY): Payer: No Typology Code available for payment source | Admitting: Family Medicine

## 2020-09-14 ENCOUNTER — Encounter (INDEPENDENT_AMBULATORY_CARE_PROVIDER_SITE_OTHER): Payer: Self-pay | Admitting: Family Medicine

## 2020-09-14 VITALS — BP 143/88 | HR 109 | Temp 98.8°F | Resp 20 | Ht 60.0 in | Wt 185.0 lb

## 2020-09-14 DIAGNOSIS — J069 Acute upper respiratory infection, unspecified: Secondary | ICD-10-CM

## 2020-09-14 DIAGNOSIS — Z20822 Contact with and (suspected) exposure to covid-19: Secondary | ICD-10-CM

## 2020-09-14 MED ORDER — OSELTAMIVIR PHOSPHATE 75 MG PO CAPS
75.00 mg | ORAL_CAPSULE | Freq: Two times a day (BID) | ORAL | 0 refills | Status: AC
Start: 2020-09-14 — End: 2020-09-19

## 2020-09-14 NOTE — Progress Notes (Signed)
[facility]  PROGRESS NOTE     Patient: [name]   Date: 09/14/2020   MRN: [id number]       [name] is a 64 y.o. female      HISTORY     History obtained from: Patient    Chief Complaint   Patient presents with   . Cough     Symptom onset yesterday. Headache, fatigue, cough, sore throat, body aches, pain from coughing. Unsure of fever. Covid vax x2 (March 2021); no flu shot.         Works c elderly clients  + tob  Patient presents with:  Cough: Symptom onset yesterday. Headache, fatigue, cough, sore throat, body aches, pain from coughing. Unsure of fever. Covid vax x2 (March 2021); no flu shot.     All started yesterday suddenly  See ROS           Review of Systems   Constitutional: Positive for chills and fatigue.   HENT: Positive for sore throat.    Respiratory: Positive for cough.    Musculoskeletal: Positive for myalgias.   Neurological: Positive for headaches.       History:    Pertinent Past Medical, Surgical, Family and Social History were reviewed.        Current Outpatient Medications:   .  Ascorbic Acid (VITAMIN C PO), Take by mouth, Disp: , Rfl:   .  losartan-hydrochlorothiazide (HYZAAR) 100-12.5 MG per tablet, Take 1 tablet by mouth daily, Disp: 90 tablet, Rfl: 3  .  Multiple Vitamins-Minerals (CENTRUM ADULTS PO), Take by mouth, Disp: , Rfl:   .  vitamin D (CHOLECALCIFEROL) 25 MCG (1000 UT) tablet, Take 1,000 Units by mouth daily, Disp: , Rfl:   .  VITAMIN E PO, Take by mouth, Disp: , Rfl:   .  oseltamivir (TAMIFLU) 75 MG capsule, Take 1 capsule (75 mg total) by mouth 2 (two) times daily for 5 days, Disp: 10 capsule, Rfl: 0    Allergies   Allergen Reactions   . Latex Rash       Medications and Allergies reviewed.    PHYSICAL EXAM     Vitals:    09/14/20 1041   BP: 143/88   Pulse: (!) 109   Resp: 20   Temp: 98.8 F (37.1 C)   TempSrc: Tympanic   SpO2: 98%   Weight: 83.9 kg (185 lb)   Height: 1.524 m (5')       Physical Exam  Constitutional:       General: She is not in acute  distress.     Appearance: She is ill-appearing.   HENT:      Mouth/Throat:      Pharynx: Posterior oropharyngeal erythema present.   Cardiovascular:      Rate and Rhythm: Tachycardia present.      Pulses: Normal pulses.      Heart sounds: Normal heart sounds.   Pulmonary:      Effort: Pulmonary effort is normal.      Breath sounds: Normal breath sounds.   Neurological:      Mental Status: She is alert.   Vitals reviewed.         UCC COURSE     There were no labs reviewed with this patient during the visit.      There were no x-rays reviewed with this patient during the visit.    No current facility-administered medications for this visit.       PROCEDURES  Procedures    MEDICAL DECISION MAKING     History, physical, labs/studies most consistent with viral uri as the diagnosis.    Chart Review:  Prior PCP, Specialist and/or ED notes reviewed today: No  Prior labs/images/studies reviewed today: No    Differential Diagnosis: covid, flu, viral uri      ASSESSMENT     Encounter Diagnoses   Name Primary?   . Suspected COVID-19 virus infection Yes   . Viral URI with cough             PLAN      PLAN: tx for flu as high risk  Pt under plan and fu       Orders Placed This Encounter   Procedures   . COVID-19 (SARS-CoV-2)     Requested Prescriptions     Signed Prescriptions Disp Refills   . oseltamivir (TAMIFLU) 75 MG capsule 10 capsule 0     Sig: Take 1 capsule (75 mg total) by mouth 2 (two) times daily for 5 days       Discussed results and diagnosis with patient/family.  Reviewed warning signs for worsening condition, as well as, indications for follow-up with primary care physician and return to urgent care clinic.   Patient/family expressed understanding of instructions.     An After Visit Summary was provided to the patient.

## 2020-09-14 NOTE — Patient Instructions (Signed)
Start Tamiflu today but if COVID + can stop    Make sure to check your COVID results tomorrow    Tylenol 2 extra strength every 6 hrs and lots of fluids, warm with honey is best    If not improving let us know    If COVID + no return to work, etc until feel better and > than 5 daysFollow-up Steps for Patients with Pending COVID-19 Testing - 04/22/2020     Thank you for choosing [facility]. During your visit you received a test for COVID-19 and the test performed takes at least 1-3 days to result.      What should I do while I am waiting for my test results?   . Take good care of yourself and be mindful of the safety of those around you:   . Rest and stay well hydrated.   . Use acetaminophen (Tylenol) to help manage fever.   [name] your hands often.   . Stay at home except to get medical care.   . Call ahead before seeing your doctor, any other medical provider, or seeking medical care at any healthcare facility.   . Isolate yourself as much as possible.   . Clean all "high touch" surfaces every day.   . Most people with COVID-19 infection will get better; however, a small number will develop worsening infection which may require more intensive treatment. Please contact your doctor or present to the nearest Emergency Department if you 6develop the warning signs of more severe infection such as:   o A fever over 102 not controlled by acetaminophen (Tylenol)   o Shortness of breath or chest pain   o Worsening cough   o Increasing weakness   o Inability to tolerate oral fluids   . Some general things to think about:   o If you have a medical emergency and need to call 911, notify the person answering the phone that you have, or are being evaluated for COVID-19. If possible, put on a facemask before the ambulance arrives.   o Persons who are placed under active monitoring or facilitated self-monitoring should follow instructions provided by their local health department or occupational health professionals,  as appropriate    When will I know the results of my COVID test?     After a minimum of 1-3 days has passed, there are multiple ways in which you can be informed about the results of your COVID 19 test results. Please ensure we have your most up to date contact information on file before you leave your appointment.     MyChart - you may be notified of results through your MyChart account. The easiest way to find all test results done at an [facility] facility is through your MyChart account, [facility] online patient portal. This is also an easy way to connect with your  primary care team for continued care, if this applies to you. If you do not already have   MyChart activated, you will be given an activation code at the time of testing.     You may receive a call from your Primary Care Physician or the physician who ordered the test with the results. You may also receive a call from [facility].         If you need help activating your MyChart account, please let us know. You can also download the MyChart application to your phone using the QR code below.       When  can I stop isolation?    Until you receive your results, you should remain home and avoid contact with others (self-isolate).The decision to stop isolation precautions should be made on a case-by-case basis, in consultation with healthcare providers and state and local health departments.     Here is some general advice:      If your test result is negative, you should stay home until you do not have a fever (without the use of fever reducing medication) or any respiratory symptoms for at least 3 days.      If your test result is positive, you should stay home for at least 10 days from the day your symptoms started. If you are still having symptoms at the end of 10 days, continue in-home isolation until 3 days after your symptoms stop. Talk to your healthcare provider to determine what follow-up is needed. You can also schedule telemedicine visits with your  primary care physician to discuss ongoing symptoms or new concerns that may arise.     For further information on what you and others in your household should do please visit the Center for Disease Control (CDC) website @ [employer]si.html       In addition, all [facility] can care for patients with all conditions, both COVID and non-COVID. Do not hesitate to seek care should you have a healthcare emergency.

## 2020-09-15 LAB — COVID-19 (SARS-COV-2): SARS CoV 2 Overall Result: DETECTED — AB

## 2020-09-25 ENCOUNTER — Encounter (INDEPENDENT_AMBULATORY_CARE_PROVIDER_SITE_OTHER): Payer: Self-pay

## 2020-09-26 ENCOUNTER — Encounter (INDEPENDENT_AMBULATORY_CARE_PROVIDER_SITE_OTHER): Payer: Self-pay

## 2020-10-26 ENCOUNTER — Encounter (INDEPENDENT_AMBULATORY_CARE_PROVIDER_SITE_OTHER): Payer: Self-pay

## 2020-10-27 ENCOUNTER — Encounter (INDEPENDENT_AMBULATORY_CARE_PROVIDER_SITE_OTHER): Payer: Self-pay

## 2020-11-24 ENCOUNTER — Encounter (INDEPENDENT_AMBULATORY_CARE_PROVIDER_SITE_OTHER): Payer: Self-pay

## 2020-12-25 ENCOUNTER — Emergency Department: Payer: 59

## 2020-12-25 ENCOUNTER — Other Ambulatory Visit: Payer: Self-pay

## 2020-12-25 ENCOUNTER — Encounter: Payer: Self-pay | Admitting: Radiology

## 2020-12-25 ENCOUNTER — Emergency Department
Admission: EM | Admit: 2020-12-25 | Discharge: 2020-12-25 | Disposition: A | Discharge status: Home / Self Care | Payer: 59 | Attending: Emergency Medicine | Admitting: Emergency Medicine

## 2020-12-25 DIAGNOSIS — K529 Noninfective gastroenteritis and colitis, unspecified: Secondary | ICD-10-CM | POA: Insufficient documentation

## 2020-12-25 DIAGNOSIS — R109 Unspecified abdominal pain: Secondary | ICD-10-CM

## 2020-12-25 DIAGNOSIS — Z91040 Latex allergy status: Secondary | ICD-10-CM | POA: Diagnosis not present

## 2020-12-25 DIAGNOSIS — K625 Hemorrhage of anus and rectum: Secondary | ICD-10-CM | POA: Diagnosis present

## 2020-12-25 LAB — CBC WITH DIFFERENTIAL/PLATELET
Abs Immature Granulocytes: 0.02 K/uL (ref 0.00–0.07)
Basophils Absolute: 0 K/uL (ref 0.0–0.1)
Basophils Relative: 0 %
Eosinophils Absolute: 0.3 K/uL (ref 0.0–0.5)
Eosinophils Relative: 3 %
HCT: 38.9 % (ref 36.0–46.0)
Hemoglobin: 12.7 g/dL (ref 12.0–15.0)
Immature Granulocytes: 0 %
Lymphocytes Relative: 19 %
Lymphs Abs: 1.7 K/uL (ref 0.7–4.0)
MCH: 30.4 pg (ref 26.0–34.0)
MCHC: 32.6 g/dL (ref 30.0–36.0)
MCV: 93.1 fL (ref 80.0–100.0)
Monocytes Absolute: 0.7 K/uL (ref 0.1–1.0)
Monocytes Relative: 8 %
Neutro Abs: 6.4 K/uL (ref 1.7–7.7)
Neutrophils Relative %: 70 %
Platelets: 215 K/uL (ref 150–400)
RBC: 4.18 MIL/uL (ref 3.87–5.11)
RDW: 13.2 % (ref 11.5–15.5)
Smear Review: NORMAL
WBC: 9.1 K/uL (ref 4.0–10.5)
nRBC: 0 % (ref 0.0–0.2)

## 2020-12-25 LAB — COMPREHENSIVE METABOLIC PANEL WITH GFR
ALT: 36 U/L (ref 0–44)
AST: 43 U/L — ABNORMAL HIGH (ref 15–41)
Albumin: 3.7 g/dL (ref 3.5–5.0)
Alkaline Phosphatase: 62 U/L (ref 38–126)
Anion gap: 9 (ref 5–15)
BUN: 12 mg/dL (ref 8–23)
CO2: 22 mmol/L (ref 22–32)
Calcium: 9 mg/dL (ref 8.9–10.3)
Chloride: 107 mmol/L (ref 98–111)
Creatinine, Ser: 0.69 mg/dL (ref 0.44–1.00)
GFR, Estimated: >60 mL/min
Glucose, Bld: 96 mg/dL (ref 70–99)
Potassium: 3.6 mmol/L (ref 3.5–5.1)
Sodium: 138 mmol/L (ref 135–145)
Total Bilirubin: 0.6 mg/dL (ref 0.3–1.2)
Total Protein: 7.1 g/dL (ref 6.5–8.1)

## 2020-12-25 LAB — LIPASE, BLOOD: Lipase: 27 U/L (ref 11–51)

## 2020-12-25 IMAGING — CT CT ABD-PELV W/ CM
2 of 5 series · 16 of 46 positions shown, 18 images · IV contrast (APPLIED)
Comparison: None.

CLINICAL DATA: Abdominal pain, rectal bleeding

EXAM:
CT ABDOMEN AND PELVIS WITH CONTRAST
TECHNIQUE: Multidetector CT imaging of the abdomen and pelvis was performed
using the standard protocol following bolus administration of
intravenous contrast.
CONTRAST:  100mL OMNIPAQUE IOHEXOL 300 MG/ML  SOLN

[Series 2: routine abd/pel with · axial · 0.98mm/px · z∈[-914,-489]mm · 13 of 95 slices shown, 15 images]
[im 5/95  soft-tissue]
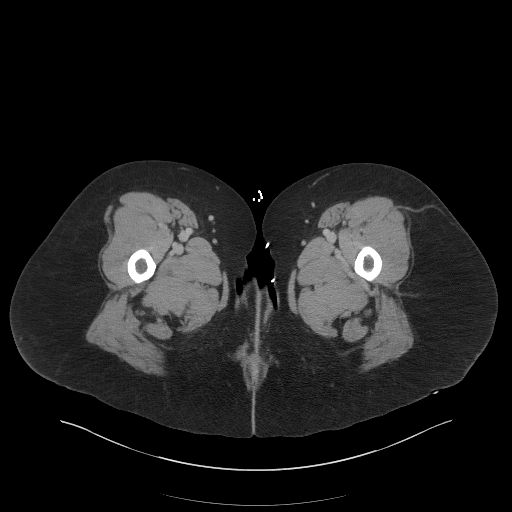
[im 5/95  bone]
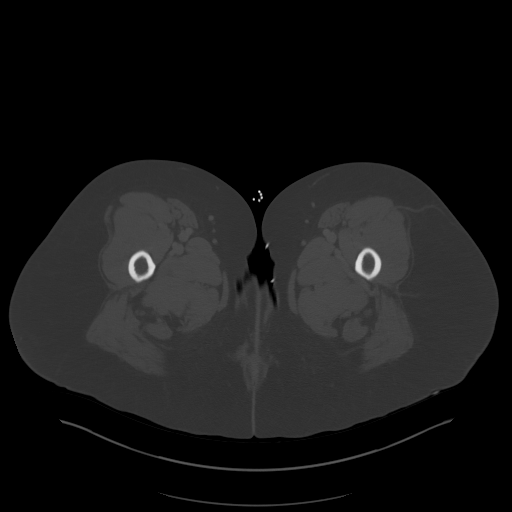
[im 15/95  soft-tissue]
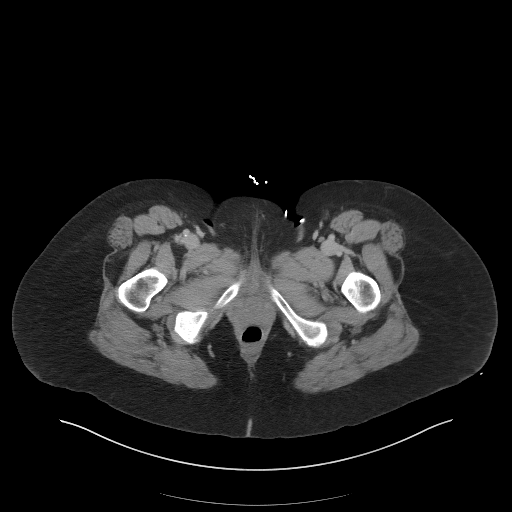
[im 19/95  soft-tissue]
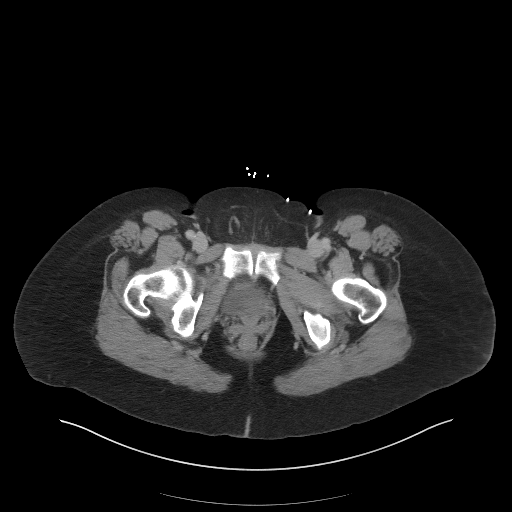
[im 29/95  soft-tissue]
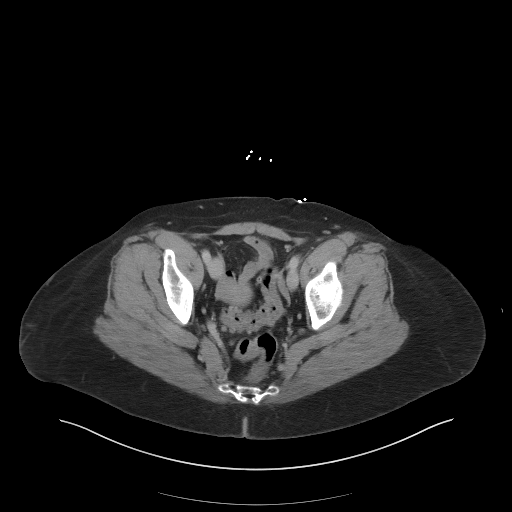
[im 33/95  soft-tissue]
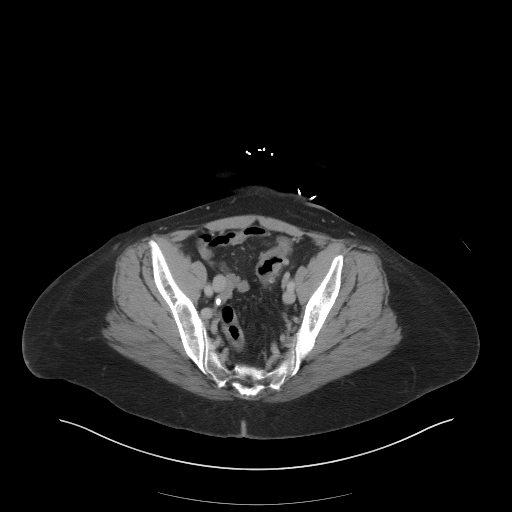
[im 43/95  soft-tissue]
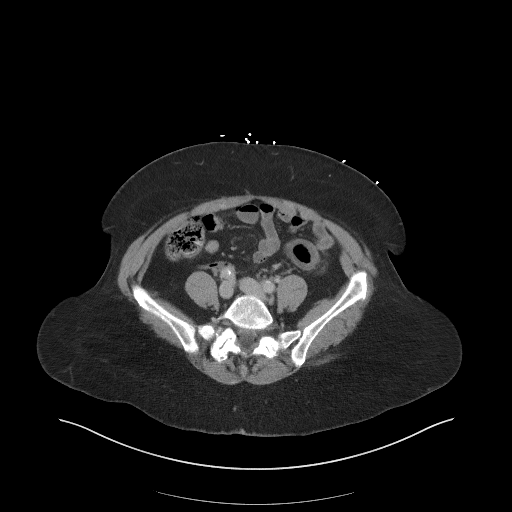
[im 48/95  soft-tissue]
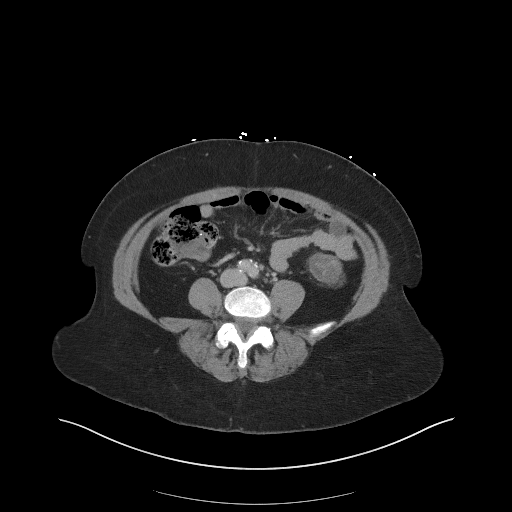
[im 52/95  soft-tissue]
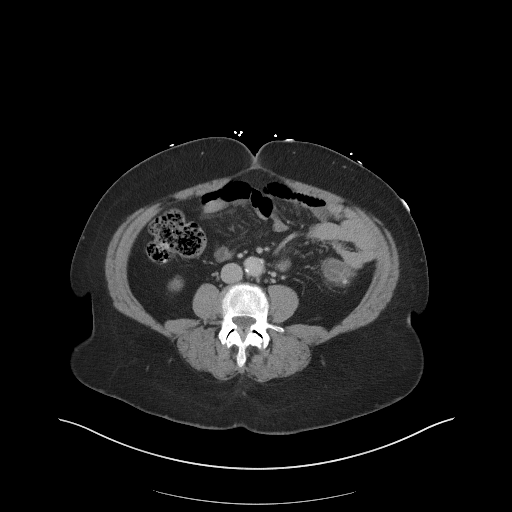
[im 62/95  soft-tissue]
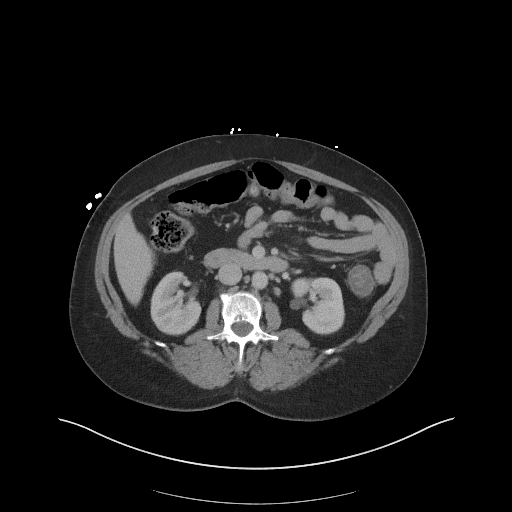
[im 62/95  bone]
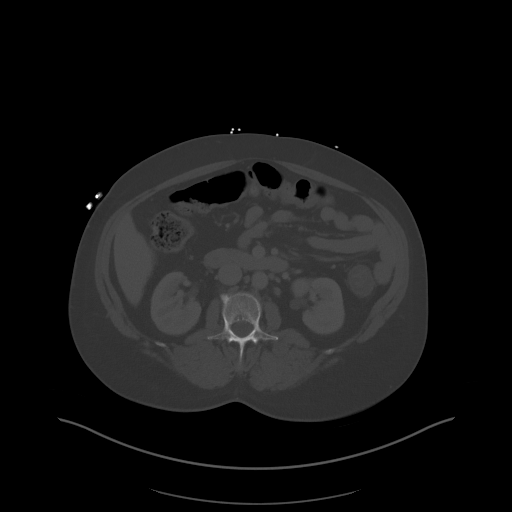
[im 66/95  soft-tissue]
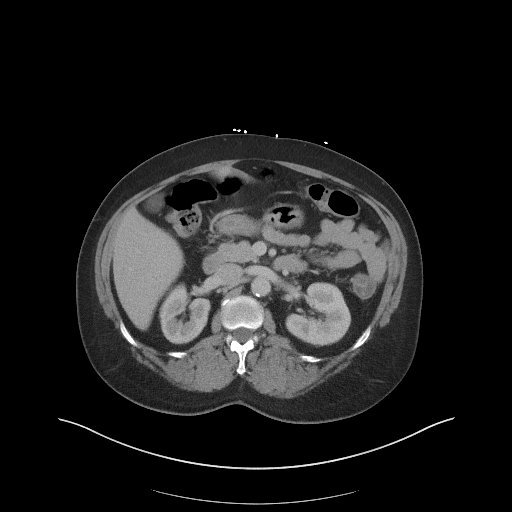
[im 76/95  soft-tissue]
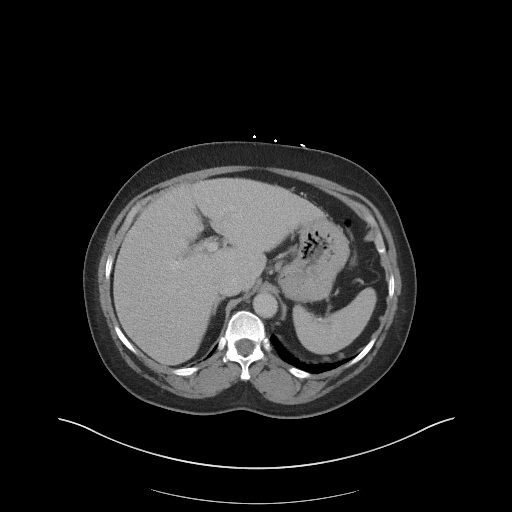
[im 80/95  soft-tissue]
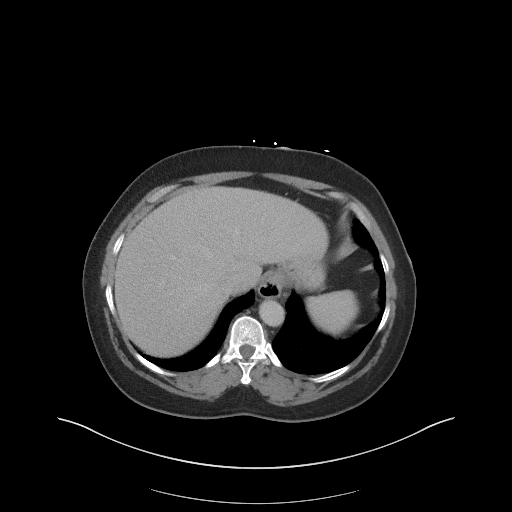
[im 90/95  soft-tissue]
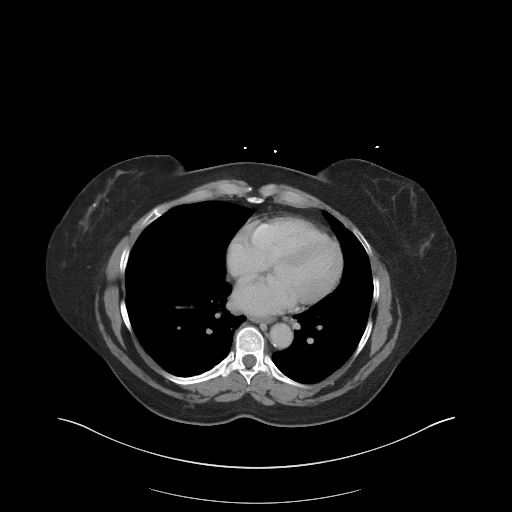

[Series 5: coronal st · coronal · 0.76mm/px · 3 of 100 slices shown]
[im 34/100  soft-tissue]
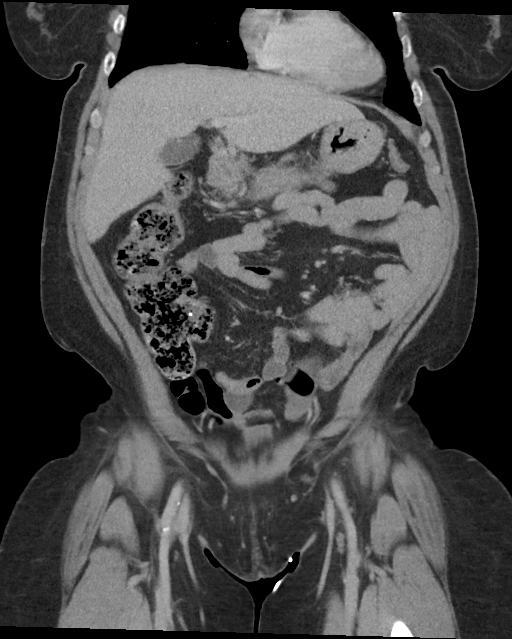
[im 45/100  soft-tissue]
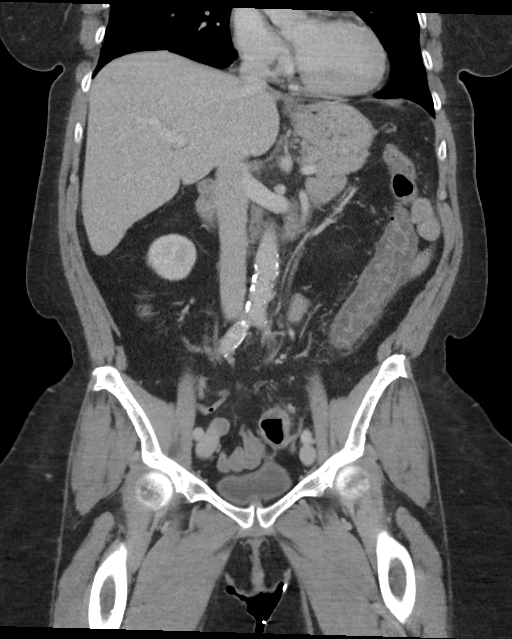
[im 56/100  soft-tissue]
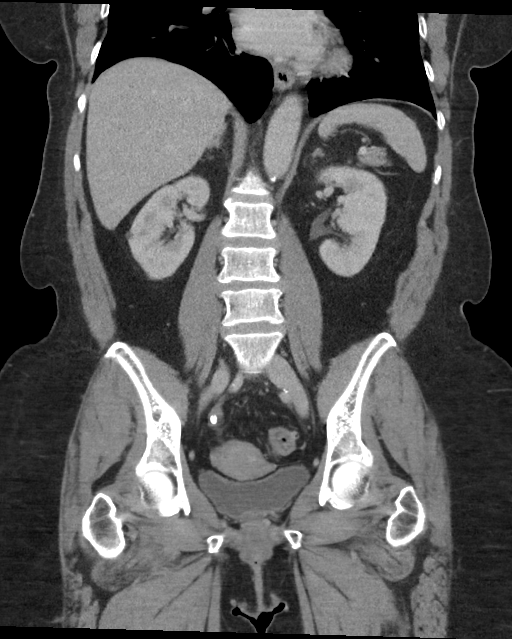

[16 of 46 positions shown; findings below may reference images not displayed]

FINDINGS: Lower chest: No acute abnormality.  Small hiatal hernia.

Hepatobiliary: No solid liver abnormality is seen. No gallstones,
gallbladder wall thickening, or biliary dilatation.

Pancreas: Unremarkable. No pancreatic ductal dilatation or
surrounding inflammatory changes.

Spleen: Normal in size without significant abnormality.

Adrenals/Urinary Tract: Adrenal glands are unremarkable. Kidneys are
normal, without renal calculi, solid lesion, or hydronephrosis.
Bladder is unremarkable.

Stomach/Bowel: Stomach is within normal limits. Appendix appears
normal. There is long segment wall thickening of the descending and
proximal sigmoid colon. There is occasional descending colonic
diverticulosis and sigmoid diverticulosis, which does not appear to
be the nidus of inflammatory findings.

Vascular/Lymphatic: Aortic atherosclerosis. No enlarged abdominal or
pelvic lymph nodes.

Reproductive: No mass or other significant abnormality.

Other: No abdominal wall hernia or abnormality. No abdominopelvic
ascites.

Musculoskeletal: No acute or significant osseous findings.
IMPRESSION: 1. There is long segment wall thickening of the descending and
proximal sigmoid colon. There is occasional descending colonic
diverticulosis and sigmoid diverticulosis, which does not appear to
be the nidus of inflammatory findings. Findings are consistent with
nonspecific infectious, inflammatory, or ischemic colitis.
2. Small hiatal hernia.

Aortic Atherosclerosis ([3Z]-[3Z]).

## 2020-12-25 MED ORDER — HALOPERIDOL LACTATE 5 MG/ML IJ SOLN
5.0000 mg | Freq: Once | INTRAMUSCULAR | Status: AC
  Administered 2020-12-25: 5 mg via INTRAVENOUS
  Filled 2020-12-25: qty 1

## 2020-12-25 MED ORDER — DICYCLOMINE HCL 10 MG PO CAPS: 10.0000 mg | ORAL_CAPSULE | Freq: Three times a day (TID) | ORAL | 0 refills | Status: AC | PRN

## 2020-12-25 MED ORDER — ONDANSETRON HCL 4 MG PO TABS: 4.0000 mg | ORAL_TABLET | Freq: Three times a day (TID) | ORAL | 0 refills | Status: AC | PRN

## 2020-12-25 MED ORDER — SODIUM CHLORIDE 0.9 % IV BOLUS
1000.0000 mL | Freq: Once | INTRAVENOUS | Status: AC
  Administered 2020-12-25: 1000 mL via INTRAVENOUS

## 2020-12-25 MED ORDER — IOHEXOL 300 MG/ML  SOLN
100.0000 mL | Freq: Once | INTRAMUSCULAR | Status: AC | PRN
  Administered 2020-12-25: 100 mL via INTRAVENOUS

## 2020-12-25 NOTE — ED Notes (Signed)
Late entry -- labs collected and kidney function within normal limits-- pt now off the floor to CT dept

## 2020-12-25 NOTE — ED Provider Notes (Signed)
[facility] Emergency Department Provider Note  ____________________________________________   I have reviewed the triage vital signs and the nursing notes.   HISTORY  Chief Complaint Rectal Bleeding and Abdominal Pain   History limited by: Not Limited   HPI Amber Webster is a 65 y.o. female who presents to the emergency department today because of concern for abdominal pain and rectal bleeding. The patient states that the pain started this morning around 1 am. Located primarily in the center of her stomach it did come and go. The patient then noticed around 4 or 5 in the morning she started having rectal bleeding. She would feel the need to go but no stool would come out, just blood. Patient states she has occasionally had pain like this in the past however has never had it this bad nor has it ever been accompanied by blood. She denies any unusual ingestions. Denies any abdominal surgeries.   Records reviewed. Per medical record review patient has a history of gastroenteritis.   Prior to Admission medications   Not on File    Allergies Latex  No family history on file.  Social History    Review of Systems Constitutional: No fever/chills Eyes: No visual changes. ENT: No sore throat. Cardiovascular: Denies chest pain. Respiratory: Denies shortness of breath. Gastrointestinal: Positive for abdominal pain and rectal bleeding.  Genitourinary: Negative for dysuria. Musculoskeletal: Negative for back pain. Skin: Negative for rash. Neurological: Negative for headaches, focal weakness or numbness.  ____________________________________________   PHYSICAL EXAM:  VITAL SIGNS: ED Triage Vitals  Enc Vitals Group     BP 12/25/20 1639 (!) 159/95     Pulse Rate 12/25/20 1639 97     Resp 12/25/20 1639 16     Temp 12/25/20 1639 98.6 F (37 C)     Temp Source 12/25/20 1639 Oral     SpO2 12/25/20 1639 98 %     Weight 12/25/20 1640 190 lb (86.2 kg)      Height --      Head Circumference --      Peak Flow --      Pain Score 12/25/20 1639 10   Constitutional: Alert and oriented.  Eyes: Conjunctivae are normal.  ENT      Head: Normocephalic and atraumatic.      Nose: No congestion/rhinnorhea.      Mouth/Throat: Mucous membranes are moist.      Neck: No stridor. Hematological/Lymphatic/Immunilogical: No cervical lymphadenopathy. Cardiovascular: Normal rate, regular rhythm.  No murmurs, rubs, or gallops.  Respiratory: Normal respiratory effort without tachypnea nor retractions. Breath sounds are clear and equal bilaterally. No wheezes/rales/rhonchi. Gastrointestinal: Soft and tender to palpation in the center and left part of the abdomen.  Genitourinary: Deferred Musculoskeletal: Normal range of motion in all extremities. No lower extremity edema. Neurologic:  Normal speech and language. No gross focal neurologic deficits are appreciated.  Skin:  Skin is warm, dry and intact. No rash noted. Psychiatric: Mood and affect are normal. Speech and behavior are normal. Patient exhibits appropriate insight and judgment.  ____________________________________________    LABS (pertinent positives/negatives)  CMP wnl except ast 43 CBC wbc 9.1, hgb 12.7, plt 215 Lipase 27 ____________________________________________   EKG  None  ____________________________________________    RADIOLOGY  CT abd/pel Long segment wall thickening of descending and proximal sigmoid colon  ____________________________________________   PROCEDURES  Procedures  ____________________________________________   INITIAL IMPRESSION / ASSESSMENT AND PLAN / ED COURSE  Pertinent labs & imaging results that were available during  my care of the patient were reviewed by me and considered in my medical decision making (see chart for details).   Patient presented to the emergency department today because of concern for abdominal pain and rectal bleeding. On exam  patient did have some tenderness to palpation in the center and left part of her stomach. Blood work without concerning leukocytosis, however given location of pain did have some concern for diverticulitis so CT scan was obtained. CT scan does show wall thickening of the descending and proximal sigmoid colon. Not clearly diverticulitis. At this time I do think likely inflammatory/infectious. Have lower concern for ischemia given lack of leukocytosis. Additionally patient did feel improvement after medication. Patient stated she would like to be discharged home after I discussed CT scan results. Will plan on discharging with medication to help with symptoms. Discussed return precautions.  ____________________________________________   FINAL CLINICAL IMPRESSION(S) / ED DIAGNOSES  Final diagnoses:  Abdominal pain, unspecified abdominal location  Colitis     Note: This dictation was prepared with Dragon dictation. Any transcriptional errors that result from this process are unintentional     Phineas Semen, MD 12/25/20 2106

## 2020-12-25 NOTE — ED Notes (Signed)
Pt agreeable with d/c plan as discussed by provider- this nurse has verbally reinforced d/c instructions and provided pt with written copy - pt acknowledges verbal understanding and denies any additional questions, concerns, needs.  Ambulatory at discharge independently with steady gait escorted by family member.

## 2020-12-25 NOTE — Discharge Instructions (Addendum)
Please seek medical attention for any high fevers, chest pain, shortness of breath, change in behavior, persistent vomiting, bloody stool or any other new or concerning symptoms.  

## 2020-12-25 NOTE — ED Triage Notes (Signed)
Pt presents via POV c/o rectal bleeding and abd pain since 0400. Sent here by Urgent Care. Reports weakness. Reports hx Diverticulitis in past per GI. Ambulatory to room.

## 2020-12-25 NOTE — ED Notes (Addendum)
This nurse assumed care-- pt awake and alert inquiring how much longer for CT study; spoke to [facility] in CT dept and was advised when pt labs are collected and resulted she will be next as long as kidney function is within normal range.  Pt updated on status-- pt has been stuck by two different phlebotomists and 3rd phlebotomist at bedside attempting lab draw.  Will monitor for acute changes and maintain plan of care.

## 2020-12-28 ENCOUNTER — Ambulatory Visit (INDEPENDENT_AMBULATORY_CARE_PROVIDER_SITE_OTHER): Payer: No Typology Code available for payment source | Admitting: Internal Medicine

## 2020-12-28 ENCOUNTER — Encounter (INDEPENDENT_AMBULATORY_CARE_PROVIDER_SITE_OTHER): Payer: Self-pay | Admitting: Internal Medicine

## 2020-12-28 VITALS — BP 147/82 | HR 105 | Temp 98.6°F | Wt 186.0 lb

## 2020-12-28 DIAGNOSIS — Z8619 Personal history of other infectious and parasitic diseases: Secondary | ICD-10-CM

## 2020-12-28 DIAGNOSIS — K5792 Diverticulitis of intestine, part unspecified, without perforation or abscess without bleeding: Secondary | ICD-10-CM

## 2020-12-28 DIAGNOSIS — F17200 Nicotine dependence, unspecified, uncomplicated: Secondary | ICD-10-CM

## 2020-12-28 DIAGNOSIS — I10 Essential (primary) hypertension: Secondary | ICD-10-CM

## 2020-12-28 LAB — CBC AND DIFFERENTIAL
Absolute NRBC: 0 10*3/uL (ref 0.00–0.00)
Basophils Absolute Automated: 0.05 10*3/uL (ref 0.00–0.08)
Basophils Automated: 0.7 %
Eosinophils Absolute Automated: 0.22 10*3/uL (ref 0.00–0.44)
Eosinophils Automated: 2.9 %
Hematocrit: 43.6 % (ref 34.7–43.7)
Hgb: 13.6 g/dL (ref 11.4–14.8)
Immature Granulocytes Absolute: 0.01 10*3/uL (ref 0.00–0.07)
Immature Granulocytes: 0.1 %
Lymphocytes Absolute Automated: 3.23 10*3/uL — ABNORMAL HIGH (ref 0.42–3.22)
Lymphocytes Automated: 42.2 %
MCH: 29.6 pg (ref 25.1–33.5)
MCHC: 31.2 g/dL — ABNORMAL LOW (ref 31.5–35.8)
MCV: 95 fL (ref 78.0–96.0)
MPV: 12.2 fL (ref 8.9–12.5)
Monocytes Absolute Automated: 0.67 10*3/uL (ref 0.21–0.85)
Monocytes: 8.7 %
Neutrophils Absolute: 3.48 10*3/uL (ref 1.10–6.33)
Neutrophils: 45.4 %
Nucleated RBC: 0 /100 WBC (ref 0.0–0.0)
Platelets: 276 10*3/uL (ref 142–346)
RBC: 4.59 10*6/uL (ref 3.90–5.10)
RDW: 13 % (ref 11–15)
WBC: 7.66 10*3/uL (ref 3.10–9.50)

## 2020-12-28 LAB — COMPREHENSIVE METABOLIC PANEL
ALT: 21 U/L (ref 0–55)
AST (SGOT): 15 U/L (ref 5–34)
Albumin/Globulin Ratio: 1 (ref 0.9–2.2)
Albumin: 3.8 g/dL (ref 3.5–5.0)
Alkaline Phosphatase: 70 U/L (ref 37–117)
Anion Gap: 9 (ref 5.0–15.0)
BUN: 10 mg/dL (ref 7.0–19.0)
Bilirubin, Total: 0.4 mg/dL (ref 0.2–1.2)
CO2: 26 mEq/L (ref 21–29)
Calcium: 9.9 mg/dL (ref 8.5–10.5)
Chloride: 104 mEq/L (ref 100–111)
Creatinine: 0.8 mg/dL (ref 0.4–1.5)
Globulin: 3.7 g/dL (ref 2.0–3.7)
Glucose: 93 mg/dL (ref 70–100)
Potassium: 4 mEq/L (ref 3.5–5.1)
Protein, Total: 7.5 g/dL (ref 6.0–8.3)
Sodium: 139 mEq/L (ref 136–145)

## 2020-12-28 LAB — C-REACTIVE PROTEIN: C-Reactive Protein: 0.5 mg/dL (ref 0.0–0.8)

## 2020-12-28 LAB — HEMOLYSIS INDEX: Hemolysis Index: 9 (ref 0–24)

## 2020-12-28 LAB — GFR: EGFR: >60

## 2020-12-28 MED ORDER — CIPROFLOXACIN HCL 500 MG PO TABS
500.00 mg | ORAL_TABLET | Freq: Two times a day (BID) | ORAL | 0 refills | Status: AC
Start: 2020-12-28 — End: 2021-01-07

## 2020-12-28 MED ORDER — METRONIDAZOLE 500 MG PO TABS
500.00 mg | ORAL_TABLET | Freq: Three times a day (TID) | ORAL | 0 refills | Status: AC
Start: 2020-12-28 — End: 2021-01-07

## 2020-12-28 NOTE — Progress Notes (Signed)
Chief Complaint   Patient presents with   . ER Follow-up     Rectal Bleeding and Abdominal Pain     65 y.o.  female presents for f/u of chronic conditions including:  Blood no bm x 2 days.  Yesterday jsut gas.  Today wiping blood but had some pebble stools.  No f/c. No bm since Friday.  Not eating much.    Ct: There is long segment wall thickening of the descending and   proximal sigmoid colon. There is occasional descending colonic   diverticulosis and sigmoid diverticulosis, which does not appear to   be the nidus of inflammatory findings. Findings are consistent with   nonspecific infectious, inflammatory, or ischemic colitis.     check meds    Per ER 4/9:  [name] is a 65 y.o. female who presents to the emergency department today because of concern for abdominal pain and rectal bleeding. The patient states that the pain started this morning around 1 am. Located primarily in the center of her stomach it did come and go. The patient then noticed around 4 or 5 in the morning she started having rectal bleeding. She would feel the need to go but no stool would come out, just blood. Patient states she has occasionally had pain like this in the past however has never had it this bad nor has it ever been accompanied by blood. She denies any unusual ingestions. Denies any abdominal surgeries.  Patient presented to the emergency department today because of concern for abdominal pain and rectal bleeding. On exam patient did have some tenderness to palpation in the center and left part of her stomach. Blood work without concerning leukocytosis, however given location of pain did have some concern for diverticulitis so CT scan was obtained. CT scan does show wall thickening of the descending and proximal sigmoid colon. Not clearly diverticulitis. At this time I do think likely inflammatory/infectious. Have lower concern for ischemia given lack of leukocytosis. Additionally patient did feel improvement after medication.  Patient stated she would like to be discharged home after I discussed CT scan results. Will plan on discharging with medication to help with symptoms. Discussed return precautions.     BP 147/82   Pulse (!) 105   Temp 98.6 F (37 C) (Oral)   Wt 84.4 kg (186 lb)   BMI 36.33 kg/m     PROBLEM LIST:  Diverticulitis--trial of cipro/flagyl.  Colitis on ct.  Sx persisting; bleeding abated.    Anxiety--tried lexapro 10mg  (palpitations, felt funny); refilled prn xanax 0.5mg  qhs;  Tried melatonin (woke groggy). Tried prozac (2 wks); doing therapy (sister died 2022-12-27).    HTN--on losartan-hctz 100-12.5mg ; compliant with meds; no se's.  Good control at home generally.  Start checking again.  BP Readings from Last 3 Encounters:   12/28/20 147/82   09/14/20 143/88   03/26/20 127/86   Lumbar disc disease with chronic pain--on tramadol 50mg  q8 prn, gabapentin 100mg  tid; had taken vicodin, percocet prior; sees pain management.    OA--bilat knees, hips, back (LLE sciatica)  Smoker--discussed that she could retry chantix if she wants but not ready right now.  Allergic rhinitis--on antihistamine prn; no side effects.  Good control   Chronic insomnia--on trazodone 50mg  only prn; tried ambien (headache).  Works well.   Osteopenia--bmd 12/27/2022 Tmin -2.2, frax not done  Eczema--on betamethasone cream prn  Carpal tunnel--bilat  Diverticulosis--on ct 7/21  H/o Hep C--s/p epclusa x 49mo; dx'd 11/20; undetectable 2/22 49mo after compelting  tx=cured; seeing gi  H/o Colon polyps--'15; rpt 6yrs  H/o L knee replacement  Obesity--Body mass index is 36.33 kg/m. Discussed working on diet/exercise.  Wt Readings from Last 3 Encounters:   12/28/20 84.4 kg (186 lb)   09/14/20 83.9 kg (185 lb)   03/26/20 83.9 kg (185 lb)     SOCIAL HISTORY: Works with mentally handicapped.  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  Smoker (1/4ppd x 30+yrs), no etoh    HEALTH MAINTENANCE:  Shingrix:  discussed  Prevnar:  Pneumovax:  EKG: 6/18  ASCVD Risk (12/28/2020):  CT chest: 12/05/17 nl  ASA:  BMD:   Ca/VitD:    Immunization History   Administered Date(s) Administered   . COVID-19 mRNA Vaccine Preservative Free 0.5 mL (MODERNA) 11/16/2019, 12/13/2019   . Influenza (Im) Preserved TRIVALENT VACCINE 06/30/2014   . Influenza Vacc QUAD Recombinant PF 9yrs & up 07/09/2018, 07/01/2019   . Influenza quadrivalent (IM) 6 months & up PRESERVED (Afluria/Fluzone) 07/15/2015   . Influenza quadrivalent (IM) PF 3 Yrs & greater 07/31/2016, 07/23/2017   . Tdap 12/17/2008, 06/30/2014      Health Maintenance Due   Topic Date Due   . Statin Use  Never done   . Advance Directive on File  Never done   . Shingrix Vaccine 50+ (1) Never done   . HIGH RISK PNEUMONIA  Never done   . DXA Scan  12/06/2019   . PAP SMEAR  01/02/2020   . COVID-19 Vaccine (3 - Booster) 06/14/2020   . Annual Exam  06/30/2020   . MAMMOGRAM  07/23/2020     Allergies   Allergen Reactions   . Latex Rash     Lab Results   Component Value Date    WBC 7.66 12/28/2020    HGB 13.6 12/28/2020    HCT 43.6 12/28/2020    PLT 276 12/28/2020    CHOL 192 07/01/2019    TRIG 104 07/01/2019    HDL 45 07/01/2019    LDL 126 (H) 07/01/2019    ALT 21 12/28/2020    AST 15 12/28/2020    NA 139 12/28/2020    K 4.0 12/28/2020    CL 104 12/28/2020    CREAT 0.8 12/28/2020    BUN 10.0 12/28/2020    CO2 26 12/28/2020    TSH 0.80 07/01/2019    INR 1.0 02/22/2017    GLU 93 12/28/2020    HGBA1C 5.5 07/01/2019     Lab Results   Component Value Date    VITD 66 07/01/2019     Review of Systems   Constitutional: Negative for fever and unexpected weight change.   HENT: Negative for congestion and postnasal drip.    Respiratory: Negative for cough, shortness of breath and wheezing.    Cardiovascular: Negative for chest pain and leg swelling.   Gastrointestinal: Positive for abdominal pain and blood in stool. Negative for constipation, diarrhea, nausea and vomiting.   Genitourinary: Negative for  dysuria and frequency.   Psychiatric/Behavioral: Negative for dysphoric mood and sleep disturbance.     Physical Exam  Constitutional:       General: She is not in acute distress.  Eyes:      Conjunctiva/sclera: Conjunctivae normal.   Neck:      Thyroid: No thyromegaly.   Cardiovascular:      Rate and Rhythm: Normal rate and regular rhythm.      Heart sounds: Normal heart sounds. No murmur heard.  No friction rub. No gallop.   Pulmonary:      Effort: Pulmonary effort is normal.      Breath sounds: Normal breath sounds. No wheezing or rales.   Abdominal:      General: Bowel sounds are normal. There is no distension.      Palpations: Abdomen is soft. There is no mass.      Tenderness: There is abdominal tenderness (LLQ and up L side ). There is no guarding or rebound.   Musculoskeletal:      Cervical back: Normal range of motion.   Lymphadenopathy:      Cervical: No cervical adenopathy.   Skin:     Findings: No rash.   Neurological:      Mental Status: She is alert and oriented to person, place, and time.   Psychiatric:         Behavior: Behavior normal.       HIgh BMI Follow Up   BMI Follow Up Care Plan Documented    ASSESSMENT/PLAN:  (See above for details)  [name] was seen today for er follow-up.    Diagnoses and all orders for this visit:    Diverticulitis of intestine without perforation or abscess without bleeding, unspecified part of intestinal tract  -     CBC and differential  -     Comprehensive metabolic panel  -     C Reactive Protein    History of hepatitis C    Essential hypertension    Smoker    Other orders  -     ciprofloxacin (Cipro) 500 MG tablet; Take 1 tablet (500 mg total) by mouth 2 (two) times daily for 10 days  -     metroNIDAZOLE (Flagyl) 500 MG tablet; Take 1 tablet (500 mg total) by mouth 3 (three) times daily for 10 days  -     GFR  -     Hemolysis index        Continue current meds for stable conditions  Symptomatic treatment reviewed in detail    Risk & Benefits of any new  medication(s) were explained to the patient who verbalized understanding & agreed to the treatment plan.     Call if symptoms persist, worsen, or change.  Call with updates/questions/concerns.

## 2020-12-29 NOTE — Progress Notes (Signed)
Ivin Poot,  Your labs looked good overall.  You're not anemic which is the most important part. Your sugar was good.  Liver, kidney numbers were normal.      Please feel free to contact us with any questions or concerns.    Best,     Lennice Sites

## 2020-12-30 ENCOUNTER — Other Ambulatory Visit (INDEPENDENT_AMBULATORY_CARE_PROVIDER_SITE_OTHER): Payer: Self-pay | Admitting: Internal Medicine

## 2020-12-30 MED ORDER — BETAMETHASONE DIPROPIONATE 0.05 % EX CREA
TOPICAL_CREAM | Freq: Two times a day (BID) | CUTANEOUS | 0 refills | Status: DC
Start: 2020-12-30 — End: 2021-05-17

## 2020-12-30 NOTE — Telephone Encounter (Signed)
Pt called to request a new prescription for an ointment that she was using for her eczema. Pt stated it started with a "B" but did not remember the name, I believe it may be this one but not sure.       Name, strength, directions of requested refill(s):    betamethasone dipropionate (DIPROLENE) 0.05 % cream      Pharmacy to send refill to or patient to pick up rx from office (mark requested pharmacy in BOLD):      [id number] - [place], [place] - [address]  [address] [address] [place] [address]  Phone: [phone] Fax: [phone]        Please mark "X" next to the preferred call back number:    Mobile:   Telephone Information:   Mobile [phone]       Home: @HOMEPHONE @    Work: @WORKPHONE @          Next visit: 01/27/2021

## 2020-12-31 DIAGNOSIS — K5730 Diverticulosis of large intestine without perforation or abscess without bleeding: Secondary | ICD-10-CM | POA: Insufficient documentation

## 2020-12-31 DIAGNOSIS — Z8619 Personal history of other infectious and parasitic diseases: Secondary | ICD-10-CM | POA: Insufficient documentation

## 2021-01-27 ENCOUNTER — Encounter (INDEPENDENT_AMBULATORY_CARE_PROVIDER_SITE_OTHER): Payer: No Typology Code available for payment source | Admitting: Family Medicine

## 2021-01-27 ENCOUNTER — Encounter (INDEPENDENT_AMBULATORY_CARE_PROVIDER_SITE_OTHER): Payer: No Typology Code available for payment source | Admitting: Internal Medicine

## 2021-02-25 ENCOUNTER — Encounter (INDEPENDENT_AMBULATORY_CARE_PROVIDER_SITE_OTHER): Payer: Self-pay | Admitting: Internal Medicine

## 2021-02-25 ENCOUNTER — Ambulatory Visit (INDEPENDENT_AMBULATORY_CARE_PROVIDER_SITE_OTHER): Payer: No Typology Code available for payment source | Admitting: Internal Medicine

## 2021-02-25 VITALS — BP 135/77 | HR 102 | Temp 98.4°F | Ht 60.0 in | Wt 188.0 lb

## 2021-02-25 DIAGNOSIS — Z0000 Encounter for general adult medical examination without abnormal findings: Secondary | ICD-10-CM

## 2021-02-25 DIAGNOSIS — Z124 Encounter for screening for malignant neoplasm of cervix: Secondary | ICD-10-CM

## 2021-02-25 DIAGNOSIS — Z23 Encounter for immunization: Secondary | ICD-10-CM

## 2021-02-25 DIAGNOSIS — F17200 Nicotine dependence, unspecified, uncomplicated: Secondary | ICD-10-CM

## 2021-02-25 DIAGNOSIS — I10 Essential (primary) hypertension: Secondary | ICD-10-CM

## 2021-02-25 DIAGNOSIS — Z780 Asymptomatic menopausal state: Secondary | ICD-10-CM

## 2021-02-25 DIAGNOSIS — F419 Anxiety disorder, unspecified: Secondary | ICD-10-CM

## 2021-02-25 DIAGNOSIS — Z1231 Encounter for screening mammogram for malignant neoplasm of breast: Secondary | ICD-10-CM

## 2021-02-25 LAB — COMPREHENSIVE METABOLIC PANEL
ALT: 15 U/L (ref 0–55)
AST (SGOT): 19 U/L (ref 5–34)
Albumin/Globulin Ratio: 1.1 (ref 0.9–2.2)
Albumin: 4 g/dL (ref 3.5–5.0)
Alkaline Phosphatase: 81 U/L (ref 37–117)
Anion Gap: 12 (ref 5.0–15.0)
BUN: 11 mg/dL (ref 7.0–19.0)
Bilirubin, Total: 0.5 mg/dL (ref 0.2–1.2)
CO2: 21 mEq/L (ref 21–29)
Calcium: 9.7 mg/dL (ref 8.5–10.5)
Chloride: 105 mEq/L (ref 100–111)
Creatinine: 0.8 mg/dL (ref 0.4–1.5)
Globulin: 3.5 g/dL (ref 2.0–3.6)
Glucose: 90 mg/dL (ref 70–100)
Protein, Total: 7.5 g/dL (ref 6.0–8.3)
Sodium: 138 mEq/L (ref 136–145)

## 2021-02-25 LAB — URINALYSIS
Bilirubin, UA: NEGATIVE
Blood, UA: NEGATIVE
Glucose, UA: NEGATIVE
Ketones UA: NEGATIVE
Leukocyte Esterase, UA: NEGATIVE
Nitrite, UA: NEGATIVE
Protein, UR: NEGATIVE
Specific Gravity UA: 1.009 (ref 1.001–1.035)
Urine pH: 6.5 (ref 5.0–8.0)
Urobilinogen, UA: 0.2 (ref 0.2–2.0)

## 2021-02-25 LAB — LIPID PANEL
Cholesterol / HDL Ratio: 5.4
Cholesterol: 234 mg/dL — ABNORMAL HIGH (ref 0–199)
HDL: 43 mg/dL (ref 40–9999)
LDL Calculated: 178 mg/dL — ABNORMAL HIGH (ref 0–99)
Triglycerides: 67 mg/dL (ref 34–149)
VLDL Calculated: 13 mg/dL (ref 10–40)

## 2021-02-25 LAB — HEMOLYSIS INDEX: Hemolysis Index: 75 — ABNORMAL HIGH (ref 0–24)

## 2021-02-25 LAB — TSH: TSH: 0.54 u[IU]/mL (ref 0.35–4.94)

## 2021-02-25 LAB — GFR: EGFR: >60

## 2021-02-25 MED ORDER — ALPRAZOLAM 0.5 MG PO TABS
0.5000 mg | ORAL_TABLET | Freq: Every evening | ORAL | 0 refills | Status: DC | PRN
Start: 2021-02-25 — End: 2022-03-14

## 2021-02-25 MED ORDER — BUPROPION HCL 75 MG PO TABS
75.0000 mg | ORAL_TABLET | Freq: Two times a day (BID) | ORAL | 3 refills | Status: DC
Start: 2021-02-25 — End: 2021-03-22

## 2021-02-25 NOTE — Progress Notes (Signed)
Chief Complaint   Patient presents with   . Annual Exam     Pt is fasting.     65 y.o.  female presents for Physical    BP 135/77   Pulse (!) 102   Temp 98.4 F (36.9 C) (Oral)   Ht 1.524 m (5')   Wt 85.3 kg (188 lb)   BMI 36.72 kg/m     PROBLEM LIST:  Anxiety--trial of wellbutrin 75mg  bid; tried lexapro 10mg  (palpitations, felt funny); refilled prn xanax 0.5mg  qhs;  Tried melatonin (woke groggy). Tried prozac (2 wks); doing therapy (sister died 12-23-2022).     HTN--on losartan-hctz 100-12.5mg ; compliant with meds; no se's.  Good control at home generally.  Start checking again.  BP Readings from Last 3 Encounters:   02/25/21 135/77   12/28/20 147/82   09/14/20 143/88   HLD--diet control but likely in range where statin recommended.    Lab Results   Component Value Date    CHOL 234 (H) 02/25/2021    TRIG 67 02/25/2021    HDL 43 02/25/2021    LDL 178 (H) 02/25/2021    CHOLHDLRATIO 5.4 02/25/2021    ALT 15 02/25/2021    AST 19 02/25/2021   Lumbar disc disease with chronic pain--on tramadol 50mg  q8 prn, gabapentin 100mg  tid; had taken vicodin, percocet prior; sees pain management.    OA--bilat knees, hips, back (LLE sciatica)  Smoker--may try chantix.  Prefers to try wellbutrin for right now  Allergic rhinitis--on antihistamine prn; no side effects.  Good control   Chronic insomnia--on trazodone 50mg  only prn; tried ambien (headache).  Works well.   Osteopenia--bmd 12/23/22 Tmin -2.2, frax not done; reorder  Eczema--on betamethasone cream prn  Carpal tunnel--bilat  Diverticulosis--on ct 7/21; H/o Diverticulitis  H/o Hep C--s/p epclusa x 66mo; dx'd 11/20; undetectable 2/22 66mo after compelting tx=cured; seeing gi  H/o Colon polyps--'15; rpt 26yrs   H/o L knee replacement  Obesity--Body mass index is 36.72 kg/m. Discussed working on diet/exercise. Up and down steps at work.  Trial of cutting carbs.  Wt Readings from Last 3 Encounters:   02/25/21 85.3 kg (188 lb)   12/28/20 84.4 kg (186 lb)   09/14/20 83.9 kg (185 lb)      SOCIAL HISTORY: Works with mentally handicapped.  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  Smoker (1/4ppd x 30+yrs), no etoh    HEALTH MAINTENANCE:  Shingrix: discussed  Prevnar:  Pneumovax:  EKG: 6/18  ASCVD Risk (02/25/2021):  CT chest: 12/05/17 nl  ASA:  BMD:   Ca/VitD:    Immunization History   Administered Date(s) Administered   . COVID-19 mRNA Vaccine Preservative Free 0.5 mL (MODERNA) 11/16/2019, 12/13/2019   . Influenza (Im) Preserved TRIVALENT VACCINE 06/30/2014   . Influenza Vacc QUAD Recombinant PF 70yrs & up 07/09/2018, 07/01/2019   . Influenza quadrivalent (IM) 6 months & up PRESERVED (Afluria/Fluzone) 07/15/2015   . Influenza quadrivalent (IM) PF 3 Yrs & greater 07/31/2016, 07/23/2017   . Tdap 12/17/2008, 06/30/2014   . Zoster [facility]) Vaccine Recombinant 02/25/2021      Health Maintenance Due   Topic Date Due   . Statin Use  Never done   . Advance Directive on File  Never done   . HIGH RISK PNEUMONIA  Never done   . DXA Scan  12/06/2019   . PAP SMEAR  01/02/2020   . COVID-19 Vaccine (3 - Booster) 06/14/2020   . MAMMOGRAM  07/23/2020  Allergies   Allergen Reactions   . Latex Rash     Lab Results   Component Value Date    WBC 7.66 12/28/2020    HGB 13.6 12/28/2020    HCT 43.6 12/28/2020    PLT 276 12/28/2020    CHOL 192 07/01/2019    TRIG 104 07/01/2019    HDL 45 07/01/2019    LDL 126 (H) 07/01/2019    ALT 21 12/28/2020    AST 15 12/28/2020    NA 139 12/28/2020    K 4.0 12/28/2020    CL 104 12/28/2020    CREAT 0.8 12/28/2020    BUN 10.0 12/28/2020    CO2 26 12/28/2020    TSH 0.80 07/01/2019    INR 1.0 02/22/2017    GLU 93 12/28/2020    HGBA1C 5.5 07/01/2019     Lab Results   Component Value Date    VITD 66 07/01/2019     Review of Systems   Constitutional: Negative for activity change, fatigue, fever and unexpected weight change.   HENT: Negative for congestion, dental problem, hearing loss and postnasal drip.    Eyes: Negative for visual disturbance.    Respiratory: Negative for cough, shortness of breath and wheezing.    Cardiovascular: Negative for chest pain, palpitations and leg swelling.   Gastrointestinal: Positive for abdominal pain and blood in stool. Negative for constipation, diarrhea, nausea and vomiting.   Genitourinary: Negative for dysuria, frequency and menstrual problem.   Musculoskeletal: Negative for arthralgias and back pain.   Skin: Negative for rash.   Allergic/Immunologic: Negative for environmental allergies.   Neurological: Negative for headaches.   Hematological: Negative for adenopathy. Does not bruise/bleed easily.   Psychiatric/Behavioral: Negative for dysphoric mood and sleep disturbance.     Physical Exam  Constitutional:       General: She is not in acute distress.  HENT:      Right Ear: Tympanic membrane, ear canal and external ear normal.      Left Ear: Tympanic membrane, ear canal and external ear normal.      Nose: Nose normal.      Mouth/Throat:      Dentition: Normal dentition.      Pharynx: No posterior oropharyngeal erythema.   Eyes:      Conjunctiva/sclera: Conjunctivae normal.      Pupils: Pupils are equal, round, and reactive to light.   Neck:      Thyroid: No thyromegaly.   Cardiovascular:      Rate and Rhythm: Normal rate and regular rhythm.      Heart sounds: Normal heart sounds. No murmur heard.    No friction rub. No gallop.   Pulmonary:      Breath sounds: Normal breath sounds. No wheezing or rales.   Abdominal:      General: Bowel sounds are normal. There is no distension.      Palpations: Abdomen is soft. There is no mass.      Tenderness: There is no abdominal tenderness.   Musculoskeletal:      Cervical back: Normal range of motion.   Lymphadenopathy:      Cervical: No cervical adenopathy.   Skin:     General: Skin is warm and dry.      Findings: No rash.      Nails: There is no clubbing.   Neurological:      Mental Status: She is alert and oriented to person, place, and time.      Cranial Nerves:  No cranial  nerve deficit.      Gait: Gait normal.   Psychiatric:         Behavior: Behavior normal.       HIgh BMI Follow Up   BMI Follow Up Care Plan Documented    ASSESSMENT/PLAN:  (See above for details)  Meliss was seen today for annual exam.    Diagnoses and all orders for this visit:    Annual physical exam  -     CBC and differential  -     Comprehensive metabolic panel  -     TSH  -     Urinalysis  -     Lipid panel    Encounter for screening mammogram for breast cancer  -     Mammo Digital 2D Screening Bilateral W CAD    Postmenopausal estrogen deficiency  -     Dxa Bone Density Axial Skeleton    Cervical cancer screening  -     Ambulatory referral to Obstetrics / Gynecology  -     Ambulatory referral to Obstetrics / Gynecology    Need for vaccination  -     Zoster Vaccine Recomb,Adjuvanted (IM)  -     Zoster Vaccine Recomb,Adjuvanted (IM); Future    Other orders  -     GFR  -     Hemolysis index  -     buPROPion (WELLBUTRIN) 75 MG tablet; Take 1 tablet (75 mg total) by mouth 2 (two) times daily  -     ALPRAZolam (XANAX) 0.5 MG tablet; Take 1 tablet (0.5 mg total) by mouth nightly as needed for Anxiety    -------------------------------------------------------------------------------------------------------------------------------------    During the course of her physical, patient also required labwork and medical management of her multiple chronic medical conditions including:     Anxiety--trial of wellbutrin 75mg  bid; tried lexapro 10mg  (palpitations, felt funny); refilled prn xanax 0.5mg  qhs;  Tried melatonin (woke groggy). Tried prozac (2 wks); doing therapy (sister died 12-14-2022).     HTN--on losartan-hctz 100-12.5mg ; compliant with meds; no se's.  Good control at home generally.  Start checking again.  BP Readings from Last 3 Encounters:   02/25/21 135/77   12/28/20 147/82   09/14/20 143/88   HLD--diet control but likely in range where statin recommended.    Lab Results   Component Value Date    CHOL 234 (H)  02/25/2021    TRIG 67 02/25/2021    HDL 43 02/25/2021    LDL 178 (H) 02/25/2021    CHOLHDLRATIO 5.4 02/25/2021    ALT 15 02/25/2021    AST 19 02/25/2021   OA--bilat knees, hips, back (LLE sciatica)  Smoker--may try chantix.  Prefers to try wellbutrin for right now  Allergic rhinitis--on antihistamine prn; no side effects.  Good control   Chronic insomnia--on trazodone 50mg  only prn; tried ambien (headache).  Works well.   Osteopenia--bmd 14-Dec-2022 Tmin -2.2, frax not done; reorder    Continue current meds for stable conditions  Symptomatic treatment reviewed in detail    Risk & Benefits of any new medication(s) were explained to the patient who verbalized understanding & agreed to the treatment plan.     Call if symptoms persist, worsen, or change.  Call with updates/questions/concerns.

## 2021-02-27 NOTE — Progress Notes (Signed)
Ivin Poot,  Your labs looked good overall.  Your sugar was good.  Liver, kidney, thyroid numbers were normal.  Cholesterol was significantly elevated.  You are in the range where cholesterol medication would be recommended.  We can either start that or do a trial of diet and exercise for a few months and reassess in 6 months.    Please feel free to contact us with any questions or concerns.    Best,     Lennice Sites

## 2021-03-08 ENCOUNTER — Other Ambulatory Visit (FREE_STANDING_LABORATORY_FACILITY): Payer: No Typology Code available for payment source

## 2021-03-08 DIAGNOSIS — Z0000 Encounter for general adult medical examination without abnormal findings: Secondary | ICD-10-CM

## 2021-03-08 LAB — CBC AND DIFFERENTIAL
Absolute NRBC: 0 10*3/uL (ref 0.00–0.00)
Basophils Absolute Automated: 0.06 10*3/uL (ref 0.00–0.08)
Basophils Automated: 0.8 %
Eosinophils Absolute Automated: 0.25 10*3/uL (ref 0.00–0.44)
Eosinophils Automated: 3.4 %
Hematocrit: 43.1 % (ref 34.7–43.7)
Hgb: 13.9 g/dL (ref 11.4–14.8)
Immature Granulocytes Absolute: 0.01 10*3/uL (ref 0.00–0.07)
Immature Granulocytes: 0.1 %
Lymphocytes Absolute Automated: 3.39 10*3/uL — ABNORMAL HIGH (ref 0.42–3.22)
Lymphocytes Automated: 45.4 %
MCH: 30.3 pg (ref 25.1–33.5)
MCHC: 32.3 g/dL (ref 31.5–35.8)
MCV: 93.9 fL (ref 78.0–96.0)
MPV: 11.9 fL (ref 8.9–12.5)
Monocytes Absolute Automated: 0.63 10*3/uL (ref 0.21–0.85)
Monocytes: 8.4 %
Neutrophils Absolute: 3.12 10*3/uL (ref 1.10–6.33)
Neutrophils: 41.9 %
Nucleated RBC: 0 /100 WBC (ref 0.0–0.0)
Platelets: 257 10*3/uL (ref 142–346)
RBC: 4.59 10*6/uL (ref 3.90–5.10)
RDW: 13 % (ref 11–15)
WBC: 7.46 10*3/uL (ref 3.10–9.50)

## 2021-03-10 NOTE — Progress Notes (Signed)
Ivin Poot,  Your blood level looked perfect--you're not anemic and no other abnormalities.  Sorry you had to come back for it!    Please feel free to contact us with any questions or concerns.    Best,     Lennice Sites

## 2021-03-20 ENCOUNTER — Other Ambulatory Visit (INDEPENDENT_AMBULATORY_CARE_PROVIDER_SITE_OTHER): Payer: Self-pay | Admitting: Internal Medicine

## 2021-03-25 ENCOUNTER — Encounter (INDEPENDENT_AMBULATORY_CARE_PROVIDER_SITE_OTHER): Payer: Self-pay

## 2021-03-26 ENCOUNTER — Encounter (INDEPENDENT_AMBULATORY_CARE_PROVIDER_SITE_OTHER): Payer: Self-pay

## 2021-04-04 NOTE — Progress Notes (Deleted)
Telemedicine Visit by Video  Telephone Information:   Mobile [phone]     Verbal consent has been obtained from the patient to conduct a video visit encounter to minimize exposure to COVID-19  Time spent in medical discussion:    No chief complaint on file.    65 y.o.  female presents for f/u of chronic conditions including:    There were no vitals taken for this visit.    PROBLEM LIST:  Anxiety--trial of wellbutrin 75mg  bid; tried lexapro 10mg  (palpitations, felt funny); refilled prn xanax 0.5mg  qhs;  Tried melatonin (woke groggy). Tried prozac (2 wks); doing therapy (sister died 12-13-22).     HTN--on losartan-hctz 100-12.5mg ; compliant with meds; no se's.  Good control at home generally.  Start checking again.  BP Readings from Last 3 Encounters:   02/25/21 135/77   12/28/20 147/82   09/14/20 143/88   HLD--diet control; statin range 6/22; diet & recheck 49mo   Lab Results   Component Value Date    CHOL 234 (H) 02/25/2021    TRIG 67 02/25/2021    HDL 43 02/25/2021    LDL 178 (H) 02/25/2021    CHOLHDLRATIO 5.4 02/25/2021    ALT 15 02/25/2021    AST 19 02/25/2021   Lumbar disc disease with chronic pain--on tramadol 50mg  q8 prn, gabapentin 100mg  tid; had taken vicodin, percocet prior; sees pain management.    OA--bilat knees, hips, back (LLE sciatica)  Smoker--may try chantix.  Prefers to try wellbutrin for right now  Allergic rhinitis--on antihistamine prn; no side effects.  Good control   Chronic insomnia--on trazodone 50mg  only prn; tried ambien (headache).  Works well.   Osteopenia--bmd 12-13-22 Tmin -2.2, frax not done; reorder  Eczema--on betamethasone cream prn  Carpal tunnel--bilat  Diverticulosis--on ct 7/21; H/o Diverticulitis  H/o Hep C--s/p epclusa x 272mo; dx'd 11/20; undetectable 2/22 272mo after compelting tx=cured; seeing gi  H/o Colon polyps--'15; rpt 98yrs   H/o L knee replacement  Obesity--There is no height or weight on file to calculate BMI. Discussed working on diet/exercise. Up and down steps at work.   Trial of cutting carbs.  Wt Readings from Last 3 Encounters:   02/25/21 85.3 kg (188 lb)   12/28/20 84.4 kg (186 lb)   09/14/20 83.9 kg (185 lb)     SOCIAL HISTORY: Works with mentally handicapped.  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  Smoker (1/4ppd x 30+yrs), no etoh    HEALTH MAINTENANCE:  Shingrix: discussed  Prevnar:  Pneumovax:  EKG: 6/18  ASCVD Risk (04/08/2021):  CT chest: 12/05/17 nl  ASA:  BMD:   Ca/VitD:    Immunization History   Administered Date(s) Administered    COVID-19 mRNA vaccine 12 years and above (Moderna) 100 mcg/0.5 mL 11/16/2019, 12/13/2019    Influenza (Im) Preserved TRIVALENT VACCINE 06/30/2014    Influenza Vacc QUAD Recombinant PF 75yrs & up 07/09/2018, 07/01/2019    Influenza quadrivalent (IM) 6 months & up PRESERVED (Afluria/Fluzone) 07/15/2015    Influenza quadrivalent (IM) PF 3 Yrs & greater 07/31/2016, 07/23/2017    Tdap 12/17/2008, 06/30/2014    Zoster [facility]) Vaccine Recombinant 02/25/2021      Health Maintenance Due   Topic Date Due    Statin Use  Never done    Advance Directive on File  Never done    HIGH RISK PNEUMONIA  Never done    PAP SMEAR  01/02/2020    DEPRESSION SCREENING  01/06/2020  COVID-19 Vaccine (3 - Booster) 05/14/2020     Allergies   Allergen Reactions    Latex Rash     Lab Results   Component Value Date    WBC 7.46 03/08/2021    HGB 13.9 03/08/2021    HCT 43.1 03/08/2021    PLT 257 03/08/2021    CHOL 234 (H) 02/25/2021    TRIG 67 02/25/2021    HDL 43 02/25/2021    LDL 178 (H) 02/25/2021    ALT 15 02/25/2021    AST 19 02/25/2021    NA 138 02/25/2021    K Hemolyzed (A) 02/25/2021    CL 105 02/25/2021    CREAT 0.8 02/25/2021    BUN 11.0 02/25/2021    CO2 21 02/25/2021    TSH 0.54 02/25/2021    INR 1.0 02/22/2017    GLU 90 02/25/2021    HGBA1C 5.5 07/01/2019     Lab Results   Component Value Date    VITD 66 07/01/2019     Review of Systems  Physical Exam  HIgh BMI Follow Up  BMI Follow Up Care Plan  Documented    ASSESSMENT/PLAN:  (See above for details)  There are no diagnoses linked to this encounter.    Continue current meds for stable conditions  Symptomatic treatment reviewed in detail    Risk & Benefits of any new medication(s) were explained to the patient who verbalized understanding & agreed to the treatment plan.     Call if symptoms persist, worsen, or change.  Call with updates/questions/concerns.

## 2021-04-08 ENCOUNTER — Telehealth (INDEPENDENT_AMBULATORY_CARE_PROVIDER_SITE_OTHER): Payer: No Typology Code available for payment source | Admitting: Internal Medicine

## 2021-04-19 NOTE — Progress Notes (Signed)
[name],  It actually looks like your bone density was a little improved this time. Definitely make sure to get around 1000 mg of calcium and 1000 units of vitamin d daily and work on weight bearing exercise (anything but swimming will help).  No need for a prescription medication at this point.  We'll recheck again in 2 years.  Feel free to call/email with any questions or concerns.  Best,   Lennice Sites

## 2021-04-25 ENCOUNTER — Encounter (INDEPENDENT_AMBULATORY_CARE_PROVIDER_SITE_OTHER): Payer: Self-pay

## 2021-04-26 ENCOUNTER — Encounter (INDEPENDENT_AMBULATORY_CARE_PROVIDER_SITE_OTHER): Payer: Self-pay

## 2021-05-02 NOTE — Progress Notes (Signed)
Telemedicine Visit by Video  Telephone Information:   Mobile [phone]     Verbal consent has been obtained from the patient to conduct a video visit encounter to minimize exposure to COVID-19  Time spent in medical discussion: 12    Chief Complaint   Patient presents with    Anxiety     65 y.o.  female presents for f/u of chronic conditions including:    There were no vitals taken for this visit.    PROBLEM LIST:  Anxiety--on wellbutrin 75mg  bid; tried lexapro 10mg  (palpitations, felt funny); refilled prn xanax 0.5mg  qhs;  Tried melatonin (woke groggy). Tried prozac (2 wks); doing therapy (sister died 2022/12/28).     HTN--on losartan-hctz 100-12.5mg ; compliant with meds; no se's.  Good control at home generally.  Start checking again.  BP Readings from Last 3 Encounters:   02/25/21 135/77   12/28/20 147/82   09/14/20 143/88   HLD--diet control; statin range 6/22; diet & recheck 69mo   Lab Results   Component Value Date    CHOL 234 (H) 02/25/2021    TRIG 67 02/25/2021    HDL 43 02/25/2021    LDL 178 (H) 02/25/2021    CHOLHDLRATIO 5.4 02/25/2021    ALT 15 02/25/2021    AST 19 02/25/2021   Lumbar disc disease with chronic pain--on tramadol 50mg  q8 prn, gabapentin 100mg  tid; had taken vicodin, percocet prior; sees pain management.    OA--bilat knees, hips, back (LLE sciatica)  Smoker--declines chantix.  Prefers to try wellbutrin for right now  Allergic rhinitis--on antihistamine prn; no side effects.  Good control   Chronic insomnia--on trazodone 50mg  only prn; tried ambien (headache).  Works well.   Osteopenia--bmd 7/22 Tmin -1.6, fx risk 3.3%, hip fx 0.4%; bmd 12/28/22 Tmin -2.2, frax not done; reorder  Eczema--on betamethasone cream prn  Carpal tunnel--bilat  Diverticulosis--on ct 7/21; H/o Diverticulitis  H/o Hep C--s/p epclusa x 640mo; dx'd 11/20; undetectable 2/22 61mo after compelting tx=cured; seeing gi  H/o Colon polyps--'15; rpt 65yrs   H/o L knee replacement  Obesity--There is no height or weight on file to calculate  BMI. Discussed working on diet/exercise. Up and down steps at work.  Trial of cutting carbs.  Wt Readings from Last 3 Encounters:   02/25/21 85.3 kg (188 lb)   12/28/20 84.4 kg (186 lb)   09/14/20 83.9 kg (185 lb)     SOCIAL HISTORY: Works with mentally handicapped.  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  Smoker (1/4ppd x 30+yrs), no etoh    HEALTH MAINTENANCE:  Shingrix: discussed  Prevnar:  Pneumovax:  EKG: 6/18  ASCVD Risk (05/03/2021):  CT chest: 12/05/17 nl  ASA:  BMD:   Ca/VitD:    Immunization History   Administered Date(s) Administered    COVID-19 mRNA vaccine 12 years and above (Moderna) 100 mcg/0.5 mL 11/16/2019, 12/13/2019    Influenza (Im) Preserved TRIVALENT VACCINE 06/30/2014    Influenza Vacc QUAD Recombinant PF 42yrs & up 07/09/2018, 07/01/2019    Influenza quadrivalent (IM) 6 months & up PRESERVED (Afluria/Fluzone) 07/15/2015    Influenza quadrivalent (IM) PF 3 Yrs & greater 07/31/2016, 07/23/2017    Tdap 12/17/2008, 06/30/2014    Zoster [facility]) Vaccine Recombinant 02/25/2021      Health Maintenance Due   Topic Date Due    Statin Use  Never done    Advance Directive on File  Never done    HIGH RISK PNEUMONIA  Never done  PAP SMEAR  01/02/2020    DEPRESSION SCREENING  01/06/2020    COVID-19 Vaccine (3 - Booster) 05/14/2020    INFLUENZA VACCINE  04/18/2021    Shingrix Vaccine 50+ (2) 04/22/2021     Allergies   Allergen Reactions    Latex Rash     Lab Results   Component Value Date    WBC 7.46 03/08/2021    HGB 13.9 03/08/2021    HCT 43.1 03/08/2021    PLT 257 03/08/2021    CHOL 234 (H) 02/25/2021    TRIG 67 02/25/2021    HDL 43 02/25/2021    LDL 178 (H) 02/25/2021    ALT 15 02/25/2021    AST 19 02/25/2021    NA 138 02/25/2021    K Hemolyzed (A) 02/25/2021    CL 105 02/25/2021    CREAT 0.8 02/25/2021    BUN 11.0 02/25/2021    CO2 21 02/25/2021    TSH 0.54 02/25/2021    INR 1.0 02/22/2017    GLU 90 02/25/2021    HGBA1C 5.5 07/01/2019     Lab Results    Component Value Date    VITD 66 07/01/2019     Review of Systems  Physical Exam  HIgh BMI Follow Up  BMI Follow Up Care Plan Documented    ASSESSMENT/PLAN:  (See above for details)  Earlyn was seen today for anxiety.    Diagnoses and all orders for this visit:    Anxiety    Essential hypertension    GAD (generalized anxiety disorder)    Smoker    Other orders  -     buPROPion (WELLBUTRIN) 75 MG tablet; Take 1 tablet (75 mg total) by mouth 2 (two) times daily    Continue current meds for stable conditions  Symptomatic treatment reviewed in detail    Risk & Benefits of any new medication(s) were explained to the patient who verbalized understanding & agreed to the treatment plan.     Call if symptoms persist, worsen, or change.  Call with updates/questions/concerns.

## 2021-05-03 ENCOUNTER — Telehealth (INDEPENDENT_AMBULATORY_CARE_PROVIDER_SITE_OTHER): Payer: No Typology Code available for payment source | Admitting: Internal Medicine

## 2021-05-03 DIAGNOSIS — F419 Anxiety disorder, unspecified: Secondary | ICD-10-CM

## 2021-05-03 DIAGNOSIS — F411 Generalized anxiety disorder: Secondary | ICD-10-CM

## 2021-05-03 DIAGNOSIS — I10 Essential (primary) hypertension: Secondary | ICD-10-CM

## 2021-05-03 DIAGNOSIS — F17200 Nicotine dependence, unspecified, uncomplicated: Secondary | ICD-10-CM

## 2021-05-03 MED ORDER — BUPROPION HCL 75 MG PO TABS
75.0000 mg | ORAL_TABLET | Freq: Two times a day (BID) | ORAL | 3 refills | Status: DC
Start: 2021-05-03 — End: 2022-04-07

## 2021-05-08 ENCOUNTER — Other Ambulatory Visit (INDEPENDENT_AMBULATORY_CARE_PROVIDER_SITE_OTHER): Payer: Self-pay | Admitting: Internal Medicine

## 2021-05-08 DIAGNOSIS — I10 Essential (primary) hypertension: Secondary | ICD-10-CM

## 2021-05-10 ENCOUNTER — Encounter (INDEPENDENT_AMBULATORY_CARE_PROVIDER_SITE_OTHER): Payer: Self-pay | Admitting: Internal Medicine

## 2021-05-17 ENCOUNTER — Other Ambulatory Visit (INDEPENDENT_AMBULATORY_CARE_PROVIDER_SITE_OTHER): Payer: Self-pay | Admitting: Internal Medicine

## 2021-05-26 ENCOUNTER — Encounter (INDEPENDENT_AMBULATORY_CARE_PROVIDER_SITE_OTHER): Payer: Self-pay

## 2021-05-27 ENCOUNTER — Encounter (INDEPENDENT_AMBULATORY_CARE_PROVIDER_SITE_OTHER): Payer: Self-pay

## 2021-06-25 ENCOUNTER — Encounter (INDEPENDENT_AMBULATORY_CARE_PROVIDER_SITE_OTHER): Payer: Self-pay

## 2021-06-26 ENCOUNTER — Encounter (INDEPENDENT_AMBULATORY_CARE_PROVIDER_SITE_OTHER): Payer: Self-pay

## 2021-07-26 ENCOUNTER — Encounter (INDEPENDENT_AMBULATORY_CARE_PROVIDER_SITE_OTHER): Payer: Self-pay

## 2021-08-15 ENCOUNTER — Ambulatory Visit (INDEPENDENT_AMBULATORY_CARE_PROVIDER_SITE_OTHER): Payer: No Typology Code available for payment source | Admitting: Internal Medicine

## 2021-08-15 ENCOUNTER — Encounter (INDEPENDENT_AMBULATORY_CARE_PROVIDER_SITE_OTHER): Payer: Self-pay | Admitting: Internal Medicine

## 2021-08-15 VITALS — BP 116/72 | HR 112 | Wt 188.0 lb

## 2021-08-15 DIAGNOSIS — E785 Hyperlipidemia, unspecified: Secondary | ICD-10-CM

## 2021-08-15 DIAGNOSIS — I10 Essential (primary) hypertension: Secondary | ICD-10-CM

## 2021-08-15 DIAGNOSIS — Z23 Encounter for immunization: Secondary | ICD-10-CM

## 2021-08-15 DIAGNOSIS — F419 Anxiety disorder, unspecified: Secondary | ICD-10-CM

## 2021-08-15 LAB — COMPREHENSIVE METABOLIC PANEL
ALT: 14 U/L (ref 0–55)
AST (SGOT): 14 U/L (ref 5–41)
Albumin/Globulin Ratio: 1.2 (ref 0.9–2.2)
Albumin: 4.1 g/dL (ref 3.5–5.0)
Alkaline Phosphatase: 82 U/L (ref 37–117)
Anion Gap: 6 (ref 5.0–15.0)
BUN: 15 mg/dL (ref 7.0–21.0)
Bilirubin, Total: 0.5 mg/dL (ref 0.2–1.2)
CO2: 29 mEq/L (ref 17–29)
Calcium: 10.3 mg/dL (ref 8.5–10.5)
Chloride: 105 mEq/L (ref 99–111)
Creatinine: 0.8 mg/dL (ref 0.4–1.0)
Globulin: 3.3 g/dL (ref 2.0–3.6)
Glucose: 95 mg/dL (ref 70–100)
Potassium: 4.4 mEq/L (ref 3.5–5.3)
Protein, Total: 7.4 g/dL (ref 6.0–8.3)
Sodium: 140 mEq/L (ref 135–145)

## 2021-08-15 LAB — LIPID PANEL
Cholesterol / HDL Ratio: 6.2 Index
Cholesterol: 255 mg/dL — ABNORMAL HIGH (ref 0–199)
HDL: 41 mg/dL (ref 40–9999)
LDL Calculated: 196 mg/dL — ABNORMAL HIGH (ref 0–99)
Triglycerides: 88 mg/dL (ref 34–149)
VLDL Calculated: 18 mg/dL (ref 10–40)

## 2021-08-15 LAB — HEMOLYSIS INDEX: Hemolysis Index: 5 Index (ref 0–24)

## 2021-08-15 LAB — GFR: EGFR: >60

## 2021-08-15 NOTE — Progress Notes (Signed)
Chief Complaint   Patient presents with    Hyperlipidemia    Flu Vaccine       Subjective:     [name] is a 65 y.o. female who presents for follow up. She is getting labs today. She is feeling well, she has no complaints. Would like flu shot today.     Review of Systems   Constitutional:  Negative for chills and fever.   Respiratory:  Negative for cough and shortness of breath.    Cardiovascular:  Negative for chest pain.   Gastrointestinal:  Negative for abdominal pain, blood in stool, nausea and vomiting.   Genitourinary:  Negative for hematuria.      Past Medical History:     Past Medical History:   Diagnosis Date    Abnormal vision     glasses    Anxiety     uses xanax @ bedtime    Arthritis     bilateral knees/OA hips/back    Carpal tunnel syndrome on both sides     stiffness noted     Difficulty walking     limps    Eczema     has prescription cream    Fracture     finger. resolved    Gastroesophageal reflux disease     heartburn recently diet related/some problem swallowing    History of gonorrhea     teenager    Hypertensive disorder     stable meds    Lower back pain     sciatica radiates down LLE: Pain range= 0-10    Neck pain     related to lower back but significant at this time     Neuropathy     numbness upon awakening from low back radiating down LLE .Marland Kitchen. 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         Past Surgical History:     Past Surgical History:   Procedure Laterality Date    ARTHROPLASTY, KNEE, TOTAL Left 12/14/2015    Procedure: ARTHROPLASTY, KNEE, TOTAL;  Surgeon: Cynda Familia, MD;  Location: [facility];  Service: Orthopedics;  Laterality: Left;  LEFT TOTAL KNEE ARTHROPLASTY    COLONOSCOPY  2016    X1 polyp    ENDOSCOPIC CARPAL TUNNEL RELEASE Right 03/02/2017    Procedure: ENDOSCOPIC CARPAL TUNNEL RELEASE;  Surgeon: Virgilio Frees., MD;  Location: [facility];  Service: Orthopedics;  Laterality: Right;    GANGLION CYST EXCISION Right 2007    x2    INDUCED ABORTION      KNEE ARTHROSCOPY Right 09/24/2014    KNEE ARTHROSCOPY Left 03/2015    REPAIR, UPPER EXTREMITY TENDON Right 03/02/2017    Procedure: REPAIR, UPPER EXTREMITY TENDON, EXCISION, GANGLION CYST;  Surgeon: Virgilio Frees., MD;  Location: [facility];  Service: Orthopedics;  Laterality: Right;  right wrist flexor carpi radialis tenosynovectomy w/ possible carpal tunnel release and soft tissue mass excision  q1-unk, asst=n, equip=needle tip bovie, beaver blade, lead hand, micro aire, md req 90 min/ok per JD    TONSILLECTOMY      VAGINAL DELIVERY      X2 spinals ( 6 natural deliveries)       Family History:     Family History   Problem Relation Age of Onset  Hypertension Mother     Stroke Mother     Hypertension Father     Arthritis Father     Cancer Sister         breast    Kidney disease Brother     Heart disease Maternal Grandmother        Social History:     Social History     Tobacco Use   Smoking Status Every Day    Packs/day: 0.25    Years: 30.00    Pack years: 7.50    Types: Cigarettes   Smokeless Tobacco Never   Tobacco Comments    a pack lasts several days     Social History     Substance and Sexual Activity   Alcohol Use No     Social History     Substance and Sexual Activity   Drug Use No       Allergies:     Allergies   Allergen Reactions    Latex Rash       Medications:     Prior to Admission medications    Medication Sig Start Date End Date Taking? Authorizing Provider   ALPRAZolam Prudy Feeler) 0.5 MG tablet Take 1 tablet (0.5 mg total) by mouth nightly as needed for Anxiety 02/25/21  Yes Vial, Grover Canavan, MD   Ascorbic Acid (VITAMIN C PO) Take by mouth   Yes [provider]   betamethasone dipropionate (DIPROLENE) 0.05 % cream APPLY TOPICALLY TWICE A DAY 05/17/21  Yes Vial, Grover Canavan, MD   losartan-hydrochlorothiazide (HYZAAR) 100-12.5 MG per tablet TAKE 1 TABLET BY MOUTH EVERY DAY  05/09/21  Yes Vial, Grover Canavan, MD   Magnesium 300 MG Cap Take by mouth   Yes [provider]   Multiple Vitamins-Minerals (CENTRUM ADULTS PO) Take by mouth   Yes [provider]   Vitamin D3 (CHOLECALCIFEROL) 50 MCG (2000 UT) tablet 1 tablet   Yes [provider]   vitamin E 100 UNIT capsule 1 capsule   Yes [provider]   buPROPion (WELLBUTRIN) 75 MG tablet Take 1 tablet (75 mg total) by mouth 2 (two) times daily 05/03/21   [name], MD   Sofosbuvir-Velpatasvir 400-100 MG Tab 1 tablet 10/13/19   [provider]         Objective:   BP 116/72   Pulse (!) 112   Wt 85.3 kg (188 lb)   BMI 36.72 kg/m     Physical Exam:     General: Not in any acute distress, groomed and well nourished. Alert and orientated to person, place and time.    HEENT: Normocephalic atraumatic without temporal wasting. Extraocular motions intact. No nasal discharge or flaring. Normal sclera without icterus. No conjunctival injection with no pallor.     Neck: Supple     Heart: Regular rate with regular rhythm. Normal and constant intensity of S1 and S2. No murmurs, gallops or rubs appreciated. Radial pulses 2+ bilaterally. No lower extremity pitting edema, bilaterally.    Lungs: Clear to auscultation, bilaterally. No increase in work of breathing or accesory muscle use. No wheezes, crackles or ronchi.    Abdomen: Soft, non tender. No organomegaly, no distention.     Extremities: No joint swelling, no deformities noted, no clubbing or lower extremity edema.    Skin: Extremities are warm and well perfused, bilaterally.     Neurologic: No apparent facial droop. Good muscle bulk and tone with grossly intact motor and sensory function in upper  and lower extremities, bilaterally.     Psych: Stable mood, pleasant and cooperates appropriately.    Labs:     No results for input(s): WBC, HGB, HCT, PLT, MCV in the last 72 hours.    No results for input(s): NA, K, CL, CO2, BUN, CREAT, GLU, CA, MG, PHOS in  the last 72 hours.    No results for input(s): AST, ALT, ALKPHOS, PROT, ALB in the last 72 hours.    No results for input(s): PTT, PT, INR in the last 72 hours.    No results found.     Assessment & Plan:     Santos was seen today for hyperlipidemia and flu vaccine.    Diagnoses and all orders for this visit:    Need for vaccination  -     Flu vaccine QUADRIVALENT (PF) 6 months and older (FLULAVAL/FLUARIX)    Essential hypertension    Dyslipidemia  -     Comprehensive metabolic panel  -     Lipid panel    Other orders  -     GFR  -     Hemolysis index    PROBLEM LIST:  Anxiety--tried of wellbutrin 75mg  bid; tried lexapro 10mg  (palpitations, felt funny); prn xanax 0.5mg  qhs;  Tried melatonin (woke groggy). Tried prozac (2 wks); doing therapy (sister died 12/10/22).     HTN--on losartan-hctz 100-12.5mg ; compliant with meds; no se's.  Good control at home generally.  Start checking again.      BP Readings from Last 3 Encounters:   02/25/21 135/77   12/28/20 147/82   09/14/20 143/88   HLD--diet control but likely in range where statin recommended. Follow up lipid panel        Lab Results   Component Value Date     CHOL 234 (H) 02/25/2021     TRIG 67 02/25/2021     HDL 43 02/25/2021     LDL 540 (H) 02/25/2021     CHOLHDLRATIO 5.4 02/25/2021     ALT 15 02/25/2021     AST 19 02/25/2021   Lumbar disc disease with chronic pain--off tramadol 50mg  q8 prn, off gabapentin 100mg  tid; had taken vicodin, percocet prior   OA--bilat knees, hips, back (LLE sciatica)  Smoker--chantix-felt sick.  Off wellbutrin  Allergic rhinitis--on antihistamine prn; no side effects.  Good control   Chronic insomnia--off trazodone 50mg  only prn; tried ambien (headache).  Xanax prn for sleep/anxiety-helps  Osteopenia--bmd 10-Dec-2022 Tmin -2.2, frax not done  Eczema--on betamethasone cream prn  Carpal tunnel--bilat  Diverticulosis--on ct 7/21; H/o Diverticulitis  H/o Hep C--s/p epclusa x 50mo; dx'd 11/20; undetectable 2/22 50mo after compelting tx=cured; seeing  gi  H/o Colon polyps--'15; rpt 27yrs   H/o L knee replacement  Obesity--Body mass index is 36.72 kg/m. Discussed working on diet/exercise. Up and down steps at work.  Trial of cutting carbs.      Wt Readings from Last 3 Encounters:   02/25/21 85.3 kg (188 lb)   12/28/20 84.4 kg (186 lb)   09/14/20 83.9 kg (185 lb)       Signed by,  Rob Hickman, MD  Internal Medicine, PGY-2     [facility]  [address], [address], [place], [place] [address]  Phone: [phone]  Fax: [phone]

## 2021-08-15 NOTE — Progress Notes (Unsigned)
U vacc

## 2021-08-16 NOTE — Progress Notes (Signed)
Attending Note:  Patient seen and examined.  Agree with above assessment/plan.  Rosine Solecki, M.D.

## 2021-08-22 ENCOUNTER — Telehealth (INDEPENDENT_AMBULATORY_CARE_PROVIDER_SITE_OTHER): Payer: Self-pay | Admitting: Internal Medicine

## 2021-08-22 ENCOUNTER — Other Ambulatory Visit (INDEPENDENT_AMBULATORY_CARE_PROVIDER_SITE_OTHER): Payer: Self-pay

## 2021-08-22 MED ORDER — ATORVASTATIN CALCIUM 20 MG PO TABS
20.0000 mg | ORAL_TABLET | Freq: Every day | ORAL | 2 refills | Status: DC
Start: 2021-08-22 — End: 2022-05-30

## 2021-08-22 NOTE — Telephone Encounter (Signed)
The patient did not read the email I sent.  Please call the patient and make sure they check their mychart or let them know they following results directly:    [name],  Your sugar was good.  Liver, kidney,  numbers were normal.    Unfortunately, cholesterol is higher this time and is definitely in the range of where we should start a statin medication to help prevent heart attack and stroke.  I sent you lipitor 20mg  to try.  Come on back in 3-4 months to recheck liver function and cholesterol numbers and make sure it is working ok.  Let me know if any issues with it in the meantime.     Please feel free to contact us with any questions or concerns.     Best,      Lennice Sites

## 2021-08-22 NOTE — Telephone Encounter (Signed)
Called pt regarding lab result.Advised, verbalized , understanding. Pt is ok to take medication .

## 2021-08-25 ENCOUNTER — Encounter (INDEPENDENT_AMBULATORY_CARE_PROVIDER_SITE_OTHER): Payer: Self-pay

## 2021-08-29 ENCOUNTER — Ambulatory Visit (INDEPENDENT_AMBULATORY_CARE_PROVIDER_SITE_OTHER): Payer: No Typology Code available for payment source

## 2021-09-25 ENCOUNTER — Encounter (INDEPENDENT_AMBULATORY_CARE_PROVIDER_SITE_OTHER): Payer: Self-pay

## 2021-09-27 ENCOUNTER — Encounter (INDEPENDENT_AMBULATORY_CARE_PROVIDER_SITE_OTHER): Payer: Self-pay | Admitting: Internal Medicine

## 2021-10-26 ENCOUNTER — Encounter (INDEPENDENT_AMBULATORY_CARE_PROVIDER_SITE_OTHER): Payer: Self-pay

## 2021-11-23 ENCOUNTER — Encounter (INDEPENDENT_AMBULATORY_CARE_PROVIDER_SITE_OTHER): Payer: Self-pay

## 2021-12-01 ENCOUNTER — Encounter (INDEPENDENT_AMBULATORY_CARE_PROVIDER_SITE_OTHER): Payer: Self-pay

## 2021-12-24 ENCOUNTER — Encounter (INDEPENDENT_AMBULATORY_CARE_PROVIDER_SITE_OTHER): Payer: Self-pay

## 2021-12-25 ENCOUNTER — Encounter (INDEPENDENT_AMBULATORY_CARE_PROVIDER_SITE_OTHER): Payer: Self-pay

## 2022-01-23 ENCOUNTER — Encounter (INDEPENDENT_AMBULATORY_CARE_PROVIDER_SITE_OTHER): Payer: Self-pay

## 2022-01-24 ENCOUNTER — Encounter (INDEPENDENT_AMBULATORY_CARE_PROVIDER_SITE_OTHER): Payer: Self-pay

## 2022-02-23 ENCOUNTER — Encounter (INDEPENDENT_AMBULATORY_CARE_PROVIDER_SITE_OTHER): Payer: Self-pay

## 2022-02-24 ENCOUNTER — Encounter (INDEPENDENT_AMBULATORY_CARE_PROVIDER_SITE_OTHER): Payer: Self-pay

## 2022-03-07 ENCOUNTER — Emergency Department: Payer: No Typology Code available for payment source

## 2022-03-07 ENCOUNTER — Emergency Department
Admission: EM | Admit: 2022-03-07 | Discharge: 2022-03-07 | Disposition: A | Discharge status: Home / Self Care | Payer: No Typology Code available for payment source | Attending: Emergency Medicine | Admitting: Emergency Medicine

## 2022-03-07 DIAGNOSIS — R109 Unspecified abdominal pain: Secondary | ICD-10-CM

## 2022-03-07 DIAGNOSIS — F17210 Nicotine dependence, cigarettes, uncomplicated: Secondary | ICD-10-CM | POA: Insufficient documentation

## 2022-03-07 DIAGNOSIS — R1011 Right upper quadrant pain: Secondary | ICD-10-CM | POA: Insufficient documentation

## 2022-03-07 LAB — BASIC METABOLIC PANEL
Anion Gap: 9 (ref 5.0–15.0)
BUN: 13 mg/dL (ref 7.0–21.0)
CO2: 21 mEq/L (ref 17–29)
Calcium: 9.7 mg/dL (ref 8.5–10.5)
Chloride: 107 mEq/L (ref 99–111)
Creatinine: 0.8 mg/dL (ref 0.4–1.0)
Glucose: 93 mg/dL (ref 70–100)
Potassium: 4 mEq/L (ref 3.5–5.3)
Sodium: 137 mEq/L (ref 135–145)
eGFR: >60 mL/min/{1.73_m2} (ref >=60)

## 2022-03-07 LAB — CBC AND DIFFERENTIAL
Absolute NRBC: 0 10*3/uL (ref 0.00–0.00)
Basophils Absolute Automated: 0.04 10*3/uL (ref 0.00–0.08)
Basophils Automated: 0.5 %
Eosinophils Absolute Automated: 0.25 10*3/uL (ref 0.00–0.44)
Eosinophils Automated: 3.3 %
Hematocrit: 42.1 % (ref 34.7–43.7)
Hgb: 13.9 g/dL (ref 11.4–14.8)
Immature Granulocytes Absolute: 0.01 10*3/uL (ref 0.00–0.07)
Immature Granulocytes: 0.1 %
Instrument Absolute Neutrophil Count: 3.19 10*3/uL (ref 1.10–6.33)
Lymphocytes Absolute Automated: 3.35 10*3/uL — ABNORMAL HIGH (ref 0.42–3.22)
Lymphocytes Automated: 44.5 %
MCH: 30 pg (ref 25.1–33.5)
MCHC: 33 g/dL (ref 31.5–35.8)
MCV: 90.7 fL (ref 78.0–96.0)
MPV: 11.3 fL (ref 8.9–12.5)
Monocytes Absolute Automated: 0.68 10*3/uL (ref 0.21–0.85)
Monocytes: 9 %
Neutrophils Absolute: 3.19 10*3/uL (ref 1.10–6.33)
Neutrophils: 42.6 %
Nucleated RBC: 0 /100 WBC (ref 0.0–0.0)
Platelets: 291 10*3/uL (ref 142–346)
RBC: 4.64 10*6/uL (ref 3.90–5.10)
RDW: 13 % (ref 11–15)
WBC: 7.52 10*3/uL (ref 3.10–9.50)

## 2022-03-07 LAB — HEPATIC FUNCTION PANEL
ALT: 17 U/L (ref 0–55)
AST (SGOT): 18 U/L (ref 5–41)
Albumin/Globulin Ratio: 1 (ref 0.9–2.2)
Albumin: 3.8 g/dL (ref 3.5–5.0)
Alkaline Phosphatase: 84 U/L (ref 37–117)
Bilirubin Direct: 0.1 mg/dL (ref 0.0–0.5)
Bilirubin Indirect: 0.2 mg/dL (ref 0.2–1.0)
Bilirubin, Total: 0.3 mg/dL (ref 0.2–1.2)
Globulin: 4 g/dL — ABNORMAL HIGH (ref 2.0–3.6)
Protein, Total: 7.8 g/dL (ref 6.0–8.3)

## 2022-03-07 LAB — LIPASE: Lipase: 17 U/L (ref 8–78)

## 2022-03-07 MED ORDER — MORPHINE SULFATE 4 MG/ML IJ/IV SOLN (WRAP)
4.0000 mg | Freq: Once | Status: AC
Start: 2022-03-07 — End: 2022-03-07
  Administered 2022-03-07: 4 mg via INTRAVENOUS
  Filled 2022-03-07: qty 1

## 2022-03-07 MED ORDER — LIDOCAINE 5 % EX PTCH
1.0000 | MEDICATED_PATCH | CUTANEOUS | 0 refills | Status: DC
Start: 2022-03-07 — End: 2022-04-07

## 2022-03-07 MED ORDER — NALOXONE HCL 4 MG/0.1ML NA LIQD
NASAL | 0 refills | Status: DC
Start: 2022-03-07 — End: 2022-03-14

## 2022-03-07 MED ORDER — IOHEXOL 350 MG/ML IV SOLN
100.0000 mL | Freq: Once | INTRAVENOUS | Status: AC | PRN
Start: 2022-03-07 — End: 2022-03-07
  Administered 2022-03-07: 100 mL via INTRAVENOUS

## 2022-03-07 MED ORDER — SODIUM CHLORIDE 0.9 % IV BOLUS
1000.0000 mL | Freq: Once | INTRAVENOUS | Status: AC
Start: 2022-03-07 — End: 2022-03-07
  Administered 2022-03-07: 1000 mL via INTRAVENOUS

## 2022-03-07 MED ORDER — HYDROCODONE-ACETAMINOPHEN 5-325 MG PO TABS
1.0000 | ORAL_TABLET | Freq: Four times a day (QID) | ORAL | 0 refills | Status: AC | PRN
Start: 2022-03-07 — End: 2022-03-14

## 2022-03-07 MED ORDER — ONDANSETRON HCL 4 MG/2ML IJ SOLN
4.0000 mg | Freq: Once | INTRAMUSCULAR | Status: AC
Start: 2022-03-07 — End: 2022-03-07
  Administered 2022-03-07: 4 mg via INTRAVENOUS
  Filled 2022-03-07: qty 2

## 2022-03-07 NOTE — Discharge Instructions (Signed)
Dear [name]:    Thank you for choosing the [facility] Emergency Department, the premier emergency department in the [place] area.  I hope your visit today was EXCELLENT. You will receive a survey via text message that will give you the opportunity to provide feedback to your team about your visit. Please do not hesitate to reach out with any questions!    Specific instructions for your visit today:    Please see your doctor in 1 to 2 days.  Please return to the ER immediately if develop new or worsening symptoms.  Please note that you were primarily tested for emergency conditions today in the emergency department.  We may occasionally find incidental findings on imaging or laboratory test that may not be considered emergent. It is important to follow-up with your primary care doctor and request your records as an outpatient so your primary care doctor may review them with you to ensure that these findings are reviewed and addressed.     IF YOU DO NOT CONTINUE TO IMPROVE OR YOUR CONDITION WORSENS, PLEASE CONTACT YOUR DOCTOR OR RETURN IMMEDIATELY TO THE EMERGENCY DEPARTMENT.    Sincerely,  Dashton Czerwinski, MD  Attending Emergency Physician  [facility] Emergency Department      OBTAINING A PRIMARY CARE APPOINTMENT    Primary care physicians (PCPs, also known as primary care doctors) are either internists or family medicine doctors. Both types of PCPs focus on health promotion, disease prevention, patient education and counseling, and treatment of acute and chronic medical conditions.    If you need a primary care doctor, please call the below number and ask who is receiving new patients.      Medical Group  Telephone:  [phone]  [email or web address]    DOCTOR REFERRALS  Call [phone] (available 24 hours a day, 7 days a week) if you need any further referrals and we can help you find a primary care doctor or specialist.  Also, available online at:   [email or web address]    YOUR CONTACT INFORMATION  Before leaving please check with registration to make sure we have an up-to-date contact number.  You can call registration at [phone] to update your information.  For questions about your hospital bill, please call [phone].  For questions about your Emergency Dept Physician bill please call [phone].      FREE HEALTH SERVICES  If you need help with health or social services, please call 2-1-1 for a free referral to resources in your area.  2-1-1 is a free service connecting people with information on health insurance, free clinics, pregnancy, mental health, dental care, food assistance, housing, and substance abuse counseling.  Also, available online at:  http://www.[facility].org    ORTHOPEDIC INJURY   Please know that significant injuries can exist even when an initial x-ray is read as normal or negative.  This can occur because some fractures (broken bones) are not initially visible on x-rays.  For this reason, close outpatient follow-up with your primary care doctor or bone specialist (orthopedist) is required.    MEDICATIONS AND FOLLOWUP  Please be aware that some prescription medications can cause drowsiness.  Use caution when driving or operating machinery.    The examination and treatment you have received in our Emergency Department is provided on an emergency basis, and is not intended to be a substitute for your primary care physician.  It is important that your doctor checks you again and that you report any new or remaining problems at that  time.      ASSISTANCE WITH INSURANCE    Affordable Care Act  (ACA)  Call to start or finish an application, compare plans, enroll or ask a question.  1-800-318-2596  TTY: 1-855-889-4325  Web:  Healthcare.gov    Help Enrolling in Medicaid  [employer] [place]  (855) 242-8282 (TOLL-FREE)  (888) 221-1590 (TTY)  Web:  Http://www.coverva.org    Local Help Enrolling in the ACA  [facility]  (571) 748-2580 (MAIN)  Email:  health-help@nvfs.org  Web:  Http://www.nvfs.org  Address:  [address], [address] [place], [place] [address]    SEDATING MEDICATIONS  Sedating medications include strong pain medications (e.g. narcotics), muscle relaxers, benzodiazepines (used for anxiety and as muscle relaxers), Benadryl/diphenhydramine and other antihistamines for allergic reactions/itching, and other medications.  If you are unsure if you have received a sedating medication, please ask your physician or nurse.  If you received a sedating medication: DO NOT drive a car. DO NOT operate machinery. DO NOT perform jobs where you need to be alert.  DO NOT drink alcoholic beverages while taking this medicine.     If you get dizzy, sit or lie down at the first signs. Be careful going up and down stairs.  Be extra careful to prevent falls.     Never give this medicine to others.     Keep this medicine out of reach of children.     Do not take or save old medicines. Throw them away when outdated.     Keep all medicines in a cool, dry place. DO NOT keep them in your bathroom medicine cabinet or in a cabinet above the stove.    MEDICATION REFILLS  Please be aware that we cannot refill any prescriptions through the ER. If you need further treatment from what is provided at your ER visit, please follow up with your primary care doctor or your pain management specialist.    FREESTANDING EMERGENCY DEPARTMENTS OF [facility]  Did you know [facility] has two freestanding ERs located just a few miles away?  [facility] [place] and [facility] [place] have short wait times, easy free parking directly in front of the building and top patient satisfaction scores - and the same Board Certified Emergency Medicine doctors as [facility].

## 2022-03-07 NOTE — ED Provider Notes (Signed)
[facility] EMERGENCY DEPARTMENT  ATTENDING PHYSICIAN HISTORY AND PHYSICAL EXAM     Patient Name: [name], [name]  Encounter Date:  03/07/2022  Attending Physician: Roselyn Reef MD  Room:  S 6/S 6  Patient DOB:  05-08-1956  Age: 66 y.o. female  MRN:  [id number]  PCP: [name], MD         Diagnosis/Disposition:     Final Impression  Final diagnoses:   None     Disposition  ED Disposition       None          Follow up  No follow-up provider specified.  Prescriptions  New Prescriptions    No medications on file           MDM:      Number and Complexity of Problems - moderate    Comorbidities impacting treatment: [smoker]    Social Determinants of Health that impact treatment or disposition: [n/a]    MDM Data    Independent historian: [yes]    External documents reviewed: Performed Chart Reviewed       My CT interpretation: See ED Course    My Ultrasound interpretation: See ED Course      Discussed with: See ED Course    Treatment and Disposition    Shared decision making: Performed with patient    Code status: [full code]    ED Course: 1523: Vitals stable. Pt is afebrile and well appearing. Patient has had no vomiting in the ER and patient tolerating PO challenge. Pt is NVI and passed road test. Pt feels better. Repeat abdominal exam is benign and nontender.  No signs of acute abdomen.  CBC and chemistry unremarkable.  Patient without any dysuria or urinary frequency, doubt UTI or pyelonephritis.  Ultrasound of right upper quadrant unremarkable.  CT of the abdomen shows no acute pathology.  CT negative for PE.  Patient without any red flags for back pain, doubt cauda equina or epidural abscess or osteomyelitis. Doubt mesenteric ischemia. Considered causes of female-specific abdominal pain unrelated to pregnancy (e.g., pelvic inflammatory disease with or without tubo-ovarian abscess, Fitz-Hugh-Curtis, etc.). Also considered causes of abdominal pain that are not gender-specific (e.g., appendicitis, volvulus,  small bowel obstruction, mesenteric adenitis, acute cholecystitis/choledocholithiasis and other biliary pathology, etc.). Also considered ovarian pathology like ovarian torsion or ruptured cyst and feel these are unlikely at this time.Patient given strict return precautions for worsening pain, inability to eat/drink, fevers (temperature over 100.16F), or other concerns.     I advised the patient to follow up with their primary care physician on an outpatient basis over the next 24-48 hours, and recommended prompt return to the emergency department if they have additional concerns or note worsening symptoms. Patient informed that this ED visit is only a snapshot in time of patient's possible condition and course of condition and that the patient's condition may change. At this time there is no acute/emergent immediately life threatening emergent condition apparent on exam, history, or studies. Patient deemed safe to follow up with PMD as outpatient.  The patient indicates understanding of these issues and agrees with the plan. A copy of the ED workup results was provided to the patient as well.          The patient's past medical records, including those in Care Everywhere when necessary, were reviewed by me.    This patient was seen and evaluated during the SARS-CoV-2 pandemic.The following personal protective equipment was used by me in the care of this  patient: Disposable face mask          History of Presenting Illness:     Nursing Triage note: Pt. ambulatory to triage with steady gait for c/o RUQ pain radiating to right upper back.  Pt. denies trauma/fall/injury, fever, chills, N/V, D and urinary symptoms. Pt. endorses difficulty breathing d/t pain upon inspiration.  Pt. maintaining SpO2 of 99% on RA.  Speaking in full sentences.    Chief complaint: Abdominal Pain (RUQ ) and Back Pain (Right upper back )    HPI  [name] is a 66 y.o. female w/PMH HTN, HLD, arthritis, GERD who presents with moderate  intermittent RT mid back pain radiating to RUQ x 3 weeks, worsening recently. Pt states the pain started on the right mid back and wraps around to her RUQ. Pt states pain is worse with deep breathing, with movement, bending or laying flat. Pt states she has tried motrin for pain with no relief. Pt endorses still having her gall bladder and denies fever, vomiting, pain worsening with eating, or dysuria.  No CP or SOB. No ext weakness/numbness, saddle anesthia, urinary/stool incontinence, fall or trauma.no hx of CA or IVDA         Review of Systems:  Physical Exam:     Review of Systems    All other systems reviewed and negative except what is specifically documented above.     Pulse (!) 116  BP (!) 167/99  Resp 20  SpO2 99 %  Temp 97.6 F (36.4 C)     General: NAD, alert, awake  HENT: Normocephalic, no signs of dehydration, throat clear, oral mucous membranes moist,  Eyes: EOMI, PERRL, no scleral icterus, no conjunctival pallor  Neck: Normal inspection, no masses, supple, no meningeal signs  Respiration: No respiratory distress, breath sounds normal, lungs clear   Cardiovascular: RRR, Radial pulses symmetric, pulses normal, heart sounds normal without murmurs  Abdomen: Soft, RUQ tenderness with deep palpation, non-distended, no pulsatile masses  Back: Normal inspection, no CVA tenderness, no midline tenderness  Skin: Normal color, warm, dry, no rash noted  Extremities: Normal appearance, full ROM  Psych: Mood normal, AOx3  Neuro: Cranial nerves II-XII intact, no gross sensory or motor deficits. Patient ambulating normally. 5/5 strength in BLE, SILT, +2 DTR patella bilat        Diagnostic Results:     Laboratory Studies:    All lab values have been personally reviewed by me    Results       ** No results found for the last 24 hours. **            Radiology Studies:    All images have been personally viewed by me    No orders to display           Interpretations, Clinical Decision Tools and Critical Care:     O2  Sat:  The patient's oxygen saturation was 99 % on room air. This was independently interpreted by me as Normal.            Procedures:   Procedures        Orders Placed During This Visit:     Encounter Orders:  Orders Placed This Encounter   Procedures    Basic Metabolic Panel    CBC and differential    Lipase    Hepatic function panel (LFT)    Urinalysis with microscopic    Urine HCG-POC test       Encounter Medications:  Medications -  No data to display        Allergies & Medications:     Allergies:  Sheis allergic to latex.    Home Medications               ALPRAZolam (XANAX) 0.5 MG tablet     Take 1 tablet (0.5 mg total) by mouth nightly as needed for Anxiety     Ascorbic Acid (VITAMIN C PO)     Take by mouth     atorvastatin (LIPITOR) 20 MG tablet     Take 1 tablet (20 mg) by mouth daily     betamethasone dipropionate (DIPROLENE) 0.05 % cream     APPLY TOPICALLY TWICE A DAY     buPROPion (WELLBUTRIN) 75 MG tablet     Take 1 tablet (75 mg total) by mouth 2 (two) times daily     losartan-hydrochlorothiazide (HYZAAR) 100-12.5 MG per tablet     TAKE 1 TABLET BY MOUTH EVERY DAY     Magnesium 300 MG Cap     Take by mouth     Multiple Vitamins-Minerals (CENTRUM ADULTS PO)     Take by mouth     Sofosbuvir-Velpatasvir 400-100 MG Tab     1 tablet     Vitamin D3 (CHOLECALCIFEROL) 50 MCG (2000 UT) tablet     1 tablet     vitamin E 100 UNIT capsule     1 capsule                 Past History:     Medical:   Past Medical History:   Diagnosis Date    Abnormal vision     glasses    Anxiety     uses xanax @ bedtime    Arthritis     bilateral knees/OA hips/back    Carpal tunnel syndrome on both sides     stiffness noted     Difficulty walking     limps    Eczema     has prescription cream    Fracture     finger. resolved    Gastroesophageal reflux disease     heartburn recently diet related/some problem swallowing    History of gonorrhea     teenager    Hypertensive disorder     stable meds    Lower back pain     sciatica  radiates down LLE: Pain range= 0-10    Neck pain     related to lower back but significant at this time     Neuropathy     numbness upon awakening from low back radiating down LLE .Marland Kitchen. 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       Surgical: She has a past surgical history that includes Ganglion cyst excision (Right, 2007); Knee arthroscopy (Right, 09/24/2014); Knee arthroscopy (Left, 03/2015); Colonoscopy (2016); Induced abortion; Vaginal delivery; ARTHROPLASTY, KNEE, TOTAL (Left, 12/14/2015); Tonsillectomy; REPAIR, UPPER EXTEMITY TENDON (Right, 03/02/2017); and ENDOSCOPIC CARPAL TUNNEL RELEASE (Right, 03/02/2017).    Family:   Family History   Problem Relation Age of Onset    Hypertension Mother     Stroke Mother     Hypertension Father     Arthritis Father     Cancer Sister         breast    Kidney disease Brother     Heart disease  Maternal Grandmother        Social: She reports that she has been smoking cigarettes. She has a 7.50 pack-year smoking history. She has never used smokeless tobacco. She reports that she does not drink alcohol and does not use drugs.        ATTESTATIONS     Roselyn Reef MD    Scribe Attestation:    I am the first provider for this patient and I personally performed the services documented. A. Alisa Graff is scribing for me on this chart. This note and the patient instructions accurately reflect work and decisions made by me.      Documentation Notes:    Parts of this note were generated by the Epic EMR system/ Dragon speech recognition and may contain inherent errors or omissions not intended by the user. Grammatical errors, random word insertions, deletions, pronoun errors and incomplete sentences are occasional consequences of this technology due to software limitations. Not all errors are caught or corrected.    My documentation is often completed after the patient is no longer under my  clinical care. In some cases, the Epic EMR may pull updated results into the above documentation which may not reflect all results or information that was available to me at the time of my medical decision making.     If there are questions or concerns about the content of this note or information contained within the body of this dictation they should be addressed directly with the author for clarification.                    Roselyn Reef, MD  03/07/22 1528

## 2022-03-11 NOTE — Progress Notes (Deleted)
No chief complaint on file.    66 y.o.  female presents for f/u of chronic conditions including:  Due for phys    There were no vitals taken for this visit.    PROBLEM LIST:  Anxiety--on wellbutrin 75mg  bid; tried lexapro 10mg  (palpitations, felt funny); refilled prn xanax 0.5mg  qhs;  Tried melatonin (woke groggy). Tried prozac (2 wks); doing therapy (sister died 3/19).     HTN--on losartan-hctz 100-12.5mg ; compliant with meds; no se's.  Good control at home generally.  Start checking again.  BP Readings from Last 3 Encounters:   03/07/22 150/86   08/15/21 116/72   02/25/21 135/77   HLD--on lipitor 20mg  since 11/22; no se's; recheck.  Lab Results   Component Value Date    CHOL 255 (H) 08/15/2021    TRIG 88 08/15/2021    HDL 41 08/15/2021    LDL 196 (H) 08/15/2021    CHOLHDLRATIO 6.2 08/15/2021    ALT 17 03/07/2022    AST 18 03/07/2022   Lumbar disc disease with chronic pain--on tramadol 50mg  q8 prn, gabapentin 100mg  tid; had taken vicodin, percocet prior; sees pain management.    OA--bilat knees, hips, back (LLE sciatica)  Smoker--declines chantix.  Prefers to try wellbutrin for right now  Allergic rhinitis--on antihistamine prn; no side effects.  Good control   Chronic insomnia--on trazodone 50mg  only prn; tried ambien (headache).  Works well.   Osteopenia--bmd 7/22 Tmin -1.6, fx risk 3.3%, hip fx 0.4%; bmd 3/19 Tmin -2.2, frax not done; reorder  Eczema--on betamethasone cream prn  Carpal tunnel--bilat  Diverticulosis--on ct 7/21; H/o Diverticulitis  H/o Hep C--s/p epclusa x 35mo; dx'd 11/20; undetectable 2/22 35mo after compelting tx=cured; seeing gi  H/o Colon polyps--'15; rpt [id number]   H/o Joint surgery--L knee replacement  Obesity--There is no height or weight on file to calculate BMI. Discussed working on diet/exercise. Up and down steps at work.  Trial of cutting carbs.  Wt Readings from Last 3 Encounters:   03/07/22 87.1 kg (192 lb)   08/15/21 85.3 kg (188 lb)   02/25/21 85.3 kg (188 lb)     SOCIAL HISTORY: Works  with mentally handicapped.  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  Smoker (1/4ppd x 30+yrs), no etoh    HEALTH MAINTENANCE:  Shingrix: discussed  Prevnar:  EKG: 6/18  CT chest: 12/05/17 nl  ASA:  BMD:   Ca/VitD:      03/26/2020    12:00 AM   PHQ2/PHQ9 SCORE    PHQ2 Score 0   PHQ9 Score 0     Lab Results   Component Value Date    WBC 7.52 03/07/2022    HGB 13.9 03/07/2022    HCT 42.1 03/07/2022    PLT 291 03/07/2022    CHOL 255 (H) 08/15/2021    TRIG 88 08/15/2021    HDL 41 08/15/2021    LDL 196 (H) 08/15/2021    ALT 17 03/07/2022    AST 18 03/07/2022    NA 137 03/07/2022    K 4.0 03/07/2022    CL 107 03/07/2022    CREAT 0.8 03/07/2022    BUN 13.0 03/07/2022    CO2 21 03/07/2022    TSH 0.54 02/25/2021    INR 1.0 02/22/2017    GLU 93 03/07/2022    HGBA1C 5.5 07/01/2019     Lab Results   Component Value Date    VITD 66 07/01/2019     Review of  Systems  Physical Exam  HIgh BMI Follow Up  BMI Follow Up Care Plan Documented    ASSESSMENT/PLAN:  (See above for details)  There are no diagnoses linked to this encounter.    Continue current meds for stable conditions  Symptomatic treatment reviewed in detail    Risk & Benefits of any new medication(s) were explained to the patient who verbalized understanding & agreed to the treatment plan.     Call if symptoms persist, worsen, or change.  Call with updates/questions/concerns.

## 2022-03-13 ENCOUNTER — Ambulatory Visit (INDEPENDENT_AMBULATORY_CARE_PROVIDER_SITE_OTHER): Payer: No Typology Code available for payment source | Admitting: Internal Medicine

## 2022-03-14 ENCOUNTER — Ambulatory Visit (INDEPENDENT_AMBULATORY_CARE_PROVIDER_SITE_OTHER): Payer: Self-pay | Admitting: Family Medicine

## 2022-03-14 ENCOUNTER — Encounter (INDEPENDENT_AMBULATORY_CARE_PROVIDER_SITE_OTHER): Payer: Self-pay | Admitting: Family Medicine

## 2022-03-14 VITALS — BP 125/83 | HR 120 | Temp 97.8°F

## 2022-03-14 DIAGNOSIS — M546 Pain in thoracic spine: Secondary | ICD-10-CM

## 2022-03-14 DIAGNOSIS — R000 Tachycardia, unspecified: Secondary | ICD-10-CM

## 2022-03-14 MED ORDER — MELOXICAM 15 MG PO TABS
15.0000 mg | ORAL_TABLET | Freq: Every day | ORAL | 0 refills | Status: DC
Start: 2022-03-14 — End: 2022-04-07

## 2022-03-14 NOTE — Progress Notes (Addendum)
Subjective:     Patient ID: Sabreena Vogan is a 66 y.o. female.    Chief Complaint:  Chief Complaint   Patient presents with    Back Pain     "I lift some water and my back just started growing and growing until I couldn't take it anymore"     HPI  Dulse Rutan is here today  -c/o back pain for 3 weeks  -says picked up case of water 3 weeks ago.  -later noticed back started to hurt  -no radiation to legs  -went to ER.did ct scan.  -back pain is 50 to 60 % better.  -no chest pain or palpitations          03/26/2020    12:00 AM   PHQ2/PHQ9 SCORE    PHQ2 Score 0   PHQ9 Score 0       Review Of Systems:  Review of Systems   Musculoskeletal:  Positive for back pain.       Objective:   Vital signs  BP 125/83   Pulse (!) 120   Temp 97.8 F (36.6 C) (Temporal)   SpO2 95%      Physical Exam  Physical Exam  Abdominal:      General: There is no distension.      Tenderness: There is no abdominal tenderness. There is no rebound.   Musculoskeletal:      Comments: Back - mild tenderness over t5 to t7            Visit Dx/Orders:   1. Acute thoracic back pain, unspecified back pain laterality  - meloxicam (MOBIC) 15 MG tablet; Take 1 tablet (15 mg) by mouth daily  Dispense: 20 tablet; Refill: 0    2. Tachycardia  - Cardiology Referral: Ida Rogue, MD [facility]); Future      Assessment/Plan:     1. Acute thoracic back pain, unspecified back pain laterality   Warm compress  Meloxicam prn  If not better in one month to return to clinic   2. Tachycardia  EKG today sinus tachycardia  Will require echo and holter monitor.  Ct angio shows calcification of coronary arteries  Referred to cardiologist.        Follow-up:   Return for f/u with pcp.      *Portions of this note may be dictated using Dragon naturally speaking voice recognition software. Variances in spelling and vocabulary are possible and unintentional. Not all areas may be caught/corrected. Please notify me of any discrepancies are noted or if the  meaning of any statement is not correct/clear.*    Roselie Skinner, MD

## 2022-03-14 NOTE — Addendum Note (Signed)
Addended byRoselie Skinner on: 03/14/2022 03:29 PM     Modules accepted: Orders

## 2022-03-14 NOTE — Addendum Note (Signed)
Addended byBartolo Darter on: 03/14/2022 01:42 PM     Modules accepted: Orders

## 2022-03-16 ENCOUNTER — Encounter (INDEPENDENT_AMBULATORY_CARE_PROVIDER_SITE_OTHER): Payer: Self-pay | Admitting: Family Medicine

## 2022-03-25 ENCOUNTER — Encounter (INDEPENDENT_AMBULATORY_CARE_PROVIDER_SITE_OTHER): Payer: Self-pay

## 2022-04-07 ENCOUNTER — Emergency Department: Payer: No Typology Code available for payment source

## 2022-04-07 ENCOUNTER — Inpatient Hospital Stay
Admission: AD | Admit: 2022-04-07 | Discharge: 2022-04-11 | DRG: 872 | Disposition: A | Discharge status: Home / Self Care | Payer: No Typology Code available for payment source | Source: Other Acute Inpatient Hospital | Attending: Internal Medicine | Admitting: Internal Medicine

## 2022-04-07 ENCOUNTER — Emergency Department
Admission: EM | Admit: 2022-04-07 | Discharge: 2022-04-07 | Disposition: A | Discharge status: Other Acute Inpatient Hospital | Payer: No Typology Code available for payment source | Attending: Emergency Medicine | Admitting: Emergency Medicine

## 2022-04-07 DIAGNOSIS — Z79899 Other long term (current) drug therapy: Secondary | ICD-10-CM

## 2022-04-07 DIAGNOSIS — J441 Chronic obstructive pulmonary disease with (acute) exacerbation: Secondary | ICD-10-CM | POA: Diagnosis present

## 2022-04-07 DIAGNOSIS — Z789 Other specified health status: Secondary | ICD-10-CM

## 2022-04-07 DIAGNOSIS — M160 Bilateral primary osteoarthritis of hip: Secondary | ICD-10-CM | POA: Diagnosis present

## 2022-04-07 DIAGNOSIS — K219 Gastro-esophageal reflux disease without esophagitis: Secondary | ICD-10-CM | POA: Diagnosis present

## 2022-04-07 DIAGNOSIS — I10 Essential (primary) hypertension: Secondary | ICD-10-CM | POA: Diagnosis present

## 2022-04-07 DIAGNOSIS — F17200 Nicotine dependence, unspecified, uncomplicated: Secondary | ICD-10-CM | POA: Insufficient documentation

## 2022-04-07 DIAGNOSIS — M1711 Unilateral primary osteoarthritis, right knee: Secondary | ICD-10-CM | POA: Diagnosis present

## 2022-04-07 DIAGNOSIS — A419 Sepsis, unspecified organism: Principal | ICD-10-CM | POA: Diagnosis present

## 2022-04-07 DIAGNOSIS — E872 Acidosis, unspecified: Secondary | ICD-10-CM | POA: Diagnosis present

## 2022-04-07 DIAGNOSIS — Z791 Long term (current) use of non-steroidal anti-inflammatories (NSAID): Secondary | ICD-10-CM

## 2022-04-07 DIAGNOSIS — I700 Atherosclerosis of aorta: Secondary | ICD-10-CM | POA: Diagnosis present

## 2022-04-07 DIAGNOSIS — R0602 Shortness of breath: Secondary | ICD-10-CM

## 2022-04-07 DIAGNOSIS — B9789 Other viral agents as the cause of diseases classified elsewhere: Secondary | ICD-10-CM | POA: Diagnosis present

## 2022-04-07 DIAGNOSIS — M479 Spondylosis, unspecified: Secondary | ICD-10-CM | POA: Diagnosis present

## 2022-04-07 DIAGNOSIS — E785 Hyperlipidemia, unspecified: Secondary | ICD-10-CM | POA: Diagnosis present

## 2022-04-07 DIAGNOSIS — Z20822 Contact with and (suspected) exposure to covid-19: Secondary | ICD-10-CM | POA: Diagnosis present

## 2022-04-07 DIAGNOSIS — J218 Acute bronchiolitis due to other specified organisms: Secondary | ICD-10-CM | POA: Diagnosis present

## 2022-04-07 DIAGNOSIS — F419 Anxiety disorder, unspecified: Secondary | ICD-10-CM | POA: Diagnosis present

## 2022-04-07 DIAGNOSIS — R911 Solitary pulmonary nodule: Secondary | ICD-10-CM | POA: Diagnosis present

## 2022-04-07 DIAGNOSIS — F17210 Nicotine dependence, cigarettes, uncomplicated: Secondary | ICD-10-CM | POA: Diagnosis present

## 2022-04-07 DIAGNOSIS — Z6839 Body mass index (BMI) 39.0-39.9, adult: Secondary | ICD-10-CM

## 2022-04-07 DIAGNOSIS — Z91040 Latex allergy status: Secondary | ICD-10-CM

## 2022-04-07 DIAGNOSIS — Z96652 Presence of left artificial knee joint: Secondary | ICD-10-CM | POA: Diagnosis present

## 2022-04-07 DIAGNOSIS — E669 Obesity, unspecified: Secondary | ICD-10-CM | POA: Diagnosis present

## 2022-04-07 DIAGNOSIS — J440 Chronic obstructive pulmonary disease with acute lower respiratory infection: Secondary | ICD-10-CM | POA: Diagnosis present

## 2022-04-07 LAB — COMPREHENSIVE METABOLIC PANEL
ALT: 15 U/L (ref 0–55)
ALT: 14 U/L (ref 0–55)
AST (SGOT): 16 U/L (ref 5–41)
AST (SGOT): 16 U/L (ref 5–41)
Albumin/Globulin Ratio: 1.1 (ref 0.9–2.2)
Albumin/Globulin Ratio: 1.1 (ref 0.9–2.2)
Albumin: 3.9 g/dL (ref 3.5–5.0)
Albumin: 3.5 g/dL (ref 3.5–5.0)
Alkaline Phosphatase: 77 U/L (ref 37–117)
Alkaline Phosphatase: 70 U/L (ref 37–117)
Anion Gap: 13 (ref 5.0–15.0)
Anion Gap: 10 (ref 5.0–15.0)
BUN: 9 mg/dL (ref 7.0–21.0)
BUN: 11 mg/dL (ref 7.0–21.0)
Bilirubin, Total: 0.4 mg/dL (ref 0.2–1.2)
Bilirubin, Total: 0.3 mg/dL (ref 0.2–1.2)
CO2: 22 mEq/L (ref 17–29)
CO2: 20 mEq/L (ref 17–29)
Calcium: 9.9 mg/dL (ref 8.5–10.5)
Calcium: 9.3 mg/dL (ref 8.5–10.5)
Chloride: 107 mEq/L (ref 99–111)
Chloride: 110 mEq/L (ref 99–111)
Creatinine: 0.8 mg/dL (ref 0.4–1.0)
Creatinine: 0.8 mg/dL (ref 0.4–1.0)
Globulin: 3.4 g/dL (ref 2.0–3.6)
Globulin: 3.2 g/dL (ref 2.0–3.6)
Glucose: 111 mg/dL — ABNORMAL HIGH (ref 70–100)
Glucose: 88 mg/dL (ref 70–100)
Potassium: 4.2 mEq/L (ref 3.5–5.3)
Potassium: 3.9 mEq/L (ref 3.5–5.3)
Protein, Total: 7.3 g/dL (ref 6.0–8.3)
Protein, Total: 6.7 g/dL (ref 6.0–8.3)
Sodium: 142 mEq/L (ref 135–145)
Sodium: 140 mEq/L (ref 135–145)
eGFR: >60 mL/min/{1.73_m2} (ref >=60)
eGFR: >60 mL/min/{1.73_m2} (ref >=60)

## 2022-04-07 LAB — CBC AND DIFFERENTIAL
Absolute NRBC: 0 10*3/uL (ref 0.00–0.00)
Absolute NRBC: 0 10*3/uL (ref 0.00–0.00)
Basophils Absolute Automated: 0.04 10*3/uL (ref 0.00–0.08)
Basophils Absolute Automated: 0.02 10*3/uL (ref 0.00–0.08)
Basophils Automated: 0.3 %
Basophils Automated: 0.1 %
Eosinophils Absolute Automated: 0.11 10*3/uL (ref 0.00–0.44)
Eosinophils Absolute Automated: 0 10*3/uL (ref 0.00–0.44)
Eosinophils Automated: 0.8 %
Eosinophils Automated: 0 %
Hematocrit: 41.6 % (ref 34.7–43.7)
Hematocrit: 38.7 % (ref 34.7–43.7)
Hgb: 13.6 g/dL (ref 11.4–14.8)
Hgb: 12.5 g/dL (ref 11.4–14.8)
Immature Granulocytes Absolute: 0.03 10*3/uL (ref 0.00–0.07)
Immature Granulocytes Absolute: 0.06 10*3/uL (ref 0.00–0.07)
Immature Granulocytes: 0.2 %
Immature Granulocytes: 0.4 %
Instrument Absolute Neutrophil Count: 9.99 10*3/uL — ABNORMAL HIGH (ref 1.10–6.33)
Instrument Absolute Neutrophil Count: 11.54 10*3/uL — ABNORMAL HIGH (ref 1.10–6.33)
Lymphocytes Absolute Automated: 2.46 10*3/uL (ref 0.42–3.22)
Lymphocytes Absolute Automated: 1.18 10*3/uL (ref 0.42–3.22)
Lymphocytes Automated: 18.3 %
Lymphocytes Automated: 8.7 %
MCH: 29.8 pg (ref 25.1–33.5)
MCH: 29.8 pg (ref 25.1–33.5)
MCHC: 32.7 g/dL (ref 31.5–35.8)
MCHC: 32.3 g/dL (ref 31.5–35.8)
MCV: 91.2 fL (ref 78.0–96.0)
MCV: 92.1 fL (ref 78.0–96.0)
MPV: 11 fL (ref 8.9–12.5)
MPV: 11.2 fL (ref 8.9–12.5)
Monocytes Absolute Automated: 0.81 10*3/uL (ref 0.21–0.85)
Monocytes Absolute Automated: 0.83 10*3/uL (ref 0.21–0.85)
Monocytes: 6 %
Monocytes: 6.1 %
Neutrophils Absolute: 9.99 10*3/uL — ABNORMAL HIGH (ref 1.10–6.33)
Neutrophils Absolute: 11.54 10*3/uL — ABNORMAL HIGH (ref 1.10–6.33)
Neutrophils: 74.4 %
Neutrophils: 84.7 %
Nucleated RBC: 0 /100 WBC (ref 0.0–0.0)
Nucleated RBC: 0 /100 WBC (ref 0.0–0.0)
Platelets: 218 10*3/uL (ref 142–346)
Platelets: 215 10*3/uL (ref 142–346)
RBC: 4.56 10*6/uL (ref 3.90–5.10)
RBC: 4.2 10*6/uL (ref 3.90–5.10)
RDW: 13 % (ref 11–15)
RDW: 14 % (ref 11–15)
WBC: 13.44 10*3/uL — ABNORMAL HIGH (ref 3.10–9.50)
WBC: 13.63 10*3/uL — ABNORMAL HIGH (ref 3.10–9.50)

## 2022-04-07 LAB — NT-PROBNP: NT-proBNP: 77 pg/mL (ref 0–125)

## 2022-04-07 LAB — HIGH SENSITIVITY TROPONIN-I: hs Troponin-I: 14.9 ng/L — AB

## 2022-04-07 LAB — ECG 12-LEAD
Atrial Rate: 126 {beats}/min
P Axis: 70 degrees
P-R Interval: 138 ms
Q-T Interval: 308 ms
QRS Duration: 76 ms
QTC Calculation (Bezet): 446 ms
R Axis: 72 degrees
T Axis: 48 degrees
Ventricular Rate: 126 {beats}/min

## 2022-04-07 LAB — COVID-19 (SARS-COV-2) & INFLUENZA  A/B, NAA (ROCHE LIAT)
Influenza A: NOT DETECTED
Influenza B: NOT DETECTED
SARS CoV 2 Overall Result: NOT DETECTED

## 2022-04-07 LAB — D-DIMER: D-Dimer: 0.34 ug/mL FEU (ref 0.00–0.60)

## 2022-04-07 LAB — LACTIC ACID: Lactic Acid: 0.9 mmol/L (ref 0.2–2.0)

## 2022-04-07 LAB — LACTIC ACID, PLASMA: Lactic Acid: 3.3 mmol/L — ABNORMAL HIGH (ref 0.2–2.0)

## 2022-04-07 MED ORDER — SODIUM CHLORIDE 0.9 % IV BOLUS
1000.0000 mL | Freq: Once | INTRAVENOUS | Status: AC
Start: 2022-04-07 — End: 2022-04-07
  Administered 2022-04-07: 1000 mL via INTRAVENOUS

## 2022-04-07 MED ORDER — AMOXICILLIN-POT CLAVULANATE 875-125 MG PO TABS
1.0000 | ORAL_TABLET | Freq: Once | ORAL | Status: AC
Start: 2022-04-07 — End: 2022-04-07
  Administered 2022-04-07: 1 via ORAL
  Filled 2022-04-07: qty 1

## 2022-04-07 MED ORDER — BENZONATATE 100 MG PO CAPS
200.0000 mg | ORAL_CAPSULE | Freq: Once | ORAL | Status: AC
Start: 2022-04-07 — End: 2022-04-07
  Administered 2022-04-07: 200 mg via ORAL
  Filled 2022-04-07: qty 2

## 2022-04-07 MED ORDER — STERILE WATER FOR INJECTION IJ/IV SOLN (WRAP)
1.0000 g | INTRAMUSCULAR | Status: DC
Start: 2022-04-07 — End: 2022-04-10
  Administered 2022-04-07 – 2022-04-09 (×3): 1 g via INTRAVENOUS
  Filled 2022-04-07 (×3): qty 1000

## 2022-04-07 MED ORDER — ATORVASTATIN CALCIUM 20 MG PO TABS
20.0000 mg | ORAL_TABLET | Freq: Every day | ORAL | Status: DC
Start: 2022-04-07 — End: 2022-04-11
  Administered 2022-04-07 – 2022-04-10 (×4): 20 mg via ORAL
  Filled 2022-04-07 (×4): qty 1

## 2022-04-07 MED ORDER — SODIUM CHLORIDE 0.9 % IV BOLUS
1000.0000 mL | Freq: Once | INTRAVENOUS | Status: DC
Start: 2022-04-07 — End: 2022-04-07

## 2022-04-07 MED ORDER — MELATONIN 3 MG PO TABS
3.0000 mg | ORAL_TABLET | Freq: Every evening | ORAL | Status: DC | PRN
Start: 2022-04-07 — End: 2022-04-11

## 2022-04-07 MED ORDER — GLUCOSE 40 % PO GEL (WRAP)
15.0000 g | ORAL | Status: DC | PRN
Start: 2022-04-07 — End: 2022-04-11

## 2022-04-07 MED ORDER — SODIUM CHLORIDE 0.9 % IV BOLUS
30.0000 mL/kg | Freq: Once | INTRAVENOUS | Status: DC
Start: 2022-04-07 — End: 2022-04-07

## 2022-04-07 MED ORDER — NICOTINE 7 MG/24HR TD PT24
1.0000 | MEDICATED_PATCH | Freq: Every day | TRANSDERMAL | Status: DC
Start: 2022-04-07 — End: 2022-04-11
  Filled 2022-04-07 (×6): qty 1

## 2022-04-07 MED ORDER — IPRATROPIUM BROMIDE 0.02 % IN SOLN
0.5000 mg | Freq: Once | RESPIRATORY_TRACT | Status: AC
Start: 2022-04-07 — End: 2022-04-07
  Administered 2022-04-07: 0.5 mg via RESPIRATORY_TRACT
  Filled 2022-04-07: qty 2.5

## 2022-04-07 MED ORDER — VITAMIN D 25 MCG (1000 UT) PO TABS
1.0000 | ORAL_TABLET | Freq: Every day | ORAL | Status: DC
Start: 2022-04-08 — End: 2022-04-11
  Administered 2022-04-08 – 2022-04-11 (×4): 25 ug via ORAL
  Filled 2022-04-07 (×4): qty 1

## 2022-04-07 MED ORDER — ACETAMINOPHEN 650 MG RE SUPP
650.0000 mg | Freq: Four times a day (QID) | RECTAL | Status: DC | PRN
Start: 2022-04-07 — End: 2022-04-11

## 2022-04-07 MED ORDER — IPRATROPIUM BROMIDE 0.02 % IN SOLN
0.5000 mg | Freq: Four times a day (QID) | RESPIRATORY_TRACT | Status: DC
Start: 2022-04-08 — End: 2022-04-07

## 2022-04-07 MED ORDER — FAMOTIDINE 20 MG PO TABS
20.0000 mg | ORAL_TABLET | Freq: Two times a day (BID) | ORAL | Status: DC
Start: 2022-04-08 — End: 2022-04-11
  Administered 2022-04-08 – 2022-04-11 (×5): 20 mg via ORAL
  Filled 2022-04-07 (×8): qty 1

## 2022-04-07 MED ORDER — DEXTROSE 10 % IV BOLUS
12.5000 g | INTRAVENOUS | Status: DC | PRN
Start: 2022-04-07 — End: 2022-04-11

## 2022-04-07 MED ORDER — PREDNISONE 20 MG PO TABS
40.0000 mg | ORAL_TABLET | Freq: Every morning | ORAL | Status: DC
Start: 2022-04-08 — End: 2022-04-07

## 2022-04-07 MED ORDER — KETOROLAC TROMETHAMINE 30 MG/ML IJ SOLN
30.0000 mg | Freq: Once | INTRAMUSCULAR | Status: DC
Start: 2022-04-07 — End: 2022-04-07

## 2022-04-07 MED ORDER — METHYLPREDNISOLONE SODIUM SUCC 40 MG IJ SOLR (WRAP)
40.0000 mg | Freq: Two times a day (BID) | INTRAMUSCULAR | Status: DC
Start: 2022-04-07 — End: 2022-04-09
  Administered 2022-04-08 – 2022-04-09 (×4): 40 mg via INTRAVENOUS
  Filled 2022-04-07 (×4): qty 1

## 2022-04-07 MED ORDER — DEXTROSE 50 % IV SOLN
12.5000 g | INTRAVENOUS | Status: DC | PRN
Start: 2022-04-07 — End: 2022-04-11

## 2022-04-07 MED ORDER — ACETAMINOPHEN 325 MG PO TABS
650.0000 mg | ORAL_TABLET | Freq: Four times a day (QID) | ORAL | Status: DC | PRN
Start: 2022-04-07 — End: 2022-04-11

## 2022-04-07 MED ORDER — ONDANSETRON 4 MG PO TBDP
4.0000 mg | ORAL_TABLET | Freq: Four times a day (QID) | ORAL | Status: DC | PRN
Start: 2022-04-07 — End: 2022-04-11

## 2022-04-07 MED ORDER — ALBUTEROL-IPRATROPIUM 2.5-0.5 (3) MG/3ML IN SOLN
3.0000 mL | Freq: Four times a day (QID) | RESPIRATORY_TRACT | Status: DC
Start: 2022-04-07 — End: 2022-04-09
  Administered 2022-04-07 – 2022-04-09 (×6): 3 mL via RESPIRATORY_TRACT
  Filled 2022-04-07 (×4): qty 3

## 2022-04-07 MED ORDER — ENOXAPARIN SODIUM 40 MG/0.4ML IJ SOSY
40.0000 mg | PREFILLED_SYRINGE | Freq: Every day | INTRAMUSCULAR | Status: DC
Start: 2022-04-08 — End: 2022-04-11
  Administered 2022-04-08 – 2022-04-11 (×4): 40 mg via SUBCUTANEOUS
  Filled 2022-04-07 (×4): qty 0.4

## 2022-04-07 MED ORDER — ONDANSETRON HCL 4 MG/2ML IJ SOLN
4.0000 mg | Freq: Four times a day (QID) | INTRAMUSCULAR | Status: DC | PRN
Start: 2022-04-07 — End: 2022-04-11

## 2022-04-07 MED ORDER — SODIUM CHLORIDE 0.9 % IV BOLUS
20.0000 mL/kg | Freq: Once | INTRAVENOUS | Status: AC
Start: 2022-04-07 — End: 2022-04-08
  Administered 2022-04-07: 1778 mL via INTRAVENOUS

## 2022-04-07 MED ORDER — NALOXONE HCL 0.4 MG/ML IJ SOLN (WRAP)
0.2000 mg | INTRAMUSCULAR | Status: DC | PRN
Start: 2022-04-07 — End: 2022-04-11

## 2022-04-07 MED ORDER — GUAIFENESIN-CODEINE 100-10 MG/5ML PO SYRP
5.0000 mL | ORAL_SOLUTION | ORAL | Status: DC | PRN
Start: 2022-04-07 — End: 2022-04-07

## 2022-04-07 MED ORDER — AZITHROMYCIN 500 MG IN 250 ML NS IVPB VIAL-MATE (CNR)
500.0000 mg | INTRAVENOUS | Status: AC
Start: 2022-04-07 — End: 2022-04-09
  Administered 2022-04-08 – 2022-04-09 (×3): 500 mg via INTRAVENOUS
  Filled 2022-04-07 (×3): qty 500

## 2022-04-07 MED ORDER — ALBUTEROL SULFATE (2.5 MG/3ML) 0.083% IN NEBU
7.5000 mg | INHALATION_SOLUTION | Freq: Once | RESPIRATORY_TRACT | Status: AC
Start: 2022-04-07 — End: 2022-04-07
  Administered 2022-04-07: 7.5 mg via RESPIRATORY_TRACT
  Filled 2022-04-07: qty 9

## 2022-04-07 MED ORDER — LOSARTAN POTASSIUM 100 MG PO TABS
ORAL_TABLET | Freq: Every day | ORAL | Status: DC
Start: 2022-04-08 — End: 2022-04-11
  Filled 2022-04-07 (×4): qty 1

## 2022-04-07 MED ORDER — KETOROLAC TROMETHAMINE 60 MG/2ML IM SOLN
60.0000 mg | Freq: Once | INTRAMUSCULAR | Status: AC
Start: 2022-04-07 — End: 2022-04-07
  Administered 2022-04-07: 30 mg via INTRAMUSCULAR
  Filled 2022-04-07: qty 2

## 2022-04-07 MED ORDER — PREDNISONE 20 MG PO TABS
40.0000 mg | ORAL_TABLET | Freq: Once | ORAL | Status: AC
Start: 2022-04-07 — End: 2022-04-07
  Administered 2022-04-07: 40 mg via ORAL
  Filled 2022-04-07: qty 2

## 2022-04-07 MED ORDER — GUAIFENESIN-CODEINE 100-10 MG/5ML PO SYRP
5.0000 mL | ORAL_SOLUTION | Freq: Four times a day (QID) | ORAL | Status: DC | PRN
Start: 2022-04-07 — End: 2022-04-11
  Administered 2022-04-07 – 2022-04-11 (×9): 5 mL via ORAL
  Filled 2022-04-07 (×9): qty 5

## 2022-04-07 MED ORDER — ALBUTEROL SULFATE (2.5 MG/3ML) 0.083% IN NEBU
2.5000 mg | INHALATION_SOLUTION | Freq: Four times a day (QID) | RESPIRATORY_TRACT | Status: DC
Start: 2022-04-08 — End: 2022-04-07

## 2022-04-07 MED ORDER — GLUCAGON 1 MG IJ SOLR (WRAP)
1.0000 mg | INTRAMUSCULAR | Status: DC | PRN
Start: 2022-04-07 — End: 2022-04-11

## 2022-04-07 MED ORDER — ALBUTEROL SULFATE (2.5 MG/3ML) 0.083% IN NEBU
2.5000 mg | INHALATION_SOLUTION | Freq: Once | RESPIRATORY_TRACT | Status: AC
Start: 2022-04-07 — End: 2022-04-07
  Administered 2022-04-07: 2.5 mg via RESPIRATORY_TRACT
  Filled 2022-04-07: qty 3

## 2022-04-07 NOTE — H&P (Signed)
[facility]      Patient: [name]  Date: 04/07/2022   DOB: [birth year]  Admission Date: 04/07/2022   MRN: [id number]  Attending: Burnett Kanaris, DO       No chief complaint on file.     History Gathered From: Self and medical record    HISTORY AND PHYSICAL     Maghan Akeema Broder is a 66 y.o. female with a PMHx tobacco use disorder, COPD, hypertension, hyperlipidemia, arthritis, and GERD who presented with sudden new onset nonproductive cough and shortness of breath that started yesterday evening after she got back from work.  She admits to smoking 2 to 3 cigarettes a day since she was 66 years old. She reports that she went to urgent care this morning where she was advised to go to a hospital.  She reports that her doctor recently diagnosed her with COPD but never has had exacerbation nor taking any medication for COPD at home. She denies having similar symptoms nor getting admitted for COPD in the past. Denies any chest pain or palpitation. During my exam, She was tachypneic with intermittent wet but nonproductive cough.    Patient transferred from [facility] emergency where she was initially evaluated. In the ER, she was Found to have elevated WBC 13.44, neutrophils absolute 9.99, and lactic acidosis 3.3.  Mild tachycardia; CT chest without contrast noted mild small airways wall thickening in the left lower lobe, infectious or inflammatory bronchitis/bronchiolitis; No consolidation; and Stable 4 mm nodule compared to June exam.     SH: See below    FH: See below      Past Medical History:   Diagnosis Date    Abnormal vision     glasses    Anxiety     uses xanax @ bedtime    Arthritis     bilateral knees/OA hips/back    Carpal tunnel syndrome on both sides     stiffness noted     Difficulty walking     limps    Eczema     has prescription cream    Fracture     finger. resolved    Gastroesophageal reflux disease     heartburn recently diet related/some problem swallowing    History of  gonorrhea     teenager    Hypertensive disorder     stable meds    Lower back pain     sciatica radiates down LLE: Pain range= 0-10    Neck pain     related to lower back but significant at this time     Neuropathy     numbness upon awakening from low back radiating down LLE .Marland Kitchen. 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       Past Surgical History:   Procedure Laterality Date    ARTHROPLASTY, KNEE, TOTAL Left 12/14/2015    Procedure: ARTHROPLASTY, KNEE, TOTAL;  Surgeon: Cynda Familia, MD;  Location: [place] [facility] OR;  Service: Orthopedics;  Laterality: Left;  LEFT TOTAL KNEE ARTHROPLASTY    COLONOSCOPY  2016    X1 polyp    ENDOSCOPIC CARPAL TUNNEL RELEASE Right 03/02/2017    Procedure: ENDOSCOPIC CARPAL TUNNEL RELEASE;  Surgeon: Virgilio Frees., MD;  Location: [place] [facility] OR;  Service: Orthopedics;  Laterality: Right;    GANGLION CYST EXCISION Right 2007  x2    INDUCED ABORTION      KNEE ARTHROSCOPY Right 09/24/2014    KNEE ARTHROSCOPY Left 03/2015    REPAIR, UPPER EXTREMITY TENDON Right 03/02/2017    Procedure: REPAIR, UPPER EXTREMITY TENDON, EXCISION, GANGLION CYST;  Surgeon: Virgilio Frees., MD;  Location: [place] [facility];  Service: Orthopedics;  Laterality: Right;  right wrist flexor carpi radialis tenosynovectomy w/ possible carpal tunnel release and soft tissue mass excision  q1-unk, asst=n, equip=needle tip bovie, beaver blade, lead hand, micro aire, md req 90 min/ok per JD    TONSILLECTOMY      VAGINAL DELIVERY      X2 spinals ( 6 natural deliveries)       Prior to Admission medications    Medication Sig Start Date End Date Taking? Authorizing Provider   atorvastatin (LIPITOR) 20 MG tablet Take 1 tablet (20 mg) by mouth daily 08/22/21   [name], MD   buPROPion (WELLBUTRIN) 75 MG tablet Take 1 tablet (75 mg total) by mouth 2 (two) times daily  Patient not taking: Reported on  03/14/2022 05/03/21   [name], MD   ELDERBERRY PO Take 3,200 mg by mouth    [provider]   lidocaine (LIDODERM) 5 % Place 1 patch onto the skin every 24 hours Remove & Discard patch within 12 hours or as directed by MD  Patient not taking: Reported on 03/14/2022 03/07/22   Missaghi, Babak, MD   losartan-hydrochlorothiazide (HYZAAR) 100-12.5 MG per tablet TAKE 1 TABLET BY MOUTH EVERY DAY 05/09/21   [name], MD   Magnesium 250 MG Tab Take 1 tablet (250 mg) by mouth    [provider]   meloxicam (MOBIC) 15 MG tablet Take 1 tablet (15 mg) by mouth daily 03/14/22   Roselie Skinner, MD   vitamin D (cholecalciferol) 25 MCG (1000 UT) tablet 1 tablet (25 mcg)    [provider]   Vitamin E 670 MG (1000 UT) Cap 1 capsule    [provider]       Allergies   Allergen Reactions    Latex Rash           Family History   Problem Relation Age of Onset    Hypertension Mother     Stroke Mother     Hypertension Father     Arthritis Father     Cancer Sister         breast    Kidney disease Brother     Heart disease Maternal Grandmother        Social History     Tobacco Use    Smoking status: Some Days     Packs/day: 0.25     Years: 30.00     Total pack years: 7.50     Types: Cigarettes    Smokeless tobacco: Never    Tobacco comments:     a pack lasts several days   Vaping Use    Vaping Use: Never used   Substance Use Topics    Alcohol use: No    Drug use: Yes     Comment: Marijuana       REVIEW OF SYSTEMS     Ten point review of systems negative or as per HPI and below endorsements.    PHYSICAL EXAM     Vital Signs (most recent): There were no vitals taken for this visit.  Constitutional: No apparent distress. Patient speaks freely in full sentences.   HEENT:  NC/AT, no scleral icterus or conjunctival pallor, no nasal discharge, MMM  Neck: trachea midline, no masses  Cardiovascular: RRR, normal S1 S2, no murmurs  Respiratory: Expiratory and inspiratory wheezes throughout all lung fields on  auscultation.  Gastrointestinal: +BS, non-distended, soft, non-tender, no rebound or guarding, no hepatosplenomegaly  Genitourinary: no suprapubic or costovertebral angle tenderness  Musculoskeletal: No clubbing, edema, or cyanosis. Capillary refill normal  Skin: Warm and dry  Neurologic: EOMI, CN 2-12 grossly intact. no gross motor or sensory deficits,   Psychiatric: affect and mood appropriate. The patient is alert and interactive    LABS & IMAGING     Recent Results (from the past 24 hour(s))   ECG 12 Lead    Collection Time: 04/07/22  8:35 AM   Result Value Ref Range    Ventricular Rate 126 BPM    Atrial Rate 126 BPM    P-R Interval 138 ms    QRS Duration 76 ms    Q-T Interval 308 ms    QTC Calculation (Bezet) 446 ms    P Axis 70 degrees    R Axis 72 degrees    T Axis 48 degrees    IHS MUSE NARRATIVE AND IMPRESSION       SINUS TACHYCARDIA  T WAVE ABNORMALITY, CONSIDER INFERIOR ISCHEMIA  ABNORMAL ECG  Confirmed by Leeroy Bock MD, NICHOLAS ([id number]) on 04/07/2022 3:19:53 PM     COVID-19 (SARS-CoV-2) and Influenza A/B, NAA ([facility] Rapid)    Collection Time: 04/07/22  9:00 AM    Specimen: Nasopharyngeal; Culturette   Result Value Ref Range    Purpose of COVID testing Diagnostic -PUI     SARS-CoV-2 Specimen Source Nasal Swab     SARS CoV 2 Overall Result Not Detected     Influenza A Not Detected     Influenza B Not Detected    CBC and differential    Collection Time: 04/07/22 10:10 AM   Result Value Ref Range    WBC 13.44 (H) 3.10 - 9.50 x10 3/uL    Hgb 13.6 11.4 - 14.8 g/dL    Hematocrit 81.1 91.4 - 43.7 %    Platelets 218 142 - 346 x10 3/uL    RBC 4.56 3.90 - 5.10 x10 6/uL    MCV 91.2 78.0 - 96.0 fL    MCH 29.8 25.1 - 33.5 pg    MCHC 32.7 31.5 - 35.8 g/dL    RDW 13 11 - 15 %    MPV 11.0 8.9 - 12.5 fL    Instrument Absolute Neutrophil Count 9.99 (H) 1.10 - 6.33 x10 3/uL    Neutrophils 74.4 None %    Lymphocytes Automated 18.3 None %    Monocytes 6.0 None %    Eosinophils Automated 0.8 None %    Basophils Automated 0.3 None %     Immature Granulocytes 0.2 None %    Nucleated RBC 0.0 0.0 - 0.0 /100 WBC    Neutrophils Absolute 9.99 (H) 1.10 - 6.33 x10 3/uL    Lymphocytes Absolute Automated 2.46 0.42 - 3.22 x10 3/uL    Monocytes Absolute Automated 0.81 0.21 - 0.85 x10 3/uL    Eosinophils Absolute Automated 0.11 0.00 - 0.44 x10 3/uL    Basophils Absolute Automated 0.04 0.00 - 0.08 x10 3/uL    Immature Granulocytes Absolute 0.03 0.00 - 0.07 x10 3/uL    Absolute NRBC 0.00 0.00 - 0.00 x10 3/uL   Comprehensive metabolic panel    Collection Time: 04/07/22 10:10 AM   Result  Value Ref Range    Glucose 111 (H) 70 - 100 mg/dL    BUN 9.0 7.0 - 16.1 mg/dL    Creatinine 0.8 0.4 - 1.0 mg/dL    Sodium 096 045 - 409 mEq/L    Potassium 4.2 3.5 - 5.3 mEq/L    Chloride 107 99 - 111 mEq/L    CO2 22 17 - 29 mEq/L    Calcium 9.9 8.5 - 10.5 mg/dL    Protein, Total 7.3 6.0 - 8.3 g/dL    Albumin 3.9 3.5 - 5.0 g/dL    AST (SGOT) 16 5 - 41 U/L    ALT 15 0 - 55 U/L    Alkaline Phosphatase 77 37 - 117 U/L    Bilirubin, Total 0.4 0.2 - 1.2 mg/dL    Globulin 3.4 2.0 - 3.6 g/dL    Albumin/Globulin Ratio 1.1 0.9 - 2.2    Anion Gap 13.0 5.0 - 15.0    eGFR >60.0 >=60 mL/min/1.73 m2   Lactic Acid    Collection Time: 04/07/22  3:11 PM   Result Value Ref Range    Lactic Acid 3.3 (H) 0.2 - 2.0 mmol/L       MICROBIOLOGY:  Blood Culture: Ordered  Urine Culture: Ordered  Antibiotics Started: ceftriaxone and azithromycin    IMAGING (xray images personally reviewed and concur with radiologist unless otherwise stated below):  No orders to display       CARDIAC:  EKG Interpretation (on my review 3):  sinus bradycardia    Markers:        EMERGENCY DEPARTMENT COURSE:  No orders of the defined types were placed in this encounter.      ASSESSMENT & PLAN     Lovene Maret is a 66 y.o. female admitted under observation for a sudden new onset cough and shortness of breath.  Smokes 2 to 3 cigarettes a day since she was 66 years old.  Recently diagnosed with COPD by her PCP.  Denies any  previous hospitalization due to COPD nor similar symptoms.     #Sepsis present on admission likely due to possible community-acquired pneumonia  #Dyspnea on exertion likely due to bronchitis/bronchiolitis and possible acute on chronic COPD exacerbation with possible CAP versus underlying viral infection VS possible viral illness  #Leukocytosis and lactic acidosis likely due to above  --WBC 13.4 and initial lactic acid 3.3.    --CT chest without contrast noted mild small airway wall thickening in the left lower lobe, infectious or inflammatory bronchitis/bronchiolitis  --Sepsis work-up initiated. Patient received 1 L of IV bolus at [facility] and the remaining of bolus given here to make up 74ml/kg bolus per sepsis protocol. She also received a dose of Augmentin in [place] ER.   --Started on IV azithromycin and ceftriaxone for atypical pneumonia coverage.    --Follow-up blood culture, sputum culture and viral panel  --Started on antitussive-Robitussin with codeine as needed  -- DuoNeb every 6 hrs  --Started Solu-Medrol IV 40 mg twice daily.  The patient received a dose of 40 mg of prednisone at [facility].  --D-dimer, BNP and troponin pending  --Trend lactic acid    #Stable 4 mm fissural based nodule in the left lower lobe on CT scan.  --Given patient's long history of smoking please advised the patient to follow-up with her PCP for monitor.    #Tobacco use disorder  -- Smoker since 66 years old.  Tobacco cessation counseling.  --Started nicotine patch.    #Hypertension, #hyperlipidemia  --Continue home  medication    #GERD  --Pepcid    FEN:  Fluid: SL  Electrolytes: note  Nutrition: Heart healthy diet      Patient has BMI=There is no height or weight on file to calculate BMI.  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     Monitoring:  Continuous pulse oximetry:  Telemetry needed:  Level of care:      CODE STATUS: full code  Surrogate decision maker or  Advanced Care Plan: Son    PRIMARY CARE MD: Roselie Skinner, MD    Safety Checklist  DVT prophylaxis: Chemical   Foley: Not present   IVs:  Peripheral IV   PT/OT: Not needed   Daily CBC & or Chem ordered: Yes, due to clinical and lab instability       Patient Lines/Drains/Airways Status       Active PICC Line / CVC Line / PIV Line / Drain / Airway / Intraosseous Line / Epidural Line / ART Line / Line / Wound / Pressure Ulcer / NG/OG Tube       Name Placement date Placement time Site Days    CPNB Catheter 03/02/17 Supraclavicular Right 03/02/17  1340  Supraclavicular  1862    Peripheral IV 04/07/22 20 G Diffusion Anterior;Right Forearm 04/07/22  1556  Forearm  less than 1                        Signed,  Lavena Stanford, FNP  04/07/2022 8:12 PM

## 2022-04-07 NOTE — ED Notes (Signed)
Accepted at [id number], Nurse to nurse report at [phone]

## 2022-04-07 NOTE — ED Notes (Addendum)
Tried to get repeat Lactic acid from the existing IV but wasn't able to. Attempted other vein to get blood lab but failed. MD awares.

## 2022-04-07 NOTE — ED Provider Notes (Signed)
[facility] EMERGENCY DEPARTMENT  ATTENDING PHYSICIAN HISTORY AND PHYSICAL EXAM     Patient Name: [name], [name]  Encounter Date:  04/07/2022  Attending Physician: Archie Endo, MD  Room:  9/M 9  Patient DOB:  07-07-1956  Age: 66 y.o. female  MRN:  [id number]  PCP: Roselie Skinner, MD     History of Presenting Illness     Nursing Triage note: Pt ambulatory, c/o SOB with cough, wheezing, chest pain and headache started yesterday. Able to speak in words.    Chief complaint: Shortness of Breath      [name] is a 66 y.o. female presenting to the ED for cough.  Patient states that for the last 24 hours she has had a dry nonproductive cough with associated wheezing.  She states that she is a daily smoker but has not been able to smoke since this episode started.  She states that she has never had to be hospitalized for her wheezing or admitted to the ICU.  She denies any fevers or chest pain and is without further complaints at this time.        Physical Exam     General appearance: Patient sitting in bed comfortably in no apparent distress. Well nourished. Appears stated age.  HEENT: The head is normocephalic and atraumatic, without tenderness, visible, or palpable masses. The eyes are normal in appearance without swelling or lesions. Sclera anicteric.The external ears are normal in appearance without swelling or lesions. The nasal mucosa is pink and moist. The neck is supple without adenopathy The oral mucosa is pink and moist.   Pulmonary: The chest wall is symmetric and without deformity. Normal work of breathing.  Patient is tachypneic with bilateral wheezing and mild rhonchi.  Cardiovascular: The external chest is normal in appearance and without deformity. Chest wall is non-tender. Heart rate and rhythm and rhythm are regular. No murmurs appreciated.  There is no edema to the lower extremities.  There is no leg asymmetry.  Abdominal: The abdomin is soft, non-tender, non-distended without rebound or  guarding. No masses. No organomegaly  GU: No CVA tenderness is appreciated.  Musculoskeletal: The upper and lower extremities are atraumatic in appearance without tenderness or deformity.   Neurologic: Awake and alert.   Psychiatric: Appropriate mood and affect.  Skin: Color normal for ethnicity. No rashes appreciated.        Medical Decision Making        Initial Patient Evaluation:    I evaluated the patient in the emergency department. I reviewed and independently interpreted all vital signs, nursing, and triage notes for this patient encounter. Previous external notes were reviewed. I also obtained and reviewed the patient's current medications, past medical, surgical, family, and social history, and medication allergies.     Differential Diagnosis:    After obtaining history and physical exam, I evaluated the patient for cough for potential acute moderate COPD exacerbation.  We will also consider possible infectious etiology such as pneumonia that includes community-acquired pneumonia versus COVID-19 and influenza.  I have low clinical suspicion for acute coronary syndrome as patient endorses no chest pain and low clinical suspicion for pulmonary embolism as patient's clinical picture represents likely infectious etiology.  In addition patient has had no recent stasis, surgery, no leg asymmetry, and no history of DVT or PE.    Clinical Course:    The patient presented to the emergency department with shortness of breath.  Patient was mildly hypoxic on clinical exam and was placed on  supplemental oxygen via nasal cannula 2 and then 4 L.  I provided the patient continuous nebulizer treatments of albuterol and ipratropium as well as oral steroids.  I also provided the patient antibiotics to cover for bronchitis with purulent discharge in a patient with known COPD.  The patient was a very hard stick for IV access however labs were obtained.  Labs and imaging obtained independently interpreted by me and these  findings were discussed with the patient at bedside.  Labs are remarkable for leukocytosis and elevated lactic acid.  I discussed these findings with the [facility] team agree with the current assessment and plan and have excepted the patient for transfer to their facility.        Interpretations     EKG independently interpreted by me: Sinus tachycardia 126 bpm normal axis no evidence of acute ischemia    Pulse Oximetry independently interpreted by me: 89% on room air 95% on 4 L nasal cannula.    Rhythm Strip  Monitor independently interpreted by me: Sinus tachycardia 120 bpm    Imaging independently interpreted by me: Chest x-ray demonstrates no cardiopulmonary pathology       Procedures & Critical Care     Critical Care Time(not including procedures): 45 minutes.  Patient was hypoxic and required supplemental oxygen and required multiple continuous nebulizer treatments for the treatment of acute moderate COPD exacerbation.  Due to the high risk of critical illness or multi-organ failure at initial presentation and/or during ED course.   System(s) at risk for compromise:  circulatory and respiratory  Critical Diagnosis:   1. COPD with acute exacerbation       The patient was Hypotensive:   No    The patient was Hypoxic:   Yes  This does not including time spent performing other reported procedures or services.  Critical care time involved full attention to the patient's condition and included:   Review of nursing notes and/or old charts - Yes  Documentation time - Yes  Care, transfer of care, and discharge plans - Yes  Obtaining necessary history from family, EMS, nursing home staff and/or treating physicians - Yes  Review of medications, allergies, and vital signs - Yes   Consultant collaboration on findings and treatment options - Yes  Ordering, interpreting, and reviewing diagnostic studies/tab tests - Yes           Diagnosis & Disposition     Final diagnoses:   COPD with acute exacerbation         Attestations     Archie Endoavid Johnathan Heskett, MD          Williemae NatterZodda, Nykira Reddix F, MD  04/07/22 1708

## 2022-04-07 NOTE — ED Notes (Signed)
2 IV attempts with [place] but failed. Was able to get blood samples. Lab specimens handed to the lab tech.

## 2022-04-07 NOTE — ED to IP RN Note (Signed)
[place] EMERGENCY ROOM - [facility]  ED NURSING NOTE FOR THE RECEIVING INPATIENT NURSE   ED NURSE Kathryne Hitch [phone]   ED CHARGE RN Margree Gimbel   ADMISSION INFORMATION   [name] is a 66 y.o. female admitted with an ED diagnosis of:    No diagnosis found.     Isolation: None   Allergies: Latex   Holding Orders confirmed? No   Belongings Documented? Yes   Home medications sent to pharmacy confirmed? N/A   NURSING CARE   Patient Comes From:   Mental Status: Home Independent  alert and oriented   ADL: Independent with all ADLs   Ambulation: no difficulty   Pertinent Information  and Safety Concerns:     Broset Violence Risk Level: Low Pt A/O x4, ambulatory, history of smoking. C/o SOB with cough and wheezing started yesterday. Was stable and then pulse ox dropped to 89% RA. Being stable after NC 4L/min.  Wasn't able to get an IV access.      CT / NIH   CT Head ordered on this patient?  N/A   NIH/Dysphagia assessment done prior to admission? N/A   VITAL SIGNS (at the time of this note)      Vitals:    04/07/22 1159   BP: 133/76   Pulse: (!) 125   Resp: (!) 24   Temp: 98.4 F (36.9 C)   SpO2: 93%

## 2022-04-08 LAB — CBC AND DIFFERENTIAL
Absolute NRBC: 0 10*3/uL (ref 0.00–0.00)
Basophils Absolute Automated: 0.02 10*3/uL (ref 0.00–0.08)
Basophils Automated: 0.2 %
Eosinophils Absolute Automated: 0 10*3/uL (ref 0.00–0.44)
Eosinophils Automated: 0 %
Hematocrit: 37.6 % (ref 34.7–43.7)
Hgb: 12.1 g/dL (ref 11.4–14.8)
Immature Granulocytes Absolute: 0.09 10*3/uL — ABNORMAL HIGH (ref 0.00–0.07)
Immature Granulocytes: 0.8 %
Instrument Absolute Neutrophil Count: 10.05 10*3/uL — ABNORMAL HIGH (ref 1.10–6.33)
Lymphocytes Absolute Automated: 0.61 10*3/uL (ref 0.42–3.22)
Lymphocytes Automated: 5.5 %
MCH: 30.3 pg (ref 25.1–33.5)
MCHC: 32.2 g/dL (ref 31.5–35.8)
MCV: 94 fL (ref 78.0–96.0)
MPV: 11.9 fL (ref 8.9–12.5)
Monocytes Absolute Automated: 0.23 10*3/uL (ref 0.21–0.85)
Monocytes: 2.1 %
Neutrophils Absolute: 10.05 10*3/uL — ABNORMAL HIGH (ref 1.10–6.33)
Neutrophils: 91.4 %
Nucleated RBC: 0 /100 WBC (ref 0.0–0.0)
Platelets: 221 10*3/uL (ref 142–346)
RBC: 4 10*6/uL (ref 3.90–5.10)
RDW: 14 % (ref 11–15)
WBC: 11 10*3/uL — ABNORMAL HIGH (ref 3.10–9.50)

## 2022-04-08 LAB — RESTRICTED TEST**RESPIRATORY PATHOGEN PANEL WITH COVID-19, PCR
Adenovirus: NOT DETECTED
Bordetella parapertussis PCR: NOT DETECTED
Bordetella pertussis: NOT DETECTED
Chlamydophila pneumoniae: NOT DETECTED
Coronavirus 229E: NOT DETECTED
Coronavirus HKU1: NOT DETECTED
Coronavirus NL63: NOT DETECTED
Coronavirus OC43: NOT DETECTED
Human Metapneumovirus: NOT DETECTED
Human Rhinovirus/Enterovirus: DETECTED — AB
Influenza A: NOT DETECTED
Influenza B: NOT DETECTED
Mycoplasma pneumoniae: NOT DETECTED
Parainfluenza Virus 1: NOT DETECTED
Parainfluenza Virus 2: NOT DETECTED
Parainfluenza Virus 3: NOT DETECTED
Parainfluenza Virus 4: NOT DETECTED
Respiratory Syncytial Virus: NOT DETECTED
SARS CoV-2 RNA: NOT DETECTED

## 2022-04-08 LAB — BASIC METABOLIC PANEL
Anion Gap: 9 (ref 5.0–15.0)
BUN: 8 mg/dL (ref 7.0–21.0)
CO2: 22 mEq/L (ref 17–29)
Calcium: 9.4 mg/dL (ref 8.5–10.5)
Chloride: 111 mEq/L (ref 99–111)
Creatinine: 0.7 mg/dL (ref 0.4–1.0)
Glucose: 134 mg/dL — ABNORMAL HIGH (ref 70–100)
Potassium: 4.1 mEq/L (ref 3.5–5.3)
Sodium: 142 mEq/L (ref 135–145)
eGFR: >60 mL/min/{1.73_m2} (ref >=60)

## 2022-04-08 NOTE — Progress Notes (Signed)
Assessment Smart phrase for obs and low risk patient        Introduced [employer] and self to patient and/or family Educational psychologist (name and contact tel #)Y    Discussed plan of care and possible discharge needs with patient/family/care-giver (Y, N) - if family/care-giver, provide name:Y    Patient has cognitive ability to participate in care decisions and follow-up care as needed (Y, N) - provide comments if no:Y    Verified (and corrected if needed) demographics and PCP on facesheet (Y, N)Y    Assessed for lack of insurance or underinsured. (Y, N)Y  If no insurance or underinsured, patient is high risk and needs high risk assessment     Patient is NOT identified as a Medicare focused patient or high risk for failed discharge plan or readmission as detailed in the High Risk Patient Screening policy (Y, N)    LACE = 5  Plan of care: IV Steroids    Discharge Plan A: Home  Discharge Plan B:    Expected discharge date:04/09/22    Barriers to discharge:              a) Barriers needing escalation:              b) Escalated to:       Tentative d/c date and CM # on white board:    Comments:    -Patient is A&Ox3. CM completed assessment with patient at bedside.  -Patient is I within her ADLs.  -Patient verified name, address, emergency contacts, PCP provider and insurance.  -Patient's car is at [employer] on [address], she will need a Lyft to get her car.       Flavia Shipper, BSN, RN, ACM-RN  Case Manager  [facility]  [phone]

## 2022-04-08 NOTE — Progress Notes (Signed)
[facility]  HOSPITALIST  PROGRESS NOTE      Patient: [name]  Date: 04/08/2022   LOS: 0 Days  Admission Date: 04/07/2022   MRN: [id number]  Attending: Garlan Fillers, MD     ASSESSMENT/PLAN     [name] is a 66 y.o. female admitted with      Sepsis present on admission likely due to possible CAP versus bronchitis/bronchiolitis and acute COPD exacerbation  Evidenced by lactic acidosis, leukocytosis, suspected infection.  CT chest noting inflammation versus infection LLL.  COVID-19 and influenza A/B negative.  D-dimer negative.  Received IVF resuscitation in ED, completed with resolution of lactic acidosis.  Ending blood cultures x2, viral panel, sputum culture.  Continue IV Rocephin and azithromycin, day #2.  Continue IV Solu-Medrol, scheduled DuoNebs, antitussives, antipyretics.  Oxygenating well on room air with ongoing severe wheezing.    Stable 4 mm lung nodule on CT imaging  Advise close follow-up for monitoring.    Tobacco abuse  Reports wanting to quit tobacco use, offered nicotine patch and declined at this time.    Hypertension, hyperlipidemia  Continue atorvastatin, losartan, hydrochlorothiazide.    GERD  Continue Pepcid.    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    Sinus tachycardia  Suspected in setting of #1, continue to monitor on telemetry.        Nutrition: Cardiac Diet    Safety Checklist  DVT prophylaxis: Chemical   Foley: Not present   IVs:  Peripheral IV   PT/OT: Not needed   Daily CBC & or Chem ordered: Yes, due to clinical and lab instability       Patient Lines/Drains/Airways Status       Active PICC Line / CVC Line / PIV Line / Drain / Airway / Intraosseous Line / Epidural Line / ART Line / Line / Wound / Pressure Ulcer / NG/OG Tube       Name Placement date Placement time Site Days    Peripheral IV 04/07/22 Distal;Posterior;Right Forearm 04/07/22  2000  Forearm  less than 1                         Code Status: full code    DISPO: home with family    Family  Contact: See chart    Care Plan discussed with nursing, consultants, case manager.       SUBJECTIVE     Laporsha Mueller states shortness of breath slightly improved today but continues to feel short of breath with exertion and has persistent wheezing.  Stating she wants to quit smoking after this hospitalization.      ROS     Remainder of 10 point ROS as above or otherwise negative    PHYSICAL EXAM     Vitals:    04/08/22 1200   BP: 140/78   Pulse: (!) 103   Resp: 16   Temp: 98.1 F (36.7 C)   SpO2: 94%       Temperature: Temp  Min: 98.1 F (36.7 C)  Max: 100 F (37.8 C)  Pulse: Pulse  Min: 103  Max: 124  Respiratory: Resp  Min: 16  Max: 22  Non-Invasive BP: BP  Min: 126/56  Max: 144/72  Pulse Oximetry SpO2  Min: 92 %  Max: 100 %    Intake and Output Summary (Last 24 hours) at Date Time  No intake or output data in the 24 hours ending 04/08/22 1257  General: Awake and alert. No acute distress.  Obese.  ENT: Pupils equal and reactive.   Neck: Supple, FROM.  Cardiovascular: RRR. No murmurs.   Pulmonary: On room air.  No respiratory distress.  Coarse wheezing bilaterally.  Abdomen: Soft, nontender, nondistended. Normoactive BS.   Extremities: No edema. MAEWx4.  Skin: No rashes or lesions noted.   Neuro: No focal neurological deficits. Alert and oriented x3.  Psych: Mood and affect appropriate.     MEDICATIONS     Current Facility-Administered Medications   Medication Dose Route Frequency    albuterol-ipratropium  3 mL Nebulization Q6H SCH    atorvastatin  20 mg Oral Daily    cefTRIAXone  1 g Intravenous Q24H    And    azithromycin  500 mg Intravenous Q24H    enoxaparin  40 mg Subcutaneous Daily    famotidine  20 mg Oral Q12H SCH    losartan (COZAAR) 100 mg, hydroCHLOROthiazide (HYDRODIURIL) 12.5 mg for HYZAAR   Oral Daily    methylPREDNISolone  40 mg Intravenous BID    nicotine  1 patch Transdermal Daily    vitamin D  1 tablet Oral Daily       LABS     Recent Labs   Lab 04/08/22  0459 04/07/22  2130  04/07/22  1010   WBC 11.00* 13.63* 13.44*   RBC 4.00 4.20 4.56   Hgb 12.1 12.5 13.6   Hematocrit 37.6 38.7 41.6   MCV 94.0 92.1 91.2   Platelets 221 215 218       Recent Labs   Lab 04/08/22  0459 04/07/22  2130 04/07/22  1010   Sodium 142 140 142   Potassium 4.1 3.9 4.2   Chloride 111 110 107   CO2 22 20 22    BUN 8.0 11.0 9.0   Creatinine 0.7 0.8 0.8   Glucose 134* 88 111*   Calcium 9.4 9.3 9.9       Recent Labs   Lab 04/07/22  2130 04/07/22  1010   ALT 14 15   AST (SGOT) 16 16   Bilirubin, Total 0.3 0.4   Albumin 3.5 3.9   Alkaline Phosphatase 70 77       Recent Labs   Lab 04/07/22  2326   hs Troponin-I 14.9*             Microbiology Results (last 15 days)       Procedure Component Value Units Date/Time    CULTURE + Dierdre ForthGRAM STAIN,AEROBIC,RESPIRATORY [[id number]][[id number]] Collected: 04/08/22 [id number]    Order Status: Sent Specimen: Sputum, Expectorated Updated: 04/08/22 0822    COVID-19 (SARS-CoV-2) only ([facility]iat Rapid) asymptomatic admission [[id number]][[id number]] Collected: 04/08/22 0431    Order Status: Canceled Specimen: Nasopharyngeal     Respiratory Pathogen Panel w/COVID-19, PCR [[id number]][[id number]] Collected: 04/08/22 0431    Order Status: Completed Specimen: Nasopharyngeal Updated: 04/08/22 0858     Source NP Swab     Purpose of COVID testing Diagnostic -PUI    Narrative:      For NP, Call laboratory for VTM NP Swab collection device.  Bronchoalveolar Lavage is submitted in a sterile container.  Diagnostic -PUI    Respiratory Pathogen Panel w/COVID-19, PCR [[id number]][[id number]] Collected: 04/08/22 0248    Order Status: Canceled Specimen: Nasopharyngeal     Legionella antigen, urine [[id number]][[id number]] Collected: 04/08/22 0139    Order Status: Sent Specimen: Urine, Clean Catch Updated: 04/08/22 0944    Culture Blood Aerobic and Anaerobic [[id number]][[id number]] Collected: 04/07/22 2326    Order Status: Sent  Specimen: Blood, Venipuncture Updated: 04/08/22 0551    Narrative:      The order will result in two separate 8-78ml bottles  Please do NOT order repeat blood cultures if  one has been  drawn within the last 48 hours  UNLESS concerned for  endocarditis  AVOID BLOOD CULTURE DRAWS FROM CENTRAL LINE IF POSSIBLE  Indications:->Sepsis  Indications:->Pneumonia  1 BLUE+1 PURPLE    Culture Blood Aerobic and Anaerobic [[id number]] Collected: 04/07/22 2130    Order Status: Sent Specimen: Blood, Venipuncture Updated: 04/07/22 2357    Narrative:      The order will result in two separate 8-62ml bottles  Please do NOT order repeat blood cultures if one has been  drawn within the last 48 hours  UNLESS concerned for  endocarditis  AVOID BLOOD CULTURE DRAWS FROM CENTRAL LINE IF POSSIBLE  Indications:->Sepsis  Indications:->Pneumonia  1 BLUE+1 PURPLE    COVID-19 (SARS-CoV-2) and Influenza A/B, NAA ([facility] Rapid) [[id number]] Collected: 04/07/22 0900    Order Status: Completed Specimen: Culturette from Nasopharyngeal Updated: 04/07/22 0925     Purpose of COVID testing Diagnostic -PUI     SARS-CoV-2 Specimen Source Nasal Swab     SARS CoV 2 Overall Result Not Detected     Comment: __________________________________________________  -A result of "Detected" indicates POSITIVE for the    presence of SARS CoV-2 RNA  -A result of "Not Detected" indicates NEGATIVE for the    presence of SARS CoV-2 RNA  __________________________________________________________  Test performed using the Roche cobas Liat SARS-CoV-2 assay. This assay is  only for use under the Food and Drug Administration's Emergency Use  Authorization. This is a real-time RT-PCR assay for the qualitative  detection of SARS-CoV-2 RNA. Viral nucleic acids may persist in vivo,  independent of viability. Detection of viral nucleic acid does not imply the  presence of infectious virus, or that virus nucleic acid is the cause of  clinical symptoms. Negative results do not preclude SARS-CoV-2 infection and  should not be used as the sole basis for diagnosis, treatment or other  patient management decisions. Negative results must be combined with  clinical  observations, patient history, and/or epidemiological information.  Invalid results may be due to inhibiting substances in the specimen and  recollection should occur. Please see Fact Sheets for patients and providers  located:  WirelessDSLBlog.no          Influenza A Not Detected     Influenza B Not Detected     Comment: Test performed using the Roche cobas Liat SARS-CoV-2 & Influenza A/B assay.  This assay is only for use under the Food and Drug Administration's  Emergency Use Authorization. This is a multiplex real-time RT-PCR assay  intended for the simultaneous in vitro qualitative detection and  differentiation of SARS-CoV-2, influenza A, and influenza B virus RNA. Viral  nucleic acids may persist in vivo, independent of viability. Detection of  viral nucleic acid does not imply the presence of infectious virus, or that  virus nucleic acid is the cause of clinical symptoms. Negative results do  not preclude SARS-CoV-2, influenza A, and/or influenza B infection and  should not be used as the sole basis for diagnosis, treatment or other  patient management decisions. Negative results must be combined with  clinical observations, patient history, and/or epidemiological information.  Invalid results may be due to inhibiting substances in the specimen and  recollection should occur. Please see Fact Sheets for patients and providers  located: http://www.rice.biz/.  Narrative:      o Collect and clearly label specimen type:  o PREFERRED-Upper respiratory specimen: One Nasal Swab in  Transport Media.  o Hand deliver to laboratory ASAP  Diagnostic -PUI             No data recorded  RADIOLOGY     Radiological Procedure xray personally reviewed and concur with radiologist reports unless stated otherwise.    No orders to display       Signed,  Denny Levy, PA-C  12:57 PM 04/08/2022     This note was generated by the [facility] EMR system/Dragon speech recognition and may  contain inherent errors or omissions not intended by the user. Grammatical errors, random word insertions, deletions, pronoun errors and incomplete sentences are occasional consequences of this technology due to software limitations. Not all errors are caught or corrected. If there are questions or concerns about the content of this note or information contained within the body of this dictation they should be addressed directly with the author for clarification.

## 2022-04-08 NOTE — Plan of Care (Signed)
Admission Note-  Patient admitted to PCU #  [id number] at 2055. Hand off report received from [facility] ([facility])   Patient connected to tele monitor, confirmed with monitor tech.   Patient oriented to room, bed in lowest position, bed alarm activated, call bell within reach.   Patient informed of visitor policy.  Belongings at bedside.     Brief Clinical Picture:  Neuro AXO4; Resp - Room Air, SOB on extertion, Wet cough, 94-96% O2 on Room Air, ; Cardiac <<Tachycardia 100s-120s, spikes into 130s upon exertion>>; Standby Assist; Tattoo on Left upper arm and flaking/cracking on feet.      Skin Assessment  Two nurse skin assessment performed with: Silvia CT    Areas observed with redness or injury: none    Problem: Safety  Goal: Patient will be free from injury during hospitalization  Outcome: Progressing  Flowsheets (Taken 04/08/2022 0210)  Patient will be free from injury during hospitalization:   Assess patient's risk for falls and implement fall prevention plan of care per policy   Provide and maintain safe environment   Use appropriate transfer methods   Ensure appropriate safety devices are available at the bedside   Include patient/ family/ care giver in decisions related to safety   Hourly rounding   Assess for patients risk for elopement and implement Elopement Risk Plan per policy  Goal: Patient will be free from infection during hospitalization  Outcome: Progressing  Flowsheets (Taken 04/08/2022 0210)  Free from Infection during hospitalization:   Assess and monitor for signs and symptoms of infection   Monitor lab/diagnostic results   Monitor all insertion sites (i.e. indwelling lines, tubes, urinary catheters, and drains)   Encourage patient and family to use good hand hygiene technique     Problem: Pain  Goal: Pain at adequate level as identified by patient  Outcome: Progressing  Flowsheets (Taken 04/08/2022 0210)  Pain at adequate level as identified by patient:   Identify patient comfort function goal   Assess for risk of  opioid induced respiratory depression, including snoring/sleep apnea. Alert healthcare team of risk factors identified.   Assess pain on admission, during daily assessment and/or before any "as needed" intervention(s)   Reassess pain within 30-60 minutes of any procedure/intervention, per Pain Assessment, Intervention, Reassessment (AIR) Cycle   Evaluate if patient comfort function goal is met   Evaluate patient's satisfaction with pain management progress   Offer non-pharmacological pain management interventions     Problem: Compromised Hemodynamic Status  Goal: Vital signs and fluid balance maintained/improved  Outcome: Progressing  Flowsheets (Taken 04/08/2022 0210)  Vital signs and fluid balance are maintained/improved:   Position patient for maximum circulation/cardiac output   Monitor/assess vitals and hemodynamic parameters with position changes   Monitor and compare daily weight   Monitor intake and output. Notify LIP if urine output is less than 30 mL/hour.   Monitor/assess lab values and report abnormal values     Problem: Inadequate Gas Exchange  Goal: Adequate oxygenation and improved ventilation  Outcome: Progressing  Flowsheets (Taken 04/08/2022 0210)  Adequate oxygenation and improved ventilation:   Assess lung sounds   Monitor SpO2 and treat as needed   Monitor and treat ETCO2   Provide mechanical and oxygen support to facilitate gas exchange   Position for maximum ventilatory efficiency   Teach/reinforce use of incentive spirometer 10 times per hour while awake, cough and deep breath as needed   Plan activities to conserve energy: plan rest periods   Increase activity as tolerated/progressive mobility  Consult/collaborate with Respiratory Therapy  Goal: Patent Airway maintained  Outcome: Progressing  Flowsheets (Taken 04/08/2022 0210)  Patent airway maintained:   Position patient for maximum ventilatory efficiency   Provide adequate fluid intake to liquefy secretions   Suction secretions as needed      Problem: Inadequate Airway Clearance  Goal: Normal respiratory rate/effort achieved/maintained  Outcome: Progressing  Flowsheets (Taken 04/08/2022 0210)  Normal respiratory rate/effort achieved/maintained: Plan activities to conserve energy: plan rest periods     Problem: Inadequate Tissue Perfusion-Venous  Goal: Tissue perfusion is adequate-venous  Outcome: Progressing  Flowsheets (Taken 04/08/2022 0210)  Tissue perfusion is adequate-venous:   Increase activity as tolerated / progressive mobility   Elevate feet when in chair   Teach/review/reinforce ankle pump exercises   VTE  prevention: Administer anticoagulant(s) and/or apply anti-embolism stockings/devices as ordered     Problem: Impaired Mobility  Goal: Mobility/Activity is maintained at optimal level for patient  Outcome: Progressing  Flowsheets (Taken 04/08/2022 0210)  Mobility/activity is maintained at optimal level for patient:   Increase mobility as tolerated/progressive mobility   Encourage independent activity per ability   Maintain proper body alignment   Perform active/passive ROM   Plan activities to conserve energy, plan rest periods   Reposition patient every 2 hours and as needed unless able to reposition self   Assess for changes in respiratory status, level of consciousness and/or development of fatigue     Problem: Fluid and Electrolyte Imbalance/ Endocrine  Goal: Fluid and electrolyte balance are achieved/maintained  Outcome: Progressing  Flowsheets (Taken 04/08/2022 0210)  Fluid and electrolyte balance are achieved/maintained:   Monitor intake and output every shift   Monitor/assess lab values and report abnormal values   Provide adequate hydration   Assess for confusion/personality changes   Monitor daily weight   Assess and reassess fluid and electrolyte status  Goal: Adequate hydration  Outcome: Progressing  Flowsheets (Taken 04/08/2022 0210)  Adequate hydration:   Assess mucus membranes, skin color, turgor, perfusion and presence of edema    Assess for peripheral, sacral, periorbital and abdominal edema   Monitor and assess vital signs and perfusion

## 2022-04-08 NOTE — Progress Notes (Signed)
Respiratory Therapy Patient Assessment    [id number]  04/08/22 2:25 AM  RT: Dianara Smullen, RT      Admitting DX: COPD with acute exacerbation [J44.1]  COPD exacerbation [J44.1]    Pulmonary History: COPD    Other Pulm Hx:      Therapy ordered:     IP Meds - Nasal and Inhaled (From admission, onward)      Start     Stop Status Route Frequency Ordered    04/07/22 2315  albuterol-ipratropium (DUO-NEB) 2.5-0.5(3) mg/3 mL nebulizer 3 mL         -- Verified NEBULIZATION RT - Every 6 hours scheduled 04/07/22 2238               PT able to take deep breath? Yes               Airway: Natural   Mobility: Ambulatory with or without assistance  CXR:  (7/21 Normal CXR.)    Cough Effort: Strong            Can clear secretions with cough? Yes  Can clear secretions with suctioning? N/A     Social History     Tobacco Use   Smoking Status Some Days    Packs/day: 0.25    Years: 30.00    Total pack years: 7.50    Types: Cigarettes   Smokeless Tobacco Never   Tobacco Comments    a pack lasts several days        Breath Sounds:  Bilateral Breath Sounds: Expiratory wheezes          Heart Rate: (!) 115 Resp Rate: 18  SpO2: 100 % O2 Device: None (Room air)          Home regimen:              Criteria for therapy:        Medications: Hx of COPD, Asthma, Bronchitis or other documented RAD    Recommendations/Interventions:  Recommendations/Interventions: Bronchodilators     Expected Outcomes:        Meds: Decreased bronchospasms, Decreased freq of symptoms      Re-Evaluation:  Follow-up Date: 7/22  Improving with Therapy: N/A    Plan of Care Recommendations:  Plan of Care: Duo nebs q6h

## 2022-04-08 NOTE — UM Notes (Signed)
** This review is compiled from documentation provided by the treatment team within the patient's medical record. **     Janace Litten, RN, BSN  Clinical Case Manager - Utilization Review  [facility]  [address]  [place], [place] [address]  UR Main no: [phone]  Fax: [phone]  [email or web address]  System Tax ID [id number]  [facility]            NPI [id number]  [place]      NPI [id number]  Einar Gip        NPI [id number]  [place]         NPI [id number]  [place]     NPI [id number]        Please use fax number to provide Authorization or to request additional information.      DATE OF REVIEW: 7/21-7/22    Patient Name: [name]       DOB: 29-Dec-1955  MRN: [id number]    PATIENT CLASS:  Patient placed to Observation on 04/07/2022    Reason For Hospitalization:    Pt is a 65 y.o. female who presented as a direct admission from outside ED with sudden onset nonproductive wet cough and SOB.      PAST MEDICAL HISTORY:   has a past medical history of Abnormal vision, Anxiety, Arthritis, Carpal tunnel syndrome on both sides, Difficulty walking, Eczema, Fracture, Gastroesophageal reflux disease, History of gonorrhea, Hypertensive disorder, Lower back pain, Neck pain, Neuropathy, Pneumonia, and Vein disorder.    ED TRIAGE VITALS:  ED Triage Vitals   Enc Vitals Group      BP 04/07/22 1900 127/76      Heart Rate 04/07/22 1900 (!) 109      Resp Rate 04/07/22 1900 22      Temp 04/07/22 1900 100 F (37.8 C)      Temp Source 04/07/22 1900 Oral      SpO2 04/07/22 1900 94 %      Weight 04/07/22 2308 91.3 kg (201 lb 4.5 oz)      Height 04/07/22 2308 1.524 m (5')      Head Circumference --       Peak Flow --       Pain Score 04/07/22 2100 8      Pain Loc --       Pain Edu? --       Excl. in GC? --        ED MEDICATIONS:  ED Medication Orders (From admission, onward)      Start Ordered     Status Ordering Provider    04/08/22 0900 04/07/22 2103  losartan (COZAAR) 100 mg,  hydroCHLOROthiazide (HYDRODIURIL) 12.5 mg for HYZAAR  Daily        Route: Oral       Last MAR action: Given WELDETSADIK, ABAYNEH E    04/08/22 0900 04/07/22 2103  vitamin D (cholecalciferol) tablet 25 mcg  Daily        Route: Oral  Ordered Dose: 1 tablet       Last MAR action: Given WELDETSADIK, ABAYNEH E    04/08/22 0900 04/07/22 2103  enoxaparin (LOVENOX) syringe 40 mg  Daily        Route: Subcutaneous  Ordered Dose: 40 mg       Last MAR action: Given WELDETSADIK, ABAYNEH E    04/08/22 0800 04/07/22 2209    Every morning with breakfast        Route: Oral  Ordered Dose: 40 mg       Discontinued WELDETSADIK, ABAYNEH E    04/08/22 0200 04/07/22 2230    RT - Every 6 hours scheduled        Route: Nebulization  Ordered Dose: 0.5 mg       Discontinued WELDETSADIK, ABAYNEH E    04/08/22 0000 04/07/22 2208    RT - Every 6 hours scheduled        Route: Nebulization  Ordered Dose: 2.5 mg       Discontinued WELDETSADIK, ABAYNEH E    04/08/22 0000 04/07/22 2323  famotidine (PEPCID) tablet 20 mg  Every 12 hours scheduled        Route: Oral  Ordered Dose: 20 mg       Last MAR action: Given WELDETSADIK, ABAYNEH E    04/07/22 2330 04/07/22 2323  nicotine (NICODERM CQ) 7 MG/24HR patch 1 patch  Daily        Route: Transdermal  Ordered Dose: 1 patch       Last MAR action: Not Given WELDETSADIK, ABAYNEH E    04/07/22 2315 04/07/22 2238  albuterol-ipratropium (DUO-NEB) 2.5-0.5(3) mg/3 mL nebulizer 3 mL  RT - Every 6 hours scheduled        Route: Nebulization  Ordered Dose: 3 mL       Last MAR action: Given WELDETSADIK, ABAYNEH E    04/07/22 2300 04/07/22 2228  methylPREDNISolone sodium succinate (Solu-MEDROL) injection 40 mg  2 times daily        Route: Intravenous  Ordered Dose: 40 mg       Last MAR action: Given WELDETSADIK, ABAYNEH E    04/07/22 2115 04/07/22 2031  sodium chloride 0.9 % bolus 1,778 mL  Once        Route: Intravenous  Ordered Dose: 20 mL/kg       Last MAR action: Stopped WELDETSADIK, ABAYNEH E    04/07/22 2115  04/07/22 2103  atorvastatin (LIPITOR) tablet 20 mg  Daily        Route: Oral  Ordered Dose: 20 mg       Last MAR action: Given WELDETSADIK, ABAYNEH E    04/07/22 2102 04/07/22 2103  acetaminophen (TYLENOL) tablet 650 mg  Every 6 hours PRN        Route: Oral  Ordered Dose: 650 mg      See Hyperspace for full Linked Orders Report.    Acknowledged Hunt Oris E    04/07/22 2102 04/07/22 2103  acetaminophen (TYLENOL) suppository 650 mg  Every 6 hours PRN        Route: Rectal  Ordered Dose: 650 mg      See Hyperspace for full Linked Orders Report.    Acknowledged Hunt Oris E    04/07/22 2102 04/07/22 2103  ondansetron (ZOFRAN-ODT) disintegrating tablet 4 mg  Every 6 hours PRN        Route: Oral  Ordered Dose: 4 mg      See Hyperspace for full Linked Orders Report.    Acknowledged Hunt Oris E    04/07/22 2102 04/07/22 2103  ondansetron (ZOFRAN) injection 4 mg  Every 6 hours PRN        Route: Intravenous  Ordered Dose: 4 mg      See Hyperspace for full Linked Orders Report.    Acknowledged Hunt Oris E    04/07/22 2100 04/07/22 2023  cefTRIAXone (ROCEPHIN) 1 g in sterile water (preservative free) 10 mL IV push injection  Every 24 hours  Route: Intravenous  Ordered Dose: 1 g      See Hyperspace for full Linked Orders Report.    Last MAR action: Given Hunt Oris E    04/07/22 2100 04/07/22 2023  azithromycin (ZITHROMAX) 500 mg in sodium chloride 0.9 % 250 mL IVPB  Every 24 hours        Route: Intravenous  Ordered Dose: 500 mg      See Hyperspace for full Linked Orders Report.    Last MAR action: Stopped Hunt Oris E    04/07/22 2100 04/07/22 2023    Once        Route: Intravenous  Ordered Dose: 30 mL/kg       Discontinued WELDETSADIK, ABAYNEH E    04/07/22 2056 04/07/22 2103  naloxone (NARCAN) injection 0.2 mg  As needed        Route: Intravenous  Ordered Dose: 0.2 mg       Acknowledged Pecola Lawless, ABAYNEH E    04/07/22 2056 04/07/22 2103  dextrose (GLUCOSE)  40 % oral gel 15 g of glucose  As needed        Route: Oral  Ordered Dose: 15 g of glucose      See Hyperspace for full Linked Orders Report.    Acknowledged Hunt Oris E    04/07/22 2056 04/07/22 2103  dextrose (D10W) 10% bolus 125 mL  As needed        Route: Intravenous  Ordered Dose: 12.5 g      See Hyperspace for full Linked Orders Report.    Acknowledged Hunt Oris E    04/07/22 2056 04/07/22 2103  dextrose 50 % bolus 12.5 g  As needed        Route: Intravenous  Ordered Dose: 12.5 g      See Hyperspace for full Linked Orders Report.    Acknowledged Hunt Oris E    04/07/22 2056 04/07/22 2103  glucagon (rDNA) (GLUCAGEN) injection 1 mg  As needed        Route: Intramuscular  Ordered Dose: 1 mg      See Hyperspace for full Linked Orders Report.    Acknowledged Hunt Oris E    04/07/22 2056 04/07/22 2103  melatonin tablet 3 mg  At bedtime PRN        Route: Oral  Ordered Dose: 3 mg       Acknowledged Hunt Oris E    04/07/22 2047 04/07/22 2047  guaiFENesin-codeine (ROBITUSSIN AC) 100-10 MG/5ML syrup 5 mL  Every 6 hours PRN        Route: Oral  Ordered Dose: 5 mL       Last MAR action: Given WELDETSADIK, ABAYNEH E            CT Chest without Contrast    Result Date: 04/07/2022  Mild small airways wall thickening in the left lower lobe, infectious or inflammatory bronchitis/bronchiolitis. No consolidation. Stable 4 mm nodules compared to June exam. Per Fleischner Society guidelines, for nodule of this size: in a low risk patient, no follow-up needed. In a high-risk patient, twelve-month follow-up is considered optional. Linna Caprice, MD 04/07/2022 6:35 PM    XR Chest 2 Views    Result Date: 04/07/2022  Stable exam. No detectable active cardiopulmonary disease. Wilmon Pali, MD 04/07/2022 9:14 AM     ASSESSMENT & PLAN      Christia Domke is a 66 y.o. female admitted under observation for a sudden new onset cough and shortness of breath.  Smokes 2  to 3 cigarettes a day  since she was 66 years old.  Recently diagnosed with COPD by her PCP.  Denies any previous hospitalization due to COPD nor similar symptoms.      #Sepsis present on admission likely due to possible community-acquired pneumonia  #Dyspnea on exertion likely due to bronchitis/bronchiolitis and possible acute on chronic COPD exacerbation with possible CAP versus underlying viral infection VS possible viral illness  #Leukocytosis and lactic acidosis likely due to above  --WBC 13.4 and initial lactic acid 3.3.    --CT chest without contrast noted mild small airway wall thickening in the left lower lobe, infectious or inflammatory bronchitis/bronchiolitis  --Sepsis work-up initiated. Patient received 1 L of IV bolus at [facility] and the remaining of bolus given here to make up 26ml/kg bolus per sepsis protocol. She also received a dose of Augmentin in [place] [facility].   --Started on IV azithromycin and ceftriaxone for atypical pneumonia coverage.    --Follow-up blood culture, sputum culture and viral panel  --Started on antitussive-Robitussin with codeine as needed  -- DuoNeb every 6 hrs  --Started Solu-Medrol IV 40 mg twice daily.  The patient received a dose of 40 mg of prednisone at [facility].  --D-dimer, BNP and troponin pending  --Trend lactic acid     #Stable 4 mm fissural based nodule in the left lower lobe on CT scan.  --Given patient's long history of smoking please advised the patient to follow-up with her PCP for monitor.     #Tobacco use disorder  -- Smoker since 66 years old.  Tobacco cessation counseling.  --Started nicotine patch.     #Hypertension, #hyperlipidemia  --Continue home medication     #GERD  --Pepcid     FEN:  Fluid: SL  Electrolytes: note  Nutrition: Heart healthy diet     Latest Reference Range & Units 04/07/22 21:30   WBC 3.10 - 9.50 x10 3/uL 13.63 (H)   (H): Data is abnormally high   Latest Reference Range & Units 04/07/22 15:11   Lactic Acid 0.2 - 2.0 mmol/L 3.3 (H)   (H): Data is abnormally high    Latest Reference Range & Units 04/07/22 23:26   hs Troponin-I SEE BELOW ng/L 14.9 !   !: Data is abnormal    =============Day 2 7/22 Patient on telemetry unit as OBS============    -Continued IV abx, IV steroid   -Patient remains tachycardic  -Vital signs  -Follow cultures   -Follow blood cultures, troponin    Scheduled Meds:  Current Facility-Administered Medications   Medication Dose Route Frequency    albuterol-ipratropium  3 mL Nebulization Q6H SCH    atorvastatin  20 mg Oral Daily    cefTRIAXone  1 g Intravenous Q24H    And    azithromycin  500 mg Intravenous Q24H    enoxaparin  40 mg Subcutaneous Daily    famotidine  20 mg Oral Q12H SCH    losartan (COZAAR) 100 mg, hydroCHLOROthiazide (HYDRODIURIL) 12.5 mg for HYZAAR   Oral Daily    methylPREDNISolone  40 mg Intravenous BID    nicotine  1 patch Transdermal Daily    vitamin D  1 tablet Oral Daily     Continuous Infusions:  PRN Meds:.acetaminophen **OR** acetaminophen, dextrose **OR** dextrose **OR** dextrose **OR** glucagon (rDNA), guaiFENesin-codeine, melatonin, naloxone, ondansetron **OR** ondansetron    Vitals:    04/07/22 2345 04/08/22 0415 04/08/22 0805 04/08/22 1200   BP: 144/82 137/84 139/85 140/78   Pulse: (!) 115 (!) 119 (!) 117 Marland Kitchen)  103   Resp: 18 20 16 16    Temp: 99.1 F (37.3 C) 98.8 F (37.1 C) 98.1 F (36.7 C) 98.1 F (36.7 C)   TempSrc: Oral Oral Oral Oral   SpO2: 100% 93% 94% 94%   Weight:       Height:          Latest Reference Range & Units 04/08/22 04:59   WBC 3.10 - 9.50 x10 3/uL 11.00 (H)   (H): Data is abnormally high

## 2022-04-09 LAB — BASIC METABOLIC PANEL
Anion Gap: 8 (ref 5.0–15.0)
BUN: 13 mg/dL (ref 7.0–21.0)
CO2: 24 mEq/L (ref 17–29)
Calcium: 9.7 mg/dL (ref 8.5–10.5)
Chloride: 108 mEq/L (ref 99–111)
Creatinine: 0.8 mg/dL (ref 0.4–1.0)
Glucose: 143 mg/dL — ABNORMAL HIGH (ref 70–100)
Potassium: 4.3 mEq/L (ref 3.5–5.3)
Sodium: 140 mEq/L (ref 135–145)
eGFR: >60 mL/min/{1.73_m2} (ref >=60)

## 2022-04-09 LAB — CBC AND DIFFERENTIAL
Absolute NRBC: 0 10*3/uL (ref 0.00–0.00)
Basophils Absolute Automated: 0.01 10*3/uL (ref 0.00–0.08)
Basophils Automated: 0.1 %
Eosinophils Absolute Automated: 0 10*3/uL (ref 0.00–0.44)
Eosinophils Automated: 0 %
Hematocrit: 38.6 % (ref 34.7–43.7)
Hgb: 12.3 g/dL (ref 11.4–14.8)
Immature Granulocytes Absolute: 0.11 10*3/uL — ABNORMAL HIGH (ref 0.00–0.07)
Immature Granulocytes: 1 %
Instrument Absolute Neutrophil Count: 9.3 10*3/uL — ABNORMAL HIGH (ref 1.10–6.33)
Lymphocytes Absolute Automated: 0.86 10*3/uL (ref 0.42–3.22)
Lymphocytes Automated: 8 %
MCH: 30 pg (ref 25.1–33.5)
MCHC: 31.9 g/dL (ref 31.5–35.8)
MCV: 94.1 fL (ref 78.0–96.0)
MPV: 11.6 fL (ref 8.9–12.5)
Monocytes Absolute Automated: 0.52 10*3/uL (ref 0.21–0.85)
Monocytes: 4.8 %
Neutrophils Absolute: 9.3 10*3/uL — ABNORMAL HIGH (ref 1.10–6.33)
Neutrophils: 86.1 %
Nucleated RBC: 0 /100 WBC (ref 0.0–0.0)
Platelets: 222 10*3/uL (ref 142–346)
RBC: 4.1 10*6/uL (ref 3.90–5.10)
RDW: 14 % (ref 11–15)
WBC: 10.8 10*3/uL — ABNORMAL HIGH (ref 3.10–9.50)

## 2022-04-09 LAB — PROCALCITONIN: Procalcitonin: <0.02 ng/ml (ref 0.00–0.10)

## 2022-04-09 LAB — HIGH SENSITIVITY TROPONIN-I: hs Troponin-I: 3.5 ng/L

## 2022-04-09 MED ORDER — ALBUTEROL-IPRATROPIUM 2.5-0.5 (3) MG/3ML IN SOLN
3.0000 mL | RESPIRATORY_TRACT | Status: DC
Start: 2022-04-09 — End: 2022-04-11
  Administered 2022-04-09 – 2022-04-11 (×12): 3 mL via RESPIRATORY_TRACT
  Filled 2022-04-09 (×12): qty 3

## 2022-04-09 MED ORDER — IBUPROFEN 400 MG PO TABS
400.0000 mg | ORAL_TABLET | Freq: Four times a day (QID) | ORAL | Status: DC | PRN
Start: 2022-04-09 — End: 2022-04-11
  Administered 2022-04-09: 400 mg via ORAL
  Filled 2022-04-09: qty 1

## 2022-04-09 MED ORDER — METHYLPREDNISOLONE SODIUM SUCC 40 MG IJ SOLR (WRAP)
40.0000 mg | Freq: Three times a day (TID) | INTRAMUSCULAR | Status: DC
Start: 2022-04-09 — End: 2022-04-10
  Administered 2022-04-09 (×2): 40 mg via INTRAVENOUS
  Filled 2022-04-09 (×2): qty 1

## 2022-04-09 NOTE — Plan of Care (Signed)
Problem: Safety  Goal: Patient will be free from injury during hospitalization  Outcome: Progressing  Flowsheets (Taken 04/09/2022 0219)  Patient will be free from injury during hospitalization:   Assess patient's risk for falls and implement fall prevention plan of care per policy   Provide and maintain safe environment   Ensure appropriate safety devices are available at the bedside   Use appropriate transfer methods   Include patient/ family/ care giver in decisions related to safety   Hourly rounding   Assess for patients risk for elopement and implement Elopement Risk Plan per policy   Provide alternative method of communication if needed (communication boards, writing)     Problem: Compromised Hemodynamic Status  Goal: Vital signs and fluid balance maintained/improved  Outcome: Progressing  Flowsheets (Taken 04/09/2022 0219)  Vital signs and fluid balance are maintained/improved:   Position patient for maximum circulation/cardiac output   Monitor and compare daily weight   Monitor/assess lab values and report abnormal values   Monitor intake and output. Notify LIP if urine output is less than 30 mL/hour.   Monitor/assess vitals and hemodynamic parameters with position changes     Problem: Inadequate Gas Exchange  Goal: Adequate oxygenation and improved ventilation  Outcome: Progressing  Flowsheets (Taken 04/09/2022 2301)  Adequate oxygenation and improved ventilation:   Assess lung sounds   Monitor SpO2 and treat as needed   Monitor and treat ETCO2   Position for maximum ventilatory efficiency   Provide mechanical and oxygen support to facilitate gas exchange   Plan activities to conserve energy: plan rest periods   Increase activity as tolerated/progressive mobility   Consult/collaborate with Respiratory Therapy

## 2022-04-09 NOTE — Progress Notes (Signed)
[facility]  HOSPITALIST  PROGRESS NOTE      Patient: [name]  Date: 04/09/2022   LOS: 0 Days  Admission Date: 04/07/2022   MRN: [id number]  Attending: Garlan Fillers, MD     ASSESSMENT/PLAN     [name] is a 66 y.o. female admitted with      Sepsis present on admission likely due to possible CAP versus bronchitis/bronchiolitis due to + rhinovirus and acute COPD exacerbation  Evidenced by lactic acidosis, leukocytosis, suspected infection.  CT chest noting inflammation versus infection LLL.  COVID-19 and influenza A/B negative.  Positive for rhinovirus.  D-dimer negative.  Received IVF resuscitation in ED, completed with resolution of lactic acidosis.  Prelim blood cultures x2 negative. Sputum culture unrevealing. ,Continue IV Rocephin and azithromycin, day #3. Will chest PCT to eval for bacterial component, may be all viral related.Due to continued coarse wheezing will increase frequency of IV solumedrol and duonebs today, and titrate as able.  Continue supportive measures with antitussives, antipyretics.  Continues to oxygenate well on room air.  Continue droplet precautions.  If no significant improvement with medication adjustments consider pulmonary evaluation in a.m., will need to establish care with pulmonary outpatient for further work-up of suspected underlying undiagnosed COPD with longtime tobacco use.    Stable 4 mm lung nodule on CT imaging  Advise close follow-up for monitoring.    Tobacco abuse  Reports wanting to quit tobacco use, offered nicotine patch and declined at this time.    Hypertension, hyperlipidemia  Continue atorvastatin, losartan, hydrochlorothiazide.    GERD  Continue Pepcid.    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    Sinus tachycardia  Suspected in setting of #1, continue to monitor on telemetry.        Nutrition: Cardiac Diet    Safety Checklist  DVT prophylaxis: Chemical   Foley: Not present   IVs:  Peripheral IV   PT/OT: Not needed   Daily CBC &  or Chem ordered: Yes, due to clinical and lab instability       Patient Lines/Drains/Airways Status       Active PICC Line / CVC Line / PIV Line / Drain / Airway / Intraosseous Line / Epidural Line / ART Line / Line / Wound / Pressure Ulcer / NG/OG Tube       Name Placement date Placement time Site Days    Peripheral IV 04/07/22 Distal;Posterior;Right Forearm 04/07/22  2000  Forearm  less than 1                         Code Status: full code    DISPO: home with family    Family Contact: See chart    Care Plan discussed with nursing, consultants, case manager.       SUBJECTIVE     [name] reports slow to improve continues to have shortness of breath when wheezing, nonproductive cough..      ROS     Remainder of 10 point ROS as above or otherwise negative    PHYSICAL EXAM     Vitals:    04/09/22 0808   BP: 132/81   Pulse: (!) 101   Resp: 20   Temp: 97.7 F (36.5 C)   SpO2: 95%       Temperature: Temp  Min: 97.7 F (36.5 C)  Max: 99 F (37.2 C)  Pulse: Pulse  Min: 93  Max: 104  Respiratory: Resp  Min: 16  Max: 20  Non-Invasive BP: BP  Min: 132/81  Max: 159/93  Pulse Oximetry SpO2  Min: 94 %  Max: 98 %    Intake and Output Summary (Last 24 hours) at Date Time  No intake or output data in the 24 hours ending 04/09/22 1139    General: Awake and alert. No acute distress.  Obese.  ENT: Pupils equal and reactive.   Neck: Supple, FROM.  Cardiovascular: RRR. No murmurs.   Pulmonary: On room air.  No respiratory distress.  Dry coarse cough.  Coarse wheezing bilaterally.  Abdomen: Soft, nontender, nondistended. Normoactive BS.   Extremities: No edema. MAEWx4.  Skin: No rashes or lesions noted.   Neuro: No focal neurological deficits. Alert and oriented x3.  Psych: Mood and affect appropriate.     MEDICATIONS     Current Facility-Administered Medications   Medication Dose Route Frequency    albuterol-ipratropium  3 mL Nebulization Q4H SCH    atorvastatin  20 mg Oral Daily    cefTRIAXone  1 g Intravenous Q24H    And     azithromycin  500 mg Intravenous Q24H    enoxaparin  40 mg Subcutaneous Daily    famotidine  20 mg Oral Q12H SCH    losartan (COZAAR) 100 mg, hydroCHLOROthiazide (HYDRODIURIL) 12.5 mg for HYZAAR   Oral Daily    methylPREDNISolone  40 mg Intravenous TID    nicotine  1 patch Transdermal Daily    vitamin D  1 tablet Oral Daily       LABS     Recent Labs   Lab 04/09/22  0503 04/08/22  0459 04/07/22  2130   WBC 10.80* 11.00* 13.63*   RBC 4.10 4.00 4.20   Hgb 12.3 12.1 12.5   Hematocrit 38.6 37.6 38.7   MCV 94.1 94.0 92.1   Platelets 222 221 215         Recent Labs   Lab 04/09/22  0503 04/08/22  0459 04/07/22  2130 04/07/22  1010   Sodium 140 142 140 142   Potassium 4.3 4.1 3.9 4.2   Chloride 108 111 110 107   CO2 24 22 20 22    BUN 13.0 8.0 11.0 9.0   Creatinine 0.8 0.7 0.8 0.8   Glucose 143* 134* 88 111*   Calcium 9.7 9.4 9.3 9.9         Recent Labs   Lab 04/07/22  2130 04/07/22  1010   ALT 14 15   AST (SGOT) 16 16   Bilirubin, Total 0.3 0.4   Albumin 3.5 3.9   Alkaline Phosphatase 70 77         Recent Labs   Lab 04/09/22  0503 04/07/22  2326   hs Troponin-I 3.5 14.9*               Microbiology Results (last 15 days)       Procedure Component Value Units Date/Time    CULTURE + Dierdre Forth [[id number]] Collected: 04/08/22 0865    Order Status: Completed Specimen: Sputum, Expectorated Updated: 04/08/22 1656    Narrative:      Culture and Gram Stain, Aerobic, Respiratory was cancelled on 04/08/22 at  16:56 by [id number]; Specimen quality is inadequate for culture  Specimen appears  to be saliva  Please submit another sputum specimen  Induction of sputum may  improve specimen quality  ORDER#: [id number]  ORDERED BY: WELDETSADIK, AB  SOURCE: Sputum, Expectorated respiratory             COLLECTED:  04/08/22 08:22  ANTIBIOTICS AT COLL.:                                RECEIVED :  04/08/22 15:14  Culture and Gram Stain, Aerobic, Respiratory was cancelled on 04/08/22 at 16:56 by  [id number]; Specimen quality is inadequate for  culture  Specimen appears to be saliva  Please submit another sputum specimen  Induction of sputum may improve specimen quality  Stain, Gram (Respiratory)                  FINAL       04/08/22 16:56  04/08/22   Smear contains > or = 10 squamous epithelial cells per low             power field, suggestive of significant oral contamination and             poor quality specimen. Culture not performed.             Please recollect if clinically indicated.  Culture and Gram Stain, Aerobic, RespiratorCANCELLED   04/08/22 16:56            - cancelled on 04/08/22 16:56   by  [id number]   Specimen quality is inadequate for culture  Specimen appears to be saliva   Please submit another sputum specimen  Induction of sputum may improve specimen   quality      COVID-19 (SARS-CoV-2) only ([facility] Rapid) asymptomatic admission [[id number]] Collected: 04/08/22 0431    Order Status: Canceled Specimen: Nasopharyngeal     Respiratory Pathogen Panel w/COVID-19, PCR [[id number]]  (Abnormal) Collected: 04/08/22 0431    Order Status: Completed Specimen: Nasopharyngeal Updated: 04/08/22 1610     Adenovirus Not Detected     Coronavirus 229E Not Detected     Coronavirus HKU1 Not Detected     Coronavirus NL63 Not Detected     Coronavirus OC43 Not Detected     SARS CoV-2 RNA Not Detected     Human Metapneumovirus Not Detected     Human Rhinovirus/Enterovirus Detected     Influenza A Not Detected     Influenza B Not Detected     Parainfluenza Virus 1 Not Detected     Parainfluenza Virus 2 Not Detected     Parainfluenza Virus 3 Not Detected     Parainfluenza Virus 4 Not Detected     Respiratory Syncytial Virus Not Detected     Bordetella pertussis Not Detected     Bordetella parapertussis PCR Not Detected     Chlamydophila pneumoniae Not Detected     Mycoplasma pneumoniae Not Detected     Source NP Swab     Test Comment for Respiratory Panel with COVID See Below     Comment: Testing performed using the [employer]  FilmArray  Respiratory Panel (RP 2.1)  This test is for the qualitative detection of  respiratory pathogen nucleic acid. This assay cannot  differentiate between Rhinovirus/Enterovirus. If  necessary for patient care, a positive result for  Rhinovirus/Enterovirus may be followed-up using an  alternate method. This assay should not be used if  B. pertussis infection is specifically suspected as it  is less sensitive than other alternatives. If suspected,  ensure that B. pertussis specific testing is ordered.  Recent administration of a nasal influenza vaccine may  cause false positive results for Influenza A and/or  Influenza B.  Viral and bacterial nucleic acids may persist even  though no viable organism is present. Detection of  nucleic acid does not imply that the corresponding  organisms are infectious or are the causative agents of  clinical symptoms. Positive results of this test do not  rule out coinfection with other organisms. A negative  result does not exclude the possibility of viral or  bacterial infection.  Assay performance characteristics  may vary with circulating strains and this assay may not  be able to distinguish between existing viral strains  and new variants as they emerge. Results of this test  should not be used as the sole basis for diagnosis,  treatment, or other patient management decisions.  This assay is FDA authorized for nasopharyngeal swab  samples.  Performance characteristics for  Bronchoalveolar lavage samples have been determined by  the [facility] laboratory. Other sample types are unacceptable.  Invalid results may be due to inhibiting substances in  the specimen and recollection should occur.          Purpose of COVID testing Diagnostic -PUI    Narrative:      For NP, Call laboratory for VTM NP Swab collection device.  Bronchoalveolar Lavage is submitted in a sterile container.  Diagnostic -PUI    Respiratory Pathogen Panel w/COVID-19, PCR [[id number]] Collected: 04/08/22 0248     Order Status: Canceled Specimen: Nasopharyngeal     Legionella antigen, urine [[id number]] Collected: 04/08/22 0139    Order Status: Completed Specimen: Urine, Clean Catch Updated: 04/08/22 1335    Narrative:      ORDER#: [id number]                                    ORDERED BY: Pecola Lawless, AB  SOURCE: Urine, Clean Catch                           COLLECTED:  04/08/22 01:39  ANTIBIOTICS AT COLL.:                                RECEIVED :  04/08/22 09:44  Legionella, Rapid Urinary Antigen          FINAL       04/08/22 13:35  04/08/22   Negative for Legionella pneumophila Serogroup 1 Antigen             Limitations of Test:             1. Negative results do not exclude infection with Legionella                pneumophila Serogroup 1.             2. Does not detect other serogroups of L. pneumophila                or other Legionella species.             Test Reference Range: Negative      Culture Blood Aerobic and Anaerobic [[id number]] Collected: 04/07/22 2326    Order Status: Completed Specimen: Blood, Venipuncture Updated: 04/09/22 6295    Narrative:      The order will result in two separate 8-64ml bottles  Please do NOT order repeat blood cultures if one has been  drawn  within the last 48 hours  UNLESS concerned for  endocarditis  AVOID BLOOD CULTURE DRAWS FROM CENTRAL LINE IF POSSIBLE  Indications:->Sepsis  Indications:->Pneumonia  ORDER#: [id number]                                    ORDERED BY: WELDETSADIK, AB  SOURCE: Blood, Venipuncture                          COLLECTED:  04/07/22 23:26  ANTIBIOTICS AT COLL.:                                RECEIVED :  04/08/22 05:51  Culture Blood Aerobic and Anaerobic        PRELIM      04/09/22 06:21  04/09/22   No Growth after 1 day/s of incubation.      Culture Blood Aerobic and Anaerobic [[id number]] Collected: 04/07/22 2130    Order Status: Completed Specimen: Blood, Venipuncture Updated: 04/09/22 0022    Narrative:      The order will result in two separate 8-84ml  bottles  Please do NOT order repeat blood cultures if one has been  drawn within the last 48 hours  UNLESS concerned for  endocarditis  AVOID BLOOD CULTURE DRAWS FROM CENTRAL LINE IF POSSIBLE  Indications:->Sepsis  Indications:->Pneumonia  ORDER#: [id number]                                    ORDERED BY: WELDETSADIK, AB  SOURCE: Blood, Venipuncture                          COLLECTED:  04/07/22 21:30  ANTIBIOTICS AT COLL.:                                RECEIVED :  04/07/22 23:57  Culture Blood Aerobic and Anaerobic        PRELIM      04/09/22 00:21  04/09/22   No Growth after 1 day/s of incubation.      COVID-19 (SARS-CoV-2) and Influenza A/B, NAA ([facility] Rapid) [[id number]] Collected: 04/07/22 0900    Order Status: Completed Specimen: Culturette from Nasopharyngeal Updated: 04/07/22 0925     Purpose of COVID testing Diagnostic -PUI     SARS-CoV-2 Specimen Source Nasal Swab     SARS CoV 2 Overall Result Not Detected     Comment: __________________________________________________  -A result of "Detected" indicates POSITIVE for the    presence of SARS CoV-2 RNA  -A result of "Not Detected" indicates NEGATIVE for the    presence of SARS CoV-2 RNA  __________________________________________________________  Test performed using the Roche cobas Liat SARS-CoV-2 assay. This assay is  only for use under the Food and Drug Administration's Emergency Use  Authorization. This is a real-time RT-PCR assay for the qualitative  detection of SARS-CoV-2 RNA. Viral nucleic acids may persist in vivo,  independent of viability. Detection of viral nucleic acid does not imply the  presence of infectious virus, or that virus nucleic acid is the cause of  clinical symptoms. Negative results do not preclude SARS-CoV-2 infection and  should not be used as the sole  basis for diagnosis, treatment or other  patient management decisions. Negative results must be combined with  clinical observations, patient history, and/or epidemiological  information.  Invalid results may be due to inhibiting substances in the specimen and  recollection should occur. Please see Fact Sheets for patients and providers  located:  [email or web address]          Influenza A Not Detected     Influenza B Not Detected     Comment: Test performed using the Roche cobas Liat SARS-CoV-2 & Influenza A/B assay.  This assay is only for use under the Food and Drug Administration's  Emergency Use Authorization. This is a multiplex real-time RT-PCR assay  intended for the simultaneous in vitro qualitative detection and  differentiation of SARS-CoV-2, influenza A, and influenza B virus RNA. Viral  nucleic acids may persist in vivo, independent of viability. Detection of  viral nucleic acid does not imply the presence of infectious virus, or that  virus nucleic acid is the cause of clinical symptoms. Negative results do  not preclude SARS-CoV-2, influenza A, and/or influenza B infection and  should not be used as the sole basis for diagnosis, treatment or other  patient management decisions. Negative results must be combined with  clinical observations, patient history, and/or epidemiological information.  Invalid results may be due to inhibiting substances in the specimen and  recollection should occur. Please see Fact Sheets for patients and providers  located: http://[email or web address]-fact-sheets.         Narrative:      o Collect and clearly label specimen type:  o PREFERRED-Upper respiratory specimen: One Nasal Swab in  Transport Media.  o Hand deliver to laboratory ASAP  Diagnostic -PUI             No data recorded  RADIOLOGY     Radiological Procedure xray personally reviewed and concur with radiologist reports unless stated otherwise.    No orders to display       Signed,  Denny LevyEirianwen Shaniqwa Horsman, PA-C  11:39 AM 04/09/2022     This note was generated by the [facility] system/Dragon speech recognition and may contain inherent errors or omissions not intended by  the user. Grammatical errors, random word insertions, deletions, pronoun errors and incomplete sentences are occasional consequences of this technology due to software limitations. Not all errors are caught or corrected. If there are questions or concerns about the content of this note or information contained within the body of this dictation they should be addressed directly with the author for clarification.

## 2022-04-09 NOTE — Plan of Care (Signed)
Problem: Safety  Goal: Patient will be free from injury during hospitalization  Outcome: Progressing  Flowsheets (Taken 04/09/2022 0219)  Patient will be free from injury during hospitalization:   Assess patient's risk for falls and implement fall prevention plan of care per policy   Provide and maintain safe environment   Ensure appropriate safety devices are available at the bedside   Use appropriate transfer methods   Include patient/ family/ care giver in decisions related to safety   Hourly rounding   Assess for patients risk for elopement and implement Elopement Risk Plan per policy   Provide alternative method of communication if needed (communication boards, writing)     Problem: Compromised Hemodynamic Status  Goal: Vital signs and fluid balance maintained/improved  Outcome: Progressing  Flowsheets (Taken 04/09/2022 0219)  Vital signs and fluid balance are maintained/improved:   Position patient for maximum circulation/cardiac output   Monitor and compare daily weight   Monitor/assess lab values and report abnormal values   Monitor intake and output. Notify LIP if urine output is less than 30 mL/hour.   Monitor/assess vitals and hemodynamic parameters with position changes     Problem: Inadequate Gas Exchange  Goal: Adequate oxygenation and improved ventilation  Outcome: Progressing  Flowsheets (Taken 04/09/2022 0219)  Adequate oxygenation and improved ventilation:   Provide mechanical and oxygen support to facilitate gas exchange   Assess lung sounds   Monitor SpO2 and treat as needed   Monitor and treat ETCO2   Teach/reinforce use of incentive spirometer 10 times per hour while awake, cough and deep breath as needed   Consult/collaborate with Respiratory Therapy   Increase activity as tolerated/progressive mobility   Plan activities to conserve energy: plan rest periods   Position for maximum ventilatory efficiency

## 2022-04-09 NOTE — UM Notes (Signed)
** This review is compiled from documentation provided by the treatment team within the patient's medical record. **     Janace Litten, RN, BSN  Clinical Case Manager - Utilization Review  [facility]  [address]  [place], [place] [address]  UR Main no: [phone]  Fax: [phone]  [email or web address]  System Tax ID [id number]  [facility]            NPI [id number]  [place]      NPI [id number]  [place] Gip        NPI [id number]  [place]         NPI [id number]  [place]     NPI [id number]        Please use fax number to provide Authorization or to request additional information.      CONTINUED STAY REVIEW: 7/23    Patient Name: [name]       DOB: 1955-12-21  MRN: [id number]    PATIENT CLASS:  Patient changed to Inpatient on 04/09/2022    -Continuous telemetry monitoring  -Continued IV steroids, increased frequency for continued coarse wheezing and SOB  -Continued IV abx   -Vital signs     ASSESSMENT/PLAN      [name] is a 66 y.o. female admitted with        Sepsis present on admission likely due to possible CAP versus bronchitis/bronchiolitis due to + rhinovirus and acute COPD exacerbation  Evidenced by lactic acidosis, leukocytosis, suspected infection.  CT chest noting inflammation versus infection LLL.  COVID-19 and influenza A/B negative.  Positive for rhinovirus.  D-dimer negative.  Received IVF resuscitation in ED, completed with resolution of lactic acidosis.  Prelim blood cultures x2 negative. Sputum culture unrevealing. ,Continue IV Rocephin and azithromycin, day #3. Will chest PCT to eval for bacterial component, may be all viral related.Due to continued coarse wheezing will increase frequency of IV solumedrol and duonebs today, and titrate as able.  Continue supportive measures with antitussives, antipyretics.  Continues to oxygenate well on room air.  Continue droplet precautions.  If no significant improvement with medication adjustments  consider pulmonary evaluation in a.m., will need to establish care with pulmonary outpatient for further work-up of suspected underlying undiagnosed COPD with longtime tobacco use.     Stable 4 mm lung nodule on CT imaging  Advise close follow-up for monitoring.     Tobacco abuse  Reports wanting to quit tobacco use, offered nicotine patch and declined at this time.     Hypertension, hyperlipidemia  Continue atorvastatin, losartan, hydrochlorothiazide.     GERD  Continue Pepcid.     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     Sinus tachycardia  Suspected in setting of #1, continue to monitor on telemetry.     Scheduled Meds:  Current Facility-Administered Medications   Medication Dose Route Frequency   . albuterol-ipratropium  3 mL Nebulization Q4H SCH   . atorvastatin  20 mg Oral Daily   . cefTRIAXone  1 g Intravenous Q24H    And   . azithromycin  500 mg Intravenous Q24H   . enoxaparin  40 mg Subcutaneous Daily   . famotidine  20 mg Oral Q12H SCH   . losartan (COZAAR) 100 mg, hydroCHLOROthiazide (HYDRODIURIL) 12.5 mg for HYZAAR   Oral Daily   . methylPREDNISolone  40 mg Intravenous TID   . nicotine  1 patch Transdermal Daily   . vitamin D  1 tablet  Oral Daily     Continuous Infusions:  PRN Meds:.acetaminophen **OR** acetaminophen, dextrose **OR** dextrose **OR** dextrose **OR** glucagon (rDNA), guaiFENesin-codeine, ibuprofen, melatonin, naloxone, ondansetron **OR** ondansetron    Vitals:    04/09/22 1149 04/09/22 1522 04/09/22 1931   BP: 142/85 150/86 (!) 138/92   Pulse: (!) 105 (!) 103 (!) 103   Resp: 20 18 20    Temp: 98.1 F (36.7 C) 97.5 F (36.4 C) 98.1 F (36.7 C)   TempSrc: Oral Oral Oral   SpO2: 96% 96% 97%   Weight:      Height:         Latest Reference Range & Units 04/09/22 05:03   WBC 3.10 - 9.50 x10 3/uL 10.80 (H)   (H): Data is abnormally high

## 2022-04-09 NOTE — Plan of Care (Signed)
Problem: Inadequate Gas Exchange  Goal: Adequate oxygenation and improved ventilation  Outcome: Progressing  Flowsheets (Taken 04/09/2022 1059)  Adequate oxygenation and improved ventilation:   Assess lung sounds   Monitor SpO2 and treat as needed   Position for maximum ventilatory efficiency   Teach/reinforce use of incentive spirometer 10 times per hour while awake, cough and deep breath as needed   Plan activities to conserve energy: plan rest periods   Increase activity as tolerated/progressive mobility  Goal: Patent Airway maintained  Outcome: Progressing  Flowsheets (Taken 04/09/2022 1059)  Patent airway maintained:   Position patient for maximum ventilatory efficiency   Provide adequate fluid intake to liquefy secretions   Suction secretions as needed   Reposition patient every 2 hours and as needed unless able to self-reposition

## 2022-04-10 ENCOUNTER — Ambulatory Visit (INDEPENDENT_AMBULATORY_CARE_PROVIDER_SITE_OTHER): Payer: No Typology Code available for payment source | Admitting: Cardiovascular Disease

## 2022-04-10 LAB — BASIC METABOLIC PANEL
Anion Gap: 11 (ref 5.0–15.0)
BUN: 15 mg/dL (ref 7.0–21.0)
CO2: 25 mEq/L (ref 17–29)
Calcium: 9.8 mg/dL (ref 8.5–10.5)
Chloride: 104 mEq/L (ref 99–111)
Creatinine: 0.8 mg/dL (ref 0.4–1.0)
Glucose: 155 mg/dL — ABNORMAL HIGH (ref 70–100)
Potassium: 4.1 mEq/L (ref 3.5–5.3)
Sodium: 140 mEq/L (ref 135–145)
eGFR: >60 mL/min/{1.73_m2} (ref >=60)

## 2022-04-10 LAB — CBC
Absolute NRBC: 0 10*3/uL (ref 0.00–0.00)
Hematocrit: 38.3 % (ref 34.7–43.7)
Hgb: 12.3 g/dL (ref 11.4–14.8)
MCH: 30 pg (ref 25.1–33.5)
MCHC: 32.1 g/dL (ref 31.5–35.8)
MCV: 93.4 fL (ref 78.0–96.0)
MPV: 11.5 fL (ref 8.9–12.5)
Nucleated RBC: 0 /100 WBC (ref 0.0–0.0)
Platelets: 252 10*3/uL (ref 142–346)
RBC: 4.1 10*6/uL (ref 3.90–5.10)
RDW: 14 % (ref 11–15)
WBC: 11.97 10*3/uL — ABNORMAL HIGH (ref 3.10–9.50)

## 2022-04-10 MED ORDER — METHYLPREDNISOLONE SODIUM SUCC 40 MG IJ SOLR (WRAP)
40.0000 mg | Freq: Two times a day (BID) | INTRAMUSCULAR | Status: DC
Start: 2022-04-10 — End: 2022-04-10

## 2022-04-10 MED ORDER — METHYLPREDNISOLONE SODIUM SUCC 40 MG IJ SOLR (WRAP)
40.0000 mg | Freq: Two times a day (BID) | INTRAMUSCULAR | Status: DC
Start: 2022-04-10 — End: 2022-04-11
  Administered 2022-04-10 – 2022-04-11 (×3): 40 mg via INTRAVENOUS
  Filled 2022-04-10 (×3): qty 1

## 2022-04-10 NOTE — Plan of Care (Signed)
Problem: Inadequate Gas Exchange  Goal: Adequate oxygenation and improved ventilation  Outcome: Progressing  Flowsheets (Taken 04/10/2022 1949)  Adequate oxygenation and improved ventilation:   Monitor SpO2 and treat as needed   Monitor and treat ETCO2   Assess lung sounds   Provide mechanical and oxygen support to facilitate gas exchange   Position for maximum ventilatory efficiency   Plan activities to conserve energy: plan rest periods   Increase activity as tolerated/progressive mobility   Consult/collaborate with Respiratory Therapy   Teach/reinforce use of incentive spirometer 10 times per hour while awake, cough and deep breath as needed     Problem: Inadequate Airway Clearance  Goal: Normal respiratory rate/effort achieved/maintained  Flowsheets (Taken 04/10/2022 1949)  Normal respiratory rate/effort achieved/maintained: Plan activities to conserve energy: plan rest periods

## 2022-04-10 NOTE — Progress Notes (Signed)
[facility]  HOSPITALIST  PROGRESS NOTE      Patient: [name]  Date: 04/10/2022   LOS: 1 Days  Admission Date: 04/07/2022   MRN: [id number]  Attending: Imogene Burn, MD     ASSESSMENT/PLAN     [name] is a 66 y.o. female admitted with      Sepsis present on admission likely due to bronchitis/bronchiolitis due to + rhinovirus and acute COPD exacerbation, CAP ruled out  Evidenced by lactic acidosis, leukocytosis, suspected infection.  CT chest noting inflammation versus infection LLL.  COVID-19 and influenza A/B negative.  Positive for rhinovirus.  D-dimer negative.  Received IVF resuscitation in ED, completed with resolution of lactic acidosis.  Prelim blood cultures x2 negative. Sputum culture unrevealing. S/p IV rocephin x4 days and IV azithro x3 days, PCT negative, will discontinue abx at this time, likely due to viral infection. Due to continued coarse wheezing, increased steroids to TID on 7/24, wheezing mildly improved, start weaning IV steroids on 7/24. Continue IS, acapella, duonebs Q4h, antitussives, antipyretics, encourage out of bed as tolerated. Continues to oxygenate well on room air.  Continue droplet precautions. Very slow improvement, not followed by pulm outpt, consulted Dr. Gaynelle Arabian for further eval and to establish care for needed outpatient follow up/PFT testing.     Stable 4 mm lung nodule on CT imaging  Advise close follow-up for monitoring.    Tobacco abuse  Reports wanting to quit tobacco use, offered nicotine patch and declined at this time.    Hypertension, hyperlipidemia  Continue atorvastatin, losartan, hydrochlorothiazide.    GERD  Continue Pepcid.    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    Sinus tachycardia  Suspected in setting of #1, continue to monitor on telemetry.        Nutrition: Cardiac Diet    Safety Checklist  DVT prophylaxis: Chemical   Foley: Not present   IVs:  Peripheral IV   PT/OT: Not needed   Daily CBC & or Chem ordered: Yes, due to  clinical and lab instability       Patient Lines/Drains/Airways Status       Active PICC Line / CVC Line / PIV Line / Drain / Airway / Intraosseous Line / Epidural Line / ART Line / Line / Wound / Pressure Ulcer / NG/OG Tube       Name Placement date Placement time Site Days    Peripheral IV 04/07/22 Distal;Posterior;Right Forearm 04/07/22  2000  Forearm  less than 1                         Code Status: full code    DISPO: home with family    Family Contact: See chart    Care Plan discussed with nursing, consultants, case manager.       SUBJECTIVE     [name] reports generalized weakness/fatigue.  Did not sleep well last night.  Feels that cough is starting to break up and wheezing slightly improved.  Continues to have some shortness of breath with exertion.  Denies any new fevers or chills.    ROS     Remainder of 10 point ROS as above or otherwise negative    PHYSICAL EXAM     Vitals:    04/10/22 0925   BP: 137/86   Pulse:    Resp:    Temp:    SpO2:        Temperature: Temp  Min: 97.5 F (36.4 C)  Max: 98.6 F (37 C)  Pulse: Pulse  Min: 90  Max: 105  Respiratory: Resp  Min: 16  Max: 21  Non-Invasive BP: BP  Min: 129/77  Max: 150/86  Pulse Oximetry SpO2  Min: 94 %  Max: 97 %    Intake and Output Summary (Last 24 hours) at Date Time  No intake or output data in the 24 hours ending 04/10/22 1000    General: Awake and alert. No acute distress.  Obese.  ENT: Pupils equal and reactive.   Neck: Supple, FROM.  Cardiovascular: RRR. No murmurs.   Pulmonary: On room air.  No respiratory distress.  Wet nonproductive cough.  Diffuse wheezing bilaterally, mild improvement.  Abdomen: Soft, nontender, nondistended. Normoactive BS.   Extremities: No edema. MAEWx4.  Skin: No rashes or lesions noted.   Neuro: No focal neurological deficits. Alert and oriented x3.  Psych: Mood and affect appropriate.     MEDICATIONS     Current Facility-Administered Medications   Medication Dose Route Frequency    albuterol-ipratropium   3 mL Nebulization Q4H SCH    atorvastatin  20 mg Oral Daily    enoxaparin  40 mg Subcutaneous Daily    famotidine  20 mg Oral Q12H SCH    losartan (COZAAR) 100 mg, hydroCHLOROthiazide (HYDRODIURIL) 12.5 mg for HYZAAR   Oral Daily    methylPREDNISolone  40 mg Intravenous BID    nicotine  1 patch Transdermal Daily    vitamin D  1 tablet Oral Daily       LABS     Recent Labs   Lab 04/10/22  0331 04/09/22  0503 04/08/22  0459   WBC 11.97* 10.80* 11.00*   RBC 4.10 4.10 4.00   Hgb 12.3 12.3 12.1   Hematocrit 38.3 38.6 37.6   MCV 93.4 94.1 94.0   Platelets 252 222 221         Recent Labs   Lab 04/10/22  0331 04/09/22  0503 04/08/22  0459 04/07/22  2130 04/07/22  1010   Sodium 140 140 142 140 142   Potassium 4.1 4.3 4.1 3.9 4.2   Chloride 104 108 111 110 107   CO2 25 24 22 20 22    BUN 15.0 13.0 8.0 11.0 9.0   Creatinine 0.8 0.8 0.7 0.8 0.8   Glucose 155* 143* 134* 88 111*   Calcium 9.8 9.7 9.4 9.3 9.9         Recent Labs   Lab 04/07/22  2130 04/07/22  1010   ALT 14 15   AST (SGOT) 16 16   Bilirubin, Total 0.3 0.4   Albumin 3.5 3.9   Alkaline Phosphatase 70 77         Recent Labs   Lab 04/09/22  0503 04/07/22  2326   hs Troponin-I 3.5 14.9*               Microbiology Results (last 15 days)       Procedure Component Value Units Date/Time    CULTURE + Dierdre Forth [[id number]] Collected: 04/08/22 9518    Order Status: Completed Specimen: Sputum, Expectorated Updated: 04/08/22 1656    Narrative:      Culture and Gram Stain, Aerobic, Respiratory was cancelled on 04/08/22 at  16:56 by [id number]; Specimen quality is inadequate for culture  Specimen appears  to be saliva  Please submit another sputum specimen  Induction of sputum may  improve specimen quality  ORDER#: [id number]  ORDERED BY: WELDETSADIK, AB  SOURCE: Sputum, Expectorated respiratory             COLLECTED:  04/08/22 08:22  ANTIBIOTICS AT COLL.:                                RECEIVED :  04/08/22 15:14  Culture and Gram  Stain, Aerobic, Respiratory was cancelled on 04/08/22 at 16:56 by [id number]; Specimen quality is inadequate for  culture  Specimen appears to be saliva  Please submit another sputum specimen  Induction of sputum may improve specimen quality  Stain, Gram (Respiratory)                  FINAL       04/08/22 16:56  04/08/22   Smear contains > or = 10 squamous epithelial cells per low             power field, suggestive of significant oral contamination and             poor quality specimen. Culture not performed.             Please recollect if clinically indicated.  Culture and Gram Stain, Aerobic, RespiratorCANCELLED   04/08/22 16:56            - cancelled on 04/08/22 16:56   by  [id number]   Specimen quality is inadequate for culture  Specimen appears to be saliva   Please submit another sputum specimen  Induction of sputum may improve specimen   quality      COVID-19 (SARS-CoV-2) only (Liat Rapid) asymptomatic admission [[id number]] Collected: 04/08/22 0431    Order Status: Canceled Specimen: Nasopharyngeal     Respiratory Pathogen Panel w/COVID-19, PCR [[id number]]  (Abnormal) Collected: 04/08/22 0431    Order Status: Completed Specimen: Nasopharyngeal Updated: 04/08/22 1610     Adenovirus Not Detected     Coronavirus 229E Not Detected     Coronavirus HKU1 Not Detected     Coronavirus NL63 Not Detected     Coronavirus OC43 Not Detected     SARS CoV-2 RNA Not Detected     Human Metapneumovirus Not Detected     Human Rhinovirus/Enterovirus Detected     Influenza A Not Detected     Influenza B Not Detected     Parainfluenza Virus 1 Not Detected     Parainfluenza Virus 2 Not Detected     Parainfluenza Virus 3 Not Detected     Parainfluenza Virus 4 Not Detected     Respiratory Syncytial Virus Not Detected     Bordetella pertussis Not Detected     Bordetella parapertussis PCR Not Detected     Chlamydophila pneumoniae Not Detected     Mycoplasma pneumoniae Not Detected     Source NP Swab     Test Comment for Respiratory Panel with  COVID See Below     Comment: Testing performed using the [employer] FilmArray  Respiratory Panel (RP 2.1)  This test is for the qualitative detection of  respiratory pathogen nucleic acid. This assay cannot  differentiate between Rhinovirus/Enterovirus. If  necessary for patient care, a positive result for  Rhinovirus/Enterovirus may be followed-up using an  alternate method. This assay should not be used if  B. pertussis infection is specifically suspected as it  is less sensitive than other alternatives. If suspected,  ensure that B. pertussis specific testing is ordered.  Recent administration of a nasal influenza vaccine may  cause false positive results for Influenza A and/or  Influenza B.  Viral and bacterial nucleic acids may persist even  though no viable organism is present. Detection of  nucleic acid does not imply that the corresponding  organisms are infectious or are the causative agents of  clinical symptoms. Positive results of this test do not  rule out coinfection with other organisms. A negative  result does not exclude the possibility of viral or  bacterial infection.  Assay performance characteristics  may vary with circulating strains and this assay may not  be able to distinguish between existing viral strains  and new variants as they emerge. Results of this test  should not be used as the sole basis for diagnosis,  treatment, or other patient management decisions.  This assay is FDA authorized for nasopharyngeal swab  samples.  Performance characteristics for  Bronchoalveolar lavage samples have been determined by  the [facility] laboratory. Other sample types are unacceptable.  Invalid results may be due to inhibiting substances in  the specimen and recollection should occur.          Purpose of COVID testing Diagnostic -PUI    Narrative:      For NP, Call laboratory for VTM NP Swab collection device.  Bronchoalveolar Lavage is submitted in a sterile container.  Diagnostic -PUI    Respiratory  Pathogen Panel w/COVID-19, PCR [[id number]] Collected: 04/08/22 0248    Order Status: Canceled Specimen: Nasopharyngeal     Legionella antigen, urine [[id number]] Collected: 04/08/22 0139    Order Status: Completed Specimen: Urine, Clean Catch Updated: 04/08/22 1335    Narrative:      ORDER#: [id number]                                    ORDERED BY: Pecola Lawless, AB  SOURCE: Urine, Clean Catch                           COLLECTED:  04/08/22 01:39  ANTIBIOTICS AT COLL.:                                RECEIVED :  04/08/22 09:44  Legionella, Rapid Urinary Antigen          FINAL       04/08/22 13:35  04/08/22   Negative for Legionella pneumophila Serogroup 1 Antigen             Limitations of Test:             1. Negative results do not exclude infection with Legionella                pneumophila Serogroup 1.             2. Does not detect other serogroups of L. pneumophila                or other Legionella species.             Test Reference Range: Negative      Culture Blood Aerobic and Anaerobic [[id number]] Collected: 04/07/22 2326    Order Status: Completed Specimen: Blood, Venipuncture Updated: 04/10/22 5188    Narrative:      The order will result in two separate 8-24ml bottles  Please do NOT order repeat blood cultures if one has been  drawn  within the last 48 hours  UNLESS concerned for  endocarditis  AVOID BLOOD CULTURE DRAWS FROM CENTRAL LINE IF POSSIBLE  Indications:->Sepsis  Indications:->Pneumonia  ORDER#: [id number]                                    ORDERED BY: WELDETSADIK, AB  SOURCE: Blood, Venipuncture                          COLLECTED:  04/07/22 23:26  ANTIBIOTICS AT COLL.:                                RECEIVED :  04/08/22 05:51  Culture Blood Aerobic and Anaerobic        PRELIM      04/10/22 06:21  04/09/22   No Growth after 1 day/s of incubation.  04/10/22   No Growth after 2 day/s of incubation.      Culture Blood Aerobic and Anaerobic [[id number]] Collected: 04/07/22 2130    Order Status: Completed  Specimen: Blood, Venipuncture Updated: 04/10/22 0021    Narrative:      The order will result in two separate 8-66ml bottles  Please do NOT order repeat blood cultures if one has been  drawn within the last 48 hours  UNLESS concerned for  endocarditis  AVOID BLOOD CULTURE DRAWS FROM CENTRAL LINE IF POSSIBLE  Indications:->Sepsis  Indications:->Pneumonia  ORDER#: [id number]                                    ORDERED BY: WELDETSADIK, AB  SOURCE: Blood, Venipuncture                          COLLECTED:  04/07/22 21:30  ANTIBIOTICS AT COLL.:                                RECEIVED :  04/07/22 23:57  Culture Blood Aerobic and Anaerobic        PRELIM      04/10/22 00:21  04/09/22   No Growth after 1 day/s of incubation.  04/10/22   No Growth after 2 day/s of incubation.      COVID-19 (SARS-CoV-2) and Influenza A/B, NAA (Liat Rapid) [[id number]] Collected: 04/07/22 0900    Order Status: Completed Specimen: Culturette from Nasopharyngeal Updated: 04/07/22 0925     Purpose of COVID testing Diagnostic -PUI     SARS-CoV-2 Specimen Source Nasal Swab     SARS CoV 2 Overall Result Not Detected     Comment: __________________________________________________  -A result of "Detected" indicates POSITIVE for the    presence of SARS CoV-2 RNA  -A result of "Not Detected" indicates NEGATIVE for the    presence of SARS CoV-2 RNA  __________________________________________________________  Test performed using the Roche cobas Liat SARS-CoV-2 assay. This assay is  only for use under the Food and Drug Administration's Emergency Use  Authorization. This is a real-time RT-PCR assay for the qualitative  detection of SARS-CoV-2 RNA. Viral nucleic acids may persist in vivo,  independent of viability. Detection of viral nucleic acid does not imply the  presence of infectious virus, or that virus nucleic acid is  the cause of  clinical symptoms. Negative results do not preclude SARS-CoV-2 infection and  should not be used as the sole basis for  diagnosis, treatment or other  patient management decisions. Negative results must be combined with  clinical observations, patient history, and/or epidemiological information.  Invalid results may be due to inhibiting substances in the specimen and  recollection should occur. Please see Fact Sheets for patients and providers  located:  WirelessDSLBlog.no          Influenza A Not Detected     Influenza B Not Detected     Comment: Test performed using the Roche cobas Liat SARS-CoV-2 & Influenza A/B assay.  This assay is only for use under the Food and Drug Administration's  Emergency Use Authorization. This is a multiplex real-time RT-PCR assay  intended for the simultaneous in vitro qualitative detection and  differentiation of SARS-CoV-2, influenza A, and influenza B virus RNA. Viral  nucleic acids may persist in vivo, independent of viability. Detection of  viral nucleic acid does not imply the presence of infectious virus, or that  virus nucleic acid is the cause of clinical symptoms. Negative results do  not preclude SARS-CoV-2, influenza A, and/or influenza B infection and  should not be used as the sole basis for diagnosis, treatment or other  patient management decisions. Negative results must be combined with  clinical observations, patient history, and/or epidemiological information.  Invalid results may be due to inhibiting substances in the specimen and  recollection should occur. Please see Fact Sheets for patients and providers  located: http://www.rice.biz/.         Narrative:      o Collect and clearly label specimen type:  o PREFERRED-Upper respiratory specimen: One Nasal Swab in  Transport Media.  o Hand deliver to laboratory ASAP  Diagnostic -PUI             No data recorded  RADIOLOGY     Radiological Procedure xray personally reviewed and concur with radiologist reports unless stated otherwise.    No orders to display       Signed,  Denny Levy,  PA-C  10:00 AM 04/10/2022     This note was generated by the [facility] ( [facility]) EMR system/Dragon speech recognition and may contain inherent errors or omissions not intended by the user. Grammatical errors, random word insertions, deletions, pronoun errors and incomplete sentences are occasional consequences of this technology due to software limitations. Not all errors are caught or corrected. If there are questions or concerns about the content of this note or information contained within the body of this dictation they should be addressed directly with the author for clarification.

## 2022-04-10 NOTE — Plan of Care (Signed)
Problem: Safety  Goal: Patient will be free from injury during hospitalization  Outcome: Progressing  Goal: Patient will be free from infection during hospitalization  Outcome: Progressing     Problem: Pain  Goal: Pain at adequate level as identified by patient  Outcome: Progressing     Problem: Compromised Hemodynamic Status  Goal: Vital signs and fluid balance maintained/improved  Outcome: Progressing  Flowsheets (Taken 04/10/2022 1104)  Vital signs and fluid balance are maintained/improved:   Position patient for maximum circulation/cardiac output   Monitor and compare daily weight   Monitor/assess lab values and report abnormal values   Monitor intake and output. Notify LIP if urine output is less than 30 mL/hour.   Monitor/assess vitals and hemodynamic parameters with position changes     Problem: Inadequate Gas Exchange  Goal: Adequate oxygenation and improved ventilation  Outcome: Progressing  Flowsheets (Taken 04/10/2022 1104)  Adequate oxygenation and improved ventilation:   Assess lung sounds   Provide mechanical and oxygen support to facilitate gas exchange   Teach/reinforce use of incentive spirometer 10 times per hour while awake, cough and deep breath as needed   Plan activities to conserve energy: plan rest periods   Consult/collaborate with Respiratory Therapy   Position for maximum ventilatory efficiency   Increase activity as tolerated/progressive mobility   Monitor SpO2 and treat as needed     Problem: Inadequate Airway Clearance  Goal: Normal respiratory rate/effort achieved/maintained  Outcome: Progressing  Flowsheets (Taken 04/10/2022 1104)  Normal respiratory rate/effort achieved/maintained: Plan activities to conserve energy: plan rest periods

## 2022-04-10 NOTE — UM Notes (Incomplete)
PATIENT:  [name]  DOB:          12-Feb-1956    CONTINUED STAY REVIEW:       --------04/10/22----------   Vitals:    04/10/22 0357 04/10/22 0459 04/10/22 0754 04/10/22 0925   BP: 143/84  147/82 137/86   Pulse: 93 90 90    Resp: 20 16 21     Temp: 97.9 F (36.6 C)  97.9 F (36.6 C)    TempSrc: Oral  Oral    SpO2: 95%  97%    Weight:       Height:             RECENT LABS:  Results       Procedure Component Value Units Date/Time    Culture Blood Aerobic and Anaerobic [[id number]] Collected: 04/07/22 2326    Specimen: Blood, Venipuncture Updated: 04/10/22 5956    Narrative:      The order will result in two separate 8-42ml bottles  Please do NOT order repeat blood cultures if one has been  drawn within the last 48 hours  UNLESS concerned for  endocarditis  AVOID BLOOD CULTURE DRAWS FROM CENTRAL LINE IF POSSIBLE  Indications:->Sepsis  Indications:->Pneumonia  ORDER#: [id number]                                    ORDERED BY: WELDETSADIK, AB  SOURCE: Blood, Venipuncture                          COLLECTED:  04/07/22 23:26  ANTIBIOTICS AT COLL.:                                RECEIVED :  04/08/22 05:51  Culture Blood Aerobic and Anaerobic        PRELIM      04/10/22 06:21  04/09/22   No Growth after 1 day/s of incubation.  04/10/22   No Growth after 2 day/s of incubation.      Basic Metabolic Panel [[id number]]  (Abnormal) Collected: 04/10/22 0331    Specimen: Blood Updated: 04/10/22 0441     Glucose 155 mg/dL      BUN 06.3 mg/dL      Creatinine 0.8 mg/dL      Calcium 9.8 mg/dL      Sodium 016 mEq/L      Potassium 4.1 mEq/L      Chloride 104 mEq/L      CO2 25 mEq/L      Anion Gap 11.0     eGFR >60.0 mL/min/1.73 m2     CBC without differential [[id number]]  (Abnormal) Collected: 04/10/22 0331    Specimen: Blood Updated: 04/10/22 0424     WBC 11.97 x10 3/uL      Hgb 12.3 g/dL      Hematocrit 73.2 %      Platelets 252 x10 3/uL      RBC 4.10 x10 6/uL      MCV 93.4 fL      MCH 30.0 pg      MCHC 32.1 g/dL      RDW 14 %       MPV 11.5 fL      Nucleated RBC 0.0 /100 WBC      Absolute NRBC 0.00 x10 3/uL     Culture Blood Aerobic and Anaerobic [[id number]]  Collected: 04/07/22 2130    Specimen: Blood, Venipuncture Updated: 04/10/22 0021    Narrative:      The order will result in two separate 8-45ml bottles  Please do NOT order repeat blood cultures if one has been  drawn within the last 48 hours  UNLESS concerned for  endocarditis  AVOID BLOOD CULTURE DRAWS FROM CENTRAL LINE IF POSSIBLE  Indications:->Sepsis  Indications:->Pneumonia  ORDER#: [id number]                                    ORDERED BY: WELDETSADIK, AB  SOURCE: Blood, Venipuncture                          COLLECTED:  04/07/22 21:30  ANTIBIOTICS AT COLL.:                                RECEIVED :  04/07/22 23:57  Culture Blood Aerobic and Anaerobic        PRELIM      04/10/22 00:21  04/09/22   No Growth after 1 day/s of incubation.  04/10/22   No Growth after 2 day/s of incubation.      Basic Metabolic Panel [[id number]]  (Abnormal) Collected: 04/09/22 0503    Specimen: Blood Updated: 04/09/22 1832     Glucose 143 mg/dL      BUN 86.5 mg/dL      Creatinine 0.8 mg/dL      Calcium 9.7 mg/dL      Sodium 784 mEq/L      Potassium 4.3 mEq/L      Chloride 108 mEq/L      CO2 24 mEq/L      Anion Gap 8.0     eGFR >60.0 mL/min/1.73 m2     Procalcitonin [[id number]] Collected: 04/09/22 0503     Updated: 04/09/22 1832     Procalcitonin <0.02 ng/ml                 MEDICATIONS:  Current Facility-Administered Medications   Medication Dose Route Frequency    albuterol-ipratropium  3 mL Nebulization Q4H SCH    atorvastatin  20 mg Oral Daily    enoxaparin  40 mg Subcutaneous Daily    famotidine  20 mg Oral Q12H SCH    losartan (COZAAR) 100 mg, hydroCHLOROthiazide (HYDRODIURIL) 12.5 mg for HYZAAR   Oral Daily    methylPREDNISolone  40 mg Intravenous BID    nicotine  1 patch Transdermal Daily    vitamin D  1 tablet Oral Daily                 _____________________________________________    ** This  clinical review is compiled from documentation provided by the treatment team in the medical record. **  _____________________________________________    Lodema Pilot, RN, BSN, ACM-RN  Utilization Review Nurse II  [facility]  [address]  [address]  [place], [place] [address]  Phone: [phone]  Main Office Phone: [phone]  Fax: [phone]  Tax ID:  [id number]  NPI:  [id number]    ------------------------------------------------------------------------

## 2022-04-10 NOTE — Consults (Signed)
[facility] Consult Note      Date Time: 04/10/22 4:53 PM    Consultation requested by patient for evaluation of COPD and pulmonary nodules.    Patient is admitted with acute bronchospasm and chest discomfort and subsequently identified as having human rhinovirus infection.  She is treated with methylprednisolone and DuoNeb as well as Pepcid for GI prophylaxis.  Her outpatient losartan, vitamin D and atorvastatin were also ordered.  She has not been hypoxic at hospital.    A pulmonary nodule was identified serendipitously on evaluation of flank and chest discomfort March 07, 2022.  Additionally there was enlargement of the main pulmonary artery and atherosclerotic calcifications of the aorta and coronary arteries were seen.  The patient is known to have smoked less than half pack of cigarettes per day for the past 40 years and has reduced to 3 to 4 cigarettes/day in the last year.    Admission CT scan was notable for small airways thickening consistent with bronchiolitis.  The 4 mm nodule in right middle lobe was unchanged in the past 4 weeks.    Currently the patient does not wish to use nicotine patch to further her nicotine dependency.  She is aware of the need to have behavioral withdraw from cigarettes.    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  Presumably she acquired the rhinovirus at work as fortunately there are no sick contacts at home.  She is limited in her tobacco use at work and motivated to have smoking cessation.      Assessment:   Acute bronchiolitis secondary to rhinovirus infection  Tobacco exposure -will need assessment of pulmonary function 6 to 8 weeks after cessation and resolution of viral illness  4 mm nodule statistically benign, but will be reassessed in 12 months with follow-up CT scan    Plan:   Consider empiric triple inhaler therapy on discharge (Breztri 2 puffs twice daily or Trelegy 1 puff daily  depending upon insurance coverage)  Prednisone taper as outpatient with 5 mg tablets 6 tablets daily with food and decrease by 1 each day  Continue famotidine 20 mg in the evening  Follow-up in my office    Events/Subjective:   Sitting in chair, no oxygen supplementation    Medications:     Current Facility-Administered Medications   Medication Dose Route Frequency    albuterol-ipratropium  3 mL Nebulization Q4H SCH    atorvastatin  20 mg Oral Daily    enoxaparin  40 mg Subcutaneous Daily    famotidine  20 mg Oral Q12H SCH    losartan (COZAAR) 100 mg, hydroCHLOROthiazide (HYDRODIURIL) 12.5 mg for HYZAAR   Oral Daily    methylPREDNISolone  40 mg Intravenous Q12H    nicotine  1 patch Transdermal Daily    vitamin D  1 tablet Oral Daily           Physical Exam:     Vitals:    04/10/22 0754 04/10/22 0925 04/10/22 1124 04/10/22 1517   BP: 147/82 137/86 135/84 143/84   Pulse: 90  (!) 102 (!) 111   Resp: 21  16 21    Temp: 97.9 F (36.6 C)  97.7 F (36.5 C) 98.4 F (36.9 C)   TempSrc: Oral  Oral Oral   SpO2: 97%  95% 96%   Weight:       Height:         Temp (24hrs), Avg:98 F (36.7  C), Min:97.7 F (36.5 C), Max:98.4 F (36.9 C)        Vent Settings:       General Appearance: No acute distress  Lungs: Diffuse bilateral wheezes or rhonchi  Heart: 2/6 murmur at LS B  Abdomen: Soft, possible midepigastric discomfort  Extremities: No peripheral edema, no calf tenderness  Neurologic: Intact  No intake or output data in the 24 hours ending 04/10/22 1653    Patient Lines/Drains/Airways Status       Active PICC Line / CVC Line / PIV Line / Drain / Airway / Intraosseous Line / Epidural Line / ART Line / Line / Wound / Pressure Ulcer / NG/OG Tube       Name Placement date Placement time Site Days    Peripheral IV 04/07/22 Distal;Posterior;Right Forearm 04/07/22  2000  Forearm  2                    Labs:     Recent Labs   Lab 04/10/22  0331 04/09/22  0503 04/08/22  0459 04/07/22  2130 04/07/22  1010   Glucose 155* 143* 134* 88  111*      Recent Labs   Lab 04/10/22  0331 04/09/22  0503 04/08/22  0459   WBC 11.97* 10.80* 11.00*   RBC 4.10 4.10 4.00   Hgb 12.3 12.3 12.1   Hematocrit 38.3 38.6 37.6   MCV 93.4 94.1 94.0   MCHC 32.1 31.9 32.2   RDW 14 14 14    MPV 11.5 11.6 11.9   Platelets 252 222 221     Recent Labs   Lab 04/07/22  2130 04/07/22  1010   ALT 14 15   AST (SGOT) 16 16   Globulin 3.2 3.4   Albumin 3.5 3.9     Recent Labs   Lab 04/09/22  0503 04/07/22  2326   hs Troponin-I 3.5 14.9*             Recent Labs   Lab 04/07/22  2130   Bilirubin, Total 0.3   Protein, Total 6.7   Albumin 3.5   ALT 14   AST (SGOT) 16             Recent Labs   Lab 04/10/22  0331 04/09/22  0503 04/08/22  0459 04/07/22  2130 04/07/22  1010   Sodium 140 140 142 140 142   Potassium 4.1 4.3 4.1 3.9 4.2   Chloride 104 108 111 110 107   CO2 25 24 22 20 22    BUN 15.0 13.0 8.0 11.0 9.0   Creatinine 0.8 0.8 0.7 0.8 0.8   Calcium 9.8 9.7 9.4 9.3 9.9   Albumin  --   --   --  3.5 3.9   Protein, Total  --   --   --  6.7 7.3   Bilirubin, Total  --   --   --  0.3 0.4   Alkaline Phosphatase  --   --   --  70 77   ALT  --   --   --  14 15   AST (SGOT)  --   --   --  16 16   Glucose 155* 143* 134* 88 111*         Microbiology:     Urine  Blood  Sputum    Rads:   Radiological Procedure reviewed.      Radiology Results (24 Hour)       ** No results  found for the last 24 hours. **            Hilma Favors, MD [facility]  [facility] [phone]

## 2022-04-11 LAB — CBC
Absolute NRBC: 0 10*3/uL (ref 0.00–0.00)
Hematocrit: 40.6 % (ref 34.7–43.7)
Hgb: 13.1 g/dL (ref 11.4–14.8)
MCH: 29.4 pg (ref 25.1–33.5)
MCHC: 32.3 g/dL (ref 31.5–35.8)
MCV: 91.2 fL (ref 78.0–96.0)
MPV: 11.2 fL (ref 8.9–12.5)
Nucleated RBC: 0 /100 WBC (ref 0.0–0.0)
Platelets: 262 10*3/uL (ref 142–346)
RBC: 4.45 10*6/uL (ref 3.90–5.10)
RDW: 13 % (ref 11–15)
WBC: 11.49 10*3/uL — ABNORMAL HIGH (ref 3.10–9.50)

## 2022-04-11 LAB — BASIC METABOLIC PANEL
Anion Gap: 11 (ref 5.0–15.0)
BUN: 14 mg/dL (ref 7.0–21.0)
CO2: 25 mEq/L (ref 17–29)
Calcium: 9.6 mg/dL (ref 8.5–10.5)
Chloride: 103 mEq/L (ref 99–111)
Creatinine: 0.8 mg/dL (ref 0.4–1.0)
Glucose: 142 mg/dL — ABNORMAL HIGH (ref 70–100)
Potassium: 4.1 mEq/L (ref 3.5–5.3)
Sodium: 139 mEq/L (ref 135–145)
eGFR: >60 mL/min/{1.73_m2} (ref >=60)

## 2022-04-11 MED ORDER — TRELEGY ELLIPTA 100-62.5-25 MCG/ACT IN AEPB
1.0000 | INHALATION_SPRAY | Freq: Every day | RESPIRATORY_TRACT | 0 refills | Status: DC
Start: 2022-04-11 — End: 2022-04-11

## 2022-04-11 MED ORDER — PREDNISONE 10 MG PO TABS
ORAL_TABLET | ORAL | 0 refills | Status: AC
Start: 2022-04-12 — End: 2022-04-20

## 2022-04-11 MED ORDER — ALBUTEROL SULFATE HFA 108 (90 BASE) MCG/ACT IN AERS
2.0000 | INHALATION_SPRAY | Freq: Four times a day (QID) | RESPIRATORY_TRACT | 0 refills | Status: DC | PRN
Start: 2022-04-11 — End: 2023-07-26

## 2022-04-11 MED ORDER — TRELEGY ELLIPTA 100-62.5-25 MCG/ACT IN AEPB
1.0000 | INHALATION_SPRAY | Freq: Every day | RESPIRATORY_TRACT | 0 refills | Status: AC
Start: 2022-04-11 — End: ?

## 2022-04-11 MED ORDER — BUDESON-GLYCOPYRROL-FORMOTEROL 160-9-4.8 MCG/ACT IN AERO
2.0000 | INHALATION_SPRAY | Freq: Two times a day (BID) | RESPIRATORY_TRACT | 0 refills | Status: DC
Start: 2022-04-11 — End: 2022-04-11

## 2022-04-11 MED ORDER — GUAIFENESIN-CODEINE 100-10 MG/5ML PO SYRP
5.0000 mL | ORAL_SOLUTION | Freq: Four times a day (QID) | ORAL | 0 refills | Status: DC | PRN
Start: 2022-04-11 — End: 2022-04-28

## 2022-04-11 MED ORDER — BENZONATATE 100 MG PO CAPS
100.0000 mg | ORAL_CAPSULE | Freq: Three times a day (TID) | ORAL | 0 refills | Status: DC | PRN
Start: 2022-04-11 — End: 2022-04-28

## 2022-04-11 MED ORDER — FAMOTIDINE 20 MG PO TABS
20.0000 mg | ORAL_TABLET | Freq: Every day | ORAL | 0 refills | Status: AC
Start: 2022-04-11 — End: 2022-05-11

## 2022-04-11 NOTE — Progress Notes (Signed)
Discharge instructions reviewed with patient. Pt verbalized understanding of appropriate follow up care and medication administration. Meds to be filled by outpatient pharmacy. IV removed. BP 131/87   Pulse 93   Temp 98.4 F (36.9 C) (Oral)   Resp 18   Ht 1.524 m (5')   Wt 91.3 kg (201 lb 4.5 oz)   SpO2 99%   BMI 39.31 kg/m

## 2022-04-11 NOTE — Discharge Summary (Signed)
[facility]      Patient: [name]  Admission Date: 04/07/2022   DOB: 1956-05-24  Discharge Date: 04/11/2022    MRN: [id number]  Discharge Attending: Imogene Burn, MD   Referring Physician: Roselie Skinner, MD  PCP: Roselie Skinner, MD       DISCHARGE SUMMARY     Discharge Information   Admission Diagnosis:   Sepsis present on admission likely due to possible CAP, dyspnea on exertion likely due to bronchitis/bronchiolitis and possible acute on chronic COPD exacerbation with possible CAP versus underlying viral infection, leukocytosis, lactic acidosis, stable 4 mm fissural based nodule in left lower lobe, tobacco use disorder, hypertension, hyperlipidemia, GERD    Discharge Diagnosis:   Sepsis present on admission likely due to bronchitis/bronchiolitis due to rhinovirus, and suspected acute COPD exacerbation, CAP ruled out, stable 4 mm lung nodule on CT imaging, tobacco abuse, hypertension, hyperlipidemia, GERD, 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, sinus tachycardia in setting of viral illness    Admission Condition: poor  Discharge Condition: stable  Discharge Disposition: Home with family    Discharge Medications:     Medication List        START taking these medications      albuterol sulfate HFA 108 (90 Base) MCG/ACT inhaler  Commonly known as: PROVENTIL  Inhale 2 puffs into the lungs every 6 (six) hours as needed for Wheezing or Shortness of Breath     benzonatate 100 MG capsule  Commonly known as: TESSALON  Take 1 capsule (100 mg) by mouth 3 (three) times daily as needed for Cough     famotidine 20 MG tablet  Commonly known as: PEPCID  Take 1 tablet (20 mg) by mouth daily     guaiFENesin-codeine 100-10 MG/5ML syrup  Commonly known as: ROBITUSSIN AC  Take 5 mLs by mouth every 6 (six) hours as needed for Cough or Congestion     predniSONE 10 MG tablet  Commonly known as: DELTASONE  Take 5 tablets (50 mg) by mouth every morning with breakfast for 1 day, THEN 4 tablets (40 mg) every morning with  breakfast for 2 days, THEN 3 tablets (30 mg) every morning with breakfast for 2 days, THEN 2 tablets (20 mg) every morning with breakfast for 2 days, THEN 1 tablet (10 mg) every morning with breakfast for 1 day.  Start taking on: April 12, 2022     Trelegy Ellipta 100-62.5-25 MCG/ACT Aepb  Generic drug: fluticasone-umeclidinium-vilanterol  Inhale 1 puff into the lungs daily            CONTINUE taking these medications      atorvastatin 20 MG tablet  Commonly known as: LIPITOR  Take 1 tablet (20 mg) by mouth daily     ELDERBERRY PO     losartan-hydrochlorothiazide 100-12.5 MG per tablet  Commonly known as: HYZAAR  TAKE 1 TABLET BY MOUTH EVERY DAY     Magnesium 250 MG Tabs     vitamin D 25 MCG (1000 UT) tablet  Commonly known as: cholecalciferol     Vitamin E 670 MG (1000 UT) Caps               Where to Get Your Medications        These medications [facility] sent to [facility] [address] [place] - [address]  [address] [address] [place] [address]      Phone: [phone]   Trelegy Ellipta 100-62.5-25 MCG/ACT Aepb       These medications  were sent to [facility]  [address] [place] [address]      Hours: Monday - Friday 9AM to 6PM, Saturday 9AM to 3PM, closed Sunday Phone: [phone]   albuterol sulfate HFA 108 (90 Base) MCG/ACT inhaler  benzonatate 100 MG capsule  famotidine 20 MG tablet  guaiFENesin-codeine 100-10 MG/5ML syrup  predniSONE 10 MG tablet          Patient Lines/Drains/Airways Status       Active PICC Line / CVC Line / PIV Line / Drain / Airway / Intraosseous Line / Epidural Line / ART Line / Line / Wound / Pressure Ulcer / NG/OG Tube       Name Placement date Placement time Site Days    Peripheral IV 04/07/22 Distal;Posterior;Right Forearm 04/07/22  2000  Forearm  3                       Hospital Course   Presentation History   [name] is a 66 y.o. female with a PMHx tobacco use disorder, COPD, hypertension, hyperlipidemia, arthritis, and GERD who presented with  sudden new onset nonproductive cough and shortness of breath that started yesterday evening after she got back from work.  She admits to smoking 2 to 3 cigarettes a day since she was 66 years old. She reports that she went to urgent care this morning where she was advised to go to a hospital.  She reports that her doctor recently diagnosed her with COPD but never has had exacerbation nor taking any medication for COPD at home. She denies having similar symptoms nor getting admitted for COPD in the past. Denies any chest pain or palpitation. During my exam, She was tachypneic with intermittent wet but nonproductive cough.     Patient transferred from [facility] emergency where she was initially evaluated. In the ER, she was Found to have elevated WBC 13.44, neutrophils absolute 9.99, and lactic acidosis 3.3.  Mild tachycardia; CT chest without contrast noted mild small airways wall thickening in the left lower lobe, infectious or inflammatory bronchitis/bronchiolitis; No consolidation; and Stable 4 mm nodule compared to June exam.    See HPI for details.    Hospital Course (2 Days)   Sepsis present on admission likely due to bronchitis/bronchiolitis due to + rhinovirus and acute COPD exacerbation, CAP ruled out  Evidenced by lactic acidosis, leukocytosis, suspected infection.  CT chest noting inflammation versus infection LLL.  COVID-19 and influenza A/B negative.  Positive for rhinovirus.  D-dimer negative.  Received IVF resuscitation in ED, completed with resolution of lactic acidosis.  Prelim blood cultures x2 negative. Sputum culture unrevealing. S/p IV rocephin x4 days and IV azithro x3 days, PCT negative, antibiotics discontinued, presentation/symptoms likely due to viral infection. Due to continued coarse wheezing, increased steroids to TID on 7/23, wheezing continuing to improve as of 7/24. Continue IS, acapella, duonebs Q4h, antitussives, antipyretics, encourage out of bed as tolerated. Continues to  oxygenate well on room air.  Continue droplet precautions.  Pulmonary consulted, Dr. Gaynelle Arabian, recommended follow-up outpatient in 6 to 8 weeks once recovered from viral illness for PFT advised smoking cessation.  Also recommended starting Trelegy inhaler daily along with as needed albuterol for symptom control.  Patient wheezing nearly resolved on 7/25 and overall improved continues to oxygenate well on room air, stable at this time for discharge with plans for slow steroid taper, daily inhaler, as needed albuterol, antitussives, and supportive care at home.  Advise follow-up with PCP this week for reevaluation.     Stable 4 mm lung nodule on CT imaging  Advise close follow-up for monitoring.     Tobacco abuse  Reports wanting to quit tobacco use, offered nicotine patch and declined at this time.     Hypertension, hyperlipidemia  Continue atorvastatin, losartan, hydrochlorothiazide.     GERD  Continue Pepcid.     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     Sinus tachycardia  Suspected in setting of #1.      Patient has BMI=Body mass index is 39.31 kg/m.  Diagnosis: Obesity based on BMI criteria         Subjective:   Reports starting to expectorate sputum and wheezing slowly improving.  Shortness of breath improved.  Still has some pleuritic pain due to severe coughing.  Agreeable for discharge home today.  All questions answered.    Procedures/Imaging:   No orders to display       Treatment Team:   Attending Provider: Imogene Burn, MD  Consulting Physician: Hilma Favors, MD       Physical Exam at Discharge     Vitals:    04/11/22 0025 04/11/22 0435 04/11/22 0930 04/11/22 0932   BP: 133/80 137/88 131/87 131/87   Pulse: (!) 111 (!) 112  93   Resp: 17 18     Temp: 98.2 F (36.8 C) 98.4 F (36.9 C)     TempSrc: Oral Oral     SpO2: 96% 98%     Weight:       Height:           General: Obese.  Awake and alert. No acute distress.  HEENT: Pupils equal and reactive bilaterally. No scleral icterus.   Neck:  Supple. FROM.   Cardiovascular: Regular rate and rhythm. No murmurs, rubs, gallops.  Lungs: On room air.  Coarse cough.  No respiratory distress.  Minimal wheezing bilateral lungs, significantly improved.    Abdomen: Soft, nontender, nondistended. Normoactive bowel sounds.  Extremities: No edema. MAEW x4.  Skin: No rashes or lesions noted.  Neuro: Alert and oriented x3. No Focal neurological deficits.  Psych: Mood and affect appropriate.       Diagnostics     Labs/Studies Pending at Discharge: Yes    Last Labs   Recent Labs   Lab 04/11/22  0607 04/10/22  0331 04/09/22  0503   WBC 11.49* 11.97* 10.80*   RBC 4.45 4.10 4.10   Hgb 13.1 12.3 12.3   Hematocrit 40.6 38.3 38.6   MCV 91.2 93.4 94.1   Platelets 262 252 222       Recent Labs   Lab 04/11/22  0607 04/10/22  0331 04/09/22  0503 04/08/22  0459 04/07/22  2130   Sodium 139 140 140 142 140   Potassium 4.1 4.1 4.3 4.1 3.9   Chloride 103 104 108 111 110   CO2 25 25 24 22 20    BUN 14.0 15.0 13.0 8.0 11.0   Creatinine 0.8 0.8 0.8 0.7 0.8   Glucose 142* 155* 143* 134* 88   Calcium 9.6 9.8 9.7 9.4 9.3       Microbiology Results (last 15 days)       Procedure Component Value Units Date/Time    CULTURE + Dierdre Forth [[id number]] Collected: 04/08/22 5784    Order Status: Completed Specimen: Sputum, Expectorated Updated: 04/08/22 1656    Narrative:      Culture and Gram Stain,  Aerobic, Respiratory was cancelled on 04/08/22 at  16:56 by [id number]; Specimen quality is inadequate for culture  Specimen appears  to be saliva  Please submit another sputum specimen  Induction of sputum may  improve specimen quality  ORDER#: [id number]                                    ORDERED BY: WELDETSADIK, AB  SOURCE: Sputum, Expectorated respiratory             COLLECTED:  04/08/22 08:22  ANTIBIOTICS AT COLL.:                                RECEIVED :  04/08/22 15:14  Culture and Gram Stain, Aerobic, Respiratory was cancelled on 04/08/22 at 16:56 by [id number]; Specimen quality is  inadequate for  culture  Specimen appears to be saliva  Please submit another sputum specimen  Induction of sputum may improve specimen quality  Stain, Gram (Respiratory)                  FINAL       04/08/22 16:56  04/08/22   Smear contains > or = 10 squamous epithelial cells per low             power field, suggestive of significant oral contamination and             poor quality specimen. Culture not performed.             Please recollect if clinically indicated.  Culture and Gram Stain, Aerobic, RespiratorCANCELLED   04/08/22 16:56            - cancelled on 04/08/22 16:56   by  [id number]   Specimen quality is inadequate for culture  Specimen appears to be saliva   Please submit another sputum specimen  Induction of sputum may improve specimen   quality      COVID-19 (SARS-CoV-2) only (Liat Rapid) asymptomatic admission [[id number]] Collected: 04/08/22 0431    Order Status: Canceled Specimen: Nasopharyngeal     Respiratory Pathogen Panel w/COVID-19, PCR [[id number]]  (Abnormal) Collected: 04/08/22 0431    Order Status: Completed Specimen: Nasopharyngeal Updated: 04/08/22 1610     Adenovirus Not Detected     Coronavirus 229E Not Detected     Coronavirus HKU1 Not Detected     Coronavirus NL63 Not Detected     Coronavirus OC43 Not Detected     SARS CoV-2 RNA Not Detected     Human Metapneumovirus Not Detected     Human Rhinovirus/Enterovirus Detected     Influenza A Not Detected     Influenza B Not Detected     Parainfluenza Virus 1 Not Detected     Parainfluenza Virus 2 Not Detected     Parainfluenza Virus 3 Not Detected     Parainfluenza Virus 4 Not Detected     Respiratory Syncytial Virus Not Detected     Bordetella pertussis Not Detected     Bordetella parapertussis PCR Not Detected     Chlamydophila pneumoniae Not Detected     Mycoplasma pneumoniae Not Detected     Source NP Swab     Test Comment for Respiratory Panel with COVID See Below     Comment: Testing performed using the [employer] FilmArray  Respiratory Panel  (RP 2.1)  This test is for the qualitative detection  of  respiratory pathogen nucleic acid. This assay cannot  differentiate between Rhinovirus/Enterovirus. If  necessary for patient care, a positive result for  Rhinovirus/Enterovirus may be followed-up using an  alternate method. This assay should not be used if  B. pertussis infection is specifically suspected as it  is less sensitive than other alternatives. If suspected,  ensure that B. pertussis specific testing is ordered.  Recent administration of a nasal influenza vaccine may  cause false positive results for Influenza A and/or  Influenza B.  Viral and bacterial nucleic acids may persist even  though no viable organism is present. Detection of  nucleic acid does not imply that the corresponding  organisms are infectious or are the causative agents of  clinical symptoms. Positive results of this test do not  rule out coinfection with other organisms. A negative  result does not exclude the possibility of viral or  bacterial infection.  Assay performance characteristics  may vary with circulating strains and this assay may not  be able to distinguish between existing viral strains  and new variants as they emerge. Results of this test  should not be used as the sole basis for diagnosis,  treatment, or other patient management decisions.  This assay is FDA authorized for nasopharyngeal swab  samples.  Performance characteristics for  Bronchoalveolar lavage samples have been determined by  the [facility] laboratory. Other sample types are unacceptable.  Invalid results may be due to inhibiting substances in  the specimen and recollection should occur.          Purpose of COVID testing Diagnostic -PUI    Narrative:      For NP, Call laboratory for VTM NP Swab collection device.  Bronchoalveolar Lavage is submitted in a sterile container.  Diagnostic -PUI    Respiratory Pathogen Panel w/COVID-19, PCR [[id number]] Collected: 04/08/22 0248    Order Status: Canceled  Specimen: Nasopharyngeal     Legionella antigen, urine [[id number]] Collected: 04/08/22 0139    Order Status: Completed Specimen: Urine, Clean Catch Updated: 04/08/22 1335    Narrative:      ORDER#: [id number]                                    ORDERED BY: Pecola Lawless, AB  SOURCE: Urine, Clean Catch                           COLLECTED:  04/08/22 01:39  ANTIBIOTICS AT COLL.:                                RECEIVED :  04/08/22 09:44  Legionella, Rapid Urinary Antigen          FINAL       04/08/22 13:35  04/08/22   Negative for Legionella pneumophila Serogroup 1 Antigen             Limitations of Test:             1. Negative results do not exclude infection with Legionella                pneumophila Serogroup 1.             2. Does not detect other serogroups of L. pneumophila                or  other Legionella species.             Test Reference Range: Negative      Culture Blood Aerobic and Anaerobic [[id number]] Collected: 04/07/22 2326    Order Status: Completed Specimen: Blood, Venipuncture Updated: 04/11/22 1914    Narrative:      The order will result in two separate 8-59ml bottles  Please do NOT order repeat blood cultures if one has been  drawn within the last 48 hours  UNLESS concerned for  endocarditis  AVOID BLOOD CULTURE DRAWS FROM CENTRAL LINE IF POSSIBLE  Indications:->Sepsis  Indications:->Pneumonia  ORDER#: [id number]                                    ORDERED BY: WELDETSADIK, AB  SOURCE: Blood, Venipuncture                          COLLECTED:  04/07/22 23:26  ANTIBIOTICS AT COLL.:                                RECEIVED :  04/08/22 05:51  Culture Blood Aerobic and Anaerobic        PRELIM      04/11/22 06:21  04/09/22   No Growth after 1 day/s of incubation.  04/10/22   No Growth after 2 day/s of incubation.  04/11/22   No Growth after 3 day/s of incubation.      Culture Blood Aerobic and Anaerobic [[id number]] Collected: 04/07/22 2130    Order Status: Completed Specimen: Blood, Venipuncture Updated:  04/11/22 0021    Narrative:      The order will result in two separate 8-60ml bottles  Please do NOT order repeat blood cultures if one has been  drawn within the last 48 hours  UNLESS concerned for  endocarditis  AVOID BLOOD CULTURE DRAWS FROM CENTRAL LINE IF POSSIBLE  Indications:->Sepsis  Indications:->Pneumonia  ORDER#: [id number]                                    ORDERED BY: WELDETSADIK, AB  SOURCE: Blood, Venipuncture                          COLLECTED:  04/07/22 21:30  ANTIBIOTICS AT COLL.:                                RECEIVED :  04/07/22 23:57  Culture Blood Aerobic and Anaerobic        PRELIM      04/11/22 00:21  04/09/22   No Growth after 1 day/s of incubation.  04/10/22   No Growth after 2 day/s of incubation.  04/11/22   No Growth after 3 day/s of incubation.      COVID-19 (SARS-CoV-2) and Influenza A/B, NAA ([facility]iat Rapid) [[id number]] Collected: 04/07/22 0900    Order Status: Completed Specimen: Culturette from Nasopharyngeal Updated: 04/07/22 0925     Purpose of COVID testing Diagnostic -PUI     SARS-CoV-2 Specimen Source Nasal Swab     SARS CoV 2 Overall Result Not Detected     Comment: __________________________________________________  -A result of "Detected" indicates POSITIVE for the  presence of SARS CoV-2 RNA  -A result of "Not Detected" indicates NEGATIVE for the    presence of SARS CoV-2 RNA  __________________________________________________________  Test performed using the Roche cobas Liat SARS-CoV-2 assay. This assay is  only for use under the Food and Drug Administration's Emergency Use  Authorization. This is a real-time RT-PCR assay for the qualitative  detection of SARS-CoV-2 RNA. Viral nucleic acids may persist in vivo,  independent of viability. Detection of viral nucleic acid does not imply the  presence of infectious virus, or that virus nucleic acid is the cause of  clinical symptoms. Negative results do not preclude SARS-CoV-2 infection and  should not be used as the sole basis  for diagnosis, treatment or other  patient management decisions. Negative results must be combined with  clinical observations, patient history, and/or epidemiological information.  Invalid results may be due to inhibiting substances in the specimen and  recollection should occur. Please see Fact Sheets for patients and providers  located:  WirelessDSLBlog.no          Influenza A Not Detected     Influenza B Not Detected     Comment: Test performed using the Roche cobas Liat SARS-CoV-2 & Influenza A/B assay.  This assay is only for use under the Food and Drug Administration's  Emergency Use Authorization. This is a multiplex real-time RT-PCR assay  intended for the simultaneous in vitro qualitative detection and  differentiation of SARS-CoV-2, influenza A, and influenza B virus RNA. Viral  nucleic acids may persist in vivo, independent of viability. Detection of  viral nucleic acid does not imply the presence of infectious virus, or that  virus nucleic acid is the cause of clinical symptoms. Negative results do  not preclude SARS-CoV-2, influenza A, and/or influenza B infection and  should not be used as the sole basis for diagnosis, treatment or other  patient management decisions. Negative results must be combined with  clinical observations, patient history, and/or epidemiological information.  Invalid results may be due to inhibiting substances in the specimen and  recollection should occur. Please see Fact Sheets for patients and providers  located: http://www.rice.biz/.         Narrative:      o Collect and clearly label specimen type:  o PREFERRED-Upper respiratory specimen: One Nasal Swab in  Transport Media.  o Hand deliver to laboratory ASAP  Diagnostic -PUI             Patient Instructions   Discharge Diet: regular diet  Discharge Activity:  activity as tolerated    Follow Up Appointment:   Follow-up Information       Hilma Favors, MD Follow up.     Specialties: Internal Medicine, Pulmonary Disease  Contact information:  [address]  [address] [address]  [phone]               Roselie Skinner, MD Follow up in 1 week(s).    Specialty: Family Medicine  Contact information:  [address]  [address]  [place] [address]  [phone]                              Time spent examining patient, discussing with patient/family regarding hospital course, chart review, reconciling medications and discharge planning: 60 minutes.    Signed,  Denny Levy, PA-C  11:31 AM 04/11/2022     This note was generated by the [facility] EMR system/Dragon  speech recognition and may contain inherent errors or omissions not intended by the user. Grammatical errors, random word insertions, deletions, pronoun errors and incomplete sentences are occasional consequences of this technology due to software limitations. Not all errors are caught or corrected. If there are questions or concerns about the content of this note or information contained within the body of this dictation they should be addressed directly with the author for clarification.

## 2022-04-11 NOTE — Progress Notes (Signed)
04/11/22 1105   Discharge Disposition   Patient preference/choice provided? Yes   Physical Discharge Disposition Home   Mode of Transportation Car   Patient/Family/POA notified of transfer plan Yes   Patient agreeable to discharge plan/expected d/c date? Yes   Family/POA agreeable to discharge plan/expected d/c date? Yes   Bedside nurse notified of transport plan? Yes   CM Interventions   Multidisciplinary rounds/family meeting before d/c? Yes   Medicare Checklist   Is this a Medicare patient? Yes   Patient received 1st IMM Letter? Yes         [facility] BSN,RN  Clinical Case Manager I  [place] [facility]  Case Management Department  [address]  [place], [place] [address]  [phone]  Fax     [phone]  [email or web address]

## 2022-04-11 NOTE — Plan of Care (Signed)
Problem: Safety  Goal: Patient will be free from injury during hospitalization  Outcome: Completed  Goal: Patient will be free from infection during hospitalization  Outcome: Completed     Problem: Pain  Goal: Pain at adequate level as identified by patient  Outcome: Completed     Problem: Discharge Barriers  Goal: Patient will be discharged home or other facility with appropriate resources  Outcome: Completed     Problem: Compromised Hemodynamic Status  Goal: Vital signs and fluid balance maintained/improved  Outcome: Completed     Problem: Inadequate Gas Exchange  Goal: Adequate oxygenation and improved ventilation  Outcome: Completed  Goal: Patent Airway maintained  Outcome: Completed     Problem: Inadequate Tissue Perfusion-Venous  Goal: Tissue perfusion is adequate-venous  Outcome: Completed     Problem: Impaired Mobility  Goal: Mobility/Activity is maintained at optimal level for patient  Outcome: Completed     Problem: Nutrition  Goal: Nutritional intake is adequate  Outcome: Completed     Problem: Fluid and Electrolyte Imbalance/ Endocrine  Goal: Fluid and electrolyte balance are achieved/maintained  Outcome: Completed  Goal: Adequate hydration  Outcome: Completed     Problem: Nutrition  Goal: Nutritional intake is adequate  Outcome: Completed

## 2022-04-11 NOTE — Discharge Instr - AVS First Page (Addendum)
Reason for your Hospital Admission:  Rhinovirus with suspected underlying COPD with exacerbation, 4 mm left lung nodule    Instructions for after your discharge:  Continue slow steroid taper as directed, next dose tomorrow morning.  Can continue daily inhaler Trelegy for symptom control and albuterol inhaler as needed for any wheezing or shortness of breath.  Recommend wearing a mask for 7 to 10 days from symptom onset to avoid viral spread.  Will need to follow-up with Dr. Gaynelle Arabian, pulmonologist, in 6 to 8 weeks once recovered from viral illness and must stop all tobacco use for further pulmonary function testing to officially evaluate for suspected underlying COPD.  We will also need to have lung nodule monitored by pulmonary outpatient.  Highly recommend all tobacco cessation.  Follow-up with your primary care provider within the week for reevaluation and and overall management of chronic medical issues outpatient.  Be evaluated or return sooner for any worsening shortness of breath, high fevers, chills, chest pain, or any other concerns.

## 2022-04-11 NOTE — Progress Notes (Signed)
Discharge instructions given by SWAT RN.   Education given on s/s to report and when to go to ER. Patient verbalizes understanding. IV d/c per protocol. All belongings returned to patient.

## 2022-04-14 LAB — STREPTOCOCCUS PNEUMONIAE ANTIBODY, IGG, 12 SEROTYPES
Serotype 1(1): <0.3
Serotype 12F (12): <0.3
Serotype 14 (14): <0.3
Serotype 18C (56): <0.3
Serotype 19F (19): 0.7
Serotype 23F (23): 0.3
Serotype 3(3): <0.3
Serotype 4 (4): <0.3
Serotype 6B (26): <0.3
Serotype 7F (51): <0.3
Serotype 8 (8): 1.4
Serotype 9N (9): <0.3

## 2022-04-18 ENCOUNTER — Encounter (INDEPENDENT_AMBULATORY_CARE_PROVIDER_SITE_OTHER): Payer: Self-pay | Admitting: Family Medicine

## 2022-04-18 ENCOUNTER — Ambulatory Visit (INDEPENDENT_AMBULATORY_CARE_PROVIDER_SITE_OTHER): Payer: Self-pay | Admitting: Family Medicine

## 2022-04-18 VITALS — BP 121/80 | HR 98 | Temp 97.8°F

## 2022-04-18 DIAGNOSIS — I10 Essential (primary) hypertension: Secondary | ICD-10-CM

## 2022-04-18 DIAGNOSIS — A419 Sepsis, unspecified organism: Secondary | ICD-10-CM

## 2022-04-18 DIAGNOSIS — J449 Chronic obstructive pulmonary disease, unspecified: Secondary | ICD-10-CM

## 2022-04-18 NOTE — Progress Notes (Signed)
Subjective:     Patient ID: [name] is a 66 y.o. female.    Chief Complaint:  Chief Complaint   Patient presents with    Hospital Follow-up     HPI  [name] is here today   -here for hospital f/u  -was in hospital for one week  -diagnosed with sepsis due to pneumonia  -still feels weak  -has appointment with cardiologist.also has appointment with pulmonologist.  -stopped smoking since 2 weeks.  -works with handicapped adults.    PMH  HTN  Cholesterol  Chronic hep C.s/p treatment  Copd  Left knee replacement  osteopenia        03/26/2020    12:00 AM   PHQ2/PHQ9 SCORE    PHQ2 Score 0   PHQ9 Score 0       Review Of Systems:  Review of Systems   Constitutional: Negative.  Negative for chills and fever.   Respiratory: Negative.  Negative for cough, shortness of breath and wheezing.    Cardiovascular: Negative.  Negative for chest pain, palpitations and leg swelling.   Gastrointestinal: Negative.  Negative for abdominal pain.       Objective:   Vital signs  BP 121/80   Pulse 98   Temp 97.8 F (36.6 C) (Temporal)   SpO2 98%      Physical Exam  Physical Exam  Constitutional:       Appearance: Normal appearance.   Neck:      Thyroid: No thyromegaly.   Cardiovascular:      Rate and Rhythm: Normal rate and regular rhythm.      Heart sounds: No murmur heard.  Pulmonary:      Effort: Pulmonary effort is normal.      Breath sounds: Normal breath sounds. No wheezing or rhonchi.   Musculoskeletal:      Right lower leg: No edema.      Left lower leg: No edema.   Neurological:      Mental Status: She is alert and oriented to person, place, and time.            Visit Dx/Orders:   1. Sepsis, due to unspecified organism, unspecified whether acute organ dysfunction present    2. Chronic obstructive pulmonary disease, unspecified COPD type    3. Hypertension, unspecified type      Assessment/Plan:     1. Sepsis, due to unspecified organism, unspecified whether acute organ dysfunction present  Diagnosed with  sepsis in hospital due to pneumonia  Doing better.  Told pt fatigue will last for 3 to 4 weeks.  F/u in 2 months   2. Chronic obstructive pulmonary disease, unspecified COPD type   On trilogy  Has appointment with pulmonologist   3. Hypertension, unspecified type   BP well controlled  Continue with med       Follow-up:   Return in about 2 months (around 06/18/2022).      *Portions of this note may be dictated using Dragon naturally speaking voice recognition software. Variances in spelling and vocabulary are possible and unintentional. Not all areas may be caught/corrected. Please notify me of any discrepancies are noted or if the meaning of any statement is not correct/clear.*    Roselie Skinner, MD

## 2022-04-25 ENCOUNTER — Encounter (INDEPENDENT_AMBULATORY_CARE_PROVIDER_SITE_OTHER): Payer: Self-pay

## 2022-04-28 ENCOUNTER — Encounter (INDEPENDENT_AMBULATORY_CARE_PROVIDER_SITE_OTHER): Payer: Self-pay | Admitting: Cardiovascular Disease

## 2022-04-28 ENCOUNTER — Ambulatory Visit (INDEPENDENT_AMBULATORY_CARE_PROVIDER_SITE_OTHER): Payer: No Typology Code available for payment source | Admitting: Cardiovascular Disease

## 2022-04-28 VITALS — BP 118/80 | HR 112 | Ht 60.0 in | Wt 191.6 lb

## 2022-04-28 DIAGNOSIS — G4710 Hypersomnia, unspecified: Secondary | ICD-10-CM

## 2022-04-28 DIAGNOSIS — G478 Other sleep disorders: Secondary | ICD-10-CM

## 2022-04-28 DIAGNOSIS — R0683 Snoring: Secondary | ICD-10-CM

## 2022-04-28 DIAGNOSIS — Z8249 Family history of ischemic heart disease and other diseases of the circulatory system: Secondary | ICD-10-CM

## 2022-04-28 DIAGNOSIS — R0609 Other forms of dyspnea: Secondary | ICD-10-CM

## 2022-04-28 DIAGNOSIS — G4719 Other hypersomnia: Secondary | ICD-10-CM

## 2022-04-28 DIAGNOSIS — I2510 Atherosclerotic heart disease of native coronary artery without angina pectoris: Secondary | ICD-10-CM

## 2022-04-28 DIAGNOSIS — I10 Essential (primary) hypertension: Secondary | ICD-10-CM

## 2022-04-28 DIAGNOSIS — Z6837 Body mass index (BMI) 37.0-37.9, adult: Secondary | ICD-10-CM

## 2022-04-28 DIAGNOSIS — R000 Tachycardia, unspecified: Secondary | ICD-10-CM

## 2022-04-28 NOTE — Addendum Note (Signed)
Addended by: Grace Blight. on: 04/28/2022 08:30 PM     Modules accepted: Orders

## 2022-04-28 NOTE — Progress Notes (Addendum)
[facility] CARDI[facility] OFFICE CONSULTATION NOTE    HRT [facility] OFFICE  [facility] HEART [facility] OFFICE -CARDIOLOGY  [address]  [place] [address]  Dept: [phone]  Dept Fax: [phone]         Patient Name: [name]    Date of Visit:  April 28, 2022  Date of Birth: 1956-09-03  AGE: 66 y.o.  Medical Record #: [id number]  Requesting Physician: Roselie Skinner, MD      CHIEF COMPLAINT:  Tachycardia      HISTORY OF PRESENT ILLNESS    Ms. Gullickson is being seen today for cardiovascular evaluation at the request of Roselie Skinner, MD. She is a pleasant 66 y.o. female who is here because a recent CT scan noted "blockages" in her heart arteries.    She was at [facility] when she presented with her back hurting,  High HR noted, but it's been like that for years.  No CP/dyspnea with stairs.  He was at that point she had a CT scan without contrast that revealed coronary artery calcification.  Recent pneumonia.  Awakens with high HR.  No PND/orthopnea.  Snores loudly.  Nobody wants to sleep in her room when they travel with family.  Hoarse throat upon awakening, unrefreshed by sleep, daytime hypersomnolence.  No edema.  No LH/syncope.      My mother with heart disease, problem throughout her life.      Rarely uses her recent Albuterol Mdi.  On trelegy.  Has COPD.      PAST MEDICAL HISTORY: She has a past medical history of Abnormal vision, Anxiety, Arthritis, Carpal tunnel syndrome on both sides, Difficulty walking, Eczema, Fracture, Gastroesophageal reflux disease, History of gonorrhea, Hypertensive disorder, Lower back pain, Neck pain, Neuropathy, Pneumonia, and Vein disorder. She has a past surgical history that includes Ganglion cyst excision (Right, 2007); Knee arthroscopy (Right, 09/24/2014); Knee arthroscopy (Left, 03/2015); Colonoscopy (2016); Induced abortion; Vaginal delivery; ARTHROPLASTY, KNEE, TOTAL (Left, 12/14/2015); Tonsillectomy; REPAIR, UPPER EXTEMITY TENDON (Right,  03/02/2017); and ENDOSCOPIC CARPAL TUNNEL RELEASE (Right, 03/02/2017).    ALLERGIES:   Allergies   Allergen Reactions    Latex Rash       MEDICATIONS:   Patient's current medications were reviewed. ONLY Cardiac medications were updated unless others were addressed in assessment and plan.    Current Outpatient Medications:     albuterol sulfate HFA (PROVENTIL) 108 (90 Base) MCG/ACT inhaler, Inhale 2 puffs into the lungs every 6 (six) hours as needed for Wheezing or Shortness of Breath, Disp: 1 each, Rfl: 0    atorvastatin (LIPITOR) 20 MG tablet, Take 1 tablet (20 mg) by mouth daily, Disp: 90 tablet, Rfl: 2    ELDERBERRY PO, Take 3,200 mg by mouth, Disp: , Rfl:     fluticasone-umeclidinium-vilanterol (Trelegy Ellipta) 100-62.5-25 MCG/ACT Aerosol Pwdr, Breath Activated, Inhale 1 puff into the lungs daily, Disp: 1 each, Rfl: 0    losartan-hydrochlorothiazide (HYZAAR) 100-12.5 MG per tablet, TAKE 1 TABLET BY MOUTH EVERY DAY, Disp: 90 tablet, Rfl: 3    Magnesium 250 MG Tab, Take 1 tablet (250 mg) by mouth, Disp: , Rfl:     vitamin D (cholecalciferol) 25 MCG (1000 UT) tablet, 1 tablet (25 mcg), Disp: , Rfl:     Vitamin E 670 MG (1000 UT) Cap, 1 capsule, Disp: , Rfl:     famotidine (PEPCID) 20 MG tablet, Take 1 tablet (20 mg) by mouth daily (Patient not taking: Reported on 04/28/2022), Disp: 30 tablet, Rfl: 0  FAMILY HISTORY: family history includes Arthritis in her father; Cancer in her sister; Heart disease in her maternal grandmother; Hypertension in her father and mother; Kidney disease in her brother; Stroke in her mother.    SOCIAL HISTORY: She reports that she has been smoking cigarettes. She has a 7.50 pack-year smoking history. She has never used smokeless tobacco. She reports current drug use. She reports that she does not drink alcohol.    PHYSICAL EXAMINATION    Visit Vitals  Blood Pressure 118/80 (BP Site: Left arm, Patient Position: Sitting, Cuff Size: Medium)   Pulse (Abnormal) 112   Height 1.524 m (5')    Weight 86.9 kg (191 lb 9.6 oz)   Body Mass Index 37.42 kg/m        Constitutional: Cooperative, alert, no acute distress.  Neck: No carotid bruits, JVP normal.  Cardiac: Regular rate and rhythm, normal S1 and S2; no S3 or S4. No murmurs. No rubs, no gallops.  Pulmonary: Clear to auscultation bilaterally, no wheezing, no rhonchi, no rales.  Extremities: no edema.  Vascular: +2 pulses in radial artery bilaterally, 2+ pedal pulses bilaterally.      ECG: from 04/07/2022 that I personally reviewed showed sinus tachycardia, nonspecific ST changes,      LABS REVIEWED:   Lab Results   Component Value Date    WBC 11.49 (H) 04/11/2022    HGB 13.1 04/11/2022    HCT 40.6 04/11/2022    PLT 262 04/11/2022     Lab Results   Component Value Date    GLU 142 (H) 04/11/2022    BUN 14.0 04/11/2022    CREAT 0.8 04/11/2022    NA 139 04/11/2022    K 4.1 04/11/2022    CL 103 04/11/2022    CO2 25 04/11/2022    AST 16 04/07/2022    ALT 14 04/07/2022     Lab Results   Component Value Date    TSH 0.54 02/25/2021    HGBA1C 5.5 07/01/2019    BNP 77 04/07/2022     Lab Results   Component Value Date    CHOL 255 (H) 08/15/2021    TRIG 88 08/15/2021    HDL 41 08/15/2021    LDL 196 (H) 08/15/2021           IMPRESSION:   Ms. Nugent is a 66 y.o. female with the following problems:    Exertional dyspnea worrisome for anginal equivalent  Body mass index is 37.42 kg/m.   Sinus tachycardia with resting ECG abnormalities  Coronary calcification seen on CT  Strong family history of premature CAD on the mother side of her family  Marked hyperlipidemia  Bilateral carpal tunnel symptoms      RECOMMENDATIONS:    Given her strong risk factors and her symptoms, I think she should be referred for stress testing.  Her baseline sinus tachycardia precludes her from being able to exercise to get a significant enough burden of exertion to have an accurate ischemic evaluation.  Ideally, stress testing that allows attenuation correction given her high BMI would be  helpful.  Will see if we can get her a PET MPI and if not a pharmacologic SPECT MPI.    Echocardiogram to evaluate her IVC etc. given her tachycardia as well as her bilateral carpal tunnel symptoms  She is pleased with your management of her hyperlipidemia   Given her snoring, daytime hy third persomnolence, and awakening unrefreshed by sleep, recommend home sleep study  Follow-up in a few months  or sooner as is clinically indicated                                                     Orders Placed This Encounter   Procedures    PETCT Stress MPI    Echo 2D Complete    Home Sleep Test    APP Office Visit (HRT [place])     Once again it was a pleasure to participate in her care.  If you have any questions, please do not hesitate to contact our office.      With warmest of regards,    Francesco Runner. Roselle Locus, MD, [facility]        This note was generated by the Dragon speech recognition and may contain errors or omissions not intended by the user. Grammatical errors, random word insertions, deletions, pronoun errors, and incomplete sentences are occasional consequences of this technology due to software limitations. Not all errors are caught or corrected. If there are questions or concerns about the content of this note or information contained within the body of this dictation, they should be addressed directly with the author for clarification.

## 2022-05-24 ENCOUNTER — Inpatient Hospital Stay
Admission: RE | Admit: 2022-05-24 | Discharge: 2022-05-24 | Disposition: A | Discharge status: Home / Self Care | Payer: No Typology Code available for payment source | Source: Ambulatory Visit | Attending: Cardiovascular Disease | Admitting: Cardiovascular Disease

## 2022-05-24 ENCOUNTER — Other Ambulatory Visit (INDEPENDENT_AMBULATORY_CARE_PROVIDER_SITE_OTHER): Payer: Self-pay | Admitting: Internal Medicine

## 2022-05-24 ENCOUNTER — Encounter (INDEPENDENT_AMBULATORY_CARE_PROVIDER_SITE_OTHER): Payer: Self-pay | Admitting: Cardiovascular Disease

## 2022-05-24 DIAGNOSIS — K5730 Diverticulosis of large intestine without perforation or abscess without bleeding: Secondary | ICD-10-CM

## 2022-05-24 DIAGNOSIS — Z8249 Family history of ischemic heart disease and other diseases of the circulatory system: Secondary | ICD-10-CM | POA: Insufficient documentation

## 2022-05-24 DIAGNOSIS — F5104 Psychophysiologic insomnia: Secondary | ICD-10-CM

## 2022-05-24 DIAGNOSIS — Z803 Family history of malignant neoplasm of breast: Secondary | ICD-10-CM

## 2022-05-24 DIAGNOSIS — B009 Herpesviral infection, unspecified: Secondary | ICD-10-CM

## 2022-05-24 DIAGNOSIS — Z6837 Body mass index (BMI) 37.0-37.9, adult: Secondary | ICD-10-CM

## 2022-05-24 DIAGNOSIS — R0683 Snoring: Secondary | ICD-10-CM

## 2022-05-24 DIAGNOSIS — R9431 Abnormal electrocardiogram [ECG] [EKG]: Secondary | ICD-10-CM | POA: Insufficient documentation

## 2022-05-24 DIAGNOSIS — M170 Bilateral primary osteoarthritis of knee: Secondary | ICD-10-CM

## 2022-05-24 DIAGNOSIS — I10 Essential (primary) hypertension: Secondary | ICD-10-CM

## 2022-05-24 DIAGNOSIS — I2510 Atherosclerotic heart disease of native coronary artery without angina pectoris: Secondary | ICD-10-CM | POA: Insufficient documentation

## 2022-05-24 DIAGNOSIS — G4719 Other hypersomnia: Secondary | ICD-10-CM

## 2022-05-24 DIAGNOSIS — G5601 Carpal tunnel syndrome, right upper limb: Secondary | ICD-10-CM

## 2022-05-24 DIAGNOSIS — R0609 Other forms of dyspnea: Secondary | ICD-10-CM | POA: Insufficient documentation

## 2022-05-24 DIAGNOSIS — M542 Cervicalgia: Secondary | ICD-10-CM

## 2022-05-24 DIAGNOSIS — Z8619 Personal history of other infectious and parasitic diseases: Secondary | ICD-10-CM

## 2022-05-24 DIAGNOSIS — F17200 Nicotine dependence, unspecified, uncomplicated: Secondary | ICD-10-CM

## 2022-05-24 DIAGNOSIS — R000 Tachycardia, unspecified: Secondary | ICD-10-CM | POA: Insufficient documentation

## 2022-05-24 DIAGNOSIS — E7800 Pure hypercholesterolemia, unspecified: Secondary | ICD-10-CM | POA: Insufficient documentation

## 2022-05-24 DIAGNOSIS — M519 Unspecified thoracic, thoracolumbar and lumbosacral intervertebral disc disorder: Secondary | ICD-10-CM

## 2022-05-24 DIAGNOSIS — M1712 Unilateral primary osteoarthritis, left knee: Secondary | ICD-10-CM

## 2022-05-24 DIAGNOSIS — G8929 Other chronic pain: Secondary | ICD-10-CM

## 2022-05-24 DIAGNOSIS — F419 Anxiety disorder, unspecified: Secondary | ICD-10-CM

## 2022-05-24 DIAGNOSIS — J441 Chronic obstructive pulmonary disease with (acute) exacerbation: Secondary | ICD-10-CM

## 2022-05-24 DIAGNOSIS — F411 Generalized anxiety disorder: Secondary | ICD-10-CM

## 2022-05-24 MED ORDER — TECHNETIUM TC 99M TETROFOSMIN IV KIT
10.0000 | PACK | Freq: Once | INTRAVENOUS | Status: AC | PRN
Start: 2022-05-24 — End: 2022-05-24
  Administered 2022-05-24: 10 via INTRAVENOUS
  Filled 2022-05-24: qty 100

## 2022-05-24 MED ORDER — REGADENOSON 0.4 MG/5ML IV SOLN
0.4000 mg | Freq: Once | INTRAVENOUS | Status: AC | PRN
Start: 2022-05-24 — End: 2022-05-24
  Administered 2022-05-24: 0.4 mg via INTRAVENOUS
  Filled 2022-05-24: qty 5

## 2022-05-24 MED ORDER — TECHNETIUM TC 99M TETROFOSMIN IV KIT
35.0000 | PACK | Freq: Once | INTRAVENOUS | Status: AC | PRN
Start: 2022-05-24 — End: 2022-05-24
  Administered 2022-05-24: 35 via INTRAVENOUS
  Filled 2022-05-24: qty 100

## 2022-05-26 ENCOUNTER — Encounter (INDEPENDENT_AMBULATORY_CARE_PROVIDER_SITE_OTHER): Payer: Self-pay

## 2022-05-27 ENCOUNTER — Encounter (INDEPENDENT_AMBULATORY_CARE_PROVIDER_SITE_OTHER): Payer: Self-pay

## 2022-05-29 ENCOUNTER — Ambulatory Visit (INDEPENDENT_AMBULATORY_CARE_PROVIDER_SITE_OTHER): Payer: No Typology Code available for payment source

## 2022-05-29 ENCOUNTER — Encounter (INDEPENDENT_AMBULATORY_CARE_PROVIDER_SITE_OTHER): Payer: Self-pay | Admitting: Critical Care Medicine

## 2022-05-29 DIAGNOSIS — G4733 Obstructive sleep apnea (adult) (pediatric): Secondary | ICD-10-CM

## 2022-05-29 NOTE — Progress Notes (Signed)
SLEEP CENTER    HRT [facility] OFFICE  [facility] OFFICE -CARDIOLOGY  [address]  [place] [address]  Dept: [phone]  Dept Fax: [phone]     HOME SLEEP TESTING Device Set Up  Set-up Date: May 29, 2022  Set up By: Vivia Ewing    Patient NAME: [name]   DOB:  March 17, 1956  MRN:  [id number]    HST Unit #: [id number]  HST Scheduled Return Date: 05/30/22      All questions and concerns addressed.  Written and verbal instructions provided.     SIGNED:    Vivia Ewing

## 2022-05-30 ENCOUNTER — Other Ambulatory Visit (INDEPENDENT_AMBULATORY_CARE_PROVIDER_SITE_OTHER): Payer: Self-pay | Admitting: Family Medicine

## 2022-05-30 ENCOUNTER — Encounter (INDEPENDENT_AMBULATORY_CARE_PROVIDER_SITE_OTHER): Payer: Self-pay | Admitting: Pulmonary Disease

## 2022-05-30 MED ORDER — ATORVASTATIN CALCIUM 20 MG PO TABS
20.0000 mg | ORAL_TABLET | Freq: Every day | ORAL | 0 refills | Status: DC
Start: 2022-05-30 — End: 2022-08-01

## 2022-05-30 NOTE — Telephone Encounter (Signed)
Pt is requesting a refill    atorvastatin (LIPITOR) 20 MG tablet    losartan-hydrochlorothiazide (HYZAAR) 100-12.5 MG per tablet - order did not receive, please resend    Pharmacy:    [id number] - [place] - [address] Phone:  [phone]   Fax:  [phone]

## 2022-05-30 NOTE — Telephone Encounter (Signed)
Pt last seen 04/18/2022    Forwarded to PCP.

## 2022-06-02 ENCOUNTER — Encounter (INDEPENDENT_AMBULATORY_CARE_PROVIDER_SITE_OTHER): Payer: Self-pay | Admitting: Pulmonary Disease

## 2022-06-02 ENCOUNTER — Ambulatory Visit (INDEPENDENT_AMBULATORY_CARE_PROVIDER_SITE_OTHER): Payer: No Typology Code available for payment source

## 2022-06-02 ENCOUNTER — Encounter (INDEPENDENT_AMBULATORY_CARE_PROVIDER_SITE_OTHER): Payer: No Typology Code available for payment source | Admitting: Pulmonary Disease

## 2022-06-02 DIAGNOSIS — G4733 Obstructive sleep apnea (adult) (pediatric): Secondary | ICD-10-CM

## 2022-06-02 NOTE — Progress Notes (Signed)
SLEEP CENTER    HRT [facility] OFFICE  [facility] OFFICE -CARDIOLOGY  [address]  [place] [address]  Dept: [phone]  Dept Fax: [phone]     HOME SLEEP TESTING Device Set Up  Set-up Date: June 02, 2022  Set up By: Vivia Ewing    Patient NAME: [name]   DOB:  1956/01/29  MRN:  [id number]    HST Unit #: [id number]  HST Scheduled Return Date: 06/05/22      All questions and concerns addressed.  Written and verbal instructions provided.     SIGNED:    Vivia Ewing

## 2022-06-05 ENCOUNTER — Encounter (INDEPENDENT_AMBULATORY_CARE_PROVIDER_SITE_OTHER): Payer: Self-pay | Admitting: Pulmonary Disease

## 2022-06-20 ENCOUNTER — Ambulatory Visit
Admission: RE | Admit: 2022-06-20 | Discharge: 2022-06-20 | Disposition: A | Discharge status: Home / Self Care | Payer: No Typology Code available for payment source | Source: Ambulatory Visit | Attending: Cardiovascular Disease | Admitting: Cardiovascular Disease

## 2022-06-20 DIAGNOSIS — I10 Essential (primary) hypertension: Secondary | ICD-10-CM | POA: Insufficient documentation

## 2022-06-20 DIAGNOSIS — R0609 Other forms of dyspnea: Secondary | ICD-10-CM | POA: Insufficient documentation

## 2022-06-20 DIAGNOSIS — R000 Tachycardia, unspecified: Secondary | ICD-10-CM | POA: Insufficient documentation

## 2022-06-25 ENCOUNTER — Encounter (INDEPENDENT_AMBULATORY_CARE_PROVIDER_SITE_OTHER): Payer: Self-pay

## 2022-06-26 ENCOUNTER — Encounter (INDEPENDENT_AMBULATORY_CARE_PROVIDER_SITE_OTHER): Payer: Self-pay

## 2022-06-30 ENCOUNTER — Institutional Professional Consult (permissible substitution) (INDEPENDENT_AMBULATORY_CARE_PROVIDER_SITE_OTHER): Payer: No Typology Code available for payment source | Admitting: Critical Care Medicine

## 2022-07-26 ENCOUNTER — Encounter (INDEPENDENT_AMBULATORY_CARE_PROVIDER_SITE_OTHER): Payer: Self-pay

## 2022-07-27 ENCOUNTER — Encounter (INDEPENDENT_AMBULATORY_CARE_PROVIDER_SITE_OTHER): Payer: Self-pay

## 2022-08-01 ENCOUNTER — Encounter (INDEPENDENT_AMBULATORY_CARE_PROVIDER_SITE_OTHER): Payer: Self-pay | Admitting: General Practice

## 2022-08-01 ENCOUNTER — Ambulatory Visit (INDEPENDENT_AMBULATORY_CARE_PROVIDER_SITE_OTHER): Payer: No Typology Code available for payment source | Admitting: General Practice

## 2022-08-01 DIAGNOSIS — R0609 Other forms of dyspnea: Secondary | ICD-10-CM

## 2022-08-01 DIAGNOSIS — G4719 Other hypersomnia: Secondary | ICD-10-CM

## 2022-08-01 DIAGNOSIS — I2510 Atherosclerotic heart disease of native coronary artery without angina pectoris: Secondary | ICD-10-CM

## 2022-08-01 DIAGNOSIS — R0683 Snoring: Secondary | ICD-10-CM

## 2022-08-01 DIAGNOSIS — Z8249 Family history of ischemic heart disease and other diseases of the circulatory system: Secondary | ICD-10-CM

## 2022-08-01 DIAGNOSIS — I10 Essential (primary) hypertension: Secondary | ICD-10-CM

## 2022-08-01 DIAGNOSIS — Z6837 Body mass index (BMI) 37.0-37.9, adult: Secondary | ICD-10-CM

## 2022-08-01 DIAGNOSIS — R000 Tachycardia, unspecified: Secondary | ICD-10-CM

## 2022-08-01 DIAGNOSIS — G478 Other sleep disorders: Secondary | ICD-10-CM

## 2022-08-01 MED ORDER — ATORVASTATIN CALCIUM 20 MG PO TABS
20.0000 mg | ORAL_TABLET | Freq: Every day | ORAL | 0 refills | Status: DC
Start: 2022-08-01 — End: 2022-09-13

## 2022-08-01 MED ORDER — LOSARTAN POTASSIUM-HCTZ 100-12.5 MG PO TABS
1.0000 | ORAL_TABLET | Freq: Every day | ORAL | 0 refills | Status: DC
Start: 2022-08-01 — End: 2022-09-13

## 2022-08-01 NOTE — Progress Notes (Signed)
[facility] CARDIOLOG[facility] OFFICE PROGRESS NOTE    HRT [facility] OFFICE  [place] [facility] OFFICE -CARDIOLOGY  [address]  [place] [address]  Dept: [phone]  Dept Fax: [phone]       Patient Name: [name]    Date of Visit:  August 01, 2022  Date of Birth: [birth year]  AGE: 66 y.o.  Medical Record #: [id number]  Requesting Physician: Roselie Skinner, MD      CHIEF COMPLAINT: Coronary artery calcification seen on CT scan      HISTORY OF PRESENT ILLNESS:    She is a 65 y.o. female who presents today for results.  She has a history of COPD and hyperlipidemia.  She had a chest CT several months ago that showed coronary calcifications.  She was initially seen in August 2023 for evaluation of DOE, possible anginal equivalent.  She had a nuclear stress test on 05/24/2022 that was nonischemic.  She had an echocardiogram on 06/20/2022 that showed normal LV size and function, no significant valve abnormality.      PAST MEDICAL HISTORY: She has a past medical history of Abnormal vision, Anxiety, Arthritis, Carpal tunnel syndrome on both sides, Difficulty walking, Eczema, Fracture, Gastroesophageal reflux disease, History of gonorrhea, Hypertensive disorder, Lower back pain, Neck pain, Neuropathy, Pneumonia, and Vein disorder. She has a past surgical history that includes Ganglion cyst excision (Right, 2007); Knee arthroscopy (Right, 09/24/2014); Knee arthroscopy (Left, 03/2015); Colonoscopy (2016); Induced abortion; Vaginal delivery; ARTHROPLASTY, KNEE, TOTAL (Left, 12/14/2015); Tonsillectomy; REPAIR, UPPER EXTEMITY TENDON (Right, 03/02/2017); and ENDOSCOPIC CARPAL TUNNEL RELEASE (Right, 03/02/2017).    ALLERGIES:   Allergies   Allergen Reactions    Latex Rash       MEDICATIONS:   Current Outpatient Medications:     albuterol sulfate HFA (PROVENTIL) 108 (90 Base) MCG/ACT inhaler, Inhale 2 puffs into the lungs every 6 (six) hours as needed for Wheezing or Shortness of Breath, Disp: 1 each,  Rfl: 0    atorvastatin (LIPITOR) 20 MG tablet, Take 1 tablet (20 mg) by mouth daily, Disp: 90 tablet, Rfl: 0    ELDERBERRY PO, Take 3,200 mg by mouth, Disp: , Rfl:     fluticasone-umeclidinium-vilanterol (Trelegy Ellipta) 100-62.5-25 MCG/ACT Aerosol Pwdr, Breath Activated, Inhale 1 puff into the lungs daily, Disp: 1 each, Rfl: 0    losartan-hydrochlorothiazide (HYZAAR) 100-12.5 MG per tablet, TAKE 1 TABLET BY MOUTH EVERY DAY, Disp: 90 tablet, Rfl: 0    Magnesium 250 MG Tab, Take 1 tablet (250 mg) by mouth, Disp: , Rfl:     vitamin D (cholecalciferol) 25 MCG (1000 UT) tablet, 1 tablet (25 mcg), Disp: , Rfl:     Vitamin E 670 MG (1000 UT) Cap, 1 capsule, Disp: , Rfl:      FAMILY HISTORY: family history includes Arthritis in her father; Cancer in her sister; Heart disease in her maternal grandmother; Hypertension in her father and mother; Kidney disease in her brother; Stroke in her mother.    SOCIAL HISTORY: She reports that she has been smoking cigarettes. She has a 7.50 pack-year smoking history. She has never used smokeless tobacco. She reports current drug use. She reports that she does not drink alcohol.    PHYSICAL EXAMINATION    Visit Vitals  BP 128/84 (BP Site: Right arm, Patient Position: Sitting, Cuff Size: Large)   Pulse (!) 114   Wt 91.4 kg (201 lb 6.4 oz)   BMI 39.33 kg/m       Constitutional: Cooperative, alert, no  acute distress.  Neck: JVP normal.  Chest: Good respiratory effort.  Cardiac: Regular rate and rhythm, normal S1 and S2; no S3 or S4, no murmurs, no rubs, no gallops.  Pulmonary: Clear to auscultation bilaterally, no wheezing, no rhonchi, no rales.  Extremities: Warm. no edema.        LABS:   Lab Results   Component Value Date    WBC 11.49 (H) 04/11/2022    HGB 13.1 04/11/2022    HCT 40.6 04/11/2022    PLT 262 04/11/2022     Lab Results   Component Value Date    GLU 142 (H) 04/11/2022    BUN 14.0 04/11/2022    CREAT 0.8 04/11/2022    NA 139 04/11/2022    K 4.1 04/11/2022    CL 103 04/11/2022     CO2 25 04/11/2022    AST 16 04/07/2022    ALT 14 04/07/2022     Lab Results   Component Value Date    TSH 0.54 02/25/2021    HGBA1C 5.5 07/01/2019    BNP 77 04/07/2022     Lab Results   Component Value Date    CHOL 255 (H) 08/15/2021    TRIG 88 08/15/2021    HDL 41 08/15/2021    LDL 196 (H) 08/15/2021         IMPRESSION:   Ms. Grey is a 65 y.o. female with the following problems:    DOE, possibly due to deconditioning/obesity versus COPD  Coronary calcification with nonischemic nuclear stress test 05/24/22  COPD  Hyperlipidemia  BMI 39.3  Prior smoker, quit in June 2023  Echo 06/20/2022: EF 63%, no significant valve abnormality.  Tendency toward sinus tachycardia.      RECOMMENDATIONS:    Reassuring MPI and echo results.  Suspect her shortness of breath is combination of deconditioning/obesity and COPD.  Continue on atorvastatin 20 mg daily.  Repeat lipid panel.  Recommend uptitrating statin therapy to meet LDL goal of less than 70 considering coronary calcifications.  Discussed importance of weight loss which she seems to having trouble with.  I provided her with referral to [facility] for a more regimented weight loss program.  Follow-up in 1 year.                                                     No orders of the defined types were placed in this encounter.      No orders of the defined types were placed in this encounter.      SIGNED:    Dennison Bulla, FNP          This note was generated by the Dragon speech recognition and may contain errors or omissions not intended by the user. Grammatical errors, random word insertions, deletions, pronoun errors, and incomplete sentences are occasional consequences of this technology due to software limitations. Not all errors are caught or corrected. If there are questions or concerns about the content of this note or information contained within the body of this dictation, they should be addressed directly with the author for clarification.

## 2022-08-25 ENCOUNTER — Encounter (INDEPENDENT_AMBULATORY_CARE_PROVIDER_SITE_OTHER): Payer: Self-pay

## 2022-08-26 ENCOUNTER — Encounter (INDEPENDENT_AMBULATORY_CARE_PROVIDER_SITE_OTHER): Payer: Self-pay

## 2022-09-13 ENCOUNTER — Encounter (INDEPENDENT_AMBULATORY_CARE_PROVIDER_SITE_OTHER): Payer: Self-pay

## 2022-09-13 ENCOUNTER — Telehealth (INDEPENDENT_AMBULATORY_CARE_PROVIDER_SITE_OTHER): Payer: Self-pay | Admitting: Family Medicine

## 2022-09-13 ENCOUNTER — Ambulatory Visit (INDEPENDENT_AMBULATORY_CARE_PROVIDER_SITE_OTHER): Payer: No Typology Code available for payment source | Admitting: Critical Care Medicine

## 2022-09-13 ENCOUNTER — Telehealth (INDEPENDENT_AMBULATORY_CARE_PROVIDER_SITE_OTHER): Payer: Self-pay

## 2022-09-13 ENCOUNTER — Encounter (INDEPENDENT_AMBULATORY_CARE_PROVIDER_SITE_OTHER): Payer: Self-pay | Admitting: Critical Care Medicine

## 2022-09-13 VITALS — BP 140/80 | Ht 60.0 in | Wt 202.0 lb

## 2022-09-13 DIAGNOSIS — I10 Essential (primary) hypertension: Secondary | ICD-10-CM

## 2022-09-13 DIAGNOSIS — Z6839 Body mass index (BMI) 39.0-39.9, adult: Secondary | ICD-10-CM

## 2022-09-13 DIAGNOSIS — G478 Other sleep disorders: Secondary | ICD-10-CM

## 2022-09-13 DIAGNOSIS — G4733 Obstructive sleep apnea (adult) (pediatric): Secondary | ICD-10-CM

## 2022-09-13 MED ORDER — LOSARTAN POTASSIUM-HCTZ 100-12.5 MG PO TABS
1.0000 | ORAL_TABLET | Freq: Every day | ORAL | 3 refills | Status: DC
Start: 2022-09-13 — End: 2023-05-02

## 2022-09-13 MED ORDER — ATORVASTATIN CALCIUM 20 MG PO TABS
20.0000 mg | ORAL_TABLET | Freq: Every day | ORAL | 3 refills | Status: DC
Start: 2022-09-13 — End: 2023-05-02

## 2022-09-13 NOTE — Patient Instructions (Signed)
Dear Ms. Bruhn,    Below is the durable medical equipment (DME) company we have sent your positive airway pressure (PAP) referral.  Please reach out to them if you have not heard from them in roughly 2 weeks.  In the meantime, consider sleeping upright or on your side to help with your sleep disordered breathing.  Please avoid driving and other tasks requiring sustained vigilance if feeling sleepy.  Please aim for 7-9 hours of sleep nightly.  Please plan on seeing [place] 2 months after you start your new device.    POSITIVE AIRWAY PRESSURE (PAP) PROVIDERS    The durable medical equipment (DME) companies listed below provide PAP equipment ordered by your provider for the treatment of Sleep Apnea.  Please discuss your insurance coverage and charges directly with the DME Company.  Depending upon your insurance, units are often "rented" for several months. Please be aware that the insurance approval process can take from one to two weeks which is done before the DME company contacts you. Please reach out to your DME provider if you have not heard from them in this time.     Your chosen DME company's details are below, please reach out to them if you have not heard from them within two weeks. If you require any new supplies or encounter any technical difficulties, you will need to reach out to them directly.     Please be aware that due to current supply chain issues and also issues related to the [facility] device recall, there is a longer than usual wait time to get a new device.  This wait time may be as high as 8-12 weeks.    Due to worldwide microchip shortages your device may not have modem/Wi-Fi connection.  If this is the case, you will need to bring your device into our office for manual downloads when we meet.    **Your DME company**  [employer]    [phone]               Please note [facility] is placing the order on your behalf to your chosen DME supplier. Please contact your DME supplier  directly regarding Insurance queries, troubleshooting and supply orders.   We strongly suggest that you work only with companies with capability to provide face to face assistance.  Mask fittings and machine education information should be reviewed with a DME technician in person.  _________________________________________________________________________________________________________________________________________________________                         Virtually all PAP machines have built-in international power adapters and are 110 v and 220 v compatible.  Most masks are made of silicone and rarely cause allergic reactions.  Heated humidifiers increase comfort level for most PAP users, and may be set to desired humidity level.  Most machines have a "flex" or expiratory pressure relief setting that eases exhaling and can be adjusted based on comfort.  Most machines have software that allows for Wi-Fi or cellular based downloading of useful clinical information. Due to recent supply chain issues, you may receive a device which DOES NOT have Wi-Fi or cellular based capabilities.     WEB BASED RESOURCES:   [email or web address], [email or web address]    You will need a sleep medicine follow up visit with a Sleep Provider 8 weeks following sleep equipment set up.  Please call the [facility] scheduling department at [phone] to schedule this appointment as soon as you have received  your machine.  These visits are often required by most insurance plans.

## 2022-09-13 NOTE — Telephone Encounter (Signed)
Sent today by cardiology office.

## 2022-09-13 NOTE — Telephone Encounter (Signed)
PT calling requesting refill of atorvastatin and losartan with HCTZ. Pt acknowledges recommendation for lipid panel lab order with plans for completion based on recommendations at last OV. Refill fulfilled per patient's request. Pt has no further questions or concerns at this time.

## 2022-09-13 NOTE — Telephone Encounter (Signed)
Name, strength, directions of requested refill(s):  losartan-hydrochlorothiazide (HYZAAR) 100-12.5 MG per tablet   atorvastatin (LIPITOR) 20 MG tablet   How much medication is remaining:   0  Pharmacy to send refill to or patient to pick up rx from office (mark requested pharmacy in BOLD):      [id number] - [place] [place] - [address]  [address] [place] [address]  Phone: [phone] Fax: [phone]        Please mark "X" next to the preferred call back number:    Mobile: [phone] (mobile) X   Home: [phone] (home)    Work: @WORKPHONE @        Medication refill request, see above. Thank you   Patient has been informed that medication refill requests should be called in up to one week prior to running out of medication.    Additional Notes:  Next Visit: 09/26/2022

## 2022-09-13 NOTE — Progress Notes (Signed)
SLEEP MEDICINE OFFICE CONSULTATION NOTE    [facility] OFFICE  [facility] OFFICE -CARDIOLOGY  [address]  [place] [address]  Dept: [phone]  Dept Fax: [phone]         Patient Name: [name]    Date of Visit:  September 13, 2022  Date of Birth: 11/04/1955  AGE: 66 y.o.  Medical Record #: [id number]  Requesting Physician: Roselie Skinner, MD    CHIEF COMPLAINT:  SLEEP CONSULT and Results (Home sleep study)      HISTORY OF PRESENT ILLNESS    Shuntavia Yerby is being seen today for sleep medicine evaluation at the [facility] at the request of Dr. Youlanda Mighty.    Ms. Teem is a pleasant 66 y.o. female with history of hypertension whom I was able to see today for sleep medicine consultation.    She has history of snoring and hypertension and restless sleep.  She is typically sleeping from 11 PM or 12 AM until 6 AM or so.  Does not drink caffeine or alcohol.    Stop/Bang: 4    PAST MEDICAL HISTORY: She has a past medical history of Abnormal vision, Anxiety, Arthritis, Carpal tunnel syndrome on both sides, Difficulty walking, Eczema, Fracture, Gastroesophageal reflux disease, History of gonorrhea, Hypertensive disorder, Lower back pain, Neck pain, Neuropathy, Pneumonia, and Vein disorder. She has a past surgical history that includes Ganglion cyst excision (Right, 2007); Knee arthroscopy (Right, 09/24/2014); Knee arthroscopy (Left, 03/2015); Colonoscopy (2016); Induced abortion; Vaginal delivery; ARTHROPLASTY, KNEE, TOTAL (Left, 12/14/2015); Tonsillectomy; REPAIR, UPPER EXTEMITY TENDON (Right, 03/02/2017); and ENDOSCOPIC CARPAL TUNNEL RELEASE (Right, 03/02/2017).    ALLERGIES:   Allergies   Allergen Reactions    Latex Rash       MEDICATIONS:   Patient's current medications were reviewed. ONLY Sleep Medicine related medications were updated unless others were addressed in assessment and plan.  Current Outpatient Medications   Medication  Instructions    albuterol sulfate HFA (PROVENTIL) 108 (90 Base) MCG/ACT inhaler 2 puffs, Inhalation, Every 6 hours PRN    atorvastatin (LIPITOR) 20 mg, Oral, Daily    ELDERBERRY PO 3,200 mg, Oral    fluticasone-umeclidinium-vilanterol (Trelegy Ellipta) 100-62.5-25 MCG/ACT Aerosol Pwdr, Breath Activated 1 puff, Inhalation, Daily    losartan-hydrochlorothiazide (HYZAAR) 100-12.5 MG per tablet 1 tablet, Oral, Daily    Magnesium 250 mg, Oral    vitamin D (cholecalciferol) 25 MCG (1000 UT) tablet 1 tablet    Vitamin E 670 MG (1000 UT) Cap 1 capsule       FAMILY HISTORY: family history includes Arthritis in her father; Cancer in her sister; Heart disease in her maternal grandmother; Hypertension in her father and mother; Kidney disease in her brother; Stroke in her mother.      SOCIAL HISTORY: She reports that she has been smoking cigarettes. She has a 7.50 pack-year smoking history. She has never used smokeless tobacco. She reports current drug use. She reports that she does not drink alcohol.    REVIEW OF SYSTEMS:    No data recorded      PHYSICAL EXAMINATION    Visit Vitals  BP 140/80 (BP Site: Left arm, Patient Position: Sitting, Cuff Size: Large)   Ht 1.524 m (5')   Wt 91.6 kg (202 lb)   BMI 39.45 kg/m        Constitutional: Cooperative, alert and oriented,well developed, well nourished, in no acute distress.,   Head: Normocephalic, normal hair pattern  Eyes:  conjunctivae and lids normal   Chest: Normal respiration without extremus   Neurological: No gross motor or sensory deficits noted  Psych: affect appropriate, oriented to time, person and place.    LABS:   Lab Results   Component Value Date    WBC 11.49 (H) 04/11/2022    HGB 13.1 04/11/2022    HCT 40.6 04/11/2022    PLT 262 04/11/2022     No results found for: "IRON", "TIBC", "FERRITIN"  Lab Results   Component Value Date    TSH 0.54 02/25/2021    HGBA1C 5.5 07/01/2019    BNP 77 04/07/2022       ASSESSMENT and PLAN:    Severe obstructive Sleep Apnea/nocturnal  hypoxemia:  06/02/2022 HST: AHI 34/RDI 35/average O2 91%/low O2 67%/time below 90% = 83 minutes  We discussed the pathophysiology of OSA in addition to potential treatment options including but not limited to positive airway pressure (PAP), oral appliance for sleep apnea, eXcite OSA, upper airway procedures and the like.   1. We discussed how body position during sleep, weight, alcohol and certain anxiety/muscle medications can affect sleep apnea.   2. The patient was advised not to drive or participate in activities requiring sustained vigilance if feeling sleepy.   3. In the mean time, head of bed elevation / side sleeping position was recommended.    Hypertension:  We discussed the relationship between untreated OSA and cardiovascular processes such as hypertension, dysrhythmias, stroke, CAD and the like and how treating moderate to severe OSA could help reduce future risks for occurrence  1. Stable and following with our Cardiology team  2. I have reviewed their cardiology notes  3.  06/20/2022 echocardiogram: EF 63%    Weight:  We discussed the relationship of increased weight as it relates to obstruction sleep apnea and how maintenance of ideal body weight could likely improve sleep apnea severity.    Reviewed prior testing, cardiology notes, echocardiogram.  Combination sleep apnea hypertension can increase cardiovascular risk.    Thank you, Marquis Lunch, for allowing [place] to participate in Ms. Sabala care.                                               Orders Placed This Encounter   Procedures    C-Pap (DME)    DME Set Up Visit ([facility]rt [place])    Sleep APP Visit (HRT [place])       No orders of the defined types were placed in this encounter.        SIGNED:    Cristi Loron, MD         This note was generated by the Dragon speech recognition and may contain errors or omissions not intended by the user. Grammatical errors, random word insertions, deletions, pronoun errors, and incomplete sentences are occasional consequences of this  technology due to software limitations. Not all errors are caught or corrected. If there are questions or concerns about the content of this note or information contained within the body of this dictation, they should be addressed directly with the author for clarification.

## 2022-09-25 ENCOUNTER — Encounter (INDEPENDENT_AMBULATORY_CARE_PROVIDER_SITE_OTHER): Payer: Self-pay

## 2022-09-26 ENCOUNTER — Encounter (INDEPENDENT_AMBULATORY_CARE_PROVIDER_SITE_OTHER): Payer: Self-pay

## 2022-09-26 ENCOUNTER — Ambulatory Visit (INDEPENDENT_AMBULATORY_CARE_PROVIDER_SITE_OTHER): Payer: No Typology Code available for payment source | Admitting: Family Medicine

## 2022-09-26 NOTE — Progress Notes (Deleted)
Subjective:     Patient ID: [name] is a 67 y.o. female.    Chief Complaint:  No chief complaint on file.    HPI  [name] is here today       PMH  HTN  Cholesterol  Chronic hep C.s/p treatment  Copd  Left knee replacement  Osteopenia    specialist          03/26/2020    12:00 AM   PHQ2/PHQ9 SCORE    PHQ2 Score 0   PHQ9 Score 0       Review Of Systems:  ROS    Objective:   Vital signs  There were no vitals taken for this visit.     Physical Exam  Physical Exam       Visit Dx/Orders:   There are no diagnoses linked to this encounter.    Assessment/Plan:   No diagnosis found.    Follow-up:   No follow-ups on file.      *Portions of this note may be dictated using Dragon naturally speaking voice recognition software. Variances in spelling and vocabulary are possible and unintentional. Not all areas may be caught/corrected. Please notify me of any discrepancies are noted or if the meaning of any statement is not correct/clear.*    Tyson Babinski, MD

## 2022-10-26 ENCOUNTER — Encounter (INDEPENDENT_AMBULATORY_CARE_PROVIDER_SITE_OTHER): Payer: Self-pay

## 2022-10-27 ENCOUNTER — Encounter (INDEPENDENT_AMBULATORY_CARE_PROVIDER_SITE_OTHER): Payer: Self-pay

## 2022-11-08 ENCOUNTER — Ambulatory Visit (INDEPENDENT_AMBULATORY_CARE_PROVIDER_SITE_OTHER): Payer: No Typology Code available for payment source | Admitting: Family Medicine

## 2022-11-08 ENCOUNTER — Encounter (INDEPENDENT_AMBULATORY_CARE_PROVIDER_SITE_OTHER): Payer: Self-pay | Admitting: Family Medicine

## 2022-11-08 VITALS — BP 131/76 | HR 105 | Temp 97.7°F | Wt 204.4 lb

## 2022-11-08 DIAGNOSIS — I10 Essential (primary) hypertension: Secondary | ICD-10-CM

## 2022-11-08 DIAGNOSIS — D72829 Elevated white blood cell count, unspecified: Secondary | ICD-10-CM

## 2022-11-08 DIAGNOSIS — E7800 Pure hypercholesterolemia, unspecified: Secondary | ICD-10-CM

## 2022-11-08 DIAGNOSIS — N6310 Unspecified lump in the right breast, unspecified quadrant: Secondary | ICD-10-CM

## 2022-11-08 LAB — LIPID PANEL
Cholesterol / HDL Ratio: 4 Index
Cholesterol: 172 mg/dL (ref 0–199)
HDL: 43 mg/dL (ref 40–9999)
LDL Calculated: 117 mg/dL — ABNORMAL HIGH (ref 0–99)
Triglycerides: 62 mg/dL (ref 34–149)
VLDL Calculated: 12 mg/dL (ref 10–40)

## 2022-11-08 LAB — CBC AND DIFFERENTIAL
Absolute NRBC: 0 10*3/uL (ref 0.00–0.00)
Basophils Absolute Automated: 0.04 10*3/uL (ref 0.00–0.08)
Basophils Automated: 0.5 %
Eosinophils Absolute Automated: 0.22 10*3/uL (ref 0.00–0.44)
Eosinophils Automated: 2.6 %
Hematocrit: 45 % — ABNORMAL HIGH (ref 34.7–43.7)
Hgb: 14.1 g/dL (ref 11.4–14.8)
Immature Granulocytes Absolute: 0.01 10*3/uL (ref 0.00–0.07)
Immature Granulocytes: 0.1 %
Instrument Absolute Neutrophil Count: 4.72 10*3/uL (ref 1.10–6.33)
Lymphocytes Absolute Automated: 2.72 10*3/uL (ref 0.42–3.22)
Lymphocytes Automated: 32.4 %
MCH: 29.1 pg (ref 25.1–33.5)
MCHC: 31.3 g/dL — ABNORMAL LOW (ref 31.5–35.8)
MCV: 92.8 fL (ref 78.0–96.0)
MPV: 11.4 fL (ref 8.9–12.5)
Monocytes Absolute Automated: 0.68 10*3/uL (ref 0.21–0.85)
Monocytes: 8.1 %
Neutrophils Absolute: 4.72 10*3/uL (ref 1.10–6.33)
Neutrophils: 56.3 %
Nucleated RBC: 0 /100 WBC (ref 0.0–0.0)
Platelets: 296 10*3/uL (ref 142–346)
RBC: 4.85 10*6/uL (ref 3.90–5.10)
RDW: 14 % (ref 11–15)
WBC: 8.39 10*3/uL (ref 3.10–9.50)

## 2022-11-08 LAB — COMPREHENSIVE METABOLIC PANEL
ALT: 16 U/L (ref 0–55)
AST (SGOT): 15 U/L (ref 5–41)
Albumin/Globulin Ratio: 1.2 (ref 0.9–2.2)
Albumin: 4 g/dL (ref 3.5–5.0)
Alkaline Phosphatase: 76 U/L (ref 37–117)
Anion Gap: 8 (ref 5.0–15.0)
BUN: 13 mg/dL (ref 7.0–21.0)
Bilirubin, Total: 0.4 mg/dL (ref 0.2–1.2)
CO2: 27 mEq/L (ref 17–29)
Calcium: 9.7 mg/dL (ref 8.5–10.5)
Chloride: 107 mEq/L (ref 99–111)
Creatinine: 0.9 mg/dL (ref 0.4–1.0)
Globulin: 3.4 g/dL (ref 2.0–3.6)
Glucose: 98 mg/dL (ref 70–100)
Potassium: 4.2 mEq/L (ref 3.5–5.3)
Protein, Total: 7.4 g/dL (ref 6.0–8.3)
Sodium: 142 mEq/L (ref 135–145)
eGFR: >60 mL/min/{1.73_m2} (ref >=60)

## 2022-11-08 LAB — THYROID STIMULATING HORMONE (TSH), REFLEX ON ABNORMAL TO FREE T4, SERUM: TSH, Abn Reflex to Free T4, Serum: 0.8 u[IU]/mL (ref 0.35–4.94)

## 2022-11-08 LAB — HEMOLYSIS INDEX: Hemolysis Index: 6 Index (ref 0–24)

## 2022-11-08 MED ORDER — MELOXICAM 15 MG PO TABS
15.0000 mg | ORAL_TABLET | Freq: Every day | ORAL | 0 refills | Status: DC
Start: 2022-11-08 — End: 2023-07-26

## 2022-11-08 MED ORDER — CEPHALEXIN 500 MG PO CAPS
500.0000 mg | ORAL_CAPSULE | Freq: Four times a day (QID) | ORAL | 0 refills | Status: AC
Start: 2022-11-08 — End: 2022-11-15

## 2022-11-08 NOTE — Progress Notes (Signed)
Subjective:     Patient ID: [name] is a 67 y.o. female.    Chief Complaint:  Chief Complaint   Patient presents with    Mass     Pink red "pimple' on right breast     HPI  [name] is here today   -c/o bump on right breast for 3 days  -painful.no discharge.  -no fever  -wants labs done        PMH  HTN  Cholesterol  Chronic hep C.s/p treatment  Copd  Left knee replacement  osteopenia          03/26/2020    12:00 AM   PHQ2/PHQ9 SCORE    PHQ2 Score 0   PHQ9 Score 0       Review Of Systems:  Review of Systems   Constitutional: Negative.    Skin:         Breast mass       Objective:   Vital signs  BP 131/76   Pulse (!) 105   Temp 97.7 F (36.5 C) (Temporal)   Wt 92.7 kg (204 lb 6.4 oz)   SpO2 100%   BMI 39.92 kg/m      Physical Exam  Physical Exam  Constitutional:       Appearance: Normal appearance.   Skin:     Comments: Right breast - 2 cm mass palpable.tender.no discharge.lower quadrant.   Neurological:      Mental Status: She is alert.            Visit Dx/Orders:   1. Mass of right breast, unspecified quadrant  - cephALEXin (KEFLEX) 500 MG capsule; Take 1 capsule (500 mg) by mouth 4 (four) times daily for 7 days  Dispense: 28 capsule; Refill: 0  - meloxicam (MOBIC) 15 MG tablet; Take 1 tablet (15 mg) by mouth daily  Dispense: 10 tablet; Refill: 0  - US Breast Bilateral; Future    2. Primary hypertension  - TSH, Abn Reflex to Free T4, Serum    3. Hypercholesteremia  - Lipid panel  - Comprehensive metabolic panel    4. Leukocytosis, unspecified type  - CBC and differential      Assessment/Plan:     1. Mass of right breast, unspecified quadrant   Most likely breast abscess  Started on keflex and meloxicam  Breast US ordered to r/o mass   2. Primary hypertension   BP controlled  On med  Labs ordered   3. Hypercholesteremia   Lipid ordered   4. Leukocytosis, unspecified type   Lab ordered       Follow-up:   Return in about 3 months (around 02/06/2023).      *Portions of this note may be  dictated using Dragon naturally speaking voice recognition software. Variances in spelling and vocabulary are possible and unintentional. Not all areas may be caught/corrected. Please notify me of any discrepancies are noted or if the meaning of any statement is not correct/clear.*    Tyson Babinski, MD

## 2022-11-24 ENCOUNTER — Encounter (INDEPENDENT_AMBULATORY_CARE_PROVIDER_SITE_OTHER): Payer: Self-pay

## 2022-11-25 ENCOUNTER — Encounter (INDEPENDENT_AMBULATORY_CARE_PROVIDER_SITE_OTHER): Payer: Self-pay

## 2022-12-16 ENCOUNTER — Emergency Department: Payer: No Typology Code available for payment source

## 2022-12-16 ENCOUNTER — Emergency Department
Admission: EM | Admit: 2022-12-16 | Discharge: 2022-12-16 | Disposition: A | Discharge status: Home / Self Care | Payer: No Typology Code available for payment source | Attending: Emergency Medicine | Admitting: Emergency Medicine

## 2022-12-16 DIAGNOSIS — R0602 Shortness of breath: Secondary | ICD-10-CM

## 2022-12-16 DIAGNOSIS — J111 Influenza due to unidentified influenza virus with other respiratory manifestations: Secondary | ICD-10-CM | POA: Insufficient documentation

## 2022-12-16 DIAGNOSIS — J441 Chronic obstructive pulmonary disease with (acute) exacerbation: Secondary | ICD-10-CM | POA: Insufficient documentation

## 2022-12-16 LAB — GROUP A STREP, RAPID ANTIGEN: Group A Strep, Rapid Antigen: NEGATIVE

## 2022-12-16 LAB — COVID-19 (SARS-COV-2) & INFLUENZA  A/B, NAA (ROCHE LIAT)
Influenza A: DETECTED — AB
Influenza B: NOT DETECTED
SARS-CoV-2 Overall Result: NOT DETECTED

## 2022-12-16 MED ORDER — IPRATROPIUM BROMIDE 0.02 % IN SOLN
0.5000 mg | Freq: Once | RESPIRATORY_TRACT | Status: AC
Start: 2022-12-16 — End: 2022-12-16
  Administered 2022-12-16: 0.5 mg via RESPIRATORY_TRACT
  Filled 2022-12-16: qty 2.5

## 2022-12-16 MED ORDER — IBUPROFEN 600 MG PO TABS
600.0000 mg | ORAL_TABLET | Freq: Once | ORAL | Status: DC
Start: 2022-12-16 — End: 2022-12-16
  Filled 2022-12-16: qty 1

## 2022-12-16 MED ORDER — OSELTAMIVIR PHOSPHATE 75 MG PO CAPS
75.0000 mg | ORAL_CAPSULE | Freq: Two times a day (BID) | ORAL | 0 refills | Status: DC
Start: 2022-12-16 — End: 2022-12-22

## 2022-12-16 MED ORDER — PREDNISONE 20 MG PO TABS
40.0000 mg | ORAL_TABLET | Freq: Once | ORAL | Status: AC
Start: 2022-12-16 — End: 2022-12-16
  Administered 2022-12-16: 40 mg via ORAL
  Filled 2022-12-16: qty 2

## 2022-12-16 MED ORDER — PREDNISONE 20 MG PO TABS
40.0000 mg | ORAL_TABLET | Freq: Every day | ORAL | 0 refills | Status: AC
Start: 2022-12-16 — End: 2022-12-20

## 2022-12-16 MED ORDER — ALBUTEROL SULFATE (2.5 MG/3ML) 0.083% IN NEBU: 2.5000 mg | INHALATION_SOLUTION | RESPIRATORY_TRACT | 0 refills | Status: DC | PRN

## 2022-12-16 MED ORDER — NEBULIZER MISC
0 refills | Status: AC
Start: 2022-12-16 — End: ?

## 2022-12-16 MED ORDER — OSELTAMIVIR PHOSPHATE 75 MG PO CAPS
75.0000 mg | ORAL_CAPSULE | Freq: Once | ORAL | Status: AC
Start: 2022-12-16 — End: 2022-12-16
  Administered 2022-12-16: 75 mg via ORAL
  Filled 2022-12-16: qty 1

## 2022-12-16 MED ORDER — ALBUTEROL SULFATE (2.5 MG/3ML) 0.083% IN NEBU
5.0000 mg | INHALATION_SOLUTION | Freq: Once | RESPIRATORY_TRACT | Status: AC
Start: 2022-12-16 — End: 2022-12-16
  Administered 2022-12-16: 5 mg via RESPIRATORY_TRACT
  Filled 2022-12-16: qty 6

## 2022-12-16 MED ORDER — ALBUTEROL SULFATE HFA 108 (90 BASE) MCG/ACT IN AERS
2.0000 | INHALATION_SPRAY | RESPIRATORY_TRACT | 0 refills | Status: AC | PRN
Start: 2022-12-16 — End: 2023-10-18

## 2022-12-16 MED ORDER — AZITHROMYCIN 250 MG PO TABS
250.0000 mg | ORAL_TABLET | Freq: Every day | ORAL | 0 refills | Status: DC
Start: 2022-12-16 — End: 2022-12-16

## 2022-12-16 NOTE — ED Provider Notes (Signed)
[place] [facility] EMERGENCY DEPARTMENT ATTENDING PHYSICIAN NOTE     CLINICAL SUMMARY          Diagnosis:    .     Final diagnoses:   COPD exacerbation   Influenza         Disposition:       ED Disposition       ED Disposition   Discharge    Condition   --    Date/Time   Sat Dec 16, 2022  6:49 PM    Comment   [place] discharge to home/self care.    Condition at disposition: Stable                    Discharge Prescriptions       Medication Sig Dispense Auth. Provider    predniSONE (DELTASONE) 20 MG tablet Take 2 tablets (40 mg) by mouth daily for 4 days 8 tablet Vanna Scotland, MD    albuterol sulfate HFA (PROVENTIL) 108 (90 Base) MCG/ACT inhaler Inhale 2 puffs into the lungs every 4 (four) hours as needed for Wheezing or Shortness of Breath Dispense with spacer 1 each Vanna Scotland, MD    Nebulizer Misc Dispense one nebulizer machine to include mask and tubing 1 each Vanna Scotland, MD    albuterol (PROVENTIL) (2.5 MG/3ML) 0.083% nebulizer solution Take 3 mLs (2.5 mg) by nebulization every 4 (four) hours as needed for Wheezing or Shortness of Breath 90 mL Vanna Scotland, MD           oseltamivir (TAMIFLU) 75 MG capsule Take 1 capsule (75 mg) by mouth 2 (two) times daily for 5 days 10 capsule Vanna Scotland, MD                                              CLINICAL INFORMATION        HPI:         67 y.o. female p/w cough and generalized weakness  Ongoing for about a day  Cough is productive (white, no blood)  Smoker  Hx of COPD (not on home oxygen)  No fevers      History obtained from:  Patient           ROS:      Pertinent Positives and Negatives noted in the HPI.    All other systems reviewed and are negative        Physical Exam:      Pulse (!) 119  BP 162/87  Resp 19  SpO2 97 %  Temp 98.8 F (37.1 C)    Nursing note and vitals reviewed.    Constitutional:  Frequent bronchospastic cough during exam. Afebrile.  Psych:  Normal affect. Cooperative.   Eyes: No conjunctival injection. No discharge.  Ears, Nose,  Mouth and Throat:Tolerating secretions.  No hoarseness.  Respiratory: Faint wheezing b/l. No retractions.   Cardiovascular:  tachycardic, well-perfused  Abdomen: Soft. Non-tender    Musculoskeletal:           Neck:           Back:            Upper Extremity:           Lower Extremity:   Neurological:   Integumentary:   GU:   Lymphatic:  PAST HISTORY        Primary Care Provider: Tyson Babinski, MD        PMH/PSH:      [name] has a past medical history of Abnormal vision, Anxiety, Arthritis, Carpal tunnel syndrome on both sides, Difficulty walking, Eczema, Fracture, Gastroesophageal reflux disease, History of gonorrhea, Hypertensive disorder, Lower back pain, Neck pain, Neuropathy, Pneumonia, and Vein disorder.  [name] has a past surgical history that includes Ganglion cyst excision (Right, 2007); Knee arthroscopy (Right, 09/24/2014); Knee arthroscopy (Left, 03/2015); Colonoscopy (2016); Induced abortion; Vaginal delivery; ARTHROPLASTY, KNEE, TOTAL (Left, 12/14/2015); Tonsillectomy; REPAIR, UPPER EXTEMITY TENDON (Right, 03/02/2017); and ENDOSCOPIC CARPAL TUNNEL RELEASE (Right, 03/02/2017).      Social/Family History:      [name] reports that [name] has been smoking cigarettes. [name] has a 7.5 pack-year smoking history. [name] has never used smokeless tobacco. [name] reports current drug use. [name] reports that [name] does not drink alcohol.  Her family history includes Arthritis in her father; Cancer in her sister; Heart disease in her maternal grandmother; Hypertension in her father and mother; Kidney disease in her brother; Stroke in her mother.      Listed Medications on Arrival:    .     Home Medications       Med List Status: In Progress Set By: Thom Chimes, RN at 12/16/2022  5:58 PM              albuterol sulfate HFA (PROVENTIL) 108 (90 Base) MCG/ACT inhaler     Inhale 2 puffs into the lungs every 6 (six) hours as needed for Wheezing or Shortness of Breath     atorvastatin (LIPITOR) 20 MG tablet     Take 1  tablet (20 mg) by mouth daily     ELDERBERRY PO     Take 3,200 mg by mouth     fluticasone-umeclidinium-vilanterol (Trelegy Ellipta) 100-62.5-25 MCG/ACT Aerosol Pwdr, Breath Activated     Inhale 1 puff into the lungs daily     losartan-hydrochlorothiazide (HYZAAR) 100-12.5 MG per tablet     Take 1 tablet by mouth daily     Magnesium 250 MG Tab     Take 1 tablet (250 mg) by mouth     meloxicam (MOBIC) 15 MG tablet     Take 1 tablet (15 mg) by mouth daily     vitamin D (cholecalciferol) 25 MCG (1000 UT) tablet     1 tablet (25 mcg)     Vitamin E 670 MG (1000 UT) Cap     1 capsule          Allergies: [name] is allergic to latex.            VISIT INFORMATION        Medical Decision Making & Clinical Course:      I reviewed the vital signs, nursing notes, past medical history, past surgical history, family history and social history.    At 5:53 PM:  67 y.o. female p/w cough, malaise and SOB as described above.  In ER, pt is afebrile, satting well on RA and is hemodynamically stable.  Looks uncomfortable, but is not toxic.  Faint wheezing on exam.  EKG is non-ischemic. Will check CXR, covid swab and flu swab.  Steroids and an albuterol/atrovent neb ordered for a presumed COPD exacerbation.  Re-eval.     At 6:51 PM:  CXR clear  Influenza swab positive  Pt feels much better at this time  Clear lungs  Satting fine  on RA  Will d/c as a COPD exacerbation triggered by influenza   Rx's for prednisone, tamiflu and PRN albuterol provided  ER return instructions given  Pt a/w plan.                   Medical Decision Making  Problems Addressed:  COPD exacerbation: acute illness or injury that poses a threat to life or bodily functions  Influenza: acute illness or injury that poses a threat to life or bodily functions    Amount and/or Complexity of Data Reviewed  Labs: ordered. Decision-making details documented in ED Course.  Radiology: ordered and independent interpretation performed.  ECG/medicine tests: ordered and independent  interpretation performed. Decision-making details documented in ED Course.    Risk  Prescription drug management.          Medications Given in the ED:    .     ED Medication Orders (From admission, onward)      Start Ordered     Status Ordering Provider    12/16/22 1849 12/16/22 1848  oseltamivir (TAMIFLU) capsule 75 mg  Once        Route: Oral  Ordered Dose: 75 mg       Last MAR action: Given Levora Werden    12/16/22 1849 12/16/22 1848  ibuprofen (ADVIL) tablet 600 mg  Once        Route: Oral  Ordered Dose: 600 mg       Last MAR action: Not Given Chattie Greeson    12/16/22 1801 12/16/22 1800  albuterol (PROVENTIL) (2.5 MG/3ML) 0.083% nebulizer solution 5 mg  RT - Once        Route: Nebulization  Ordered Dose: 5 mg       Last MAR action: Given Johanny Segers    12/16/22 1801 12/16/22 1800  ipratropium (ATROVENT) 0.02 % nebulizer solution 0.5 mg  RT - Once        Route: Nebulization  Ordered Dose: 0.5 mg       Last MAR action: Given Kameren Baade    12/16/22 1801 12/16/22 1800  predniSONE (DELTASONE) tablet 40 mg  Once        Route: Oral  Ordered Dose: 40 mg       Last MAR action: Given Lannie Yusuf              Procedures:      Procedures      Interpretations:      At 5:53 PM:  EKG independently reviewed and interpreted by me   Overall Impression: sinus, tachycardic, no ST changes, nl intervals, nl axis      OXYGEN SATURATION INTERPRETATION:  Oxygen saturation independently reviewed and interpreted by me at 5:53 PM   97% on room air, no intervention needed                RESULTS        Lab Results:      Results       Procedure Component Value Units Date/Time    COVID-19 (SARS-CoV-2) and Influenza A/B, NAA ([facility] Rapid) [id number]  (Abnormal) Collected: 12/16/22 1755    Specimen: Culturette from Nasopharyngeal Updated: 12/16/22 1838     Purpose of COVID testing Diagnostic -PUI     SARS-CoV-2 Specimen Source Nasal Swab     SARS CoV 2 Overall Result Not Detected     Influenza A Detected     Influenza B Not Detected     Narrative:  o Collect and clearly label specimen type:  o PREFERRED-Upper respiratory specimen: One Nasal Swab in  Transport Media.  o Hand deliver to laboratory ASAP  Diagnostic -PUI    GROUP A STREP, RAPID ANTIGEN [id number] Collected: 12/16/22 1755    Specimen: Throat Updated: 12/16/22 1820     Group A Strep, Rapid Antigen Negative                Radiology Results:      XR Chest 2 Views   Final Result         1. No acute cardiopulmonary processes.      Laurence Slate, MD   12/16/2022 6:07 PM                  Scribe Attestation:      No scribe was involved in the care of this patient.            Vanna Scotland, MD  12/16/22 [phone]

## 2022-12-16 NOTE — Discharge Instructions (Signed)
Dear Leonides Grills:    Before leaving the emergency department, please check with registration to make sure we have an up-to-date telephone number for you.  Certain laboratory test results do not result during your visit.  We will contact you by telephone if any of these laboratory tests are abnormal.      If you do not continue to improve or if your condition worsens, contact your primary doctor or return to the emergency department for a re-evaluation.      Take these papers to all of your follow-up appointments.  Enclosed you will find all of your test results from today's visit.     Thank you for choosing the [facility] Emergency Department for your healthcare needs.     Vanna Scotland, MD, MPH  Attending Physician  Department of Emergency Medicine

## 2022-12-17 LAB — ECG 12-LEAD
Atrial Rate: 117 {beats}/min
P Axis: 68 degrees
P-R Interval: 148 ms
Q-T Interval: 330 ms
QRS Duration: 78 ms
QTC Calculation (Bezet): 460 ms
R Axis: 69 degrees
T Axis: 51 degrees
Ventricular Rate: 117 {beats}/min

## 2022-12-22 ENCOUNTER — Ambulatory Visit (INDEPENDENT_AMBULATORY_CARE_PROVIDER_SITE_OTHER): Payer: No Typology Code available for payment source | Admitting: Family Medicine

## 2022-12-22 ENCOUNTER — Encounter (INDEPENDENT_AMBULATORY_CARE_PROVIDER_SITE_OTHER): Payer: Self-pay | Admitting: Family Medicine

## 2022-12-22 VITALS — BP 120/80 | HR 108 | Temp 98.0°F

## 2022-12-22 DIAGNOSIS — M79659 Pain in unspecified thigh: Secondary | ICD-10-CM

## 2022-12-22 DIAGNOSIS — R051 Acute cough: Secondary | ICD-10-CM

## 2022-12-22 MED ORDER — PROMETHAZINE-DM 6.25-15 MG/5ML PO SYRP
5.0000 mL | ORAL_SOLUTION | Freq: Three times a day (TID) | ORAL | 0 refills | Status: AC
Start: 2022-12-22 — End: ?

## 2022-12-22 NOTE — Progress Notes (Signed)
Subjective:     Patient ID: Latreece Mochizuki is a 67 y.o. female.    Chief Complaint:  Chief Complaint   Patient presents with    Influenza     Tested positive last week    Cough     HPI  Taylie Helder is here today  -c/o cough for one week  -diagnose with flu in urgent care one week ago.  -finished with tamiflu  -still has cough  -c/o left outer thigh numbness and stabbing pain        PMH  HTN  Cholesterol  Chronic hep C.s/p treatment  Copd  Left knee replacement  Osteopenia    Specialist  Pulmon  cardiologist          03/26/2020    12:00 AM   PHQ2/PHQ9 SCORE    PHQ2 Score 0   PHQ9 Score 0       Review Of Systems:  Review of Systems   Constitutional: Negative.    Respiratory:  Positive for cough.    Neurological:  Positive for tingling.       Objective:   Vital signs  BP 120/80 (BP Site: Right arm, Patient Position: Sitting)   Pulse (!) 108   Temp 98 F (36.7 C) (Temporal)      Physical Exam  Physical Exam  Constitutional:       Appearance: Normal appearance.   HENT:      Nose:      Comments: No sinus tenderness  Cardiovascular:      Rate and Rhythm: Regular rhythm.      Heart sounds: No murmur heard.  Pulmonary:      Breath sounds: Normal breath sounds. No wheezing, rhonchi or rales.   Neurological:      Mental Status: She is alert.            Visit Dx/Orders:   1. Acute cough  - promethazine-dextromethorphan (PROMETHAZINE-DM) 6.25-15 MG/5ML syrup; Take 5 mLs by mouth every 8 (eight) hours  Dispense: 118 mL; Refill: 0    2. Pain of thigh, unspecified laterality      Assessment/Plan:     1. Acute cough   Secondary to flu  Finished with tamiflu and prednisone  Educated patient cough will take 2 to 3 weeks to get better  Sent in cough syrup   2. Pain of thigh, unspecified laterality   Meralgia paresthetica  Secondary to obesity.  Advise patient to loose weight  Offered gabapentin.does not want med.       Follow-up:   No follow-ups on file.      *Portions of this note may be dictated using Dragon naturally  speaking voice recognition software. Variances in spelling and vocabulary are possible and unintentional. Not all areas may be caught/corrected. Please notify me of any discrepancies are noted or if the meaning of any statement is not correct/clear.*    Roselie Skinner, MD

## 2022-12-22 NOTE — Progress Notes (Signed)
Tested positive for Flu 6 days ago & is still experiencing symptoms.   - took Prednisone & tamiflu but still doesn't feel better

## 2022-12-26 ENCOUNTER — Encounter (INDEPENDENT_AMBULATORY_CARE_PROVIDER_SITE_OTHER): Payer: Self-pay

## 2023-01-24 ENCOUNTER — Encounter (INDEPENDENT_AMBULATORY_CARE_PROVIDER_SITE_OTHER): Payer: Self-pay

## 2023-01-25 ENCOUNTER — Encounter (INDEPENDENT_AMBULATORY_CARE_PROVIDER_SITE_OTHER): Payer: Self-pay

## 2023-02-24 ENCOUNTER — Encounter (INDEPENDENT_AMBULATORY_CARE_PROVIDER_SITE_OTHER): Payer: Self-pay

## 2023-02-25 ENCOUNTER — Encounter (INDEPENDENT_AMBULATORY_CARE_PROVIDER_SITE_OTHER): Payer: Self-pay

## 2023-03-26 ENCOUNTER — Encounter (INDEPENDENT_AMBULATORY_CARE_PROVIDER_SITE_OTHER): Payer: Self-pay

## 2023-03-27 ENCOUNTER — Encounter (INDEPENDENT_AMBULATORY_CARE_PROVIDER_SITE_OTHER): Payer: Self-pay

## 2023-04-26 ENCOUNTER — Encounter (INDEPENDENT_AMBULATORY_CARE_PROVIDER_SITE_OTHER): Payer: Self-pay

## 2023-04-27 ENCOUNTER — Encounter (INDEPENDENT_AMBULATORY_CARE_PROVIDER_SITE_OTHER): Payer: Self-pay

## 2023-05-01 NOTE — Progress Notes (Unsigned)
Subjective:     Patient ID: [name] is a 67 y.o. female.    Chief Complaint:  No chief complaint on file.    HPI  [name] is here today             PMH  HTN  Cholesterol  Chronic hep C.s/p treatment  Copd  Left knee replacement  Osteopenia    Specialist  Pulmon  cardiologist          03/26/2020    12:00 AM   PHQ2/PHQ9 SCORE    PHQ2 Score 0   PHQ9 Score 0       Review Of Systems:  ROS    Objective:   Vital signs  There were no vitals taken for this visit.     Physical Exam  Physical Exam       Visit Dx/Orders:   There are no diagnoses linked to this encounter.    Assessment/Plan:   No diagnosis found.    Follow-up:   No follow-ups on file.      *Portions of this note may be dictated using Dragon naturally speaking voice recognition software. Variances in spelling and vocabulary are possible and unintentional. Not all areas may be caught/corrected. Please notify me of any discrepancies are noted or if the meaning of any statement is not correct/clear.*    Roselie Skinner, MD

## 2023-05-02 ENCOUNTER — Ambulatory Visit (FREE_STANDING_LABORATORY_FACILITY): Payer: No Typology Code available for payment source | Admitting: Family Medicine

## 2023-05-02 ENCOUNTER — Encounter (INDEPENDENT_AMBULATORY_CARE_PROVIDER_SITE_OTHER): Payer: Self-pay | Admitting: Family Medicine

## 2023-05-02 VITALS — BP 137/94 | HR 92 | Temp 98.3°F | Wt 202.4 lb

## 2023-05-02 DIAGNOSIS — R7989 Other specified abnormal findings of blood chemistry: Secondary | ICD-10-CM | POA: Insufficient documentation

## 2023-05-02 DIAGNOSIS — Z0000 Encounter for general adult medical examination without abnormal findings: Secondary | ICD-10-CM

## 2023-05-02 DIAGNOSIS — Z1231 Encounter for screening mammogram for malignant neoplasm of breast: Secondary | ICD-10-CM

## 2023-05-02 DIAGNOSIS — K769 Liver disease, unspecified: Secondary | ICD-10-CM | POA: Insufficient documentation

## 2023-05-02 DIAGNOSIS — K5790 Diverticulosis of intestine, part unspecified, without perforation or abscess without bleeding: Secondary | ICD-10-CM | POA: Insufficient documentation

## 2023-05-02 DIAGNOSIS — Z13820 Encounter for screening for osteoporosis: Secondary | ICD-10-CM

## 2023-05-02 DIAGNOSIS — E7800 Pure hypercholesterolemia, unspecified: Secondary | ICD-10-CM

## 2023-05-02 DIAGNOSIS — B182 Chronic viral hepatitis C: Secondary | ICD-10-CM

## 2023-05-02 DIAGNOSIS — K5900 Constipation, unspecified: Secondary | ICD-10-CM | POA: Insufficient documentation

## 2023-05-02 DIAGNOSIS — I10 Essential (primary) hypertension: Secondary | ICD-10-CM

## 2023-05-02 DIAGNOSIS — E669 Obesity, unspecified: Secondary | ICD-10-CM

## 2023-05-02 DIAGNOSIS — Z6839 Body mass index (BMI) 39.0-39.9, adult: Secondary | ICD-10-CM

## 2023-05-02 LAB — COMPREHENSIVE METABOLIC PANEL
ALT: 15 U/L (ref 0–55)
AST (SGOT): 15 U/L (ref 5–41)
Albumin/Globulin Ratio: 1.2 (ref 0.9–2.2)
Albumin: 4.1 g/dL (ref 3.5–5.0)
Alkaline Phosphatase: 77 U/L (ref 37–117)
Anion Gap: 9 (ref 5.0–15.0)
BUN: 10 mg/dL (ref 7–21)
Bilirubin, Total: 0.5 mg/dL (ref 0.2–1.2)
CO2: 26 mEq/L (ref 17–29)
Calcium: 10.1 mg/dL (ref 8.5–10.5)
Chloride: 103 mEq/L (ref 99–111)
Creatinine: 0.8 mg/dL (ref 0.4–1.0)
GFR: >60 mL/min/{1.73_m2} (ref >=60.0)
Globulin: 3.4 g/dL (ref 2.0–3.6)
Glucose: 98 mg/dL (ref 70–100)
Hemolysis Index: 12 Index
Potassium: 4.1 mEq/L (ref 3.5–5.3)
Protein, Total: 7.5 g/dL (ref 6.0–8.3)
Sodium: 138 mEq/L (ref 135–145)

## 2023-05-02 LAB — LIPID PANEL
Cholesterol / HDL Ratio: 4.3 Index
Cholesterol: 199 mg/dL (ref <=199)
HDL: 46 mg/dL (ref >=40)
LDL Calculated: 139 mg/dL — ABNORMAL HIGH (ref 0–129)
Triglycerides: 70 mg/dL (ref 34–149)
VLDL Calculated: 14 mg/dL (ref 10–40)

## 2023-05-02 LAB — CBC
Absolute nRBC: 0 10*3/uL (ref <=0.00)
Hematocrit: 45.1 % — ABNORMAL HIGH (ref 34.7–43.7)
Hemoglobin: 14.4 g/dL (ref 11.4–14.8)
MCH: 29.7 pg (ref 25.1–33.5)
MCHC: 31.9 g/dL (ref 31.5–35.8)
MCV: 93 fL (ref 78.0–96.0)
MPV: 11.6 fL (ref 8.9–12.5)
Platelet Count: 265 10*3/uL (ref 142–346)
RBC: 4.85 10*6/uL (ref 3.90–5.10)
RDW: 13 % (ref 11–15)
WBC: 7.29 10*3/uL (ref 3.10–9.50)
nRBC %: 0 /100 WBC (ref <=0.0)

## 2023-05-02 LAB — HEMOGLOBIN A1C
Average Estimated Glucose: 119.8 mg/dL
Hemoglobin A1C: 5.8 % — ABNORMAL HIGH (ref 4.6–5.6)

## 2023-05-02 LAB — THYROID STIMULATING HORMONE (TSH) WITH REFLEX TO FREE T4: TSH: 0.87 u[IU]/mL (ref 0.35–4.94)

## 2023-05-02 MED ORDER — ATORVASTATIN CALCIUM 20 MG PO TABS
20.0000 mg | ORAL_TABLET | Freq: Every day | ORAL | 3 refills | Status: DC
Start: 2023-05-02 — End: 2024-06-16

## 2023-05-02 MED ORDER — LOSARTAN POTASSIUM-HCTZ 100-12.5 MG PO TABS
1.0000 | ORAL_TABLET | Freq: Every day | ORAL | 3 refills | Status: DC
Start: 2023-05-02 — End: 2024-06-12

## 2023-05-03 LAB — HEPATITIS C RNA, QUANTITATIVE REAL-TIME PCR
HCV RNA PCR Quantitative IU/mL: <15 IU/mL
HCV RNA PCR Quantitative Log IU/mL: <1.18 log IU/mL

## 2023-05-04 ENCOUNTER — Other Ambulatory Visit: Payer: Self-pay | Admitting: Family Medicine

## 2023-05-08 ENCOUNTER — Telehealth (INDEPENDENT_AMBULATORY_CARE_PROVIDER_SITE_OTHER): Payer: Self-pay | Admitting: Family Medicine

## 2023-05-08 NOTE — Telephone Encounter (Signed)
Pt calling to update pc w/ bp readings from recent apt. Asking for cb from nurse. Please advise

## 2023-05-09 ENCOUNTER — Other Ambulatory Visit (INDEPENDENT_AMBULATORY_CARE_PROVIDER_SITE_OTHER): Payer: Self-pay | Admitting: Family Medicine

## 2023-05-09 DIAGNOSIS — R928 Other abnormal and inconclusive findings on diagnostic imaging of breast: Secondary | ICD-10-CM

## 2023-05-14 ENCOUNTER — Other Ambulatory Visit (INDEPENDENT_AMBULATORY_CARE_PROVIDER_SITE_OTHER): Payer: Self-pay | Admitting: Family Medicine

## 2023-05-14 DIAGNOSIS — N6320 Unspecified lump in the left breast, unspecified quadrant: Secondary | ICD-10-CM

## 2023-05-16 ENCOUNTER — Encounter (INDEPENDENT_AMBULATORY_CARE_PROVIDER_SITE_OTHER): Payer: Self-pay

## 2023-05-27 ENCOUNTER — Encounter (INDEPENDENT_AMBULATORY_CARE_PROVIDER_SITE_OTHER): Payer: Self-pay

## 2023-05-28 ENCOUNTER — Encounter (INDEPENDENT_AMBULATORY_CARE_PROVIDER_SITE_OTHER): Payer: Self-pay

## 2023-06-06 ENCOUNTER — Other Ambulatory Visit (INDEPENDENT_AMBULATORY_CARE_PROVIDER_SITE_OTHER): Payer: Self-pay | Admitting: Family Medicine

## 2023-06-06 DIAGNOSIS — C50412 Malignant neoplasm of upper-outer quadrant of left female breast: Secondary | ICD-10-CM

## 2023-06-13 ENCOUNTER — Other Ambulatory Visit (INDEPENDENT_AMBULATORY_CARE_PROVIDER_SITE_OTHER): Payer: Self-pay | Admitting: Body Imaging

## 2023-06-13 ENCOUNTER — Telehealth (INDEPENDENT_AMBULATORY_CARE_PROVIDER_SITE_OTHER): Payer: Self-pay | Admitting: Family Medicine

## 2023-06-13 NOTE — Telephone Encounter (Signed)
Spoke to patient about the breast biopsy result.referral placed

## 2023-06-13 NOTE — Addendum Note (Signed)
Addended byRoselie Skinner on: 06/13/2023 07:04 PM     Modules accepted: Orders

## 2023-06-13 NOTE — Telephone Encounter (Signed)
Pathology report received. Will scan under Media. Secure chat sent to PCP about referral/informing Pt of pathology results.

## 2023-06-13 NOTE — Progress Notes (Signed)
Manually entered and scanned Breast Pathology Report into Pathology Report order.

## 2023-06-13 NOTE — Telephone Encounter (Signed)
[facility] called to see if we received pt's breast biopsy Pathology results. I informed them that we have not. They are going to fax to [place] and then call and confirm to make sure we receive. Will be on lookout for the pathology fax.

## 2023-06-13 NOTE — Progress Notes (Signed)
Refer to telephone encounter 06/13/23

## 2023-06-14 NOTE — Progress Notes (Addendum)
Subjective:     Patient ID: [name] is a 67 y.o. female.    Chief Complaint:  Chief Complaint   Patient presents with    Cough    Fatigue     HPI  [name] is here today   -c/o sinus pain   -cough  -nasal congestion  -no  fever.  -here for left breast biopsy result.        -had colonoscopy in 2020  -pap done in 2019      PMH  HTN  Cholesterol  Chronic hep C.s/p treatment  Copd  Left knee replacement  Osteopenia    Specialist  Pulmon  cardiologist          05/02/2023    10:22 AM   PHQ2/PHQ9 SCORE    PHQ2 Score 1       Review Of Systems:  Review of Systems   Constitutional: Negative.    HENT:  Positive for congestion.    Respiratory:  Positive for cough.        Objective:   Vital signs  BP 129/86   Pulse (!) 109   Temp 98.7 F (37.1 C) (Oral)      Physical Exam  Physical Exam  Constitutional:       Appearance: Normal appearance.   HENT:      Nose: Congestion present.      Mouth/Throat:      Comments: Maxillary sinus tenderness  Cardiovascular:      Heart sounds: Normal heart sounds.   Pulmonary:      Breath sounds: Normal breath sounds. No wheezing, rhonchi or rales.   Neurological:      Mental Status: She is alert.            Visit Dx/Orders:   1. Acute maxillary sinusitis, recurrence not specified  - amoxicillin-clavulanate (AUGMENTIN) 875-125 MG per tablet; Take 1 tablet by mouth 2 (two) times daily for 10 days  Dispense: 20 tablet; Refill: 0    2. Acute cough  - promethazine-dextromethorphan (PROMETHAZINE-DM) 6.25-15 MG/5ML syrup; Take 5 mLs by mouth every 8 (eight) hours  Dispense: 118 mL; Refill: 0    3. Malignant neoplasm of upper-outer quadrant of left female breast, unspecified estrogen receptor status      Assessment/Plan:     1. Acute maxillary sinusitis, recurrence not specified   Started on augmentin  Wants to wait on testing for covid.   2. Acute cough   Cough syrup sent   3. Malignant neoplasm of lower-inner quadrant of left female breast, unspecified estrogen receptor status    Newly diagnosed left breast cancer  Referred to breast surgeon/oncologist       Follow-up:   No follow-ups on file.      *Portions of this note may be dictated using Dragon naturally speaking voice recognition software. Variances in spelling and vocabulary are possible and unintentional. Not all areas may be caught/corrected. Please notify me of any discrepancies are noted or if the meaning of any statement is not correct/clear.*    Roselie Skinner, MD

## 2023-06-15 ENCOUNTER — Other Ambulatory Visit: Payer: Self-pay | Admitting: Surgery

## 2023-06-15 ENCOUNTER — Ambulatory Visit (INDEPENDENT_AMBULATORY_CARE_PROVIDER_SITE_OTHER): Payer: No Typology Code available for payment source | Admitting: Family Medicine

## 2023-06-15 ENCOUNTER — Telehealth: Payer: Self-pay

## 2023-06-15 ENCOUNTER — Encounter (INDEPENDENT_AMBULATORY_CARE_PROVIDER_SITE_OTHER): Payer: Self-pay | Admitting: Family Medicine

## 2023-06-15 VITALS — BP 129/86 | HR 109 | Temp 98.7°F

## 2023-06-15 DIAGNOSIS — R051 Acute cough: Secondary | ICD-10-CM

## 2023-06-15 DIAGNOSIS — J0100 Acute maxillary sinusitis, unspecified: Secondary | ICD-10-CM

## 2023-06-15 DIAGNOSIS — C50912 Malignant neoplasm of unspecified site of left female breast: Secondary | ICD-10-CM

## 2023-06-15 DIAGNOSIS — C50412 Malignant neoplasm of upper-outer quadrant of left female breast: Secondary | ICD-10-CM

## 2023-06-15 MED ORDER — PROMETHAZINE-DM 6.25-15 MG/5ML PO SYRP
5.0000 mL | ORAL_SOLUTION | Freq: Three times a day (TID) | ORAL | 0 refills | Status: DC
Start: 2023-06-15 — End: 2023-07-26

## 2023-06-15 MED ORDER — AMOXICILLIN-POT CLAVULANATE 875-125 MG PO TABS
1.0000 | ORAL_TABLET | Freq: Two times a day (BID) | ORAL | 0 refills | Status: AC
Start: 2023-06-15 — End: 2023-06-25

## 2023-06-15 NOTE — Telephone Encounter (Signed)
Follow up regarding referral     Called and spoke with the patient. Patient stated that she was on her way to speak with her PCP about the referral. Patient to call back once she has spoken with her PCP, provided direct contact information.

## 2023-06-15 NOTE — Telephone Encounter (Signed)
[facility] New Patient Coordinator Note    Physician/Location Preference:    Location Preference: [facility]    Physician Preference: First available     Referral:    Referring Provider: Dr. Roselie Skinner    Is Referral required per insurance? No      History:    Personal Hx of Cancer: No    Prior Chemotherapy - No  Prior Radiation - No   Prior Surgery related to Cancer - No    Family Hx of Cancer : Yes - Sister, aunt, cousins - breast cancer    Biopsy History:    Yes - [facility]     Imaging History:    Prior Imaging: Yes     Type of Imaging: Mammogram  Location Performed:[facility]    Other:     Are there patient owned records that will be brought to the first appointment?No    Has the Appointment been scheduled? 10/3 with Dr. Lindi Adie

## 2023-06-18 ENCOUNTER — Telehealth: Payer: Self-pay | Admitting: Student in an Organized Health Care Education/Training Program

## 2023-06-18 NOTE — Telephone Encounter (Signed)
Called patient to introduce myself and to go over what will happen at her initial appointment.  Explained my role in that I will coordinate future appointments and facilitate surgical education   We reviewed imaging and pathology results.   Briefly discussed how breast cancer is treated and that Dr. Lindi Adie will provide a specific treatment plan at her consultation.   We discussed role of medical oncology, radiation oncology and genetic counselor.  Reviewed tumor markers and how they direct what medication. Discussed that this is an estrogen cancer and she will benefit from hormonal medicine post surgery   We discussed oncotype testing and its role to determine if chemotherapy needed .     We discussed that surgeon will review lumpectomy and mastectomy at visit specifically with regards to technique and recurrence rates.   Briefly reviewed sentinel lymph node biopsy   Discussed the role of radiation therapy with invasive cancers and lumpectomy   Discussed that I will schedule MRI and we reviewed MRI prep instructions. We discussed need for bilateral breast MRI to be sure no disease hiding     Patient knows All questions were answered.  Patient was provided phone number to contact me with questions. Patient has active mychart.         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 Mammo screening:   Left breast: Question distortion in the lateral periareolar breast best  seen on craniocaudal image. Focal asymmetry suggested in the upper outer  quadrant.  2. Mammo diag:   There is a persistent area of distortion in the lateral periareolar   breast.  No definite orthogonal correlate seen however this projects superiorly.  There is a persistent partially obscured mass in the upper outer quadrant  with a few associated punctate calcifications.  3. [place]:    At 2:30 7 cm from the nipple there is an oval circumscribed mildly  hypoechoic  mass with internal echogenicities. This is felt to correspond   to  the mammographic asymmetry with calcifications. This measures 0.6 x 0.3 x  0.5 cm.      At 3:00 in the periareolar breast there is an incidentally detected  benign-appearing cyst measuring 0.2 x 0.3 x 0.3 cm.     At 2:00 4 cm from the nipple there is an irregular hypoechoic mass  measuring 0.6 x 0.5 x 0.5 cm felt to be a possible correlate to the  questioned mammographic distortion.     Ultrasound of the left axilla demonstrated benign-appearing lymph nodes  with preserved morphology.  4.  Biopsy     Craniocaudal and mediolateral views of the  left breast following biopsy demonstrate adequate placement of each biopsy  clip.   Hx Family CA hx: sister- breast cancer; 1st cousins (maternal & paternal)  Metal Implants:  knee replacement   Appointments Bilateral breast MRI- 07/04/23 at 6:45 am  arrival time- [facility], [place], [address] [place] [address]  [place], 3rd floor [phone]    Dr. Lindi Adie-  06/21/2023 at 10:00 am  arrival time, [facility], [address] 6th floor, [facility] [phone]      Radiation Oncology-   messaged     Genetic Counselor- messaged

## 2023-06-18 NOTE — Telephone Encounter (Signed)
Called and scheduled patient for Radiation consult with Dr. Orma Render on 07/03/23 at 3pm.  Informed to arrive 20 minutes prior to check in, gave parking information and sent confirmation email.

## 2023-06-18 NOTE — Progress Notes (Signed)
Pre-Test Genetic Counseling Note    Patient: Kadiedra Jarzabek  DOB: [birth year]  Age: 67 y.o.    SUMMARY  Zyeria Hayre is a 68 y.o. female referred for genetic counseling regarding her personal and family history of breast cancer. After discussion of the risks, benefits, and limitations of genetic testing, Clessie Plants elected to pursue the 21-gene BRCANext-Expanded with RNAinsight genetic testing panel from [employer] [employer]. I will call her in 2-3 weeks with her results.     Reason for Referral  Taeler Lurine Mcnealy is a 67 y.o. female of African American ancestry who is referred by Dr. Lindi Adie for evaluation of a personal history of breast cancer, for a discussion regarding genetic testing and for recommendations for ongoing cancer surveillance and prevention.  The patient was unaccompanied to her session and counseling was undertaken for 30-35 minutes.    Medical History   Zahniyah Rightmyer was diagnosed with breast cancer recently at the age of 61. 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 The patient has met with Dr. Lindi Adie and is planning on this time to undergo a probable lumpectomy. She has an MRI on Monday and surgery will likely be in November. Other medical history:  - she just recently stopped smoking and reported that a pack would last her about 2-3 days. She reports no alcohol use.      Guinevere Scarlet Pickron's relevant gynecologic history is as follows: the patient is G7P6. She reports no use of HRT following menopause.     Takiya Michaux reports performing the following cancer screening:   Breast: see above  Ovaries: uterus and ovaries intact  Colon: colonoscopy about 5 years ago. She reports the removal of polyps- not clear if all benign or some possible precancerous.   Skin: no issues reported    Family History  Pattianne Meline Oshel's family history is significant for the  following:          Maternal ancestry: African American  Paternal ancestry:  African American     Family History   Cancer History Relation Name Age of Onset    Breast cancer Sister          Diagnoses in her late 86's, died @62  - metastatic disease    Heart disease Maternal Grandmother      Prostate cancer Paternal Grandfather      Breast cancer Other          Maternal great-aunt dx and died in her 105's    Breast cancer Other          Maternal first cousin once removed - daughter of great-aunt - dx and died in her 63's    Breast cancer Paternal Cousin  29    Breast cancer Paternal Cousin  92       Assessment   Kambrie Stoltenberg Veselka's personal and family history warrants consideration of an inherited susceptibility to cancer.  Suggestive features include:   (A) her diagnosis of breast cancer; but postmenopausal in addition to her family history as noted:  (B) her sister with breast cancer in her 59's  (C) more distant relatives with breast cancer on the maternal side and the paternal side as noted above with breast cancer.    We did review that given the patient's late onset disease, that it is possible that her other affected relatives with earlier onset disease may have carried/carry a mutation  that she does not. This applies in particular to relatives with earlier onset as reported breast cancer.  Marland Kitchen     Approximately 5-10% of breast cancer cases are attributable to hereditary risk. Features suggestive of inherited risk include young age at diagnosis (typically <50y), triple negative pathology, or if an individual is affected with breast cancer and has additional close relative(s) diagnosed with breast cancer and/or other related cancers (including ovarian, pancreatic, or aggressive prostate). Guinevere Scarlet Sylvan's personal history of breast cancer and her family history causes [place] to consider the possibility for her to carry a mutation in a breast cancer susceptibility gene.     We discussed that there are different  types of breast cancer susceptibility genes, including high risk (i.e. BRCA1 and BRCA2, PALB2), moderate risk (ATM, CHEK2) and newly described. Individuals that have a mutation in a gene in the latter two categories are more difficult to predict at the present time, given that mutations in these genes present with a variable clinical picture in affected families.   HEREDITARY CANCER SYNDROMES WITH INCREASED RISK OF BREAST CANCER   Syndrome Gene(s) Lifetime Risk of Breast Cancer Associated Cancer Types / Features   Hereditary Breast and Ovarian Cancer (HBOC) syndrome BRCA1, BRCA2 40-60%  (BRCA1 mutations are often associated with triple negative pathology) Ovarian, pancreatic, female breast, melanoma, prostate   Hereditary Diffuse Gastric Cancer (HDGC) syndrome CDH1 40-60%  (Often lobular pathology) Diffuse gastric cancer    PALB2 40-60%  (Associated with triple negative pathology) Pancreatic, ovarian cancer    PTEN-Hamartoma Tumor Syndrome PTEN 40-60% Uterine, thyroid, kidney, colorectal cancer, other features    Burgess Amor Syndrome TP53   Significantly increased (over 60%)    Often diagnosed in 20s-30s, can be associated with triple positive pathology Sarcoma, brain, adrenocortical, leukemia, others     ATM 20-30% Pancreatic, prostate cancer     CHEK2 20-40%  (Breast cancer risk is mutation-specific) Prostate cancer    Peutz-Jeghers syndrome STK11 ~40-60% Breast, colon, small bowel, pancreatic     BARD1, NF1 Increased   (Lower penetrance)  --     BRIP1, RAD51C, RAD51D Increased  (Associated with triple negative pathology) Ovarian cancer    She appears to be an appropriate candidate for genetic testing for hereditary cancer risk, since she appears to meet current NCCN testing criteria for high penetrance breast cancer susceptibility genes, given her personal and family history. A multi-gene panel is recommended to assess the potential for Sharol Thornley to carry a mutation in one of several known cancer  susceptibility genes. The identification of a mutation would guide the most appropriate cancer screening and risk reduction recommendations for Amanni Osthoff and her family members.     Content of Discussion  We discussed the difference between sporadic versus hereditary cancers and the relative frequencies of these.      We reviewed the availability of multi-gene panels and the varying levels of cancer risk associated with different genes. Broadly, we consider some genes to be "high risk" while others cause a more "moderate" level of risk.  Lastly, there are some "newly described" genes for which the level of cancer risk is not yet well understood.    We reviewed possible genetic test results, including positive, negative and inconclusive. Much of our discussion focused on the medical implications of a positive genetic test result, including risk(s) for possibly more than one type of cancer and the options for management of increased cancer risks including elevated surveillance, risk-reducing surgery and chemoprevention.  I explained that effective screening and/or risk-reducing options may not exist for all of the increased cancer risks associated with the hereditary cancer susceptibility genes.     Lissete Rooker understood the limitations in some of the currently available data, that recommendations for cancer screening and/or risk reduction are tailored to both the genetic test result and the personal/family history, and that her age and other factors need to be taken into account when trying to determine the degree of benefit that she may derive from risk-reducing surgery.     We discussed how the identification of a mutation in a high or moderate penetrance breast cancer susceptibility gene may influence her surgery decision making (i.e. consideration of bilateral mastectomies with a mutation in a high penetrance breast cancer susceptibility gene, as per NCCN guidelines). We reviewed that there are  currently no clear recommendations regarding bilateral mastectomies in the context of certain gene mutations, such as in ATM or CHEK2, due to insufficient data regarding the risk to develop a second primary breast cancer. We also discussed potential recommendations for elevated breast cancer screening with breast MRI in gene mutation carriers who choose to pursue a more conservative breast surgery (i.e. partial mastectomy). The identification of a mutation may also influence other recommendations for cancer screening/risk reduction for Shanquilla Cadman and her relatives.     While the above was reviewed, given her age we specifically address the lack of data about contralateral breast cancer risk even for genes like BRCA1/2 in the older population. She states that it is unlikely that this information would change her current approach to breast surgery - but may useful in terms of cancer screening in the future and provide information for her relatives.      We discussed the fact that "negative" test results are of limited value unless a mutation has already been identified in the family. There is a significant population risk for cancer, and there may be unidentified genes responsible for the observed personal and family history.  Increased cancer surveillance (or prevention interventions) may be appropriate even after negative results, based upon empiric estimates of risk. There is a chance that a mutation in a particular cancer susceptibility gene may not be detected, even if one is present, due to either technical limitations or laboratory error.     I also explained that the test result may identify a variant of uncertain significance.  These "variants" may or may not be associated with an increased cancer risk, and the optimal management of families transmitting such variants has not been defined.  The majority of variants of uncertain significance are ultimately reclassified as benign (not associated with  an increased cancer risk).  Screening and prevention recommendations are therefore based on someone's personal and family histories.    BRCANext-Expanded: 21 genes with RNAinsight.  With a large panel there is an approximately 25% likelihood of finding a variant of unknown significance in one or more of the genes included.      We discussed the fact that genetic testing would be recommended for her immediate relatives (including her siblings and her adult children) if her test result is positive.  In this case, each of them would have a 50% likelihood of sharing the mutation. In most cases, genetic testing for cancer susceptibility is not recommended for individuals under age 84 years unless the result will alter their medical management before this age. We could also offer testing to other relatives. Family members who choose not to be  tested may be able to infer their mutation status from that of others tested.  If Kenedee Bohac Malesky's test result is negative or inconclusive, I may recommend that her other affected living relatives or close relatives of those who have passed away from breast cancer consider pursuing GC/GT.        I also discussed our policies for documentation of genetic test results in the medical record and the theoretical possibility of employment and insurance discrimination based on genetic test results.    Psychosocial Considerations  Genetic testing may have significant psychological implications for both individuals and families.  During the session, Christy Goicoechea demonstrated understanding of the material presented. She raised appropriate questions, which were answered to the best of my ability.     Plan for Genetic Testing  Sevanna Trisdale is aware that genetic testing may indicate an increased risk for cancers unrelated to her personal and/or family history. Medha Siroky indicated intention to follow medical management guidelines provided on the basis of genetic  testing and/or her personal/family history, as appropriate. She reports intention to share the results of genetic testing and relevant management guidelines with her relatives as recommended.      After this discussion, Christee Wertzberger demonstrated appropriate understanding of the material covered and elected to pursue genetic testing.     [name] signed the appropriate consent document(s) and provided a blood sample for genetic testing, which will be sent to the genetic testing laboratory. We will submit the test requisition form to Guinevere Scarlet Crisanto's referring provider for their review and signature.  Olamide Alpha Bare expressed understanding of and agreement to this plan.    Given the fact that Reveca Howze Solarz's pending surgical decision may be informed by results for one or more genes included in this test, I ordered the BRCAplus panel. Results are expected within ~1 week. Concurrent panel testing will also be ordered, and the results of the larger panel should be available approximately 7-10 days afterward. The testing order will be signed by the referring provider and could impact turn-around-time for results.        The insurance and billing process was reviewed.     Screening Recommendations and Plan for Follow-up  I will discuss screening and prevention recommendations when genetic test results are available to inform that discussion.     Conclusion   It was a pleasure to meet with [name] and I remain available for any additional questions that arise while we wait for genetic test results.   I will contact [name] by phone when her results are available.    As has been discussed with the patient, if she is unavailable and can't be reached on multiple occasions, I will disclose the result directly to the referring physician.    Telvin Reinders A. Sabriel Borromeo, MS, CGC  Certified and Licensed Press photographer, Pensions consultant [facility]  A Department of [facility]  [address]  [address]  [place], [place] [address]  [phone] - phone  [phone] - fax  [email or web address]

## 2023-06-19 ENCOUNTER — Ambulatory Visit
Admission: RE | Admit: 2023-06-19 | Discharge: 2023-06-19 | Disposition: A | Discharge status: Home / Self Care | Payer: No Typology Code available for payment source | Source: Ambulatory Visit | Attending: Surgery | Admitting: Surgery

## 2023-06-19 DIAGNOSIS — C50919 Malignant neoplasm of unspecified site of unspecified female breast: Secondary | ICD-10-CM | POA: Insufficient documentation

## 2023-06-20 ENCOUNTER — Telehealth: Payer: Self-pay

## 2023-06-20 NOTE — Telephone Encounter (Signed)
Called patient and LVM to tell her we will keep breast MRI as scheduled for 10/16 here at the [facility]  Told her surgeon will see her tomorrow and review plan     Surgical consult tomorrow at 1015

## 2023-06-20 NOTE — Progress Notes (Addendum)
Subjective:       Patient ID: [name] is a 67 y.o. female.    HPI  Patient had no breast complaints at the time of her screening mammogram when an area of distortion was noted.  Diagnostic imaging confirmed the need for biopsy.  Biopsy revealed a cancer.  The patient is here today to discuss her surgical options. She denies any breast pain, nipple discharge or other palpable masses.  She has had no problems secondary to the core biopsy and denies any redness or bleeding.     Discussed that she is an excellent candidate for endocrine therapy and will get a significant benefit from this.  We discussed that given that she is HER2/neu negative with a relatively low Ki-67 she may be able to avoid chemotherapy.  We discussed the role of the Oncotype Dx analysis.  We discussed lumpectomy versus mastectomy, the need for clear margins and the role of radiation in each.  We discussed how sentinel lymph node biopsies performed and the associated risk of lymphedema.  Would like to have breast conservation.  She will meet with plastic surgery.  Will meet with genetics.  Her sister had breast cancer at age 58.    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 No additional  suspicious enhancement.  2. No MRI evidence for malignancy in the contralateral right breast.  3. No axillary lymphadenopathy.    The following portions of the patient's history were reviewed and  updated as appropriate: allergies, current medications, past family history, past medical history, past social history, past surgical history, and problem list.    Review of Systems   Constitutional:  Positive for unexpected weight gain.   HEENT:   Positive for congestion.    Respiratory: Negative.     Cardiovascular:  Positive for leg pain.   Gastrointestinal: Negative.    Endocrine: Negative.    Musculoskeletal:  Positive for arthralgias, back pain and flank pain.   Skin: Negative.    Neurological:  Positive for numbness and tingling.   Hematological: Negative.    Psychiatric/Behavioral: Negative.     All other systems reviewed and are negative.          Objective:    Physical Exam  WDWN female in NAD  HEENT:  Clear, no scleral icterus, neck supple  Neck:  No thyromegaly or masses, trachea midline  Chest:  Clear to auscultation, respiratory effort normal  CV:  RRR without murmurs or gallops, no pedal edema  Abd:  No obvious distention  BJM:  Gait and station normal  Ext:  No CCE  Skin:  Free of significant ulcers or lesions in exposed areas  Neuro:  Grossly intact, no focal findings, alert and oriented, asks appropriate questions  LN:  No axillary, supraclavicular or cervical adenopathy  Breast:  She is examined in the upright and supine position. Both nipples are everted. There is no nipple discharge. There are no discrete masses bilaterally.  Can see at 2:30 5 cm  from the nipple this should be the invasive cancer.  At 3:00 7 cm from the nipple this should be the benign pathology  Second clip is slightly inferior and lateral      Assessment:       Left 2:00 Cancer  Left 230 benign      Plan:      Procedures    I discussed with her that breast cancer is currently treated using any or all of the four modalities of surgery, chemotherapy, radiation therapy, and hormonal therapy.  Surgical treatment options include a mastectomy with or without reconstruction, or lumpectomy followed by radiation therapy.  For invasive  cancer, a sentinel lymph node biopsy and possibly an axillary lymph node dissection will also be part of the surgical treatment.  The radiation is a major component of breast-conserving surgery and is needed to approximate the same prognosis as that of a mastectomy.  In addition to the pathology report that we already have, there is testing done on the tumor itself, such as estrogen and progesterone receptors, and Her-2-neu.  These tests give [place] some indication as to the aggressiveness of the tumor.  The decision about chemotherapy is based on the parameters of the tumor type, tumor size, lymph node status, receptor status, and possibly some of the other chemical analyses.    I advised [name] of the procedures used for the sentinel lymph node biopsy.  An injection of a radioisotope is made into the breast with the tumor, either in the nuclear medicine department or in the operating room.  I will also inject a blue dye into the breast with the tumor.  Nodes that contain either dye will be removed.  On average, 2 lymph nodes are identified as sentinel nodes, but there may be more or less. The pathologist will give a preliminary intraoperative report.  I advised her that if the sentinel node is found to contain cancer or I am unable to identify the sentinel lymph node, a complete axillary dissection may be required.   After surgery, the sentinel node(s) will be scrutinized more intensely by the pathologists. Additional sectioning and analysis will lead to the the final pathology report differing from the intraoperative report about 15 percent of the time.  If this occurs, a decision will need to be made as to whether we need to go back to the operating room to remove more lymph nodes.    The current success rate of sentinel node mapping is about 98 percent.  When blue dye is been used, she will notice blue discoloration of her skin, urine and feces for 24 and 48 hours after surgery.  There have been rare reported  cases of permanent blue tattooing of the skin after a sentinel node injection.    We discussed performing a wire localization excision of breast cancer.  The wire is placed in the operating room. There is usually a biopsy clip at this site.The surgery is done under either IV sedation or general anesthesia, the area of the cancer with surrounding normal breast tissue is removed.  While the patient and I remain in the operating room, the specimen is sent to the radiologist for x-ray confirmation that the correct area has been removed.  The tissue is then sent to the pathologist.   It will take several days for the pathologist to process and microscopically evaluate the tissue margins.  If the margins are not involved, then no further surgery to the breast is required.  If  the margins contain cancer, additional surgery will be required to achieve clear margins.  This can be done with a re-excision or a mastectomy.

## 2023-06-21 ENCOUNTER — Encounter: Payer: Self-pay | Admitting: Surgery

## 2023-06-21 ENCOUNTER — Encounter (INDEPENDENT_AMBULATORY_CARE_PROVIDER_SITE_OTHER): Payer: Self-pay | Admitting: MS"

## 2023-06-21 ENCOUNTER — Ambulatory Visit: Payer: No Typology Code available for payment source | Attending: Surgery | Admitting: Surgery

## 2023-06-21 ENCOUNTER — Ambulatory Visit (INDEPENDENT_AMBULATORY_CARE_PROVIDER_SITE_OTHER): Payer: Self-pay | Admitting: MS"

## 2023-06-21 ENCOUNTER — Other Ambulatory Visit (INDEPENDENT_AMBULATORY_CARE_PROVIDER_SITE_OTHER): Payer: Self-pay | Admitting: MS"

## 2023-06-21 ENCOUNTER — Other Ambulatory Visit (FREE_STANDING_LABORATORY_FACILITY): Payer: No Typology Code available for payment source

## 2023-06-21 DIAGNOSIS — Z170 Estrogen receptor positive status [ER+]: Secondary | ICD-10-CM

## 2023-06-21 DIAGNOSIS — C50412 Malignant neoplasm of upper-outer quadrant of left female breast: Secondary | ICD-10-CM

## 2023-06-21 DIAGNOSIS — Z803 Family history of malignant neoplasm of breast: Secondary | ICD-10-CM

## 2023-06-21 DIAGNOSIS — Z8042 Family history of malignant neoplasm of prostate: Secondary | ICD-10-CM

## 2023-06-21 DIAGNOSIS — C50912 Malignant neoplasm of unspecified site of left female breast: Secondary | ICD-10-CM

## 2023-06-21 LAB — GENETICS COUNSELING TEST COLLECTION KIT (FOR ISCI ONLY)

## 2023-06-21 NOTE — Progress Notes (Signed)
New Patient Education: Initial Nurse Visit    [x]   Introduced self to patient and family, including name, title, and credentials.  [x]   Included patient and family members/significant others in education: Patient       [x]   Assessed for barriers to education.   None Identified  [x]   Preferred language:  English  [x]   Preferred method of education (select all that apply):  verbal instuctions, handouts  [x]   Assistance needed for education (select all that apply):  None  [x]   Assessed for advance directive.  will follow up with either physician or navigator  [x]   Education packet given to patient and handouts reviewed. Handouts include:  Patient's imaging reports - provided copies of all imaging and biopsy reports to date   Patient disposition form - reviewed all upcoming appointments with location, date and time of appointments  MD summary from visit (if applicable)  [facility] Discharge Instructions After Breast Surgery   Breast Cancer Treatment Handbook - Highlighted certain areas in the book including: understanding your pathology report; surgical arm exercises; questions for reconstructive surgery, radiation oncology and medical oncology.   [x]   Patient/family educated on [employer] telephone tree and importance of following prompts.  [x]   Patient/family educated on programs and services of [facility] (handout in new patient packet).  SOS - provided copy of SOS flyer  [facility] - provided online program flyer  [facility] 2024 Breast Cancer Support Groups  [facility] Breast Surgery Preparation Class   [x]   Reviewed the education process with patient/family and set up following up appointments (date/time/place):   [x]   Radiation Oncology appointment: Dr Orma Render 10/15 3:00 pm    [x]   Genetic appointment: Tiffani DeMarco 10/03 11:30 am    [x]   Plastic surgery appointment: Dr Mare Ferrari 10/16 8:15 am   [x]   Bilateral Breast MRI 10/07 10:30 am [facility] [place]     [x]   Review CURES act and its  impact on test results: [email or web address]  [x]   Patient/family members questions addressed.  [x]   Evaluated patient/family members understanding of education.  Patient verbalizes understanding.  [x]   Action Plan:    F/U after plastic surgery appointment  Schedule surgery date -- patient wants lumpectomy and wants next available

## 2023-06-25 ENCOUNTER — Other Ambulatory Visit: Payer: Self-pay

## 2023-06-25 ENCOUNTER — Ambulatory Visit
Admission: RE | Admit: 2023-06-25 | Discharge: 2023-06-25 | Disposition: A | Discharge status: Home / Self Care | Payer: No Typology Code available for payment source | Source: Ambulatory Visit | Attending: Surgery | Admitting: Surgery

## 2023-06-25 ENCOUNTER — Other Ambulatory Visit: Payer: Self-pay | Admitting: Surgery

## 2023-06-25 DIAGNOSIS — C50912 Malignant neoplasm of unspecified site of left female breast: Secondary | ICD-10-CM | POA: Insufficient documentation

## 2023-06-25 MED ORDER — GADOBUTROL 1 MMOL/ML IV SOSY (WRAP)
10.0000 mL | Freq: Once | INTRAVENOUS | Status: AC | PRN
Start: 2023-06-25 — End: 2023-06-25
  Administered 2023-06-25: 10 mL via INTRAVENOUS
  Filled 2023-06-25: qty 10

## 2023-06-26 ENCOUNTER — Encounter (INDEPENDENT_AMBULATORY_CARE_PROVIDER_SITE_OTHER): Payer: Self-pay

## 2023-06-27 ENCOUNTER — Telehealth: Payer: Self-pay

## 2023-06-27 NOTE — Progress Notes (Signed)
Error

## 2023-06-27 NOTE — Telephone Encounter (Signed)
Left breast: At the 2:00 position, anterior depth, there is an irregular  enhancing mass measuring 0.9 cm demonstrating persistent type I  enhancement corresponding to biopsy-proven invasive mammary carcinoma.  Biopsy marker is seen adjacent.     No additional suspicious enhancement in the left breast.  Susceptibility artifact from a second biopsy marker is seen in the upper  outer quadrant more posteriorly and inferiorly corresponding to benign  ultrasound-guided biopsy at 2:30 position without suspicious enhancement.     Lymph nodes: No enlarged axillary or internal mammary chain lymph nodes.    Told patient no biopsies needed  Will double check with surgeon on clip location being adjacent     Patient seeing Dr. Mare Ferrari on 10/16, no earlier appt for consult from his office     Will send results to surgeon

## 2023-07-03 ENCOUNTER — Encounter: Payer: Self-pay | Admitting: Student in an Organized Health Care Education/Training Program

## 2023-07-03 ENCOUNTER — Inpatient Hospital Stay
Admission: RE | Admit: 2023-07-03 | Payer: Self-pay | Source: Ambulatory Visit | Admitting: Student in an Organized Health Care Education/Training Program

## 2023-07-03 VITALS — BP 151/95 | HR 89 | Temp 98.1°F | Resp 16 | Wt 201.8 lb

## 2023-07-03 DIAGNOSIS — C50012 Malignant neoplasm of nipple and areola, left female breast: Secondary | ICD-10-CM

## 2023-07-03 NOTE — Progress Notes (Signed)
RADIATION ONCOLOGY CONSULTATION     Patient Name: [name]  Date of Birth: [birth year]  Medical Record Number: [id number]  Encounter Date: 07/03/2023  Radiation Oncology Physician: Lyman Speller, MD    Consulting Physicians and Providers:  Referring physician or provider: Dr. Lindi Adie  Primary care physician or provider: Roselie Skinner, MD  Breast surgical oncology: Dr. Lindi Adie  Plastic surgeon: Romero Liner, MD    DIAGNOSIS:     Left breast cancer      ASSESSMENT AND PLAN      67 year old woman with newly diagnosed LEFT breast cancer s/p mammogram, MRI and biopsy on 06/06/23 revealing cT1b (0.9 cm on MRI) N0 (no IM or axillary nodes on MRI), grade 2, ER+/PR+/HER2- IDC/ILC.  She is awaiting the results of her genetic testing but has met with Dr.Cocilovo and is planning to undergo a lumpectomy + SLNB (surgery date TBD).      We discussed that early stage breast cancer can be treated with mastectomy or lumpectomy followed by RT. We discussed that there is no difference in survival between these two treatment techniques. The patient prefers to preserve her breast and would like to proceed with lumpectomy.      We discussed that adjuvant RT can be given using multiple methods.  Depending on her pathology as well as whether she gets an oncoplasty, we can consider giving her APBI in 5 fx or whole breast RT over 3-4 weeks.  The patient may get an oncoplasty and understands that APBI is typically not recommended after oncoplasty.     We reviewed the process of simulation and the process of treatment.  Patients treated with breast irradiation typically develop erythema and/or hyperpigmentation and fatigue during the treatment.  Patients may experience mild breast pain, swelling or a feeling of warmth. Most patients experience resolution of these side effects within a few months of completing treatment.  However, some patients are left with hyperpigmentation and/or erythema, or experience persistent breast pain.   Changes in the breast contour, skin thickening, and some swelling or shrinkage of the breast can occur.  More significant changes like fibrosis and telangiectasias are less common.  We discussed the low risk of radiation pneumonitis, and secondary malignancy. We discussed the low risk of cardiac toxicity.      She agreed to proceed with RT if she undergoes lumpectomy. She understands that an Oncotype test will be done to determine her need for chemotherapy.  She will schedule a follow up with me after her surgery.    HISTORY OF PRESENTATION:     Patient has a significant family history of breast cancer and has undergone genetic testing, which is pending.    -05/04/2023: Screening mammogram revealed left breast focal asymmetry and question distortion for which further evaluation was recommended.        -05/14/2023: Diagnostic mammogram and US revealed a left breast 2:30 mass and a left breast 2:00 mass. Biopsy recommended. There is a persistent partially obscured mass in the upper outer quadrant of the  LEFT breast with a few associated punctate calcifications.     US showed that at 2:30, there is a hypoechoic mass measuring 0.6 cm corresponding to calcifications. At 2:00, there is a second mas measuring 0.6 cm.   At 3:00, there is a benign cyst.       [place] of LEFT axilla showed benign appearing lymph nodes.         -06/06/2023: Biopsy of the left breast masses performed. Pathology  revealed:                 -Left breast 2:30: usual ductal hyperplasia, columnar cell change and cysts, in a background of dense stromal fibrosis and PASH.                 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 No MRI evidence for malignancy  in the contralateral right breast. No axillary lymphadenopathy or internal mammary chain nodes.                -07/04/2023: Scheduled for plastics consult.  -08/06/2023: Surgery date pending.  Patient leaning towards lumpectomy.    Encounter type: In-person  Interpreter required: No interpreter was required for this visit  [name] presents today alone.    Range of motion issues: No  Extremity edema: No  Any issues healing after surgery:  NA  Surgical scar dehiscence:  NA  New mass or adenopathy noted in breast or lymph nodes:  NA  Previous breast biopsy or surgery: No  Family history of breast cancer or ovarian cancer: Yes - sister, maternal cousin and maternal great aunt  Genetic testing: Yes - pending; collected on 06/21/23    Gynecologic history:  Age of menarche: 78  Age of menopause: 87  Last menstrual period: 4  Pregnancy history: G7P6  Age of first childbrith: 67 years old  Exposure to hormone replacement therapy: No    SYSTEMIC THERAPY:     None currently    RADIATION RISK FACTORS AND HISTORY:     Prior radiotherapy: No  Prior systemic therapy: No  Cardiovascular implantable electronic device (CIED): None  Non-cardiac electronic device: No  Collagen vascular disease: No  Inflammatory bowel disease: No  Pregnancy status: Patient states she is post-menopausal and declines pregnancy test if radiotherapy is indicated  Prior reaction to IV contrast for imaging: No known prior allergy or reaction to either iodine or gadolinium contrast  Prior issues obtaining MRI: None    REVIEW OF SYSTEMS:     Fevers: No  Unintentional weight loss: No  Pain: No    Fall risk  -Have you fallen two or more times in the past year: No  -Did you suffer any injuries from your falls in the past year: No    A complete 14 point review of systems was performed and was negative with the exception of pertinent positives detailed here and in the HPI.    Review of Systems   All other systems reviewed and are  negative.      VITALS/PHYSICAL EXAM:     Vital signs reviewed in EPIC, including:  Vitals:    07/03/23 1504   BP: (!) 151/95   Pulse: 89   Resp: 16   Temp: 98.1 F (36.7 C)   SpO2: 99%     Wt Readings from Last 3 Encounters:   07/03/23 91.5 kg (201 lb 12.8 oz)   06/21/23 90.7 kg (200 lb)   05/02/23 91.8 kg (202 lb 6.4 oz)     Pain: 0 out of 10  KPS: 100 - Normal, no complaints    General: Well-appearing, pleasant female, in no acute distress.  Psychiatric: Alert and  oriented x3, thought content appropriate, euthymic.  Head: Normocephalic, without obvious abnormality.  Eyes: Conjunctivae and sclerae clear without icterus. Extraocular movements intact.  Lungs: Work of breathing appears unlabored.  Skin: Skin color appears normal.   Breast: No palpable masses in bilateral breasts or axilla                     Lyman Speller, MD        Attending Radiation Oncologist        Department of Advanced Radiation Oncology & Proton Therapy        [facility]        [address]        [place], [place] [address]             T [phone]  F [phone]        [email or web address]

## 2023-07-03 NOTE — Progress Notes (Signed)
RADIATION ONCOLOGY CONSULTATION NURSING INTAKE    Patient Name: [name]  Date of Birth: Jan 10, 1956  Medical Record Number: [id number]  Encounter Date: 07/03/2023  Radiation Oncology Physician: Lyman Speller, MD    Consulting Physicians and Providers:  Referring physician or provider: Dr. Lindi Adie  Primary care physician or provider: Roselie Skinner, MD  Breast surgical oncology: Dr. Lindi Adie  Plastic surgeon: Romero Liner, MD    DIAGNOSIS:     Left breast cancer    HISTORY OF PRESENTATION:     -05/04/2023: Screening mammogram revealed left breast focal asymmetry and question distortion for which further evaluation was recommended.  -05/14/2023: Diagnostic mammogram and US revealed a left breast 2:30 mass and a left breast 2:00 mass. Biopsy recommended.  -06/06/2023: Biopsy of the left breast masses performed. Pathology revealed:              -Left breast 2:30: usual ductal hyperplasia, columnar cell change and cysts, in a background of dense stromal fibrosis and PASH.              -Left breast 2:00: invasive mammary carcinoma with ductal and lobular features, ER 90, PR 70, HER2 -, Ki-67 28%  -06/25/2023: MRI of the breast revealed biopsy-proven left breast 2:00 mass measuring 0.9 cm and no additional suspicious enhancement. No MRI evidence for malignancy in the contralateral right breast. No axillary lymphadenopathy.  -07/04/2023: Scheduled for plastics consult.  -08/06/2023: Surgery date pending. Meeting with Dr. Mare Ferrari tomorrow 10/16.     Encounter type: In-person  Interpreter required: No interpreter was required for this visit  [name] presents today alone.    Range of motion issues: No  Extremity edema: No  Any issues healing after surgery:  NA  Surgical scar dehiscence:  NA  New mass or adenopathy noted in breast or lymph nodes:  NA  Previous breast biopsy or surgery: No  Family history of breast cancer or ovarian cancer: Yes - sister, maternal cousin and maternal great aunt  Genetic  testing: Yes - pending; collected on 06/21/23    Gynecologic history:  Age of menarche: 67  Age of menopause: 49  Last menstrual period: 89  Pregnancy history: G7P6  Age of first childbrith: 67 years old  Exposure to hormone replacement therapy: No    SYSTEMIC THERAPY:     None currently    RADIATION RISK FACTORS AND HISTORY:     Prior radiotherapy: No  Prior systemic therapy: No  Cardiovascular implantable electronic device (CIED): None  Non-cardiac electronic device: No  Collagen vascular disease: No  Inflammatory bowel disease: No  Pregnancy status: Patient states she is post-menopausal and declines pregnancy test if radiotherapy is indicated  Prior reaction to IV contrast for imaging: No known prior allergy or reaction to either iodine or gadolinium contrast  Prior issues obtaining MRI: None    REVIEW OF SYSTEMS:     Fevers: No  Unintentional weight loss: No  Pain: No    Fall risk  -Have you fallen two or more times in the past year: No  -Did you suffer any injuries from your falls in the past year: No    A complete 14 point review of systems was performed and was negative with the exception of pertinent positives detailed here and in the HPI.    Review of Systems   All other systems reviewed and are negative.      VITALS/PHYSICAL EXAM:     Vital signs reviewed in EPIC, including:  Vitals:  07/03/23 1504   BP: (!) 151/95   Pulse: 89   Resp: 16   Temp: 98.1 F (36.7 C)   SpO2: 99%     Wt Readings from Last 3 Encounters:   07/03/23 91.5 kg (201 lb 12.8 oz)   06/21/23 90.7 kg (200 lb)   05/02/23 91.8 kg (202 lb 6.4 oz)     Pain: 0 out of 10  KPS: 100 - Normal, no complaints    Berdie Ogren, RN

## 2023-07-04 ENCOUNTER — Ambulatory Visit: Payer: No Typology Code available for payment source

## 2023-07-06 ENCOUNTER — Encounter (INDEPENDENT_AMBULATORY_CARE_PROVIDER_SITE_OTHER): Payer: Self-pay | Admitting: MS"

## 2023-07-06 NOTE — Progress Notes (Signed)
Genetic Test Result Disclosure Note    Patient: [name]  DOB: [birth year]  Age: 67 y.o.    SUMMARY  Lynden Borak is a 67 y.o. female referred for genetic counseling regarding her personal and family history of breast cancer. After an initial genetic counseling consult, Klohie Seefeldt elected to pursue the 21-gene BRCANext-Expanded with RNAinsight genetic testing panel from [facility] The results of Demaria Steurer Nay's genetic testing were negative and therefore do not indicate an inherited predisposition to cancer. Discussion and interpretation of the patient's genetic testing result is provided below.     TEST PERFORMED:  Margrett Audley pursued the 21-gene BRCANext-Expanded with RNAinsight genetic testing panel from [facility]. I contacted [name] to review the results of this test by phone as previously arranged.  We spoke on 07/06/2023.    TEST RESULTS:  The results were negative, meaning that no mutations were identified in any of the genes analyzed.  Further, no variants of unknown significance were identified.         RESULT INTERPRETATION:  This negative result significantly reduces the likelihood that Guinevere Scarlet Fung's breast cancer developed due to an inherited gene mutation. However, this negative result unfortunately does not completely rule out this possibility:    1. There could be a mutation in one of the genes that the test did not detect, although the likelihood of this is low;    2. There could be a mutation in a gene that was not analyzed, either known or unknown;    3. It is possible that there is a hereditary risk present in other relatives that Armande Leyvas did not inherit. This is a possibility given her family history as reported - please see more information below.      4. Lastly, Guinevere Scarlet Kras's personal and family history of cancer may have developed due to a combination of genetic and other environmental factors that cannot  be specifically defined at this time.      However, because Fleeta Ames Nish's personal and family history is still suggestive of an inherited susceptibility to cancer, I remain concerned despite her negative genetic test result.   This is due to the fact that the patient has breast cancer, but postmenopausal and has a significant family history of breast cancer as reviewed below. We discussed that it is possible that other affected relative(s) in particular with earlier onset breast cancer, may have carried or carry a mutation in a cancer susceptibility gene that the patient does not.      While the likelihood is extremely small, there is always the possibility of human or laboratory error in performing a genetic test. The laboratories we utilize take every effort to maximize the accuracy of the tests performed. Since genetic test results have important implications for medical management, patients may wish to consider having the test repeated by submitting a fresh sample. Repeat testing may be particularly useful for unaffected individuals who are considering risk-reducing surgery based on positive results, or for individuals who test negative for a known mutation in the family.     Some individuals have different genetic variants within or between tissues (i.e., blood/saliva versus skin). The test may not detect genetic variants that are only present in some cells or tissues.    CANCER RISKS, SCREENING AND/OR PREVENTION RECOMMENDATIONS for Daniel Desjardin Doshi  Jayonna Koen is aware that even though a mutation was not identified on her genetic test, she may  still be at increased risk for developing another cancer for the reasons noted above. At this time we recommend that cancer screening and prevention be based on Nyasia Maguire Doyon's personal and/or family history of cancer.      Medical History   Latrise Burrous was diagnosed with breast cancer recently at the age of 78. 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 The patient has met with Dr. Lindi Adie and is planning on this time to undergo a probable lumpectomy. Other medical history:  - she just recently stopped smoking and reported that a pack would last her about 2-3 days. She reports no alcohol use.       Guinevere Scarlet Mcgonagle's relevant gynecologic history is as follows: the patient is G7P6. She reports no use of HRT following menopause.      Family History  Matilda Waldon Booz's family history is significant for the following:                 Family History   Cancer History Relation Name Age of Onset    Breast cancer Sister             Diagnoses in her late 51's, died @62  - metastatic disease    Heart disease Maternal Grandmother        Prostate cancer Paternal Grandfather        Breast cancer Other             Maternal great-aunt dx and died in her 18's    Breast cancer Other             Maternal first cousin once removed - daughter of great-aunt - dx and died in her 53's    Breast cancer Paternal Cousin   16    Breast cancer Paternal Cousin            Delorus Fesmire should review these recommendations in detail with her physicians to finalize a cancer screening plan, including the risks, benefits and limitations of each screening method.  All recommendations should be considered with regard to her personal history and current health. Recommendations may change as personal and/or family histories of cancer change, as additional genetic test results become available for Jovana Ill or relative(s) or as medical science advances. It is very important to note that surveillance (or cancer screening), no matter how well or carefully performed, does not guarantee that cancer will be detected at a stage that is curable or that will require minimal treatment.     BREAST: A woman's risk for developing another primary breast cancer depends on several factors,  including her current age and her age at first breast cancer diagnosis, menopausal status and other reproductive factors, the treatment she received for her first breast cancer and certain lifestyle choices such as tobacco and alcohol use.     Leotha Shorr Anacker's genetic test result does not clearly suggest that she has inherited an increased risk for another primary breast cancer. I encouraged Jaleah Prior to discuss her estimated risk for another primary breast cancer based on other factors with her physicians.    If Garland Clendenen undergoes a  lumpectomy or a single mastectomy, she should continue to follow any other treatment recommendations and screening regimen for remaining breast tissue as determined by her physician(s).       OVARIES: Raynesha Langreck Munley's genetic test results and  a reported lack of a family history of ovarian cancer indicate that her lifetime risk for ovarian cancer is expected to be the same as the general population risk of 1.1%.  This is based on studies that have followed women with personal and/or family histories of breast cancer, no family history of ovarian cancer and negative BRCA1 and BRCA2 genetic test results.    The screening for ovarian cancer consists of CA-125 blood tests and transvaginal ultrasounds (TVUS) with color Doppler but this has not been proven to detect the cancer at an early stage. For women who are expected to have a risk of ovarian cancer that is at or near to the general population risk, ovarian cancer screening is generally not recommended.   I encouraged Concetta Dangelo to have a careful discussion with her physician(s) that includes the benefits and limitations of screening to determine the best options for her. She should also make sure to undergo routine age-appropriate gynecological screening as recommended by her physician(s).     COLON: The general population (average) lifetime risk for colorectal cancer is approximately ~4.1%.  Studies have shown that a family history of colorectal cancer increases a person's risk.  The number of affected relatives, their degree of relationship and their age(s) at diagnosis should all be considered.    The patient reports a colonoscopy about 5 years ago. She reports the removal of polyps- not clear if all benign or some possible precancerous.     SKIN:  Given the high frequency of skin cancer in the general population, it is important that [name] perform skin exams and also have her doctor perform these exams on a regular basis.    LIFESTYLE MODIFIERS: In addition to family history, lifestyle factors may also influence cancer risk. These include diet, exercise and exposure to tobacco and alcohol. It is therefore important that [name] maintain a well-balanced diet, try to exercise on a regular basis, limit alcohol consumption, and also avoid tobacco use.      ADDITIONAL GENETIC TESTING RECOMMENDATIONS FOR Guinevere Scarlet Kamphaus:  Keandria Guisti is not a clear candidate for additional genetic testing at this time given that she received an expanded multi-gene panel. However, she will remain a reasonable candidate for additional testing in the future as more relevant cancer predisposition genes are identified.    Genetic testing is recommended for Guinevere Scarlet Bess's relative(s) - please see below.    INFORMATION FOR Guinevere Scarlet Mccarrick'S RELATIVES:    Based on this test result, Angelice Bua is not expected to have passed down a detectable mutation in any of the genes analyzed to her adult children.      Given remaining concern about possible hereditary risk as noted above, I recommend consideration of additional genetic testing for Kaleiyah Cauley Raimer's relatives. The best candidate(s) for genetic testing in the family are her affected paternal first cousins and the close family members (i.e., adult children) of her maternal family members that have passed away from  breast cancer.  The rationale for this would be to evaluate the possibility of a definable gene mutation present in other relatives that Genevie Feurtado has not inherited.  Identification of a mutation in a relative would not only provide useful information for that particular relative (and others), it may allow [place] to reinterpret [place]ia Naeve Withington's negative genetic test result with significantly more reassurance than is appropriate at this time.    All of Khrystyna Wildrick Krehbiel's relatives should  pursue cancer screening as recommended by their physicians based on their personal histories and - at a minimum - the general population recommendations.  I further recommend that they review the family history of cancer, Everleaner Lina Topping's genetic test results and have a discussion with their physicians about whether additional increased cancer screening and/or prevention is recommended.  Relatives may benefit from individualized genetic counseling to discuss personalized screening recommendations.      FOLLOW UP:  Although Algia Lewison Coach's test results were negative, we discussed the fact that it is normal to still have questions as to why she and/or her family members may have developed cancer.  If she and/or her physicians have other questions about her genetic testing and the material that was discussed we would be more than happy to have additional conversation(s).    Because information about cancer genetics is constantly changing and evolving, I encouraged Lamont Runner to contact me via phone or e-mail every 6-12 months to determine if there may be any new major updates or advances that are relevant for her. Ideally, she should also make [place] aware of any changes in her address or phone number so we can reach her in the future.  Neleh Mcclaskey should let us know if there are any changes to her personal and/or family histories, including if anyone else in the family pursues genetic testing for  hereditary cancer risk (no matter what the result) as these could influence the current risk assessment.  As noted above, genetic testing is recommended for relative(s).  It is often important to try to review medical, pathology and/or death certificates from family members that have been diagnosed with cancer, as this information may have an impact on the risk assessment provided to [name]. We would be interested in particular in reviewing records from Guinevere Scarlet Matzek's affected relatives.  As discussed, we can provide [name] with medical record request forms for her family members' records if she and/or they (their next of kin) are interested in and willing to participate in this process.    A copy of the test results was provided to [name] and will also be scanned into her [facility] electronic medical record as discussed. Copies can be sent to non-[facility] physician(s) as requested.      It was a pleasure to provide genetic counseling to [employer].  I look forward to staying in touch in the future and I remain available for any questions that arise.    Connee Ikner A. Ailea Rhatigan, MS, CGC  Certified and Licensed Press photographer, Pensions consultant [facility]  A Department of [facility]  [address]  [address]  [place], [place] [address]  [phone] - phone  [phone] - fax  [email or web address]

## 2023-07-13 ENCOUNTER — Other Ambulatory Visit: Payer: Self-pay | Admitting: Surgery

## 2023-07-13 DIAGNOSIS — C50912 Malignant neoplasm of unspecified site of left female breast: Secondary | ICD-10-CM

## 2023-07-20 ENCOUNTER — Other Ambulatory Visit: Payer: Self-pay | Admitting: Surgery

## 2023-07-20 DIAGNOSIS — C50912 Malignant neoplasm of unspecified site of left female breast: Secondary | ICD-10-CM

## 2023-07-26 ENCOUNTER — Encounter (INDEPENDENT_AMBULATORY_CARE_PROVIDER_SITE_OTHER): Payer: Self-pay

## 2023-07-26 ENCOUNTER — Telehealth (INDEPENDENT_AMBULATORY_CARE_PROVIDER_SITE_OTHER): Payer: No Typology Code available for payment source

## 2023-07-26 ENCOUNTER — Ambulatory Visit (INDEPENDENT_AMBULATORY_CARE_PROVIDER_SITE_OTHER): Payer: No Typology Code available for payment source

## 2023-07-26 NOTE — PSS Phone Screening (Signed)
 Pre-Anesthesia Evaluation    Pre-op phone visit requested by:   Reason for pre-op phone visit: Patient anticipating LEFT BREAST LUMPECTOMY; LEFT BREAST WIRE LOC; LEFT BREAST 2ND LESION WIRE LOC; LEFT BREAST SENTINEL LYMPH NODE AND  EXCISION AND MAPPING IN

## 2023-07-27 ENCOUNTER — Encounter (INDEPENDENT_AMBULATORY_CARE_PROVIDER_SITE_OTHER): Payer: Self-pay

## 2023-08-02 NOTE — Progress Notes (Addendum)
 Subjective:       Patient ID: [name] is a 67 y.o. female.    HPI  Patient had no breast complaints at the time of her screening mammogram when an area of distortion was noted.  Diagnostic imaging confirmed the need for biopsy.  Biopsy revealed

## 2023-08-05 NOTE — Anesthesia Preprocedure Evaluation (Signed)
 Anesthesia Evaluation    AIRWAY    Mallampati: II    TM distance: >3 FB  Neck ROM: full  Mouth Opening:full   CARDIOVASCULAR    cardiovascular exam normal       DENTAL        (+) upper dentures and lower dentures             PULMONARY    pulmonary exam nor

## 2023-08-06 ENCOUNTER — Ambulatory Visit: Payer: No Typology Code available for payment source | Admitting: Anesthesiology

## 2023-08-06 ENCOUNTER — Ambulatory Visit
Admission: RE | Admit: 2023-08-06 | Discharge: 2023-08-06 | Disposition: A | Discharge status: Home / Self Care | Payer: No Typology Code available for payment source | Source: Ambulatory Visit | Attending: Surgery | Admitting: Surgery

## 2023-08-06 ENCOUNTER — Encounter: Payer: Self-pay | Admitting: Surgery

## 2023-08-06 ENCOUNTER — Encounter
Admission: RE | Disposition: A | Discharge status: Home / Self Care | Payer: Self-pay | Source: Ambulatory Visit | Attending: Surgery

## 2023-08-06 ENCOUNTER — Ambulatory Visit: Payer: No Typology Code available for payment source | Admitting: Radiology

## 2023-08-06 ENCOUNTER — Ambulatory Visit: Payer: No Typology Code available for payment source

## 2023-08-06 DIAGNOSIS — J449 Chronic obstructive pulmonary disease, unspecified: Secondary | ICD-10-CM | POA: Insufficient documentation

## 2023-08-06 DIAGNOSIS — C50912 Malignant neoplasm of unspecified site of left female breast: Secondary | ICD-10-CM

## 2023-08-06 DIAGNOSIS — D242 Benign neoplasm of left breast: Secondary | ICD-10-CM | POA: Insufficient documentation

## 2023-08-06 DIAGNOSIS — N6032 Fibrosclerosis of left breast: Secondary | ICD-10-CM | POA: Insufficient documentation

## 2023-08-06 DIAGNOSIS — Z803 Family history of malignant neoplasm of breast: Secondary | ICD-10-CM | POA: Insufficient documentation

## 2023-08-06 DIAGNOSIS — I10 Essential (primary) hypertension: Secondary | ICD-10-CM | POA: Insufficient documentation

## 2023-08-06 DIAGNOSIS — N6323 Unspecified lump in the left breast, lower outer quadrant: Secondary | ICD-10-CM

## 2023-08-06 DIAGNOSIS — K769 Liver disease, unspecified: Secondary | ICD-10-CM | POA: Insufficient documentation

## 2023-08-06 DIAGNOSIS — C50412 Malignant neoplasm of upper-outer quadrant of left female breast: Secondary | ICD-10-CM | POA: Insufficient documentation

## 2023-08-06 DIAGNOSIS — R920 Mammographic microcalcification found on diagnostic imaging of breast: Secondary | ICD-10-CM | POA: Insufficient documentation

## 2023-08-06 DIAGNOSIS — N6082 Other benign mammary dysplasias of left breast: Secondary | ICD-10-CM | POA: Insufficient documentation

## 2023-08-06 DIAGNOSIS — Z170 Estrogen receptor positive status [ER+]: Secondary | ICD-10-CM | POA: Insufficient documentation

## 2023-08-06 HISTORY — PX: BIOPSY, BREAST, SENTINEL NODE: SHX3231

## 2023-08-06 HISTORY — PX: REVISION, SCAR LEVEL 1 (MEDICAL): SHX5442

## 2023-08-06 HISTORY — PX: TRANSFER/REARRANGEMENT, TISSUE, ADJACENT, TRUNK: SHX00055

## 2023-08-06 HISTORY — PX: PLACEMENT, PERCUTANEOUS, DEVICE, BREAST LOCALIZATION WITH ULTRASOUND GUIDANCE: SHX400058

## 2023-08-06 HISTORY — PX: MAMMOPLASTY, REDUCTION, BREAST HYPERTROPHY (MEDICAL): SHX4775

## 2023-08-06 HISTORY — PX: LYMPHANGIOGRAM/LYMPHANGIOGRAPHY: SHX00186

## 2023-08-06 HISTORY — PX: BREAST, LUMPECTOMY: SHX3314

## 2023-08-06 HISTORY — DX: Malignant neoplasm of unspecified site of left female breast: C50.912

## 2023-08-06 SURGERY — BREAST, LUMPECTOMY
Anesthesia: Anesthesia General | Site: Breast | Laterality: Left | Wound class: Clean

## 2023-08-06 MED ORDER — FAMOTIDINE 10 MG/ML IV SOLN (WRAP)
INTRAVENOUS | Status: AC
Start: 2023-08-06 — End: 2023-08-06
  Administered 2023-08-06: 20 mg via INTRAVENOUS
  Filled 2023-08-06: qty 2

## 2023-08-06 MED ORDER — FENTANYL CITRATE (PF) 50 MCG/ML IJ SOLN (WRAP)
INTRAMUSCULAR | Status: DC | PRN
Start: 2023-08-06 — End: 2023-08-06
  Administered 2023-08-06: 50 ug via INTRAVENOUS
  Administered 2023-08-06 (×2): 25 ug via INTRAVENOUS
  Administered 2023-08-06: 50 ug via INTRAVENOUS
  Administered 2023-08-06 (×2): 25 ug via INTRAVENOUS

## 2023-08-06 MED ORDER — PROPOFOL 10 MG/ML IV EMUL (WRAP)
INTRAVENOUS | Status: DC | PRN
Start: 2023-08-06 — End: 2023-08-06
  Administered 2023-08-06: 180 mg via INTRAVENOUS

## 2023-08-06 MED ORDER — GLYCOPYRROLATE 0.2 MG/ML IJ SOLN (WRAP)
INTRAMUSCULAR | Status: DC | PRN
Start: 2023-08-06 — End: 2023-08-06
  Administered 2023-08-06: .1 mg via INTRAVENOUS

## 2023-08-06 MED ORDER — ONDANSETRON HCL 4 MG/2ML IJ SOLN
INTRAMUSCULAR | Status: DC | PRN
Start: 2023-08-06 — End: 2023-08-06
  Administered 2023-08-06: 4 mg via INTRAVENOUS

## 2023-08-06 MED ORDER — MIDAZOLAM HCL 1 MG/ML IJ SOLN (WRAP)
INTRAMUSCULAR | Status: AC
Start: 2023-08-06 — End: ?
  Filled 2023-08-06: qty 2

## 2023-08-06 MED ORDER — ACETAMINOPHEN 500 MG PO TABS
ORAL_TABLET | ORAL | Status: AC
Start: 2023-08-06 — End: 2023-08-06
  Administered 2023-08-06: 1000 mg via ORAL
  Filled 2023-08-06: qty 2

## 2023-08-06 MED ORDER — METOCLOPRAMIDE HCL 5 MG/ML IJ SOLN
10.0000 mg | Freq: Once | INTRAMUSCULAR | Status: DC | PRN
Start: 2023-08-06 — End: 2023-08-06

## 2023-08-06 MED ORDER — OXYCODONE HCL 5 MG PO TABS
ORAL_TABLET | ORAL | Status: AC
Start: 2023-08-06 — End: 2023-08-06
  Filled 2023-08-06: qty 1

## 2023-08-06 MED ORDER — CEFAZOLIN SODIUM 2 G IJ SOLR
INTRAMUSCULAR | Status: DC
Start: 2023-08-06 — End: 2023-08-06
  Filled 2023-08-06: qty 2000

## 2023-08-06 MED ORDER — DEXAMETHASONE SODIUM PHOSPHATE 20 MG/5ML IJ SOLN
INTRAMUSCULAR | Status: AC
Start: 2023-08-06 — End: ?
  Filled 2023-08-06: qty 5

## 2023-08-06 MED ORDER — TECHNETIUM TC 99M TILMANOCEPT INJECTION
1.0000 | Freq: Once | Status: AC
Start: 2023-08-06 — End: 2023-08-06
  Administered 2023-08-06: 0.5 via INTRADERMAL
  Filled 2023-08-06: qty 0.5

## 2023-08-06 MED ORDER — FENTANYL CITRATE (PF) 50 MCG/ML IJ SOLN (WRAP)
INTRAMUSCULAR | Status: AC
Start: 2023-08-06 — End: 2023-08-06
  Filled 2023-08-06: qty 2

## 2023-08-06 MED ORDER — ISOSULFAN BLUE 1 % SC SOLN
SUBCUTANEOUS | Status: DC | PRN
Start: 2023-08-06 — End: 2023-08-06
  Administered 2023-08-06: 4 mL

## 2023-08-06 MED ORDER — PROPOFOL INFUSION 10 MG/ML
INTRAVENOUS | Status: DC | PRN
Start: 2023-08-06 — End: 2023-08-06
  Administered 2023-08-06: 100 ug/kg/min via INTRAVENOUS

## 2023-08-06 MED ORDER — GLYCOPYRROLATE 0.2 MG/ML IJ SOLN (WRAP)
INTRAMUSCULAR | Status: AC
Start: 2023-08-06 — End: ?
  Filled 2023-08-06: qty 1

## 2023-08-06 MED ORDER — SODIUM CHLORIDE 0.9 % IR SOLN
Status: DC | PRN
Start: 2023-08-06 — End: 2023-08-06
  Administered 2023-08-06: 1000 mL

## 2023-08-06 MED ORDER — LIDOCAINE HCL (PF) 2 % IJ SOLN
INTRAMUSCULAR | Status: AC
Start: 2023-08-06 — End: ?
  Filled 2023-08-06: qty 5

## 2023-08-06 MED ORDER — FAMOTIDINE 10 MG/ML IV SOLN (WRAP)
20.0000 mg | Freq: Once | INTRAVENOUS | Status: AC
Start: 2023-08-06 — End: 2023-08-06

## 2023-08-06 MED ORDER — PHENYLEPHRINE 100 MCG/ML IV BOLUS (ANESTHESIA)
PREFILLED_SYRINGE | INTRAVENOUS | Status: DC | PRN
Start: 2023-08-06 — End: 2023-08-06
  Administered 2023-08-06 (×4): 100 ug via INTRAVENOUS
  Administered 2023-08-06: 50 ug via INTRAVENOUS
  Administered 2023-08-06 (×2): 100 ug via INTRAVENOUS

## 2023-08-06 MED ORDER — LACTATED RINGERS IV SOLN
INTRAVENOUS | Status: DC
Start: 2023-08-06 — End: 2023-08-06

## 2023-08-06 MED ORDER — BUPIVACAINE-EPINEPHRINE (PF) 0.5% -1:200000 IJ SOLN
INTRAMUSCULAR | Status: AC
Start: 2023-08-06 — End: ?
  Filled 2023-08-06: qty 30

## 2023-08-06 MED ORDER — FENTANYL CITRATE (PF) 50 MCG/ML IJ SOLN (WRAP)
INTRAMUSCULAR | Status: AC
Start: 2023-08-06 — End: ?
  Filled 2023-08-06: qty 2

## 2023-08-06 MED ORDER — ONDANSETRON HCL 4 MG/2ML IJ SOLN
INTRAMUSCULAR | Status: AC
Start: 2023-08-06 — End: ?
  Filled 2023-08-06: qty 2

## 2023-08-06 MED ORDER — DEXAMETHASONE SODIUM PHOSPHATE 4 MG/ML IJ SOLN (WRAP)
INTRAMUSCULAR | Status: DC | PRN
Start: 2023-08-06 — End: 2023-08-06
  Administered 2023-08-06: 6 mg via INTRAVENOUS

## 2023-08-06 MED ORDER — ACETAMINOPHEN 325 MG PO TABS
650.0000 mg | ORAL_TABLET | Freq: Once | ORAL | Status: DC | PRN
Start: 2023-08-06 — End: 2023-08-06

## 2023-08-06 MED ORDER — LACTATED RINGERS IV SOLN
INTRAVENOUS | Status: DC | PRN
Start: 2023-08-06 — End: 2023-08-06

## 2023-08-06 MED ORDER — MIDAZOLAM HCL 1 MG/ML IJ SOLN (WRAP)
INTRAMUSCULAR | Status: DC | PRN
Start: 2023-08-06 — End: 2023-08-06
  Administered 2023-08-06: 1 mg via INTRAVENOUS

## 2023-08-06 MED ORDER — OXYCODONE HCL 5 MG PO TABS
5.0000 mg | ORAL_TABLET | Freq: Once | ORAL | Status: AC | PRN
Start: 2023-08-06 — End: 2023-08-06
  Administered 2023-08-06: 5 mg via ORAL

## 2023-08-06 MED ORDER — ISOSULFAN BLUE 1 % SC SOLN
SUBCUTANEOUS | Status: AC
Start: 2023-08-06 — End: ?
  Filled 2023-08-06: qty 5

## 2023-08-06 MED ORDER — LABETALOL HCL 5 MG/ML IV SOLN (WRAP)
20.0000 mg | Freq: Once | INTRAVENOUS | Status: DC | PRN
Start: 2023-08-06 — End: 2023-08-06

## 2023-08-06 MED ORDER — LIDOCAINE 4 % EX CREA
TOPICAL_CREAM | Freq: Once | CUTANEOUS | Status: DC | PRN
Start: 2023-08-06 — End: 2023-08-06

## 2023-08-06 MED ORDER — LIDOCAINE-EPINEPHRINE 1 %-1:100000 IJ SOLN
INTRAMUSCULAR | Status: AC
Start: 2023-08-06 — End: ?
  Filled 2023-08-06: qty 20

## 2023-08-06 MED ORDER — FENTANYL CITRATE (PF) 50 MCG/ML IJ SOLN (WRAP)
25.0000 ug | INTRAMUSCULAR | Status: AC | PRN
Start: 2023-08-06 — End: 2023-08-06
  Administered 2023-08-06 (×4): 25 ug via INTRAVENOUS

## 2023-08-06 MED ORDER — LIDOCAINE-EPINEPHRINE 1 %-1:100000 IJ SOLN
INTRAMUSCULAR | Status: DC | PRN
Start: 2023-08-06 — End: 2023-08-06
  Administered 2023-08-06: 10 mL

## 2023-08-06 MED ORDER — STERILE WATER FOR INJECTION IJ/IV SOLN (WRAP)
2.0000 g | Freq: Once | INTRAMUSCULAR | Status: AC
Start: 2023-08-06 — End: 2023-08-06
  Administered 2023-08-06: 2 g via INTRAVENOUS

## 2023-08-06 MED ORDER — ONDANSETRON HCL 4 MG/2ML IJ SOLN
4.0000 mg | Freq: Once | INTRAMUSCULAR | Status: DC | PRN
Start: 2023-08-06 — End: 2023-08-06

## 2023-08-06 MED ORDER — LIDOCAINE HCL 1 % IJ SOLN
1.0000 mL | Freq: Once | INTRAMUSCULAR | Status: DC | PRN
Start: 2023-08-06 — End: 2023-08-06

## 2023-08-06 MED ORDER — PROPOFOL 10 MG/ML IV EMUL (WRAP)
INTRAVENOUS | Status: AC
Start: 2023-08-06 — End: ?
  Filled 2023-08-06: qty 20

## 2023-08-06 MED ORDER — BUPIVACAINE-EPINEPHRINE (PF) 0.5% -1:200000 IJ SOLN
INTRAMUSCULAR | Status: DC | PRN
Start: 2023-08-06 — End: 2023-08-06
  Administered 2023-08-06: 10 mL

## 2023-08-06 MED ORDER — HYDROMORPHONE HCL 1 MG/ML IJ SOLN
0.5000 mg | INTRAMUSCULAR | Status: DC | PRN
Start: 2023-08-06 — End: 2023-08-06

## 2023-08-06 MED ORDER — ACETAMINOPHEN 500 MG PO TABS
1000.0000 mg | ORAL_TABLET | Freq: Once | ORAL | Status: AC
Start: 2023-08-06 — End: 2023-08-06

## 2023-08-06 MED ORDER — OXYCODONE HCL 5 MG PO TABS
5.0000 mg | ORAL_TABLET | Freq: Once | ORAL | Status: AC
Start: 2023-08-06 — End: 2023-08-06
  Administered 2023-08-06: 5 mg via ORAL

## 2023-08-06 MED ORDER — LIDOCAINE HCL (PF) 2 % IJ SOLN
INTRAMUSCULAR | Status: DC | PRN
Start: 2023-08-06 — End: 2023-08-06
  Administered 2023-08-06: 80 mg via INTRAVENOUS

## 2023-08-06 MED ORDER — PROPOFOL 10 MG/ML IV EMUL (WRAP)
INTRAVENOUS | Status: AC
Start: 2023-08-06 — End: ?
  Filled 2023-08-06: qty 100

## 2023-08-06 SURGICAL SUPPLY — 104 items
ADHESIVE LIQUID WATERPROOF VIAL PREP NONSTAIN MASTISOL STYRAX GUM (Skin Closure) ×2 IMPLANT
ADHESIVE LQ STYRAX GUM MASTIC ALC MTHY (Skin Closure) ×2
ADHESIVE SKIN CLOSURE DERMABOND ADVANCED (Skin Closure) ×2
ADHESIVE SKIN CLOSURE DERMABOND ADVANCED .7 ML LIQUID APPLICATOR (Skin Closure) ×2 IMPLANT
APPLICATOR CHLORAPREP 26 ML 70% ISOPROPYL ALCOHOL 2% CHLORHEXIDINE (Applicator) ×2 IMPLANT
APPLICATOR PRP 70% ISPRP 2% CHG 26ML (Applicator) ×2
APPLIER INTERNAL CLIP L9.75 IN AUTOMATIC (Procedure Accessories) ×2
APPLIER INTERNAL CLIP L9.75 IN AUTOMATIC PREMIUM SURGICLIP II SUPER (Procedure Accessories) ×2 IMPLANT
BANDAGE PROCARE COMPRESSION L5 YD X W3 (Procedure Accessories)
BANDAGE PROCARE COMPRESSION L5 YD X W3 IN 2 CLIP FASTENER BREATHABLE (Procedure Accessories) IMPLANT
BLADE 10 CLASSIC CARBON STEEL SURGICAL (Blade) ×12 IMPLANT
BULB DRAINAGE LIGHTWEIGHT LOW LEVEL (Drain) ×4
BULB DRAINAGE LIGHTWEIGHT LOW LEVEL SUCTION RELIAVAC SILICONE 100 CC (Drain) ×4 IMPLANT
CLIP INTERNAL MEDIUM LARGE CHEVRON 6 (Clips) ×4 IMPLANT
CONTAINER PATH PP 8OZ LF NS LEK RST LID (Procedure Accessories)
CONTAINER PATHOLOGY LEAK RESISTANT LID (Procedure Accessories) ×4
CONTAINER PATHOLOGY LK RSSTNT LID FRZBL MEDLN POLYPROPYLENE 165 OZ (Procedure Accessories) ×4 IMPLANT
CONTAINER PATHOLOGY POLYPROPYLENE LK ESSTNT LID FRZBL 8OZ NONSTRL LF (Procedure Accessories) IMPLANT
COVER FLEXIBLE LIGHT HANDLE PLASTIC GREEN (Procedure Accessories) ×4 IMPLANT
COVER FLEXIBLE MEDLINE LIGHT HANDLE (Procedure Accessories) ×4
COVER TRANSDUCER L244 CM X W15.2 CM (Other) ×2
COVER TRANSDUCER L244 CM X W15.2 CM TELESCOPIC FOLD COLOR ELASTIC BAND (Other) ×2 IMPLANT
DEVICE CLOSURE L18 IN 3-0 P-12 V-LOC 90 (Suture)
DRAIN OD15 FR 4 FREE FLOW CHANNEL (Drain) ×4
DRAIN OD15 FR 4 FREE FLOW CHANNEL RADIOPAQUE TROCAR BARD L3/16 IN (Drain) ×4 IMPLANT
DRESSING TRANSPARENT L4 3/4 IN X W4 IN (Dressing) ×6
DRESSING TRANSPARENT L4 3/4 IN X W4 IN POLYURETHANE ADHESIVE (Dressing) ×6 IMPLANT
DYE TISSUE MARKING ASSORTED CDI FOIL (Other) ×2
DYE TISSUE MARKING ASSORTED CDI FOIL SEAL (Other) ×2 IMPLANT
ELECTRODE ADULT PATIENT RETURN L9 FT REM POLYHESIVE ACRYLIC FOAM (Procedure Accessories) ×4 IMPLANT
ELECTRODE ELECTROSRGCL BLADE L2.75IN OD3/32 IN L.2IN STD SHAFT HEX LCK (Cautery) ×4 IMPLANT
ELECTRODE ELECTROSURGICAL BLADE L2.75 IN (Cautery) ×4
ELECTRODE ELECTROSURGICAL BLADE L4 IN (Cautery) ×2
ELECTRODE ELECTROSURGICAL BLADE L4 IN OD3/32 IN EDGE L.2 IN INSULATE (Cautery) ×2 IMPLANT
ELECTRODE ELECTROSURGICAL NEEDLE L3 CM (Cautery)
ELECTRODE ELECTROSURGICAL NEEDLE L3 CM OD3/32 IN COLORADO TUNGSTEN (Cautery) IMPLANT
ELECTRODE PATIENT RETURN L9 FT VALLEYLAB (Procedure Accessories) ×4
GLOVE SURGICAL 6 1/2 BIOGEL PI INDICATOR (Glove) ×2
GLOVE SURGICAL 6 1/2 BIOGEL PI INDICATOR UNDERGLOVE POWDER FREE SMOOTH (Glove) ×2 IMPLANT
GLOVE SURGICAL 6.5 BIOGEL PI MICRO (Glove) ×4
GLOVE SURGICAL 6.5 BIOGEL PI MICRO POWDER FREE BEAD CUFF MICRO ROUGHEN (Glove) ×4 IMPLANT
GLOVE SURGICAL 7 1/2 BIOGEL PI (Glove) ×4
GLOVE SURGICAL 7 1/2 BIOGEL PI ULTRATOUCH M POWDER FREE BEAD CUFF (Glove) ×4 IMPLANT
GLOVE SURGICAL 7 BIOGEL PI ULTRATOUCH M (Glove)
GLOVE SURGICAL 7 BIOGEL PI ULTRATOUCH M POWDER FREE TEXTURE BEAD CUFF (Glove) IMPLANT
LEGGINGS SURGICAL L48 IN X W31 IN (Drape) ×2
LEGGINGS SURGICAL L48 IN X W31 IN TELESCOPIC FOLD CUFF L6 IN (Drape) ×2 IMPLANT
MARKER BREAST BIOPSY ADAPTABLE NEEDLE (Clip) ×2 IMPLANT
MARKER BREAST BIOPSY ADAPTABLE NEEDLE RADIOPAQUE VERAFORM (Clip) ×2 IMPLANT
PACK SRG LF STRL MAJ BRST WOM DISP (Pack) ×4
PAD ABDOMINAL L9 IN X W5 IN 4 SEAL EDGE (Dressing) ×2 IMPLANT
PAD ABDOMINAL L9 IN X W5 IN ABSORBENT (Procedure Accessories) ×4
PAD ABDOMINAL L9 IN X W5 IN ABSORBENT NONWOVEN HYDROPHOBIC BACK SEAL (Procedure Accessories) ×4 IMPLANT
PAD SRGPRP 44X24IN NS CUF 9IN (Prep) ×2 IMPLANT
PADDING CAST L4 YD X W6 IN UNDERCAST (Bandage) ×4
PADDING CAST L4 YD X W6 IN UNDERCAST HAND TEARABLE SPECIALIST 100 (Bandage) ×4 IMPLANT
PEN SURGICAL MARKING MEDLINE SKIN RULER (Positioning Supplies) ×8
PEN SURGICAL MARKING SKIN RULER LARGE RESERVOIR REGULAR TIP LABEL (Positioning Supplies) ×8 IMPLANT
RETRACTOR 4 INTERCHANGEABLE BLADE (Procedure Accessories)
RETRACTOR 4 INTERCHANGEABLE BLADE CORDLESS LED NONCONDUCTIVE RADIALUX (Procedure Accessories) IMPLANT
SET TUBING .625IN .375IN 120IN SUCTION PVC VASER STRL LF REUSABLE (Other) IMPLANT
SET TUBING INFILTRATION PVC VASER STERILE LATEX FREE REUSABLE (Other) IMPLANT
SET TUBING PVC VASER .625IN .375IN 120IN (Other)
SET TUBING PVC VASER LF STRL INFLTR (Other)
SOLUTION IRRIGATION 0.9% BALANCED SALT (Irrigation Solutions)
SOLUTION IRRIGATION 0.9% BALANCED SALT 15 ML (Irrigation Solutions) IMPLANT
SOLUTION PREP LIQUID ANTIMICROBIAL FLIP (Prep) ×2
SOLUTION PREP LIQUID ANTIMICROBIAL FLIP TOP BOTTLE ANTISEPTIC DYNA-HEX (Prep) ×2 IMPLANT
SPONGE GAUZE L4 IN X W4 IN 4 PLY (Procedure Accessories) ×6
SPONGE GAUZE L4 IN X W4 IN 4 PLY ABSORBENT NONWOVEN NONADHERENT RAYON (Procedure Accessories) ×6 IMPLANT
STAPLER SKIN L4.1 MM X W6.5 MM 35 WIDE (Staplers) ×2
STAPLER SKIN L4.1 MM X W6.5 MM 35 WIDE STAPLE CARTRIDGE APPOSE ULC (Staplers) ×2 IMPLANT
STRIP SKIN CLOSURE L4 IN X W1/2 IN (Dressing) ×2
STRIP SKIN CLOSURE L4 IN X W1/2 IN REINFORCE STERI-STRIP POLYESTER (Dressing) ×2 IMPLANT
STRIP SKIN CLOSURE L4 IN X W1/4 IN (Dressing)
STRIP SKIN CLOSURE L4 IN X W1/4 IN REINFORCE STERI-STRIP POLYESTER (Dressing) IMPLANT
STRIP SKIN CLOSURE L5 IN X W1 IN (Dressing) ×2
STRIP SKIN CLOSURE L5 IN X W1 IN REINFORCE STERI-STRIP POLYESTER WHITE (Dressing) ×2 IMPLANT
SUTURE ETHILON BLACK 3-0 PS-1 L18 IN (Suture) ×2
SUTURE ETHILON BLACK 3-0 PS-1 L18 IN MONOFILAMENT NONABSORBABLE (Suture) ×2 IMPLANT
SUTURE MONOCRYL 3-0 PS-2 L27 IN (Suture) ×2
SUTURE MONOCRYL 3-0 PS-2 L27 IN MONOFILAMENT UNDYED ABSORBABLE (Suture) ×2 IMPLANT
SUTURE MONOCRYL 3-0 SH L27 IN (Suture) ×2
SUTURE MONOCRYL 3-0 SH L27 IN MONOFILAMENT UNDYED ABSORBABLE (Suture) ×2 IMPLANT
SUTURE MONOCRYL 4-0 PS-2 L27 IN (Suture) ×4
SUTURE MONOCRYL 4-0 PS-2 L27 IN MONOFILAMENT UNDYED ABSORBABLE (Suture) ×4 IMPLANT
SUTURE PDS II 2-0 CT-1 L27 IN (Suture) ×2
SUTURE PDS II 2-0 CT-1 L27 IN MONOFILAMENT VIOLET ABSORBABLE (Suture) ×2 IMPLANT
SUTURE PDS II 3-0 SH L27 IN MONOFILAMENT (Suture) ×4
SUTURE PDS II 3-0 SH L27 IN MONOFILAMENT VIOLET ABSORBABLE (Suture) ×4 IMPLANT
SUTURE SILK PERMA HAND BLACK 0 SH L30 IN (Suture) ×2
SUTURE SILK PERMA HAND BLACK 0 SH L30 IN BRAID NONABSORBABLE (Suture) ×2 IMPLANT
SUTURE V-LOC 90 3-0 P-12 3/8 CIRCLE L18 IN ABS UNDYED (Suture) IMPLANT
SYRINGE 10 ML CONTROL CONCENTRIC TIP (Syringes, Needles) ×16
SYRINGE 10 ML CONTROL CONCENTRIC TIP PYROGEN FREE DEHP FREE LOK (Syringes, Needles) ×16 IMPLANT
SYSTEM WOUND IRRIGATION DEBRIDEMENT (Solution) ×2
SYSTEM WOUND IRRIGATION DEBRIDEMENT CLEANSING IRRISEPT 0.05% (Solution) ×2 IMPLANT
TIP SUCTION MEDLINE STANDARD FLANGE ON (Procedure Accessories) ×4
TIP SUCTION STANDARD FLANGE ON OFF CONTROL RIGID (Procedure Accessories) ×4 IMPLANT
TRAY SKIN SCRUB L8 IN 6 WING 6 SPONGE STICK 2 TIP APPLICATOR DRY VINYL (Prep) ×2 IMPLANT
TRAY SKIN SCRUB MEDLINE L8 IN VINYL (Prep) ×2
TRAY SURGICAL MAJOR BREAST (Pack) ×4 IMPLANT
TUBING SUCTION ID3/16 IN L10 FT (Tubing) ×2
TUBING SUCTION ID3/16 IN L10 FT NONCONDUCTIVE STRAIGHT MALE FEMALE (Tubing) ×2 IMPLANT

## 2023-08-06 NOTE — Transfer of Care (Signed)
 Anesthesia Transfer of Care Note    Patient: [name]    Procedures performed: Procedure(s) with comments:  LEFT BREAST LUMPECTOMY; LEFT BREAST WIRE LOC; LEFT BREAST 2ND LESION WIRE LOC; LEFT BREAST SENTINEL LYMPH NODE AND  EXCISION AND MAPPING

## 2023-08-06 NOTE — Anesthesia Postprocedure Evaluation (Signed)
 Anesthesia Post Evaluation    Patient: [name]    Procedure(s) with comments:  LEFT BREAST LUMPECTOMY; LEFT BREAST WIRE LOC; LEFT BREAST 2ND LESION WIRE LOC; LEFT BREAST SENTINEL LYMPH NODE AND  EXCISION AND MAPPING IN OR BY MD  PLACEMENT, PE

## 2023-08-06 NOTE — Brief Op Note (Signed)
 BRIEF OP NOTE    Date Time: 08/06/23 9:14 AM    Patient Name:   [name], [name] (MRN: [id number])    Date of Operation:   08/06/2023     Providers Performing:   Surgeons and Role:  Panel 1:     * Tilda Burrow, MD - Primary     * Mitnick, Carmie Kanner

## 2023-08-06 NOTE — Discharge Instr - AVS First Page (Addendum)
 [facility] [phone]  DR. COSTANZA COCILOVO  DISCHARGE INSTRUCTIONS AFTER BREAST SURGERY    1. The dressing is water proof and if it looks intact you may shower. 48 hours after surgery remove the dressing. Wash the incision gently with warm wat

## 2023-08-06 NOTE — Op Note (Signed)
 Procedure Date: 08/06/2023    Patient Type: A    SURGEON: Matisyn Cabeza L. Naiya Corral, MD    ASSISTANT:  Hillard Danker, PA-C    PREOPERATIVE DIAGNOSES:  1.  Left breast cancer.  2.  Acquired deformity left breast.    POSTOPERATIVE DIAGNOSES:  1.  Left breast cance

## 2023-08-06 NOTE — Op Note (Signed)
 Procedure Date: 08/06/2023    Patient Type: A    SURGEON: Tilda Burrow, MD    PREOPERATIVE DIAGNOSES:  Left 2:30 cancer and left 3 o'clock benign lesion.    POSTOPERATIVE DIAGNOSES:  Left 2:30 cancer and left 3 o'clock benign lesion.    TITLE OF PRO

## 2023-08-07 ENCOUNTER — Encounter: Payer: Self-pay | Admitting: Surgery

## 2023-08-10 LAB — SURGICAL PATHOLOGY

## 2023-08-15 NOTE — Progress Notes (Signed)
Subjective:       Patient ID: [name] is a 67 y.o. female.    HPI  Patient is here for a postoperative visit  Her sister had breast cancer at age 50.    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 No additional  suspicious enhancement.  2. No MRI evidence for malignancy in the contralateral right breast.  3. No axillary lymphadenopathy.    Treatment Team  Ref Prov:  Primary Care Physician:   Roselie Skinner, MD Radiology facility: [facility])--[phone] Breast Surgeon : Tilda Burrow MD  [phone]   Plastic Surgeon Romero Liner MD [phone] Radiation Onc:  Quentin Mulling, MD Medical Onc:  Monte Fantasia, MD      Breast Cancer Comprehensive Care Plan Summary  Date of diagnosis: 06/06/23 Event : Primary Breast Cancer ECOG Performance: Grade 0  (Fully active) Genetic Testing :21-gene BRCANext-Expanded with RNAinsight     Presentation : abnormal mammogram with mass Side: left Focality :  unifocal Location: radian 2:30   Biologic Tumor characteristics  Tumor: Invasive Mammary Carcinoma with ductal and lobular Grade: nuclear grade II  Ki-67:  28 % Oncotype Dx:  Score    Estrogen Receptor: positive >90 % Progesterone Receptor: positive 70 % Her-2-neu Receptor: 0 , negative    Staging  Tumor Size:   0.8 cm Nodes: 0 sentinel nodes out of 2 Systemic Metastases: clinically negative Stage:  Stage IA, T1 N0 M0   Treatment  Modality  Date    Surgery   08/06/23 left, partial mastectomy, sentinel node biopsy, and oncoplastic reconstruction/mammoplasty   Radiation completion date:   whole breast accelerated  radiation and external beam APBI  radiation   Endocrine Start date:   Anticipated duration:  5-10 years   Anastrozole 1 mg/d (Arimedex) and Letrozole 2.5 mg/d (Femara)   Chemotherapy Completion date: Adjuvant and not indicated     Biologic  not indicated   Clinical trial           The following portions of the patient's history were reviewed and updated as appropriate: allergies, current medications, past family history, past medical history, past social history, past surgical history, and problem list.    Review of Systems   Constitutional:  Positive for unexpected weight gain.   HEENT:   Positive for congestion.    Respiratory: Negative.     Cardiovascular:  Positive for leg pain.   Gastrointestinal: Negative.    Endocrine: Negative.    Musculoskeletal:  Positive for arthralgias, back pain and flank pain.   Skin: Negative.    Neurological:  Positive for numbness and tingling.   Hematological: Negative.    Psychiatric/Behavioral: Negative.     All other systems reviewed and are negative.          Objective:    Physical Exam    LN:  No axillary, supraclavicular or cervical adenopathy  Breast: Left breast reduction healing well    Assessment:       Left 2:00  Cancer  Left 230 benign      Plan:      Procedures    Will meet with medical and radiation oncology.  She will have her mammogram in the spring and then return for a survivorship plan with the nurse practitioner

## 2023-08-19 ENCOUNTER — Ambulatory Visit
Admission: RE | Admit: 2023-08-19 | Disposition: A | Discharge status: Home / Self Care | Payer: No Typology Code available for payment source | Source: Ambulatory Visit | Attending: Surgery | Admitting: Surgery

## 2023-08-21 ENCOUNTER — Ambulatory Visit: Payer: No Typology Code available for payment source | Attending: Surgery | Admitting: Surgery

## 2023-08-21 VITALS — BP 133/82 | HR 112 | Ht 60.0 in | Wt 204.0 lb

## 2023-08-21 DIAGNOSIS — C50412 Malignant neoplasm of upper-outer quadrant of left female breast: Secondary | ICD-10-CM | POA: Insufficient documentation

## 2023-08-21 DIAGNOSIS — Z170 Estrogen receptor positive status [ER+]: Secondary | ICD-10-CM | POA: Insufficient documentation

## 2023-08-23 ENCOUNTER — Other Ambulatory Visit: Payer: Self-pay

## 2023-08-26 ENCOUNTER — Encounter (INDEPENDENT_AMBULATORY_CARE_PROVIDER_SITE_OTHER): Payer: Self-pay

## 2023-09-17 ENCOUNTER — Encounter: Payer: Self-pay | Admitting: Student in an Organized Health Care Education/Training Program

## 2023-09-17 ENCOUNTER — Ambulatory Visit: Payer: Self-pay | Admitting: Student in an Organized Health Care Education/Training Program

## 2023-09-19 ENCOUNTER — Ambulatory Visit
Admission: RE | Admit: 2023-09-19 | Discharge: 2023-09-19 | Disposition: A | Discharge status: Home / Self Care | Payer: No Typology Code available for payment source | Source: Ambulatory Visit | Attending: Surgery | Admitting: Surgery

## 2023-09-19 DIAGNOSIS — Z17411 Hormone receptor positive with human epidermal growth factor receptor 2 negative status: Secondary | ICD-10-CM | POA: Insufficient documentation

## 2023-09-19 DIAGNOSIS — C50512 Malignant neoplasm of lower-outer quadrant of left female breast: Secondary | ICD-10-CM | POA: Insufficient documentation

## 2023-09-20 DIAGNOSIS — Z170 Estrogen receptor positive status [ER+]: Secondary | ICD-10-CM | POA: Insufficient documentation

## 2023-09-20 NOTE — Assessment & Plan Note (Signed)
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

## 2023-09-20 NOTE — Progress Notes (Unsigned)
[name] [facility]  [address] [name]  [phone]      [name] Office  [phone]          Visit Date: 09/20/2023    Chief Complaint   Patient presents with    Initial Consult     Initial consult to establish managed care for breast cancer 5/10  bone pain          HISTORY OF PRESENT ILLNESS:    [name] is a 68 year old woman with newly diagnosed LEFT breast cancer s/p mammogram, MRI and biopsy on 06/06/23 revealing cT1b (0.9 cm on MRI) N0 (no IM or axillary nodes on MRI), grade 2, ER+/PR+/HER2- IDC/ILC who presents today for initial medical oncology consultation after lumpectomy on 08/06/23    Oncology History Overview Note   Physician Requesting Consult: Kathryn Burrow, MD    Breast Surgeon: Kathryn Burrow, MD  Radiation Oncologist: Kathryn Mulling, MD  Plastic Surgeon:   PCP: Kathryn Skinner, MD      [name] is a 68 y.o. 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     Bilateral screening mammogram 05/04/2023 showed:   Left breast focal asymmetry and question distortion for which further  evaluation is recommended.    Diagnostic mammogram/ultrasound 05/13/2024 showed:   ULTRASOUND:  Targeted ultrasound of the left lateral breast performed to evaluate the  questioned mammographic finding.       At 2:30 7 cm from the nipple there is an oval circumscribed mildly  hypoechoic mass with internal echogenicities. This is felt to correspond to  the mammographic asymmetry with calcifications. This measures 0.6 x 0.3 x  0.5 cm.      At 3:00 in the periareolar breast there is an incidentally detected  benign-appearing cyst measuring 0.2 x 0.3 x 0.3 cm.     At 2:00 4 cm from the nipple there is an irregular hypoechoic mass  measuring 0.6 x 0.5 x 0.5 cm felt to be a possible correlate to the  questioned mammographic distortion.     Ultrasound of the left axilla demonstrated benign-appearing lymph  nodes  with preserved morphology.     IMPRESSION:         1. Left breast 2:30 mass for which ultrasound-guided core biopsy is  recommended.  2. Left breast 2:00 mass for which ultrasound-guided core biopsy is  recommended. Recommend correlation of clip placement to the mammographic  distortion.        Ultrasound guided biopsy left breast 06/06/2023  showed:  Left Breast 2:30 7 cm FN. Usual ductal hyperplasia, columnar cell change  and cysts, in a background of dense stromal fibrosis and pseudoangiomatous  stromal hyperplasia (PASH).     Left Breast 2:00 4 cm FN.   Invasive mammary carcinoma with ductal and  lobular features (tubule formation 3/3, nuclear pleomorphism 2/3, mitotic  count 1/3), measuring 0.45 cm in maximal length in this material.    ER>90%, PR 70%, HER2 IHC 0, Ki-67 28%       MRI bilateral breasts 06/25/2023 showed:  FINDINGS:  Amount of glandular tissue: There is scattered fibroglandular tissue.  Background parenchymal enhancement:  Mild.     Right breast: No suspicious enhancement.     Left breast: At the 2:00 position, anterior depth, there is an irregular  enhancing mass measuring 0.5 x 0.7 x 0.9 cm demonstrating persistent type I  enhancement corresponding to biopsy-proven invasive  mammary carcinoma.  Biopsy marker is seen adjacent.     No additional suspicious enhancement in the left breast.  Susceptibility artifact from a second biopsy marker is seen in the upper  outer quadrant more posteriorly and inferiorly corresponding to benign  ultrasound-guided biopsy at 2:30 position without suspicious enhancement.     Lymph nodes: No enlarged axillary or internal mammary chain lymph nodes.     IMPRESSION:     1. Biopsy-proven left breast 2:00 mass measuring 0.9 cm. No additional  suspicious enhancement.  2. No MRI evidence for malignancy in the contralateral right breast.  3. No axillary lymphadenopathy.      S/p left lumpectomy and sentinel node biopsy on 08/06/2023    Final Pathology  showed:  -invasive ductal and lobular carcinoma   -pT1b (0.8 cm)  -G2  -pN0 (0/2)  -Margins negative  -LVI absent  -Associated LCIS    Oncotype 21 (7 % risk of systemic recurrence after 5 years of endocrine therapy)        Breast Cancer Risk Factors:  Age at Menarche:  37     Age at 70st FTP:  46   Still menstruating?:  No   Age at last menses: 51  Hysterectomy: No, partial, TAH/BSO  Estrogen Therapy:          Oral Contraceptives: No           Hormone Replacement Therapy:  No         Reproductive Assistance Therapies:  No  Family History of Cancer:            Breast:   Yes Sister, maternal cousin, and maternal great aunt         Ovarian:No         Colon: No                Prostate:  Yes          Pancreatic:  No         Melanoma: No  Ashkenazi Jewish Ancestry: No       Genetic testing negative       Denies F/C/N/V/diarrhea. No CP/SOB. No HA/visual changes/focal weakness.       Malignant neoplasm of left breast in female, estrogen receptor positive   09/20/2023 Initial Diagnosis    Malignant neoplasm of left breast in female, estrogen receptor positive        Cancer Staging   Malignant neoplasm of left breast in female, estrogen receptor positive  Staging form: Breast, AJCC 8th Edition  - Clinical stage from 09/21/2023: Stage IA (cT1b, cN0(sn), cM0, G2, ER+, PR+, HER2-, Oncotype DX score: 21) - Signed by Kathryn Frederick, MD on 09/21/2023  Stage prefix: Initial diagnosis  Method of lymph node assessment: Sentinel lymph node biopsy  Multigene prognostic tests performed: Oncotype DX  Recurrence score range: Greater than or equal to 11  Histologic grading system: 3 grade system  Percentage of positive estrogen receptors (%): 90  Percentage of positive progesterone receptors (%): 70  HER2-IHC interpretation: Negative  HER2-IHC value: Score 0  Ki-67 (%): 28  Oncotype DX recurrence risk level: Low risk      Medical History[1]  Past Surgical History[2]  Social History     Socioeconomic History    Marital status: Single     Spouse  name: Not on file    Number of children: Not on file    Years of education: Not on file    Highest education level: Not on file  Occupational History    Not on file   Tobacco Use    Smoking status: Some Days     Current packs/day: 0.25     Average packs/day: 0.3 packs/day for 30.0 years (7.5 ttl pk-yrs)     Types: Cigarettes    Smokeless tobacco: Never    Tobacco comments:     a pack lasts several days   Vaping Use    Vaping status: Never Used   Substance and Sexual Activity    Alcohol use: No    Drug use: Yes     Comment: Marijuana    Sexual activity: Not on file   Other Topics Concern    Not on file   Social History Narrative    Caffeine: drinks decaff tea    Diet: tries to stay well balanced    Exercise: not really. 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     Pets: none grand dog    Hobbies:  Grand kids     Social Drivers of Psychologist, prison and probation services Strain: High Risk (06/18/2023)    Overall Financial Resource Strain (CARDIA)     Difficulty of Paying Living Expenses: Hard   Food Insecurity: No Food Insecurity (06/18/2023)    Hunger Vital Sign     Worried About Running Out of Food in the Last Year: Never true     Ran Out of Food in the Last Year: Never true   Transportation Needs: No Transportation Needs (06/18/2023)    PRAPARE - Therapist, art (Medical): No     Lack of Transportation (Non-Medical): No   Physical Activity: Inactive (06/18/2023)    Exercise Vital Sign     Days of Exercise per Week: 2 days     Minutes of Exercise per Session: 0 min   Stress: Stress Concern Present (06/18/2023)    [name] Occupational Health - Occupational Stress Questionnaire     Feeling of Stress : Very much   Social Connections: Moderately Isolated (06/18/2023)    Social Connection and Isolation Panel [NHANES]     Frequency of Communication with Friends and Family: More than three times a week     Frequency of Social Gatherings with Friends and Family: Once a week     Attends  Religious Services: More than 4 times per year     Active Member of [employer] or Organizations: No     Attends Banker Meetings: Never     Marital Status: Never married   Intimate Partner Violence: Not At Risk (06/18/2023)    Humiliation, Afraid, Rape, and Kick questionnaire     Fear of Current or Ex-Partner: No     Emotionally Abused: No     Physically Abused: No     Sexually Abused: No   Housing Stability: High Risk (06/18/2023)    Housing Stability Vital Sign     Unable to Pay for Housing in the Last Year: Yes     Number of Times Moved in the Last Year: 0     Homeless in the Last Year: No      Family History   Problem Relation Age of Onset    Hypertension Mother     Stroke Mother     Hypertension Father     Arthritis Father     Breast cancer Sister         Diagnoses in her late 65's, died @62  -  metastatic disease    Kidney disease Brother     Heart disease Maternal Grandmother     Prostate cancer Paternal Grandfather     Breast cancer Other         Maternal great-aunt dx and died in her 11's    Breast cancer Other         Maternal first cousin once removed - daughter of great-uant - dx and died in her 74's    Breast cancer Paternal Cousin 14    Breast cancer Paternal Cousin 30     OB History       Gravida   7    Para   6    Term   6    Preterm        AB   1    Living   6         SAB        IAB        Ectopic        Multiple        Live Births                    Allergies[3]  has a current medication list which includes the following prescription(s): albuterol sulfate hfa, atorvastatin, betamethasone dipropionate, elderberry, trelegy ellipta, losartan-hydrochlorothiazide, magnesium, nebulizer, vitamin d, and vitamin e.        REVIEW OF SYSTEMS:    Constitutional:  negative  Eyes: negative  Ears, nose, mouth, throat, and face: negative  Respiratory: negative  Cardiovascular: negative  Gastrointestinal: negative  Genitourinary: negative  Integument/breast: negative  Hematologic/lymphatic:  negative  Musculoskeletal: negative  Neurological: negative  Behavioral/Psych: negative  Endocrine: negative  Allergic/Immunologic: negative    PHYSICAL EXAM:  There were no vitals taken for this visit.        Performance Status:  0 - Fully active, able to carry on all pre-disease performance without restriction         General Appearance: Alert, cooperative, no distress, appears stated age  Head:  Normocephalic, without obvious abnormality, atraumatic  Eyes: PERRL, conjunctiva/corneas clear bilaterally  Ears: Normal external ear canals bilaterally  Nose: Nares normal, septum midline, mucosa normal, no drainage or sinus tenderness  Throat: Lips, mucosa, and tongue normal  Neck: Supple, symmetrical, trachea midline, no adenopathy;   thyroid:  no enlargement/tenderness/nodules    Back: Symmetric, no curvature, ROM normal, no CVA tenderness  Lungs: Clear to auscultation bilaterally, respirations unlabored  Chest Wall: No tenderness or deformity  Heart: Regular rate and rhythm, S1 and S2 normal, no murmur, rub or gallop  Breast Exam: ***  Abdomen: Soft, non-tender, no masses, no organomegaly  Extremities: Extremities normal, atraumatic, no cyanosis or edema  Skin:  Skin color, texture, turgor normal, no rashes or lesions  Lymph Nodes:  Cervical, supraclavicular, and axillary nodes normal  Neurologic: CNII-XII intact, normal strength, sensation and reflexes throughout    Labs Results:  Reviewed in epic today  Lab Results   Component Value Date    WBC 7.29 05/02/2023    HGB 14.4 05/02/2023    HCT 45.1 (H) 05/02/2023    MCV 93.0 05/02/2023    PLT 265 05/02/2023     Lab Results   Component Value Date    CREAT 0.8 05/02/2023    BUN 10 05/02/2023    NA 138 05/02/2023    K 4.1 05/02/2023    CL 103 05/02/2023    CO2 26 05/02/2023  Lab Results   Component Value Date    ALT 15 05/02/2023    AST 15 05/02/2023    ALKPHOS 77 05/02/2023    BILITOTAL 0.5 05/02/2023       Imaging Results:   Reviewed:   Mammogram, Ultrasound, MRI  breast   DXA   CT CAP   B/S     Malignant neoplasm of left breast in female, estrogen receptor positive  68 yo female with invasive ductal and lobular carcinoma left breast, stage IA, pT1bN0, G2, ER>90%, PR 70%, HER2 IHC 0, Ki-67 28%    S/p left lumpectomy and sentinel node biopsy on 08/06/2023    Oncotype 21 (7 % risk of systemic recurrence after 5 years of endocrine therapy)     No orders of the defined types were placed in this encounter.    Ms. Hellard is a 68 year old woman with newly diagnosed LEFT breast cancer s/p mammogram, MRI and biopsy on 06/06/23 revealing cT1b (0.9 cm on MRI) N0 (no IM or axillary nodes on MRI), grade 2, ER+/PR+/HER2- IDC/ILC who presents today for initial medical oncology consultation after lumpectomy on 08/06/23 which revealed final pathology of pT1b pN0 cM0. Her pathology was reviewed and the significance of her receptor status and clinical staging (Stage I) were discussed. The role of adjuvant radiation for local control and adjuvant endocrine therapy for prevention of distance recurrence were discussed     It was discussed with patient that based upon her low oncotype score and post-menopausal status that there is no additional benefit of adjuvant chemotherapy in addition to adjuvant endocrine therapy. Given ER/PR + tumor she would benefit from at least 5-10 years of adjuvant endocrine therapy. Options including aromatase inhibitor and Tamoxifen were discussed including respective unique side effects of decreased bone density for aromatase inhibitor and small increase in risk of uterine cancer and venous thromboembolism with Tamoxifen. Additionally explained that either can be accompanied by post-menopausal side effects including hot flashes, other vasomotor symptoms, joint aches/pains, vaginal dryness, and mood disturbance.     Plan:   -Radiation per Dr. Orma Mueller  -Start Letrozole 2.5 mg daily X 5 years following RT therapy   -Obtain baseline CBC, CMP, and Vit D     Bone Density  Monitoring and Osteopenia Prevention  -Repeat DEXA now  -Start Vit D3 2000 units/day + dietary calcium rich foods   -Encouraged weight bearing exercises     RTC in 4 months with Dr. Gaspar Cola, MD PGY-4  Hematology/Oncology Fellow   [facility]     Note is not official without attestation and signature of attending                 [1]   Past Medical History:  Diagnosis Date    Abnormal vision     glasses    Anxiety     Not on meds    Arthritis     bilateral knees/OA hips/back    Carpal tunnel syndrome on both sides     stiffness noted     Chronic obstructive pulmonary disease     Uses inhalers as prescribed    Difficulty walking     limps    Eczema     has prescription cream    Fracture     finger. resolved    Gastroesophageal reflux disease     heartburn recently diet related/some problem swallowing    History of gonorrhea     teenager  Hx of Hepatitis C     s/p treatment    Hyperlipidemia     Hypertensive disorder     stable meds    Lower back pain     sciatica radiates down LLE: Pain range= 0-10    Malignant neoplasm of left female breast, unspecified estrogen receptor status, unspecified site of breast     Pre-op dx    Neck pain     related to lower back but significant at this time     Neuropathy     numbness upon awakening from low back radiating down LLE .Marland Kitchen. 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 fixed surgically   [2]   Past Surgical History:  Procedure Laterality Date    ARTHROPLASTY, KNEE, TOTAL Left 12/14/2015    Procedure: ARTHROPLASTY, KNEE, TOTAL;  Surgeon: Cynda Familia, MD;  Location: [facility];  Service: Orthopedics;  Laterality: Left;  LEFT TOTAL KNEE ARTHROPLASTY    BIOPSY, BREAST, SENTINEL NODE Left 08/06/2023    Procedure: BIOPSY, BREAST, SENTINEL NODE;  Surgeon: Kathryn Burrow, MD;  Location: [facility];  Service: General;  Laterality: Left;    BREAST, LUMPECTOMY Left 08/06/2023    Procedure: LEFT BREAST LUMPECTOMY; LEFT BREAST WIRE LOC; LEFT BREAST 2ND LESION WIRE LOC; LEFT BREAST SENTINEL LYMPH NODE AND  EXCISION AND MAPPING IN OR BY MD;  Surgeon: Kathryn Burrow, MD;  Location: [facility];  Service: General;  Laterality: Left;    COLONOSCOPY, DIAGNOSTIC (SCREENING)  2016    X1 polyp    ENDOSCOPIC CARPAL TUNNEL RELEASE Right 03/02/2017    Procedure: ENDOSCOPIC CARPAL TUNNEL RELEASE;  Surgeon: Virgilio Frees., MD;  Location: [facility];  Service: Orthopedics;  Laterality: Right;    GANGLION CYST EXCISION Right 2007    x2    INDUCED ABORTION      KNEE ARTHROSCOPY Right 09/24/2014    KNEE ARTHROSCOPY Left 03/2015    LYMPHANGIOGRAM/LYMPHANGIOGRAPHY Left 08/06/2023    Procedure: LYMPHANGIOGRAM/LYMPHANGIOGRAPHY;  Surgeon: Kathryn Burrow, MD;  Location: [facility];  Service: General;  Laterality: Left;    MAMMOPLASTY, REDUCTION, BREAST HYPERTROPHY (MEDICAL) Left 08/06/2023    Procedure: MAMMOPLASTY, REDUCTION, BREAST HYPERTROPHY (MEDICAL);  Surgeon: Lesle Reek, MD;  Location: [facility];  Service: Plastics;  Laterality: Left;    PLACEMENT, PERCUTANEOUS, DEVICE, BREAST LOCALIZATION WITH ULTRASOUND GUIDANCE Left 08/06/2023    Procedure: PLACEMENT, PERCUTANEOUS, DEVICE, BREAST LOCALIZATION WITH ULTRASOUND GUIDANCE;  Surgeon: Kathryn Burrow, MD;  [facility];  Service: General;  Laterality: Left;    REPAIR, UPPER EXTREMITY TENDON Right 03/02/2017    Procedure: REPAIR, UPPER EXTREMITY TENDON, EXCISION, GANGLION CYST;  Surgeon: Virgilio Frees., MD;  Location: [facility];  Service: Orthopedics;  Laterality: Right;  right wrist flexor carpi radialis tenosynovectomy w/ possible carpal tunnel release and soft tissue mass excision  q1-unk, asst=n, equip=needle tip bovie, beaver blade, lead hand, micro aire, md req 90 min/ok per JD     REVISION, SCAR LEVEL 1 (MEDICAL) Left 08/06/2023    Procedure: REVISION, SCAR LEVEL1 (MEDICAL);  Surgeon: Lesle Reek, MD;  Location: [facility];  Service: Plastics;  Laterality: Left;  left axilla closure    TONSILLECTOMY  TRANSFER/REARRANGEMENT, TISSUE, ADJACENT, TRUNK Left 08/06/2023    Procedure: TRANSFER/REARRANGEMENT, TISSUE, ADJACENT, TRUNK/BREAST;  Surgeon: Lesle Reek, MD;  Location: [facility];  Service: Plastics;  Laterality: Left;    VAGINAL DELIVERY      X2 spinals ( 6 natural deliveries)   [3]   Allergies  Allergen Reactions    Latex Rash

## 2023-09-21 ENCOUNTER — Telehealth: Payer: Self-pay | Admitting: Internal Medicine

## 2023-09-21 ENCOUNTER — Encounter: Payer: Self-pay | Admitting: Internal Medicine

## 2023-09-21 ENCOUNTER — Inpatient Hospital Stay
Admission: RE | Admit: 2023-09-21 | Payer: Self-pay | Source: Ambulatory Visit | Admitting: Student in an Organized Health Care Education/Training Program

## 2023-09-21 ENCOUNTER — Other Ambulatory Visit: Payer: Self-pay | Admitting: Internal Medicine

## 2023-09-21 ENCOUNTER — Ambulatory Visit: Payer: No Typology Code available for payment source | Attending: Internal Medicine | Admitting: Internal Medicine

## 2023-09-21 ENCOUNTER — Encounter: Payer: Self-pay | Admitting: Student in an Organized Health Care Education/Training Program

## 2023-09-21 VITALS — BP 136/85 | HR 108 | Temp 98.2°F | Resp 20 | Ht 60.93 in | Wt 199.2 lb

## 2023-09-21 DIAGNOSIS — Z719 Counseling, unspecified: Secondary | ICD-10-CM | POA: Insufficient documentation

## 2023-09-21 DIAGNOSIS — Z170 Estrogen receptor positive status [ER+]: Secondary | ICD-10-CM | POA: Insufficient documentation

## 2023-09-21 DIAGNOSIS — Z5181 Encounter for therapeutic drug level monitoring: Secondary | ICD-10-CM

## 2023-09-21 DIAGNOSIS — C50912 Malignant neoplasm of unspecified site of left female breast: Secondary | ICD-10-CM | POA: Insufficient documentation

## 2023-09-21 DIAGNOSIS — C50512 Malignant neoplasm of lower-outer quadrant of left female breast: Secondary | ICD-10-CM

## 2023-09-21 DIAGNOSIS — M170 Bilateral primary osteoarthritis of knee: Secondary | ICD-10-CM | POA: Insufficient documentation

## 2023-09-21 MED ORDER — LETROZOLE 2.5 MG PO TABS
2.5000 mg | ORAL_TABLET | Freq: Every day | ORAL | 5 refills | Status: DC
Start: 2023-09-21 — End: 2023-09-21

## 2023-09-21 NOTE — Telephone Encounter (Signed)
--  Pt stated she will return on 09/22/23 in the AM--1. CBC, CMP, D today     --09/27/23 @ 11:15am at [facility] [address]--2. Baseline DXA within 4 m  [address], [place], [place] [address]  Ph: [phone], opt 4  Prep: -Do not have imaging with dye or contrast 7 days prior  -24 hrs prior to the exam no calcium supplements, multivitamins, or vitamin D, or antacids with calcium (tums)    --01/22/24 @ 10:20am-RTC 4 m

## 2023-09-21 NOTE — Telephone Encounter (Signed)
Following the OV with Dr. Ladell Heads on 09/21/2023, Pt needs to be scheduled for the following appts:    CBC, CMP, D today   Baseline DXA within 4 m   RTC 4 m     Please follow up with the Pt for scheduling.    Thank you,  Zollie Scale, BSN, RN

## 2023-09-21 NOTE — Telephone Encounter (Signed)
CBC, CMP, D today   Baseline DXA within 4 m     RTC 4 m

## 2023-09-21 NOTE — Progress Notes (Unsigned)
09/21/23 1209   Radiation Therapy Patient Care - Breast    Site L breast   Comfort Alteration   Karnofsky Performance Score 100%-normal, no complaints   Fatigue 1   Pain Score 2   Pain Loc BREAST   Nutrition Alteration   Weight 90.4 kg (199 lb 6.4 oz)   Skin Alteration   Radiation Dermatitis 0-none   Emotional Alteration   Coping 0-effective   Vital signs   Temp 97.9 F (36.6 C)   Temp src Temporal   Heart Rate (!) 106   Resp Rate 18   BP 119/85     Berdie Ogren, RN

## 2023-09-21 NOTE — Progress Notes (Incomplete)
RADIATION ONCOLOGY CONSULTATION     Patient Name: [name]  Date of Birth: 08/24/1956  Medical Record Number: [id number]  Encounter Date: 09/21/2023  Radiation Oncology Physician: Lyman Speller, MD    Consulting Physicians and Providers:  Referring physician or provider: Dr. Lindi Adie  Primary care physician or provider: Roselie Skinner, MD  Breast surgical oncology: Dr. Lindi Adie  Plastic surgeon: Romero Liner, MD    DIAGNOSIS:     Left breast cancer      ASSESSMENT AND PLAN      68 year old woman with newly diagnosed LEFT breast cancer s/p mammogram, MRI and biopsy on 06/06/23 revealing cT1b (0.9 cm on MRI) N0 (no IM or axillary nodes on MRI), grade 2, ER+/PR+/HER2- IDC/ILC.  She is awaiting the results of her genetic testing but has met with Dr.Cocilovo and is planning to undergo a lumpectomy + SLNB (surgery date TBD).      We discussed that early stage breast cancer can be treated with mastectomy or lumpectomy followed by RT. We discussed that there is no difference in survival between these two treatment techniques. The patient prefers to preserve her breast and would like to proceed with lumpectomy.      We discussed that adjuvant RT can be given using multiple methods.  Depending on her pathology as well as whether she gets an oncoplasty, we can consider giving her APBI in 5 fx or whole breast RT over 3-4 weeks.  The patient may get an oncoplasty and understands that APBI is typically not recommended after oncoplasty.     We reviewed the process of simulation and the process of treatment.  Patients treated with breast irradiation typically develop erythema and/or hyperpigmentation and fatigue during the treatment.  Patients may experience mild breast pain, swelling or a feeling of warmth. Most patients experience resolution of these side effects within a few months of completing treatment.  However, some patients are left with hyperpigmentation and/or erythema, or experience persistent breast pain.   Changes in the breast contour, skin thickening, and some swelling or shrinkage of the breast can occur.  More significant changes like fibrosis and telangiectasias are less common.  We discussed the low risk of radiation pneumonitis, and secondary malignancy. We discussed the low risk of cardiac toxicity.      She agreed to proceed with RT if she undergoes lumpectomy. She understands that an Oncotype test will be done to determine her need for chemotherapy.  She will schedule a follow up with me after her surgery.    HISTORY OF PRESENTATION:     Patient has a significant family history of breast cancer and has undergone genetic testing, which is pending.    -05/04/2023: Screening mammogram revealed left breast focal asymmetry and question distortion for which further evaluation was recommended.        -05/14/2023: Diagnostic mammogram and US revealed a left breast 2:30 mass and a left breast 2:00 mass. Biopsy recommended. There is a persistent partially obscured mass in the upper outer quadrant of the  LEFT breast with a few associated punctate calcifications.     US showed that at 2:30, there is a hypoechoic mass measuring 0.6 cm corresponding to calcifications. At 2:00, there is a second mas measuring 0.6 cm.   At 3:00, there is a benign cyst.       [place] of LEFT axilla showed benign appearing lymph nodes.         -06/06/2023: Biopsy of the left breast masses performed. Pathology  revealed:                 -Left breast 2:30: usual ductal hyperplasia, columnar cell change and cysts, in a background of dense stromal fibrosis and PASH.                 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 No MRI evidence for malignancy  in the contralateral right breast. No axillary lymphadenopathy or internal mammary chain nodes.                -07/04/2023: Scheduled for plastics consult.  -08/06/2023:   Patient underwent LEFT lumpectomy and SLNB. Veraform was placed.  Oncoplasty was done for reconstruction,.    Pathology showed LEFT breast IDC/ILC, moderately differentiated at 0.8 cm, grade 2, LVSI-, benign mammary tissue  with microcysts at 3:00, margins negative (> 1 cm), DCIS not present at margins, skin not involved.  pT1bN0 ER+ >90%, PR+ 70%, HER2-.  Oncotype 21.    Genetic testing is negative.     4240 cGy in 16 fx to entire left breast.      Encounter type: In-person  Interpreter required: No interpreter was required for this visit  [name] presents today alone.    Range of motion issues: No  Extremity edema: No  Any issues healing after surgery:  NA  Surgical scar dehiscence:  NA  New mass or adenopathy noted in breast or lymph nodes:  NA  Previous breast biopsy or surgery: No  Family history of breast cancer or ovarian cancer: Yes - sister, maternal cousin and maternal great aunt  Genetic testing: Yes - pending; collected on 06/21/23    Gynecologic history:  Age of menarche: 69  Age of menopause: 57  Last menstrual period: 61  Pregnancy history: G7P6  Age of first childbrith: 68 years old  Exposure to hormone replacement therapy: No    SYSTEMIC THERAPY:     None currently    RADIATION RISK FACTORS AND HISTORY:     Prior radiotherapy: No  Prior systemic therapy: No  Cardiovascular implantable electronic device (CIED): None  Non-cardiac electronic device: No  Collagen vascular disease: No  Inflammatory bowel disease: No  Pregnancy status: Patient states she is post-menopausal and declines pregnancy test if radiotherapy is indicated  Prior reaction to IV contrast for imaging: No known prior allergy or reaction to either iodine or gadolinium contrast  Prior issues obtaining MRI: None    REVIEW OF SYSTEMS:     Fevers:  No  Unintentional weight loss: No  Pain: No    Fall risk  -Have you fallen two or more times in the past year: No  -Did you suffer any injuries from your falls in the past year: No    A complete 14 point review of systems was performed and was negative with the exception of pertinent positives detailed here and in the HPI.    Review of Systems   All other systems reviewed and are negative.      VITALS/PHYSICAL EXAM:     Vital signs reviewed in EPIC, including:  There were no vitals filed for this visit.    Wt Readings from Last 3 Encounters:  09/21/23 90.4 kg (199 lb 6.4 oz)   09/21/23 90.4 kg (199 lb 3.2 oz)   08/21/23 92.5 kg (204 lb)     Pain: 0 out of 10  KPS: 100 - Normal, no complaints    General: Well-appearing, pleasant female, in no acute distress.  Psychiatric: Alert and oriented x3, thought content appropriate, euthymic.  Head: Normocephalic, without obvious abnormality.  Eyes: Conjunctivae and sclerae clear without icterus. Extraocular movements intact.  Lungs: Work of breathing appears unlabored.  Skin: Skin color appears normal.   Breast: No palpable masses in bilateral breasts or axilla                     Lyman Speller, MD        Attending Radiation Oncologist        Department of Advanced Radiation Oncology & Proton Therapy        [facility]        [address]        [place], [place] [address]             T [phone]  F [phone]        [email or web address]

## 2023-09-22 ENCOUNTER — Other Ambulatory Visit (INDEPENDENT_AMBULATORY_CARE_PROVIDER_SITE_OTHER): Payer: No Typology Code available for payment source

## 2023-09-22 DIAGNOSIS — Z170 Estrogen receptor positive status [ER+]: Secondary | ICD-10-CM

## 2023-09-22 DIAGNOSIS — Z79811 Long term (current) use of aromatase inhibitors: Secondary | ICD-10-CM

## 2023-09-22 DIAGNOSIS — Z5181 Encounter for therapeutic drug level monitoring: Secondary | ICD-10-CM

## 2023-09-22 DIAGNOSIS — C50912 Malignant neoplasm of unspecified site of left female breast: Secondary | ICD-10-CM

## 2023-09-22 LAB — LAB USE ONLY - CBC WITH DIFFERENTIAL
Absolute Basophils: 0.03 10*3/uL (ref 0.00–0.08)
Absolute Eosinophils: 0.21 10*3/uL (ref 0.00–0.44)
Absolute Immature Granulocytes: 0.01 10*3/uL (ref 0.00–0.07)
Absolute Lymphocytes: 2.35 10*3/uL (ref 0.42–3.22)
Absolute Monocytes: 0.61 10*3/uL (ref 0.21–0.85)
Absolute Neutrophils: 3.77 10*3/uL (ref 1.10–6.33)
Absolute nRBC: 0 10*3/uL (ref <=0.00)
Basophils %: 0.4 %
Eosinophils %: 3 %
Hematocrit: 41.5 % (ref 34.7–43.7)
Hemoglobin: 13.6 g/dL (ref 11.4–14.8)
Immature Granulocytes %: 0.1 %
Lymphocytes %: 33.7 %
MCH: 31.1 pg (ref 25.1–33.5)
MCHC: 32.8 g/dL (ref 31.5–35.8)
MCV: 94.7 fL (ref 78.0–96.0)
MPV: 10.3 fL (ref 8.9–12.5)
Monocytes %: 8.7 %
Neutrophils %: 54.1 %
Platelet Count: 269 10*3/uL (ref 142–346)
Preliminary Absolute Neutrophil Count: 3.77 10*3/uL (ref 1.10–6.33)
RBC: 4.38 10*6/uL (ref 3.90–5.10)
RDW: 13 % (ref 11–15)
WBC: 6.98 10*3/uL (ref 3.10–9.50)
nRBC %: 0 /100{WBCs} (ref <=0.0)

## 2023-09-22 LAB — COMPREHENSIVE METABOLIC PANEL
ALT: 14 U/L (ref <=55)
AST (SGOT): 17 U/L (ref <=41)
Albumin/Globulin Ratio: 1 (ref 0.9–2.2)
Albumin: 3.8 g/dL (ref 3.5–5.0)
Alkaline Phosphatase: 71 U/L (ref 37–117)
Anion Gap: 7 (ref 5.0–15.0)
BUN: 17 mg/dL (ref 7–21)
Bilirubin, Total: 0.4 mg/dL (ref 0.2–1.2)
CO2: 28 meq/L (ref 17–29)
Calcium: 10 mg/dL (ref 8.5–10.5)
Chloride: 106 meq/L (ref 99–111)
Creatinine: 0.9 mg/dL (ref 0.4–1.0)
GFR: >60 mL/min/{1.73_m2} (ref >=60.0)
Globulin: 3.7 g/dL — ABNORMAL HIGH (ref 2.0–3.6)
Glucose: 102 mg/dL — ABNORMAL HIGH (ref 70–100)
Hemolysis Index: 7 {index}
Potassium: 4.3 meq/L (ref 3.5–5.3)
Protein, Total: 7.5 g/dL (ref 6.0–8.3)
Sodium: 141 meq/L (ref 135–145)

## 2023-09-22 LAB — VITAMIN D, 25 OH, TOTAL: Vitamin D 25-OH, Total: 30 ng/mL (ref 30–100)

## 2023-09-26 ENCOUNTER — Encounter (INDEPENDENT_AMBULATORY_CARE_PROVIDER_SITE_OTHER): Payer: Self-pay

## 2023-09-27 ENCOUNTER — Other Ambulatory Visit: Payer: Self-pay | Admitting: Internal Medicine

## 2023-10-09 NOTE — Progress Notes (Unsigned)
 RADIATION ONCOLOGY ON-TREATMENT VISIT NURSING INTAKE    Patient Name: [name]  Date of Birth: 1955/12/01  Medical Record Number: [id number]  Encounter Date: 10/15/2023  Primary Radiation Oncologist: Lauraine DOROTHA Jock, MD    Consulting Physicians and Providers:  Referring physician or provider: Dr. Roselind  Primary care physician or provider: Crisoforo Fuller, MD  Breast surgical oncology: Dr. Roselind  Plastic surgeon: Lindley Anes, MD    DIAGNOSIS:     [name] is a 68 y.o. female with L breast cancer    RADIOTHERAPY TREATMENT:     Intent: Curative  Modality: Photons    Treatment Site Total dose prescribed  Total fractions  prescribed Fractional (daily) dose   L breast    4240 cGy 16 265 cGy   Prescribed sum dose: 4240 cGy    ***Add graphic plan snip showing axial, coronal, sagittal view from Treatment Plan PDF in [employer]    Current dose delivered: 265 cGy  Current fractions delivered: 1    SYSTEMIC THERAPY:     {RadOnc Systemic Therapy NUR:45268}    SUBJECTIVE:     Interpreter required: {RadOnc Interpreter:54665}    Week 1: ***    OBJECTIVE:     Vitals  There were no vitals filed for this visit.  Weight  Current weight:    Wt Readings from Last 10 Encounters:   09/21/23 90.4 kg (199 lb 6.4 oz)   09/21/23 90.4 kg (199 lb 3.2 oz)   08/21/23 92.5 kg (204 lb)   08/06/23 90.7 kg (200 lb)   07/26/23 90.7 kg (200 lb)   07/03/23 91.5 kg (201 lb 12.8 oz)   06/21/23 90.7 kg (200 lb)   05/02/23 91.8 kg (202 lb 6.4 oz)   12/16/22 90.7 kg (200 lb)   11/08/22 92.7 kg (204 lb 6.4 oz)       Pain: {RadOnc Pain Scale:54155} out of 10  KPS: {RadOnc KPS Selections:54154}    TOXICITY:     General/Constitutional:  Fatigue: {RadOnc Fatigue Toxicity:54582::0- None}    Breast:  Breast/chest wall pain: {RadOnc Breast Pain Toxicity:54543::0- None}  Breast/chest wall edema: {RadOnc Toxicity General Selections:54677}  Breast/chest wall folliculitis: {RadOnc Toxicity General Selections:54677}    Skin:  Radiation dermatitis:  {RadOnc Derm Radiation Toxicity:54567::0- None}    Extremity:  Limb edema: {RadOnc Limb Edema Toxicity:54595::0- None}  Joint range of motion decreased: {RadOnc Joint Range of Motion Decreased Toxicity:54592::0- None}    Chemo/Hormone therapy related:  Hot flashes: {RadOnc Hot Flashes Toxicity:54590::0- None}    Treatment break of at least 3 consecutive days:  -{RadOnc Yes/No Selections:54157::No}    RECOMMENDATIONS:     {RadOnc Intervention NUR:44562}    Damien Mason, RN

## 2023-10-11 ENCOUNTER — Encounter: Payer: Self-pay | Admitting: Student in an Organized Health Care Education/Training Program

## 2023-10-11 ENCOUNTER — Other Ambulatory Visit: Payer: Self-pay | Admitting: Registered Nurse

## 2023-10-12 ENCOUNTER — Ambulatory Visit: Payer: Self-pay

## 2023-10-12 ENCOUNTER — Inpatient Hospital Stay
Admission: RE | Admit: 2023-10-12 | Payer: Self-pay | Source: Ambulatory Visit | Admitting: Student in an Organized Health Care Education/Training Program

## 2023-10-15 ENCOUNTER — Ambulatory Visit: Payer: Self-pay

## 2023-10-15 ENCOUNTER — Inpatient Hospital Stay
Admission: RE | Admit: 2023-10-15 | Payer: Self-pay | Source: Ambulatory Visit | Admitting: Student in an Organized Health Care Education/Training Program

## 2023-10-16 ENCOUNTER — Ambulatory Visit: Payer: Self-pay

## 2023-10-17 ENCOUNTER — Ambulatory Visit: Payer: Self-pay

## 2023-10-17 NOTE — Progress Notes (Signed)
 SLEEP MEDICINE OFFICE FOLLOW-UP NOTE    HRT [facility] OFFICE  [place]  HEART [facility] OFFICE -CARDIOLOGY  [address]  [place] [address]  Dept: [phone]  Dept Fax: [phone]         Patient Name: [name]    Date of Visit:  October 18, 2023  Date of Birth: [birth year]  AGE: 68 y.o.  Medical Record #: [id number]  Requesting Physician: Rosalynd Star, MD    CHIEF COMPLAINT:  Annual follow-up      HISTORY OF PRESENT ILLNESS    [name] is a pleasant 68 y.o. female with history of hypertension whom I was able to see today for overdue sleep medicine follow-up.    06/02/2022 HST: AHI 34/RDI 35/average O2 91%/low O2 67%/time below 90% = 83 minutes    Unfortunately she did not meet compliance and [employer] has sent her a bill for the remaining balance of the machine, which she has not yet paid.  Barrier to compliance: Discomfort with traditional fullface mask.  She did not request a mask refitting.    The last 8 months has been very difficult for her including the death of her father, managing his estate, and personal diagnosis of breast cancer.    ESS 10.  Denies drowsy driving.          PAST MEDICAL HISTORY: She has a past medical history of Abnormal vision, Anxiety, Arthritis, Carpal tunnel syndrome on both sides, Chronic obstructive pulmonary disease, Difficulty walking, Eczema, Fracture, Gastroesophageal reflux disease, History of gonorrhea, Hx of Hepatitis C, Hyperlipidemia, Hypertensive disorder, Lower back pain, Malignant neoplasm of left female breast, unspecified estrogen receptor status, unspecified site of breast, Neck pain, Neuropathy, Pneumonia, Sleep apnea, and Vein disorder. She has a past surgical history that includes Ganglion cyst excision (Right, 2007); Knee arthroscopy (Right, 09/24/2014); Knee arthroscopy (Left, 03/2015); COLONOSCOPY, DIAGNOSTIC (SCREENING) (2016); Induced abortion; Vaginal delivery; ARTHROPLASTY, KNEE, TOTAL (Left, 12/14/2015); Tonsillectomy;  REPAIR, UPPER EXTEMITY TENDON (Right, 03/02/2017); ENDOSCOPIC CARPAL TUNNEL RELEASE (Right, 03/02/2017); BREAST, LUMPECTOMY (Left, 08/06/2023); PLACEMENT, PERCUTANEOUS, DEVICE, BREAST LOCALIZATION WITH ULTRASOUND GUIDANCE (Left, 08/06/2023); BIOPSY, BREAST, SENTINEL NODE (Left, 08/06/2023); LYMPHANGIOGRAM/LYMPHANGIOGRAPHY (Left, 08/06/2023); TRANSFER/REARRANGEMENT, TISSUE, ADJACENT, TRUNK (Left, 08/06/2023); MAMMOPLASTY, REDUCTION, BREAST HYPERTROPHY (MEDICAL) (Left, 08/06/2023); and REVISION, SCAR LEVEL 1 (MEDICAL) (Left, 08/06/2023).    ALLERGIES:   Allergies   Allergen Reactions    Latex Rash       MEDICATIONS:   Patient's current medications were reviewed. ONLY Sleep Medicine related medications were updated unless others were addressed in assessment and plan.  Current Outpatient Medications   Medication Instructions    albuterol  sulfate HFA (PROVENTIL ) 108 (90 Base) MCG/ACT inhaler 2 puffs, Inhalation, Every 4 hours PRN, Dispense with spacer    atorvastatin  (LIPITOR) 20 mg, Oral, Daily    betamethasone  dipropionate (DEL-BETA) 0.05 % cream 1 .application, As needed    ELDERBERRY PO 3,200 mg    fluticasone-umeclidinium-vilanterol (Trelegy Ellipta ) 100-62.5-25 MCG/ACT Aerosol Pwdr, Breath Activated 1 puff, Inhalation, Daily    letrozole  (FEMARA ) 2.5 MG tablet TAKE 1 TABLET BY MOUTH DAILY    losartan -hydrochlorothiazide  (HYZAAR) 100-12.5 MG per tablet 1 tablet, Oral, Daily    Magnesium 250 mg    Nebulizer Misc Dispense one nebulizer machine to include mask and tubing    vitamin D  (cholecalciferol ) 25 MCG (1000 UT) tablet 1 tablet    Vitamin E 670 MG (1000 UT) Cap 1 capsule       FAMILY HISTORY: family history includes Arthritis in her father; Breast cancer in her  sister and other family members; Breast cancer (age of onset: 9) in her paternal cousin; Breast cancer (age of onset: 69) in her paternal cousin; Heart disease in her maternal grandmother; Hypertension in her father and mother; Kidney disease in her  brother; Prostate cancer in her paternal grandfather; Stroke in her mother.      SOCIAL HISTORY: She reports that she has been smoking cigarettes. She has a 7.5 pack-year smoking history. She has never used smokeless tobacco. She reports current drug use. She reports that she does not drink alcohol.    REVIEW OF SYSTEMS:    No data recorded      PHYSICAL EXAMINATION    Visit Vitals  BP 120/66 (BP Site: Right arm, Patient Position: Sitting, Cuff Size: Large)   Pulse (!) 109   Ht 1.524 m (5')   Wt 88.5 kg (195 lb)   SpO2 94%   BMI 38.08 kg/m          Constitutional: Cooperative, alert and oriented,well developed, well nourished, in no acute distress.,   Head: Normocephalic, normal hair pattern  Eyes: conjunctivae and lids normal   Chest: Normal respiration without extremus   Neurological: No gross motor or sensory deficits noted  Psych: affect appropriate, oriented to time, person and place.    LABS:   Lab Results   Component Value Date    WBC 6.98 09/22/2023    HGB 13.6 09/22/2023    HCT 41.5 09/22/2023    PLT 269 09/22/2023     No results found for: IRON, TIBC, FERRITIN  Lab Results   Component Value Date    TSH 0.87 05/02/2023    HGBA1C 5.8 (H) 05/02/2023    BNP 77 04/07/2022       ASSESSMENT and PLAN:    Severe obstructive Sleep Apnea/nocturnal hypoxemia:  06/02/2022 HST: AHI 34/RDI 35/average O2 91%/low O2 67%/time below 90% = 83 minutes  Setup with [employer], initiated on 10/23/2022  ResMed AS10 + traditional fullface mask  Noncompliant and [employer] sent a bill for the remaining balance of the machine.  Advised that she will not be eligible for a mask refitting or supplies until she can settle her agreement with [employer]. She should call [employer] to see if they will allow her to return the machine.  Provided donated refurbished DS 1 machine set to APAP 4-10, standard tubing, and Solo nasal mask as she is unlikely to be able to restart PAP therapy with current machine.  Demonstrated how to adjust  comfort settings.   Understands she will need to repeat her sleep study and show compliance in order to restart PAP therapy.  The patient was reminded not to drive or operate heavy equipment if drowsy and how body position during sleep, weight changes, alcohol, and certain sedative medications can affect sleep apnea.   In the mean time, head of bed elevation / side sleeping position was recommended.  A regular sleep-wake cycle with 7-8 hours of sleep per night along with good sleep hygiene were recommended.  Emphasized the dangers of sustained hypoxemia and strongly advised she reach out for help if having trouble acclimating to PAP therapy.  Risks of untreated or suboptimally treated sleep apnea including but not limited to  MI, TIA/CVA, dementia, metabolic and endocrine disturbances, excessive daytime sleepiness.      Hypertension:  We discussed the relationship between untreated OSA and cardiovascular processes such as hypertension, dysrhythmias, stroke, CAD and the like and how treating moderate to severe OSA could help  reduce future risks for occurrence  1. Stable and following with our Cardiology team  2. I have reviewed their cardiology notes  3.  06/20/2022 echocardiogram: EF 63%    Weight:  We discussed the relationship of increased weight as it relates to obstruction sleep apnea and how maintenance of ideal body weight could likely improve sleep apnea severity.    Reviewed prior testing, PAP notes, cardiology notes, echocardiogram, set up APAP and demonstrated how to adjust comfort settings.  Combination sleep apnea hypertension can increase cardiovascular risk.                                                 Orders Placed This Encounter   Procedures    Sleep APP Visit ([facility])     F/u 1 month for comfort and compliance.    No orders of the defined types were placed in this encounter.        SIGNED:    Alyssandra Hulsebus E Cecille Mcclusky, NP     In total, 50 minutes were spent in the care and management of this patient today  including extensive education, relevant documentation, and chart review.       Please pardon any potential grammatical errors or typos as aspects of this note may have been created through speech-to-text software.

## 2023-10-18 ENCOUNTER — Ambulatory Visit: Payer: Self-pay

## 2023-10-18 ENCOUNTER — Ambulatory Visit (INDEPENDENT_AMBULATORY_CARE_PROVIDER_SITE_OTHER): Payer: No Typology Code available for payment source | Admitting: Adult Health

## 2023-10-18 ENCOUNTER — Encounter (INDEPENDENT_AMBULATORY_CARE_PROVIDER_SITE_OTHER): Payer: Self-pay | Admitting: Adult Health

## 2023-10-18 VITALS — BP 120/66 | HR 109 | Ht 60.0 in | Wt 195.0 lb

## 2023-10-18 DIAGNOSIS — G4733 Obstructive sleep apnea (adult) (pediatric): Secondary | ICD-10-CM

## 2023-10-18 DIAGNOSIS — G4734 Idiopathic sleep related nonobstructive alveolar hypoventilation: Secondary | ICD-10-CM

## 2023-10-18 DIAGNOSIS — I10 Essential (primary) hypertension: Secondary | ICD-10-CM

## 2023-10-18 DIAGNOSIS — Z6838 Body mass index (BMI) 38.0-38.9, adult: Secondary | ICD-10-CM

## 2023-10-19 ENCOUNTER — Ambulatory Visit: Payer: Self-pay

## 2023-10-20 ENCOUNTER — Ambulatory Visit
Admission: RE | Admit: 2023-10-20 | Discharge: 2023-10-20 | Disposition: A | Discharge status: Home / Self Care | Payer: No Typology Code available for payment source | Source: Ambulatory Visit | Attending: Surgery | Admitting: Surgery

## 2023-10-20 DIAGNOSIS — Z1732 Human epidermal growth factor receptor 2 negative status: Secondary | ICD-10-CM | POA: Insufficient documentation

## 2023-10-20 DIAGNOSIS — Z1721 Progesterone receptor positive status: Secondary | ICD-10-CM | POA: Insufficient documentation

## 2023-10-20 DIAGNOSIS — C50412 Malignant neoplasm of upper-outer quadrant of left female breast: Secondary | ICD-10-CM | POA: Insufficient documentation

## 2023-10-20 DIAGNOSIS — Z170 Estrogen receptor positive status [ER+]: Secondary | ICD-10-CM | POA: Insufficient documentation

## 2023-10-20 DIAGNOSIS — Z510 Encounter for antineoplastic radiation therapy: Secondary | ICD-10-CM | POA: Insufficient documentation

## 2023-10-22 ENCOUNTER — Ambulatory Visit: Payer: Self-pay

## 2023-10-22 ENCOUNTER — Encounter: Payer: Self-pay | Admitting: Student in an Organized Health Care Education/Training Program

## 2023-10-23 ENCOUNTER — Ambulatory Visit: Payer: Self-pay

## 2023-10-23 NOTE — Progress Notes (Signed)
 RADIATION ONCOLOGY ON-TREATMENT VISIT NURSING INTAKE    Patient Name: [name]  Date of Birth: 1955/11/22  Medical Record Number: [id number]  Encounter Date: 10/29/2023  Primary Radiation Oncologist: Lauraine DOROTHA Jock, MD    Consulting Physicians and Providers:  Referring physician or provider: Dr. Roselind  Primary care physician or provider: Crisoforo Fuller, MD  Breast surgical oncology: Dr. Roselind  Plastic surgeon: Lindley Anes, MD    DIAGNOSIS:     [name] is a 68 y.o. female with L breast cancer    RADIOTHERAPY TREATMENT:     Intent: Curative  Modality: Photons    Treatment Site Total dose prescribed  Total fractions  prescribed Fractional (daily) dose   L breast    4240 cGy 16 265 cGy   Prescribed sum dose: 4240 cGy        Current dose delivered: 795 cGy  Current fractions delivered: 3    SYSTEMIC THERAPY:     Not receiving concurrent systemic therapy    SUBJECTIVE:     Interpreter required: No interpreter was required for this visit    Week 1: Today she says she has a stomach bug so not feeling well. Uses baby lotion to moisturize daily but will switch to cerave     OBJECTIVE:     Vitals  There were no vitals filed for this visit.  Weight  Current weight:    Wt Readings from Last 10 Encounters:   10/18/23 88.5 kg (195 lb)   09/21/23 90.4 kg (199 lb 6.4 oz)   09/21/23 90.4 kg (199 lb 3.2 oz)   08/21/23 92.5 kg (204 lb)   08/06/23 90.7 kg (200 lb)   07/26/23 90.7 kg (200 lb)   07/03/23 91.5 kg (201 lb 12.8 oz)   06/21/23 90.7 kg (200 lb)   05/02/23 91.8 kg (202 lb 6.4 oz)   12/16/22 90.7 kg (200 lb)       Pain: 0 out of 10  KPS: 90 - Able to carry normal activity, minor signs or symptoms of disease    TOXICITY:     General/Constitutional:  Fatigue: 1- Fatigue relieved by rest    Breast:  Breast/chest wall pain: 0- None  Breast/chest wall edema: None  Breast/chest wall folliculitis: None    Skin:  Radiation dermatitis: 0- None    Extremity:  Limb edema: 0- None  Joint range of motion decreased: 0-  None    Chemo/Hormone therapy related:  Hot flashes: 0- None    Treatment break of at least 3 consecutive days:  -No    RECOMMENDATIONS:     *Pain  -Not applicable, patient not currently in pain    *Skin  2/10: using baby lotion     Lauraine FORBES Rigg, RN

## 2023-10-24 ENCOUNTER — Ambulatory Visit: Payer: Self-pay

## 2023-10-25 ENCOUNTER — Inpatient Hospital Stay
Admission: RE | Admit: 2023-10-25 | Discharge: 2023-10-25 | Disposition: A | Discharge status: Home / Self Care | Payer: Self-pay | Source: Ambulatory Visit | Admitting: Student in an Organized Health Care Education/Training Program

## 2023-10-25 ENCOUNTER — Ambulatory Visit: Payer: Self-pay

## 2023-10-26 ENCOUNTER — Inpatient Hospital Stay: Admission: RE | Admit: 2023-10-26 | Payer: Self-pay | Source: Ambulatory Visit

## 2023-10-26 ENCOUNTER — Ambulatory Visit: Payer: Self-pay

## 2023-10-27 ENCOUNTER — Encounter (INDEPENDENT_AMBULATORY_CARE_PROVIDER_SITE_OTHER): Payer: Self-pay

## 2023-10-29 ENCOUNTER — Other Ambulatory Visit: Payer: Self-pay | Admitting: Student in an Organized Health Care Education/Training Program

## 2023-10-29 ENCOUNTER — Inpatient Hospital Stay
Admission: RE | Admit: 2023-10-29 | Payer: Self-pay | Source: Ambulatory Visit | Admitting: Student in an Organized Health Care Education/Training Program

## 2023-10-29 ENCOUNTER — Inpatient Hospital Stay: Admission: RE | Admit: 2023-10-29 | Payer: Self-pay | Source: Ambulatory Visit

## 2023-10-29 ENCOUNTER — Encounter: Payer: Self-pay | Admitting: Student in an Organized Health Care Education/Training Program

## 2023-10-29 ENCOUNTER — Ambulatory Visit: Payer: Self-pay

## 2023-10-29 DIAGNOSIS — Z170 Estrogen receptor positive status [ER+]: Secondary | ICD-10-CM

## 2023-10-29 NOTE — Progress Notes (Signed)
 RADIATION ONCOLOGY ON-TREATMENT VISIT  Patient Name: [name]  Date of Birth: December 09, 1955  Medical Record Number: [id number]  Encounter Date: 10/29/2023  Primary Radiation Oncologist: Lauraine DOROTHA Jock, MD    Consulting Physicians and Providers:  Referring physician or provider: Dr. Roselind  Primary care physician or provider: Crisoforo Fuller, MD  Breast surgical oncology: Dr. Roselind  Plastic surgeon: Lindley Anes, MD    DIAGNOSIS:     [name] is a 68 y.o. female with L breast cancer    RADIOTHERAPY TREATMENT:     Intent: Curative  Modality: Photons    Treatment Site Total dose prescribed  Total fractions  prescribed Fractional (daily) dose   L breast    4240 cGy 16 265 cGy   Prescribed sum dose: 4240 cGy        Current dose delivered: 795 cGy  Current fractions delivered: 3    SYSTEMIC THERAPY:     Not receiving concurrent systemic therapy    SUBJECTIVE:     Interpreter required: No interpreter was required for this visit    Week 1: Today she says she has a stomach bug so not feeling well. Uses baby lotion to moisturize daily but will switch to cerave     OBJECTIVE:     Vitals  There were no vitals filed for this visit.  Weight  Current weight:    Wt Readings from Last 10 Encounters:   10/18/23 88.5 kg (195 lb)   09/21/23 90.4 kg (199 lb 6.4 oz)   09/21/23 90.4 kg (199 lb 3.2 oz)   08/21/23 92.5 kg (204 lb)   08/06/23 90.7 kg (200 lb)   07/26/23 90.7 kg (200 lb)   07/03/23 91.5 kg (201 lb 12.8 oz)   06/21/23 90.7 kg (200 lb)   05/02/23 91.8 kg (202 lb 6.4 oz)   12/16/22 90.7 kg (200 lb)       Pain: 0 out of 10  KPS: 90 - Able to carry normal activity, minor signs or symptoms of disease     No skin breakdown, grade 0 dermatitis    TOXICITY:     General/Constitutional:  Fatigue: 1- Fatigue relieved by rest    Breast:  Breast/chest wall pain: 0- None  Breast/chest wall edema: None  Breast/chest wall folliculitis: None    Skin:  Radiation dermatitis: 0- None    Extremity:  Limb edema: 0- None  Joint  range of motion decreased: 0- None    Chemo/Hormone therapy related:  Hot flashes: 0- None    Treatment break of at least 3 consecutive days:  -No    RECOMMENDATIONS:     *Pain  -Not applicable, patient not currently in pain    *Skin  2/10: using baby lotion     PLAN:     *Radiotherapy treatment  -Radiotherapy will continue as prescribed  -Discussed expectations of treatment plan, radiotherapy course, and side effects      *Follow-up  -Follow-up in radiation oncology clinic with APP 1 month  after the completion of radiotherapy  -Discussed follow-up plan  -Follow-up with other physicians and care team providers  -Patient will reach out via MyChart with any questions                    Lauraine DOROTHA Jock, MD        Attending Radiation Oncologist        Department of Advanced Radiation Oncology & Proton Therapy  [facility]        [address]        [place], [place] [address]             T [phone]  F [phone]        [email or web address]

## 2023-10-29 NOTE — Progress Notes (Signed)
Dr. Orma Render wants patient to start lymphedema therapy for left breast cancer.

## 2023-10-30 ENCOUNTER — Ambulatory Visit: Payer: Self-pay

## 2023-10-30 NOTE — Progress Notes (Signed)
 RADIATION ONCOLOGY ON-TREATMENT VISIT NURSING INTAKE    Patient Name: [name]  Date of Birth: 05-10-1956  Medical Record Number: [id number]  Encounter Date: 11/05/2023  Primary Radiation Oncologist: Lauraine DOROTHA Jock, MD    Consulting Physicians and Providers:  Referring physician or provider: Dr. Roselind  Primary care physician or provider: Crisoforo Fuller, MD  Breast surgical oncology: Dr. Roselind  Plastic surgeon: Lindley Anes, MD    DIAGNOSIS:     [name] is a 68 y.o. female with L breast cancer    RADIOTHERAPY TREATMENT:     Intent: Curative  Modality: Photons    Treatment Site Total dose prescribed  Total fractions  prescribed Fractional (daily) dose   L breast    4240 cGy 16 265 cGy   Prescribed sum dose: 4240 cGy        Current dose delivered: 1855 cGy  Current fractions delivered: 7    SYSTEMIC THERAPY:     Not receiving concurrent systemic therapy    SUBJECTIVE:     Interpreter required: No interpreter was required for this visit    Week 1: Today she says she has a stomach bug so not feeling well. Uses baby lotion to moisturize daily but will switch to cerave     Week 2: Patient reports 6/10 intermittent sharp pains. She takes 500 mg Tylenol  as needed and this is effective for her. There is mild erythema to the L breast with no desquamation. She is moisturizing with baby oil and baby lotion daily. Recommended to apply powder underneath breast.     OBJECTIVE:     Vitals  There were no vitals filed for this visit.  Weight  Current weight:    Wt Readings from Last 10 Encounters:   10/18/23 88.5 kg (195 lb)   09/21/23 90.4 kg (199 lb 6.4 oz)   09/21/23 90.4 kg (199 lb 3.2 oz)   08/21/23 92.5 kg (204 lb)   08/06/23 90.7 kg (200 lb)   07/26/23 90.7 kg (200 lb)   07/03/23 91.5 kg (201 lb 12.8 oz)   06/21/23 90.7 kg (200 lb)   05/02/23 91.8 kg (202 lb 6.4 oz)   12/16/22 90.7 kg (200 lb)       Pain: 0 out of 10  KPS: 90 - Able to carry normal activity, minor signs or symptoms of disease    TOXICITY:      General/Constitutional:  Fatigue: 1- Fatigue relieved by rest    Breast:  Breast/chest wall pain: 1- Mild pain  Breast/chest wall edema: None  Breast/chest wall folliculitis: None    Skin:  Radiation dermatitis: 1- Faint erythema or dry desquamation    Extremity:  Limb edema: 0- None  Joint range of motion decreased: 0- None    Chemo/Hormone therapy related:  Hot flashes: 0- None    Treatment break of at least 3 consecutive days:  -No    RECOMMENDATIONS:     *Pain  -Not applicable, patient not currently in pain    *Skin  2/10: using baby lotion     Damien Mason, RN

## 2023-10-31 ENCOUNTER — Ambulatory Visit: Payer: Self-pay

## 2023-11-01 ENCOUNTER — Inpatient Hospital Stay: Admission: RE | Admit: 2023-11-01 | Payer: Self-pay | Source: Ambulatory Visit

## 2023-11-01 ENCOUNTER — Ambulatory Visit: Payer: Self-pay

## 2023-11-02 ENCOUNTER — Ambulatory Visit: Payer: Self-pay

## 2023-11-02 ENCOUNTER — Inpatient Hospital Stay: Admission: RE | Admit: 2023-11-02 | Payer: Self-pay | Source: Ambulatory Visit

## 2023-11-05 ENCOUNTER — Inpatient Hospital Stay
Admission: RE | Admit: 2023-11-05 | Payer: Self-pay | Source: Ambulatory Visit | Admitting: Student in an Organized Health Care Education/Training Program

## 2023-11-05 ENCOUNTER — Inpatient Hospital Stay: Admission: RE | Admit: 2023-11-05 | Payer: Self-pay | Source: Ambulatory Visit

## 2023-11-05 ENCOUNTER — Ambulatory Visit: Payer: Self-pay

## 2023-11-05 ENCOUNTER — Encounter: Payer: Self-pay | Admitting: Student in an Organized Health Care Education/Training Program

## 2023-11-06 ENCOUNTER — Inpatient Hospital Stay: Admission: RE | Admit: 2023-11-06 | Payer: Self-pay | Source: Ambulatory Visit

## 2023-11-06 ENCOUNTER — Ambulatory Visit: Payer: Self-pay

## 2023-11-07 ENCOUNTER — Ambulatory Visit: Payer: Self-pay

## 2023-11-07 ENCOUNTER — Inpatient Hospital Stay: Admission: RE | Admit: 2023-11-07 | Payer: Self-pay | Source: Ambulatory Visit

## 2023-11-07 NOTE — Patient Instructions (Signed)
 Discharge Instructions after Radiation to the Breast     You have completed 16 radiation treatments to your left breast.  You were treated by Dr. Gao.  You can make your follow up appointment with Shoshana Spiro, PA in 1 month in the radiation oncology office [facility] Dr. Elysa P-3).  Please call us  at [phone] if you have any scheduling questions, or [phone] for any nursing/ medical questions.     SKIN  Skin irritation may continue for several weeks. In fact, it is expected that you may have more redness and tender peeling for 10-14 days following radiation.  Continue to apply powders and moisturizer.  If we sent in prescription creams, continue to use these until the skin has healed.  If you develop tender, peeling areas after completing treatment and you do not have a prescription cream, contact our office to request this.  Other reasons to contact our office immediately are:   Fever  Redness/ tenderness that spread outside of the breast/ armpit/ collarbone areas     Dryness and pigment changes may continue for weeks to months and could be permanent.  Minimize prolonged, direct sun exposure of the treated area, and use SUNSCREEN with a SPF factor of 30 or higher to sun-exposed areas (this is a healthy habit regardless).     PAIN  Mild to moderate soreness and intermittent shooting pains are expected after breast radiation for 6-8 weeks.  Most patients can manage this with Tylenol  or ibuprofen .  Please let us  know if you develop worsening pain that is not controlled.  You can also contact your surgeon for concerns.     ACTIVITY   Fatigue, feelings of weakness or weariness experienced during treatment usually resolve over 4-6 weeks or longer.  Resume or continue your usual daily routine activities as tolerated.  If you feel tired, limit activities and rest as needed.    DIET  Continue to eat a balanced, healthy diet.  Your body is recovering from treatment and needs good nutrition for at least  4-6 weeks to heal and rebuild healthy tissue.  Aside from making healthy food choices, there are no special dietary restrictions after breast radiation.     MEDICATION   Continue taking your current medications unless instructed otherwise.  For refills, it is generally best to call the office of the prescribing physician.

## 2023-11-07 NOTE — Progress Notes (Signed)
**Note [name]**  RADIATION ONCOLOGY ON-TREATMENT VISIT NURSING INTAKE    Patient Name: Cole Klugh  Date of Birth: 1956-03-17  Medical Record Number: [id number]  Encounter Date: 11/12/2023  Primary Radiation Oncologist: Lauraine DOROTHA Jock, MD    Consulting Physicians and Providers:  Referring physician or provider: Dr. Roselind  Primary care physician or provider: Crisoforo Fuller, MD  Breast surgical oncology: Dr. Roselind  Plastic surgeon: Lindley Anes, MD    DIAGNOSIS:     [name] is a 68 y.o. female with L breast cancer    RADIOTHERAPY TREATMENT:     Intent: Curative  Modality: Photons    Treatment Site Total dose prescribed  Total fractions  prescribed Fractional (daily) dose   L breast    4240 cGy 16 265 cGy   Prescribed sum dose: 4240 cGy        Current dose delivered: 2915 cGy  Current fractions delivered: 11    SYSTEMIC THERAPY:     Not receiving concurrent systemic therapy    SUBJECTIVE:     Interpreter required: No interpreter was required for this visit    Week 1: Today she says she has a stomach bug so not feeling well. Uses baby lotion to moisturize daily but will switch to cerave     Week 2: Patient reports 6/10 intermittent sharp pains. She takes 500 mg Tylenol  as needed and this is effective for her. There is mild erythema to the L breast with no desquamation. She is moisturizing with baby oil and baby lotion daily. Recommended to apply powder underneath breast.     Week 3: No pain currently but has occasional nerve pain and soreness in her left axilla. Taking tylenol  PRN. She notes increased fatigue this week. She also notes increased itching in left breast. Currently only using Eucerin BID and an unknown powder occasionally under her breast.     OBJECTIVE:     Vitals  There were no vitals filed for this visit.  Weight  Current weight:    Wt Readings from Last 10 Encounters:   10/18/23 88.5 kg (195 lb)   09/21/23 90.4 kg (199 lb 6.4 oz)   09/21/23 90.4 kg (199 lb 3.2 oz)   08/21/23 92.5 kg (204 lb)    08/06/23 90.7 kg (200 lb)   07/26/23 90.7 kg (200 lb)   07/03/23 91.5 kg (201 lb 12.8 oz)   06/21/23 90.7 kg (200 lb)   05/02/23 91.8 kg (202 lb 6.4 oz)   12/16/22 90.7 kg (200 lb)       Pain: 0 out of 10  KPS: 90 - Able to carry normal activity, minor signs or symptoms of disease    TOXICITY:     General/Constitutional:  Fatigue: 1- Fatigue relieved by rest    Breast:  Breast/chest wall pain: 1- Mild pain  Breast/chest wall edema: None  Breast/chest wall folliculitis: Mild    Skin:  Radiation dermatitis: 1- Faint erythema or dry desquamation    Extremity:  Limb edema: 0- None  Joint range of motion decreased: 0- None    Chemo/Hormone therapy related:  Hot flashes: 0- None    Treatment break of at least 3 consecutive days:  -No    RECOMMENDATIONS:     *Pain  -Not applicable, patient not currently in pain    *Skin  2/10: using Eucerin BID and an unknown powder    Ellouise Leontine Hint, RN

## 2023-11-08 ENCOUNTER — Inpatient Hospital Stay: Admission: RE | Admit: 2023-11-08 | Payer: Self-pay | Source: Ambulatory Visit

## 2023-11-08 ENCOUNTER — Ambulatory Visit: Payer: Self-pay

## 2023-11-09 ENCOUNTER — Inpatient Hospital Stay: Admission: RE | Admit: 2023-11-09 | Payer: Self-pay | Source: Ambulatory Visit

## 2023-11-09 ENCOUNTER — Ambulatory Visit: Payer: Self-pay

## 2023-11-11 NOTE — Progress Notes (Signed)
 RADIATION ONCOLOGY ON-TREATMENT VISIT  Patient Name: [name]  Date of Birth: 15-Jan-1956  Medical Record Number: [id number]  Encounter Date: 11/05/2023  Primary Radiation Oncologist: Lauraine DOROTHA Jock, MD    Consulting Physicians and Providers:  Referring physician or provider: Dr. Roselind  Primary care physician or provider: Crisoforo Fuller, MD  Breast surgical oncology: Dr. Roselind  Plastic surgeon: Lindley Anes, MD    DIAGNOSIS:     [name] is a 68 y.o. female with L breast cancer    RADIOTHERAPY TREATMENT:     Intent: Curative  Modality: Photons    Treatment Site Total dose prescribed  Total fractions  prescribed Fractional (daily) dose   L breast    4240 cGy 16 265 cGy   Prescribed sum dose: 4240 cGy        Current dose delivered: 1855 cGy  Current fractions delivered: 7    SYSTEMIC THERAPY:     Not receiving concurrent systemic therapy    SUBJECTIVE:     Interpreter required: No interpreter was required for this visit    Week 1: Today she says she has a stomach bug so not feeling well. Uses baby lotion to moisturize daily but will switch to cerave     Week 2: Patient reports 6/10 intermittent sharp pains. She takes 500 mg Tylenol  as needed and this is effective for her. There is mild erythema to the L breast with no desquamation. She is moisturizing with baby oil and baby lotion daily. Recommended to apply powder underneath breast.     OBJECTIVE:     Vitals  There were no vitals filed for this visit.  Weight  Current weight:    Wt Readings from Last 10 Encounters:   10/18/23 88.5 kg (195 lb)   09/21/23 90.4 kg (199 lb 6.4 oz)   09/21/23 90.4 kg (199 lb 3.2 oz)   08/21/23 92.5 kg (204 lb)   08/06/23 90.7 kg (200 lb)   07/26/23 90.7 kg (200 lb)   07/03/23 91.5 kg (201 lb 12.8 oz)   06/21/23 90.7 kg (200 lb)   05/02/23 91.8 kg (202 lb 6.4 oz)   12/16/22 90.7 kg (200 lb)       Pain: 0 out of 10  KPS: 90 - Able to carry normal activity, minor signs or symptoms of disease     No skin breakdown,  grade 0 dermatitis    TOXICITY:     General/Constitutional:  Fatigue: 1- Fatigue relieved by rest    Breast:  Breast/chest wall pain: 0- None  Breast/chest wall edema: None  Breast/chest wall folliculitis: None    Skin:  Radiation dermatitis: 0- None    Extremity:  Limb edema: 0- None  Joint range of motion decreased: 0- None    Chemo/Hormone therapy related:  Hot flashes: 0- None    Treatment break of at least 3 consecutive days:  -No    RECOMMENDATIONS:     *Pain  -Not applicable, patient not currently in pain    *Skin  2/10: using baby lotion   2/17: Patient will try Cerave    PLAN:     *Radiotherapy treatment  -Radiotherapy will continue as prescribed  -Discussed expectations of treatment plan, radiotherapy course, and side effects      *Follow-up  -Follow-up in radiation oncology clinic with APP 1 month  after the completion of radiotherapy  -Discussed follow-up plan  -Follow-up with other physicians and care team providers  -Patient will reach out via MyChart  with any questions                    Lauraine DOROTHA Jock, MD        Attending Radiation Oncologist        Department of Advanced Radiation Oncology & Proton Therapy        [facility]        [address]        [place], [place] [address]             T [phone]  F [phone]        [email or web address]

## 2023-11-12 ENCOUNTER — Inpatient Hospital Stay: Admission: RE | Admit: 2023-11-12 | Payer: Self-pay | Source: Ambulatory Visit

## 2023-11-12 ENCOUNTER — Inpatient Hospital Stay
Admission: RE | Admit: 2023-11-12 | Payer: Self-pay | Source: Ambulatory Visit | Admitting: Student in an Organized Health Care Education/Training Program

## 2023-11-12 NOTE — Progress Notes (Signed)
 RADIATION ONCOLOGY ON-TREATMENT VISIT  Patient Name: [name]  Date of Birth: [birth year]  Medical Record Number: [id number]  Encounter Date: 11/12/2023  Primary Radiation Oncologist: Lauraine DOROTHA Jock, MD    Consulting Physicians and Providers:  Referring physician or provider: Dr. Roselind  Primary care physician or provider: Crisoforo Fuller, MD  Breast surgical oncology: Dr. Roselind  Plastic surgeon: Lindley Anes, MD    DIAGNOSIS:     Gerrie Castiglia is a 68 y.o. female with L breast cancer    RADIOTHERAPY TREATMENT:     Intent: Curative  Modality: Photons    Treatment Site Total dose prescribed  Total fractions  prescribed Fractional (daily) dose   L breast    4240 cGy 16 265 cGy   Prescribed sum dose: 4240 cGy        Current dose delivered: 2915 cGy  Current fractions delivered: 11    SYSTEMIC THERAPY:     Not receiving concurrent systemic therapy    SUBJECTIVE:     Interpreter required: No interpreter was required for this visit    Week 1: Today she says she has a stomach bug so not feeling well. Uses baby lotion to moisturize daily but will switch to cerave     Week 2: Patient reports 6/10 intermittent sharp pains. She takes 500 mg Tylenol  as needed and this is effective for her. There is mild erythema to the L breast with no desquamation. She is moisturizing with baby oil and baby lotion daily. Recommended to apply powder underneath breast.     Week 3: No pain currently but has occasional nerve pain and soreness in her left axilla. Taking tylenol  PRN. She notes increased fatigue this week. She also notes increased itching in left breast. Currently only using Eucerin BID and an unknown powder occasionally under her breast.        OBJECTIVE:     Vitals  There were no vitals filed for this visit.  Weight  Current weight:    Wt Readings from Last 10 Encounters:   10/18/23 88.5 kg (195 lb)   09/21/23 90.4 kg (199 lb 6.4 oz)   09/21/23 90.4 kg (199 lb 3.2 oz)   08/21/23 92.5 kg (204 lb)   08/06/23 90.7  kg (200 lb)   07/26/23 90.7 kg (200 lb)   07/03/23 91.5 kg (201 lb 12.8 oz)   06/21/23 90.7 kg (200 lb)   05/02/23 91.8 kg (202 lb 6.4 oz)   12/16/22 90.7 kg (200 lb)       Pain: 0 out of 10  KPS: 90 - Able to carry normal activity, minor signs or symptoms of disease     No skin breakdown, grade 1 dermatitis    TOXICITY:     General/Constitutional:  Fatigue: 1- Fatigue relieved by rest    Breast:  Breast/chest wall pain: 0- None  Breast/chest wall edema: None  Breast/chest wall folliculitis: None    Skin:  Radiation dermatitis: 0- None    Extremity:  Limb edema: 0- None  Joint range of motion decreased: 0- None    Chemo/Hormone therapy related:  Hot flashes: 0- None    Treatment break of at least 3 consecutive days:  -No    RECOMMENDATIONS:     *Pain  -Not applicable, patient not currently in pain    *Skin  2/10: using baby lotion   2/17: Patient will try Cerave    PLAN:     *Radiotherapy treatment  -Radiotherapy will continue as prescribed  -  Discussed expectations of treatment plan, radiotherapy course, and side effects      *Follow-up  -Follow-up in radiation oncology clinic with APP 1 month  after the completion of radiotherapy  -Discussed follow-up plan  -Follow-up with other physicians and care team providers  -Patient will reach out via MyChart with any questions                    Lauraine DOROTHA Jock, MD        Attending Radiation Oncologist        Department of Advanced Radiation Oncology & Proton Therapy        [facility] [place] [facility]        [address]        [place], [place] [address]             T [phone]  F [phone]        [email or web address]

## 2023-11-13 ENCOUNTER — Inpatient Hospital Stay: Admission: RE | Admit: 2023-11-13 | Payer: Self-pay | Source: Ambulatory Visit

## 2023-11-14 ENCOUNTER — Inpatient Hospital Stay: Admission: RE | Admit: 2023-11-14 | Payer: Self-pay | Source: Ambulatory Visit

## 2023-11-15 ENCOUNTER — Ambulatory Visit: Payer: Self-pay

## 2023-11-15 ENCOUNTER — Encounter (INDEPENDENT_AMBULATORY_CARE_PROVIDER_SITE_OTHER): Payer: Self-pay

## 2023-11-15 ENCOUNTER — Inpatient Hospital Stay: Admission: RE | Admit: 2023-11-15 | Payer: Self-pay | Source: Ambulatory Visit

## 2023-11-15 ENCOUNTER — Telehealth (INDEPENDENT_AMBULATORY_CARE_PROVIDER_SITE_OTHER): Payer: Self-pay

## 2023-11-15 NOTE — Progress Notes (Signed)
 SLEEP MEDICINE OFFICE FOLLOW-UP NOTE    HRT [facility] OFFICE  [place]  HEART [facility] OFFICE -CARDIOLOGY  [address]  [place] [address]  Dept: [phone]  Dept Fax: [phone]         Patient Name: [name]    Date of Visit:  November 16, 2023  Date of Birth: Jan 12, 1956  AGE: 68 y.o.  Medical Record #: [id number]  Requesting Physician: Rosalynd Star, MD    CHIEF COMPLAINT:  Follow-up (Sleep compliance)      HISTORY OF PRESENT ILLNESS    Ms. Vohs is a pleasant 68 y.o. female with history of hypertension whom I was able to see today for overdue sleep medicine follow-up.    06/02/2022 HST: AHI 34/RDI 35/average O2 91%/low O2 67%/time below 90% = 83 minutes    Minimal CPAP usage since LOV. Unfortunately she has historically been noncompliant on CPAP d/t mask anxiety, claustrophobia, and general discomfort with PAP. She returns to discuss alternative treatments.     The last year has been very difficult for her including the death of her father, managing his estate, and personal diagnosis of breast cancer and undergoing treatment. Her last radiation treatment is next week.    ESS 9.  Denies drowsy driving.        PAST MEDICAL HISTORY: She has a past medical history of Abnormal vision, Anxiety, Arthritis, Carpal tunnel syndrome on both sides, Chronic obstructive pulmonary disease, Difficulty walking, Eczema, Fracture, Gastroesophageal reflux disease, History of gonorrhea, Hx of Hepatitis C, Hyperlipidemia, Hypertensive disorder, Lower back pain, Malignant neoplasm of left female breast, unspecified estrogen receptor status, unspecified site of breast, Neck pain, Neuropathy, Pneumonia, Sleep apnea, and Vein disorder. She has a past surgical history that includes Ganglion cyst excision (Right, 2007); Knee arthroscopy (Right, 09/24/2014); Knee arthroscopy (Left, 03/2015); COLONOSCOPY, DIAGNOSTIC (SCREENING) (2016); Induced abortion; Vaginal delivery; ARTHROPLASTY, KNEE, TOTAL (Left,  12/14/2015); Tonsillectomy; REPAIR, UPPER EXTEMITY TENDON (Right, 03/02/2017); ENDOSCOPIC CARPAL TUNNEL RELEASE (Right, 03/02/2017); BREAST, LUMPECTOMY (Left, 08/06/2023); PLACEMENT, PERCUTANEOUS, DEVICE, BREAST LOCALIZATION WITH ULTRASOUND GUIDANCE (Left, 08/06/2023); BIOPSY, BREAST, SENTINEL NODE (Left, 08/06/2023); LYMPHANGIOGRAM/LYMPHANGIOGRAPHY (Left, 08/06/2023); TRANSFER/REARRANGEMENT, TISSUE, ADJACENT, TRUNK (Left, 08/06/2023); MAMMOPLASTY, REDUCTION, BREAST HYPERTROPHY (MEDICAL) (Left, 08/06/2023); and REVISION, SCAR LEVEL 1 (MEDICAL) (Left, 08/06/2023).    ALLERGIES:   Allergies   Allergen Reactions    Latex Rash       MEDICATIONS:   Patient's current medications were reviewed. ONLY Sleep Medicine related medications were updated unless others were addressed in assessment and plan.  Current Outpatient Medications   Medication Instructions    albuterol  sulfate HFA (PROVENTIL ) 108 (90 Base) MCG/ACT inhaler 2 puffs, Inhalation, Every 4 hours PRN, Dispense with spacer    atorvastatin  (LIPITOR) 20 mg, Oral, Daily    betamethasone  dipropionate (DEL-BETA) 0.05 % cream 1 .application, As needed    ELDERBERRY PO 3,200 mg    famotidine  (PEPCID ) 40 mg, At bedtime    fluticasone-umeclidinium-vilanterol (Trelegy Ellipta ) 100-62.5-25 MCG/ACT Aerosol Pwdr, Breath Activated 1 puff, Inhalation, Daily    letrozole  (FEMARA ) 2.5 MG tablet TAKE 1 TABLET BY MOUTH DAILY    losartan -hydrochlorothiazide  (HYZAAR) 100-12.5 MG per tablet 1 tablet, Oral, Daily    Magnesium 250 mg    Nebulizer Misc Dispense one nebulizer machine to include mask and tubing    vitamin D  (cholecalciferol ) 25 MCG (1000 UT) tablet 1 tablet    Vitamin E 670 MG (1000 UT) Cap 1 capsule       FAMILY HISTORY: family history includes Arthritis in her father; Breast  cancer in her sister and other family members; Breast cancer (age of onset: 26) in her paternal cousin; Breast cancer (age of onset: 51) in her paternal cousin; Heart disease in her maternal  grandmother; Hypertension in her father and mother; Kidney disease in her brother; Prostate cancer in her paternal grandfather; Stroke in her mother.      SOCIAL HISTORY: She reports that she has been smoking cigarettes. She has a 7.5 pack-year smoking history. She has never used smokeless tobacco. She reports current drug use. She reports that she does not drink alcohol.    REVIEW OF SYSTEMS:    No data recorded      PHYSICAL EXAMINATION    Visit Vitals  BP 130/82 (BP Site: Right arm, Patient Position: Sitting, Cuff Size: Large)   Pulse 77   Ht 1.524 m (5')   Wt 89.4 kg (197 lb)   SpO2 99%   BMI 38.47 kg/m            Constitutional: Cooperative, alert and oriented,well developed, well nourished, in no acute distress.,   Head: Normocephalic, normal hair pattern. Edentulous  Eyes: conjunctivae and lids normal   Chest: Normal respiration without extremus   Neurological: No gross motor or sensory deficits noted  Psych: affect appropriate, oriented to time, person and place.    LABS:   Lab Results   Component Value Date    WBC 6.98 09/22/2023    HGB 13.6 09/22/2023    HCT 41.5 09/22/2023    PLT 269 09/22/2023     No results found for: IRON, TIBC, FERRITIN  Lab Results   Component Value Date    TSH 0.87 05/02/2023    HGBA1C 5.8 (H) 05/02/2023    BNP 77 04/07/2022       ASSESSMENT and PLAN:    Severe obstructive Sleep Apnea/nocturnal hypoxemia:  06/02/2022 HST: AHI 34/RDI 35/average O2 91%/low O2 67%/time below 90% = 83 minutes  Setup with [employer], initiated on 10/23/2022  ResMed AS10 + traditional fullface mask.    Historically noncompliant.  Claustrophobia, mask anxiety, and general discomfort despite mask changes and pressure changes.  Intolerant of PAP therapy.  Her last visit she was donated refurbished DS 1 machine which she will keep for now.  Discussed alternative treatment options including weight loss, ENT surgery, Inspire, positional therapy.  She slept supine throughout the entirety of her diagnostic  sleep test.  Unsure if positional therapy is effective as a solo treatment.  Provided with a list of positional devices.  Discussed Inspire as an option. The Inspire Upper Airway Stimulation device (aka hypoglossal nerve stimulation or HNS device) for sleep apnea is an implantable hypoglossal nerve stimulator connected to a pacemaker in the chest which receives signals from leads in the intercostal muscles. The device is surgically implanted by an ENT specialist, turned on during sleep and provides stimulation of the upper airway muscles during inspiration to open the airway.  Usual criteria for consideration is failure of PAP therapy, AHI between 15-65/hr, BMI < 35, with < 25% central events, and drug-induced sleep endoscopy (DISE) indicating appropriate closure of the airway.  Efficacy is roughly 70% and snoring may or may not resolve completely. In cases of patients with insomnia, insomnia may not be treated by [facility] unless it is related to sleep disordered breathing.   The patient was reminded not to drive or operate heavy equipment if drowsy and how body position during sleep, weight changes, alcohol, and certain sedative medications can affect sleep apnea.  In the mean time, head of bed elevation / side sleeping position was recommended.  A regular sleep-wake cycle with 7-8 hours of sleep per night along with good sleep hygiene were recommended.  Emphasized the dangers of sustained hypoxemia and strongly advised she reach out for help if having trouble acclimating to PAP therapy.  Risks of untreated or suboptimally treated sleep apnea including but not limited to  MI, TIA/CVA, dementia, metabolic and endocrine disturbances, excessive daytime sleepiness.      Hypertension:  We discussed the relationship between untreated OSA and cardiovascular processes such as hypertension, dysrhythmias, stroke, CAD and the like and how treating moderate to severe OSA could help reduce future risks for occurrence  1.  Stable and following with our Cardiology team  2. I have reviewed their cardiology notes  3.  06/20/2022 echocardiogram: EF 63%    Weight:  We discussed the relationship of increased weight as it relates to obstruction sleep apnea and how maintenance of ideal body weight could likely improve sleep apnea severity.  If she decides to pursue Inspire, the surgeon is likely to require some weight loss, which she will begin working on.                                                 No orders of the defined types were placed in this encounter.    Provided with list of Inspire surgeons and positional therapy devices. If she does well with positional therapy, she will message via MyChart when ready to repeat sleep testing to assess effectiveness. Follow-up TBD.    No orders of the defined types were placed in this encounter.        SIGNED:    Skyelar Halliday E Dally Oshel, NP           Please pardon any potential grammatical errors or typos as aspects of this note may have been created through speech-to-text software.

## 2023-11-15 NOTE — Telephone Encounter (Signed)
 Spoke to PT as a reminder to bring in PAP machine along with power cord to upcoming appointment.

## 2023-11-16 ENCOUNTER — Ambulatory Visit (INDEPENDENT_AMBULATORY_CARE_PROVIDER_SITE_OTHER): Payer: No Typology Code available for payment source | Admitting: Adult Health

## 2023-11-16 ENCOUNTER — Ambulatory Visit: Payer: Self-pay

## 2023-11-16 ENCOUNTER — Encounter: Payer: Self-pay | Admitting: Student in an Organized Health Care Education/Training Program

## 2023-11-16 ENCOUNTER — Encounter (INDEPENDENT_AMBULATORY_CARE_PROVIDER_SITE_OTHER): Payer: Self-pay | Admitting: Adult Health

## 2023-11-16 ENCOUNTER — Inpatient Hospital Stay: Admission: RE | Admit: 2023-11-16 | Payer: Self-pay | Source: Ambulatory Visit

## 2023-11-16 VITALS — BP 130/82 | HR 77 | Ht 60.0 in | Wt 197.0 lb

## 2023-11-16 DIAGNOSIS — G4733 Obstructive sleep apnea (adult) (pediatric): Secondary | ICD-10-CM

## 2023-11-16 DIAGNOSIS — G4734 Idiopathic sleep related nonobstructive alveolar hypoventilation: Secondary | ICD-10-CM

## 2023-11-16 DIAGNOSIS — I10 Essential (primary) hypertension: Secondary | ICD-10-CM

## 2023-11-16 NOTE — Patient Instructions (Addendum)
 Positional therapy:    [email or web address]          [email or web address]    Neck strap application: Has magnet. Cannot use with implants.    Chest strap application: No Magnet.         For improved success:  Consider using it with side-sleeping pillow to help reduce neck pain.  Mattress toppers are also found helpful to improving comfort while sleeping on your side.     The [facility] established billing code [id number] (Electronic positional obstructive sleep apnea treatment) to cover devices like the Night Shift and code [id number] to cover Night Shift accessories.         Rematee:    [email or web address]          Sleep Noodle:  [email or web address]            SlumberBump:  [email or web address]          The Inspire Upper Airway Stimulation device (aka hypoglossal nerve stimulation or HNS device) for sleep apnea is an implantable hypoglossal nerve stimulator connected to a pacemaker in the chest which receives signals from leads in the intercostal muscles. The device is surgically implanted by an ENT specialist, turned on during sleep and provides stimulation of the upper airway muscles during inspiration to open the airway.  Usual criteria for consideration is failure of PAP therapy, AHI between 15-65/hr, BMI < 35, with < 25% central events, and drug-induced sleep endoscopy (DISE) indicating appropriate closure of the airway.  Efficacy is roughly 70% and snoring may or may not resolve completely. In cases of patients with insomnia, insomnia may not be treated by [facility] unless it is related to sleep disordered breathing.

## 2023-11-17 ENCOUNTER — Ambulatory Visit
Admission: RE | Admit: 2023-11-17 | Discharge: 2023-11-17 | Disposition: A | Discharge status: Home / Self Care | Source: Ambulatory Visit | Attending: Surgery | Admitting: Surgery

## 2023-11-17 DIAGNOSIS — C50912 Malignant neoplasm of unspecified site of left female breast: Secondary | ICD-10-CM | POA: Insufficient documentation

## 2023-11-17 DIAGNOSIS — Z510 Encounter for antineoplastic radiation therapy: Secondary | ICD-10-CM | POA: Insufficient documentation

## 2023-11-19 ENCOUNTER — Inpatient Hospital Stay
Admission: RE | Admit: 2023-11-19 | Payer: Self-pay | Source: Ambulatory Visit | Admitting: Student in an Organized Health Care Education/Training Program

## 2023-11-19 ENCOUNTER — Encounter: Payer: Self-pay | Admitting: Student in an Organized Health Care Education/Training Program

## 2023-11-19 ENCOUNTER — Ambulatory Visit: Payer: Self-pay

## 2023-11-19 ENCOUNTER — Inpatient Hospital Stay
Admission: RE | Admit: 2023-11-19 | Discharge: 2023-11-19 | Disposition: A | Discharge status: Home / Self Care | Payer: Self-pay | Source: Ambulatory Visit

## 2023-11-19 NOTE — Progress Notes (Signed)
 RADIATION ONCOLOGY ON-TREATMENT VISIT NURSING INTAKE    Patient Name: [name]  Date of Birth: [birth year]  Medical Record Number: [id number]  Encounter Date: 11/19/2023  Primary Radiation Oncologist: Lauraine DOROTHA Jock, MD    Consulting Physicians and Providers:  Referring physician or provider: Dr. Roselind  Primary care physician or provider: Crisoforo Fuller, MD  Breast surgical oncology: Dr. Roselind  Plastic surgeon: Lindley Anes, MD    DIAGNOSIS:     [name] is a 68 y.o. female with L breast cancer    RADIOTHERAPY TREATMENT:     Intent: Curative  Modality: Photons    Treatment Site Total dose prescribed  Total fractions  prescribed Fractional (daily) dose   L breast    4240 cGy 16 265 cGy   Prescribed sum dose: 4240 cGy        Current dose delivered: 2915 cGy  Current fractions delivered: 11    SYSTEMIC THERAPY:     Not receiving concurrent systemic therapy    SUBJECTIVE:     Interpreter required: No interpreter was required for this visit    Week 1: Today she says she has a stomach bug so not feeling well. Uses baby lotion to moisturize daily but will switch to cerave     Week 2: Patient reports 6/10 intermittent sharp pains. She takes 500 mg Tylenol  as needed and this is effective for her. There is mild erythema to the L breast with no desquamation. She is moisturizing with baby oil and baby lotion daily. Recommended to apply powder underneath breast.     Week 3: No pain currently but has occasional nerve pain and soreness in her left axilla. Taking tylenol  PRN. She notes increased fatigue this week. She also notes increased itching in left breast. Currently only using Eucerin BID and an unknown powder occasionally under her breast.     Week 4: 3/10 pain to the left breast skin. There is some moderate erythema with no desquamation. She is applying betamethasone . Patient states she feels a hard lump next to the left nipple and has noticed it this past week.     OBJECTIVE:     Vitals  There were no  vitals filed for this visit.  Weight  Current weight:    Wt Readings from Last 10 Encounters:   11/16/23 89.4 kg (197 lb)   10/18/23 88.5 kg (195 lb)   09/21/23 90.4 kg (199 lb 6.4 oz)   09/21/23 90.4 kg (199 lb 3.2 oz)   08/21/23 92.5 kg (204 lb)   08/06/23 90.7 kg (200 lb)   07/26/23 90.7 kg (200 lb)   07/03/23 91.5 kg (201 lb 12.8 oz)   06/21/23 90.7 kg (200 lb)   05/02/23 91.8 kg (202 lb 6.4 oz)       Pain: 0 out of 10  KPS: 90 - Able to carry normal activity, minor signs or symptoms of disease    TOXICITY:     General/Constitutional:  Fatigue: 1- Fatigue relieved by rest    Breast:  Breast/chest wall pain: 1- Mild pain  Breast/chest wall edema: None  Breast/chest wall folliculitis: Mild    Skin:  Radiation dermatitis: 1- Faint erythema or dry desquamation    Extremity:  Limb edema: 0- None  Joint range of motion decreased: 0- None    Chemo/Hormone therapy related:  Hot flashes: 0- None    Treatment break of at least 3 consecutive days:  -No    RECOMMENDATIONS:     *Pain  -  Not applicable, patient not currently in pain    *Skin  2/10: using Eucerin BID and an unknown powder    Damien Mason, RN

## 2023-11-19 NOTE — Progress Notes (Signed)
 RADIATION ONCOLOGY ON-TREATMENT VISIT  Patient Name: [name]  Date of Birth: 03-Feb-1956  Medical Record Number: [id number]  Encounter Date: 11/19/2023  Primary Radiation Oncologist: Lauraine DOROTHA Jock, MD    Consulting Physicians and Providers:  Referring physician or provider: Dr. Roselind  Primary care physician or provider: Crisoforo Fuller, MD  Breast surgical oncology: Dr. Roselind  Plastic surgeon: Lindley Anes, MD    DIAGNOSIS:     [name] is a 68 y.o. female with L breast cancer    RADIOTHERAPY TREATMENT:     Intent: Curative  Modality: Photons    Treatment Site Total dose prescribed  Total fractions  prescribed Fractional (daily) dose   L breast    4240 cGy 16 265 cGy   Prescribed sum dose: 4240 cGy        Current dose delivered: 4240 cGy  Current fractions delivered: 16    SYSTEMIC THERAPY:     Not receiving concurrent systemic therapy    SUBJECTIVE:     Interpreter required: No interpreter was required for this visit    Week 1: Today she says she has a stomach bug so not feeling well. Uses baby lotion to moisturize daily but will switch to cerave     Week 2: Patient reports 6/10 intermittent sharp pains. She takes 500 mg Tylenol  as needed and this is effective for her. There is mild erythema to the L breast with no desquamation. She is moisturizing with baby oil and baby lotion daily. Recommended to apply powder underneath breast.     Week 3: No pain currently but has occasional nerve pain and soreness in her left axilla. Taking tylenol  PRN. She notes increased fatigue this week. She also notes increased itching in left breast. Currently only using Eucerin BID and an unknown powder occasionally under her breast.     Week 4: 3/10 pain to the left breast skin. There is some moderate erythema with no desquamation. She is applying betamethasone . Patient states she feels a hard lump next to the left nipple and has noticed it this past week.        OBJECTIVE:     Vitals  There were no vitals  filed for this visit.  Weight  Current weight:    Wt Readings from Last 10 Encounters:   11/16/23 89.4 kg (197 lb)   10/18/23 88.5 kg (195 lb)   09/21/23 90.4 kg (199 lb 6.4 oz)   09/21/23 90.4 kg (199 lb 3.2 oz)   08/21/23 92.5 kg (204 lb)   08/06/23 90.7 kg (200 lb)   07/26/23 90.7 kg (200 lb)   07/03/23 91.5 kg (201 lb 12.8 oz)   06/21/23 90.7 kg (200 lb)   05/02/23 91.8 kg (202 lb 6.4 oz)       Pain: 0 out of 10  KPS: 90 - Able to carry normal activity, minor signs or symptoms of disease     No skin breakdown, grade 1 dermatitis    TOXICITY:     General/Constitutional:  Fatigue: 1- Fatigue relieved by rest    Breast:  Breast/chest wall pain: 0- None  Breast/chest wall edema: None  Breast/chest wall folliculitis: None    Skin:  Radiation dermatitis: 0- None    Extremity:  Limb edema: 0- None  Joint range of motion decreased: 0- None    Chemo/Hormone therapy related:  Hot flashes: 0- None    Treatment break of at least 3 consecutive days:  -No    RECOMMENDATIONS:     *  Pain  -Not applicable, patient not currently in pain    *Skin  2/10: using baby lotion   2/17: Patient will try Cerave    PLAN:     *Radiotherapy treatment  -Radiotherapy will continue as prescribed  -Discussed expectations of treatment plan, radiotherapy course, and side effects      *Follow-up  -Follow-up in radiation oncology clinic with APP 1 month  after the completion of radiotherapy  -Discussed follow-up plan  -Follow-up with other physicians and care team providers  -Patient will reach out via MyChart with any questions                    Lauraine DOROTHA Jock, MD        Attending Radiation Oncologist        Department of Advanced Radiation Oncology & Proton Therapy        [facility]        [address]        [place], [place] [address]             T [phone]  F [phone]        [email or web address]

## 2023-11-20 ENCOUNTER — Ambulatory Visit: Payer: Self-pay

## 2023-11-24 ENCOUNTER — Encounter (INDEPENDENT_AMBULATORY_CARE_PROVIDER_SITE_OTHER): Payer: Self-pay

## 2023-12-07 ENCOUNTER — Inpatient Hospital Stay
Admission: RE | Admit: 2023-12-07 | Payer: Self-pay | Source: Ambulatory Visit | Admitting: Student in an Organized Health Care Education/Training Program

## 2023-12-07 VITALS — BP 153/97 | HR 103 | Temp 97.8°F | Resp 20

## 2023-12-07 DIAGNOSIS — C50912 Malignant neoplasm of unspecified site of left female breast: Secondary | ICD-10-CM

## 2023-12-07 NOTE — Progress Notes (Signed)
 12/07/23 0941   Radiation Therapy Patient Care - Breast    Concurrent Therapy no   Site Left breast   Comfort Alteration   Karnofsky Performance Score 100%-normal, no complaints   Fatigue 1   Pain Score 0   Nutrition Alteration   Anorexia 0-none   Skin Alteration   Radiation Dermatitis 1-faint erythema or dry desquamation   Folliculitis No   Skin Care   (Betamethasone & Cerave)   Emotional Alteration   Coping 0-effective   Vital signs   Temp 97.8 F (36.6 C)   Heart Rate (!) 103   Resp Rate 20   BP (!) 153/97  (Pt hasn't taken BP med this morning yet)     Berdie Ogren, RN

## 2023-12-08 NOTE — Progress Notes (Signed)
 RADIATION ONCOLOGY FOLLOW-UP NOTE    Patient Name: [name]  Date of Birth: Dec 04, 1955  Medical Record Number: [id number]  Encounter Date: 12/07/2023  Radiation Oncology Physician: Lyman Speller, MD    Consulting Physicians and Providers:  Referring physician or provider: Dr. Lindi Adie  Primary care physician or provider: Roselie Skinner, MD  Breast surgical oncology: Dr. Lindi Adie  Medical oncology: Dr. Ladell Heads    DIAGNOSIS:     No diagnosis found.    ASSESSMENT & PLAN:      [name] is a 68 y.o. female  with newly diagnosed LEFT breast cancer s/p mammogram, MRI and biopsy on 06/06/23 revealing cT1b (0.9 cm on MRI) N0 (no IM or axillary nodes on MRI), grade 2, ER+/PR+/HER2- IDC/ILC s/p whole breast RT to 4240 cGy in 16 fx on 11/16/23.     She is overall doing well 2 weeks after radiation.  She still has some hyperpigmentation and peeling of her skin, but she notes that the pain is essentially gone at this point.  She does not have any lymphedema or signs of infection. S'he has not started letrozole yet but understands that she can start it now.    She will continue to follow with Dr. Lindi Adie and Dr. Ladell Heads.  Her next mammogram is in June and I will see her for a follow up then.       ONCOLOGIC DIAGNOSTIC & TREATMENT HISTORY:     Patient has a significant family history of breast cancer and has undergone genetic testing, which is pending.     -05/04/2023: Screening mammogram revealed left breast focal asymmetry and question distortion for which further evaluation was recommended.        -05/14/2023: Diagnostic mammogram and US revealed a left breast 2:30 mass and a left breast 2:00 mass. Biopsy recommended. There is a persistent partially obscured mass in the upper outer quadrant of the  LEFT breast with a few associated punctate calcifications.     US showed that at 2:30, there is a hypoechoic mass measuring 0.6 cm corresponding to calcifications. At 2:00, there is a second mass measuring 0.6 cm.    At 3:00, there is a benign cyst.       [place] of LEFT axilla showed benign appearing lymph nodes.         -06/06/2023: Biopsy of the left breast masses performed. Pathology revealed:                 -Left breast 2:30: usual ductal hyperplasia, columnar cell change and cysts, in a background of dense stromal fibrosis and PASH.                 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 No MRI evidence for malignancy in the contralateral right breast. No axillary lymphadenopathy or internal mammary chain nodes.                -07/04/2023: Scheduled for plastics consult.  -08/06/2023:   Patient underwent LEFT lumpectomy and SLNB. Veraform was placed.  Oncoplasty was done for reconstruction,.     Pathology showed LEFT breast IDC/ILC, moderately differentiated at 0.8 cm, unifocal, grade 2, LVSI-, benign mammary tissue  with  microcysts at 3:00, margins negative (> 1 cm), DCIS not present at margins, skin not involved. 0/2 LN involved.  pT1bN0 ER+ >90%, PR+ 70%, HER2-.  Oncotype 21.     Genetic testing is negative.     HISTORY OF PRESENTATION:     Encounter type: In-person  Interpreter required: No interpreter was required for this visit  [name] presents today alone.    Range of motion issues: No  Extremity edema: No  Any issues healing after surgery: No  Surgical scar dehiscence: No  New mass or adenopathy noted in breast or lymph nodes: No  Previous breast biopsy or surgery: No  Family history of breast cancer or ovarian cancer: No  Genetic testing: Yes        SYSTEMIC THERAPY:     None currently        REVIEW OF SYSTEMS:     A complete 14 point review of systems was performed and was negative with the exception of  pertinent positives detailed here and in the HPI.    Review of Systems        12/07/23 0941   Radiation Therapy Patient Care - Breast    Concurrent Therapy no   Site Left breast   Comfort Alteration   Karnofsky Performance Score 100%-normal, no complaints   Fatigue 1   Pain Score 0   Nutrition Alteration   Anorexia 0-none   Skin Alteration   Radiation Dermatitis 1-faint erythema or dry desquamation   Folliculitis No   Skin Care    (Betamethasone & Cerave)   Emotional Alteration   Coping 0-effective   Vital signs   Temp 97.8 F (36.6 C)   Heart Rate (!) 103   Resp Rate 20   BP (!) 153/97  (Pt hasn't taken BP med this morning yet)       PAST MEDICAL & SURGICAL HISTORY:     Medical History[1]     Past Surgical History[2]   FAMILY HISTORY:     Her family history includes Arthritis in her father; Breast cancer in her sister and other family members; Breast cancer (age of onset: 52) in her paternal cousin; Breast cancer (age of onset: 14) in her paternal cousin; Heart disease in her maternal grandmother; Hypertension in her father and mother; Kidney disease in her brother; Prostate cancer in her paternal grandfather; Stroke in her mother. She She indicated that her mother is deceased. She indicated that her father is alive. She indicated that her sister is deceased. She indicated that her brother is deceased. She indicated that the status of her maternal grandmother is unknown. She indicated that the status of her paternal grandfather is unknown. She indicated that her daughter is alive. She indicated that her son is alive. She indicated that only one of her two others is alive. She indicated that both of her paternal cousins are alive.    Family History   Problem Relation Age of Onset    Hypertension Mother     Stroke Mother     Hypertension Father     Arthritis Father     Breast cancer Sister         Diagnoses in her late 1's, died @62  - metastatic disease    Kidney disease Brother     Heart disease Maternal  Grandmother     Prostate cancer Paternal Grandfather     Breast cancer Other         Maternal great-aunt dx and died in her  70's    Breast cancer Other         Maternal first cousin once removed - daughter of great-uant - dx and died in her 20's    Breast cancer Paternal Cousin 68    Breast cancer Paternal Cousin 85     SOCIAL HISTORY:     Mashal Slavick  reports that she has been smoking cigarettes. She has a 7.5 pack-year smoking history. She has never used smokeless tobacco. She reports current drug use. She reports that she does not drink alcohol.   Social History     Social History Narrative    Caffeine: drinks decaff tea    Diet: tries to stay well balanced    Exercise: not really. 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     Pets: none grand dog    Hobbies:  Grand kids     MEDICATIONS:     Reviewed and updated in EPIC, including:  Current Outpatient Medications   Medication Instructions    albuterol sulfate HFA (PROVENTIL) 108 (90 Base) MCG/ACT inhaler 2 puffs, Inhalation, Every 4 hours PRN, Dispense with spacer    atorvastatin (LIPITOR) 20 mg, Oral, Daily    betamethasone dipropionate (DEL-BETA) 0.05 % cream 1 .application, As needed    ELDERBERRY PO 3,200 mg    famotidine (PEPCID) 40 mg, At bedtime    fluticasone-umeclidinium-vilanterol (Trelegy Ellipta) 100-62.5-25 MCG/ACT Aerosol Pwdr, Breath Activated 1 puff, Inhalation, Daily    letrozole (FEMARA) 2.5 MG tablet TAKE 1 TABLET BY MOUTH DAILY    losartan-hydrochlorothiazide (HYZAAR) 100-12.5 MG per tablet 1 tablet, Oral, Daily    Magnesium 250 mg    Nebulizer Misc Dispense one nebulizer machine to include mask and tubing    vitamin D (cholecalciferol) 25 MCG (1000 UT) tablet 1 tablet    Vitamin E 670 MG (1000 UT) Cap 1 capsule     ALLERGIES:     Allergies[3]    PHYSICAL EXAM:     Vital signs reviewed in EPIC, including:  Vitals:    12/07/23 0941   BP: (!) 153/97   Pulse: (!) 103   Resp: 20   Temp: 97.8 F (36.6 C)   SpO2: 98%     Wt  Readings from Last 3 Encounters:   11/16/23 89.4 kg (197 lb)   10/18/23 88.5 kg (195 lb)   09/21/23 90.4 kg (199 lb 6.4 oz)       Pain: 2/10  KPS: 80 - Normal activity with effort, some signs of symptoms of disease    General: Well-appearing, pleasant female, in no acute distress.  Psychiatric: Alert and oriented x3, thought content appropriate, euthymic.  Head: Normocephalic, without obvious abnormality.  Eyes: Conjunctivae and sclerae clear without icterus. Extraocular movements intact.  Lungs: Work of breathing appears unlabored.  Skin: Skin color appears normal.   Breast: Hyperpigmentation and peeling of skin left breast, region firmness underneath scar consistent with post surgical sequelae, no concerning masses palpated in bilateral breasts or axilla    LABORATORY STUDIES:     Pertinent laboratory studies have been reviewed    Complete Blood Count:  Lab Results   Component Value Date    WBC 6.98 09/22/2023    HGB 13.6 09/22/2023    HCT 41.5 09/22/2023    MCV 94.7 09/22/2023    PLT 269 09/22/2023    NEUTROABS 3.77 09/22/2023     Chemistries:  Lab Results   Component Value Date    CREAT  0.9 09/22/2023    BUN 17 09/22/2023    NA 141 09/22/2023    K 4.3 09/22/2023    CL 106 09/22/2023    CO2 28 09/22/2023    GLU 102 (H) 09/22/2023    HGBA1C 5.8 (H) 05/02/2023    CHOL 199 05/02/2023    TRIG 70 05/02/2023    HDL 46 05/02/2023    LDL 139 (H) 05/02/2023    TSH 0.87 05/02/2023     Liver Function Testing:  Lab Results   Component Value Date    ALT 14 09/22/2023    AST 17 09/22/2023    ALKPHOS 71 09/22/2023    BILITOTAL 0.4 09/22/2023    ALB 3.8 09/22/2023     Coagulation Studies:  Lab Results   Component Value Date    INR 1.0 02/22/2017    PT 13.0 02/22/2017    PTT 32 02/22/2017       RADIOLOGY STUDIES:     Pertinent imaging has been reviewed    PATHOLOGY STUDIES:     Pertinent pathology has been reviewed      ______________________________________________________________________      She verbalized her understanding of  the recommendations discussed and was in agreement with the plan. All of her questions were answered to the best of our abilities and to her apparent satisfaction. She knows to contact us with any questions or concerns. Thank you for allowing [place] to participate in the management and care of this patient.                    Lyman Speller, MD        Attending Radiation Oncologist        Department of Advanced Radiation Oncology & Proton Therapy        [facility]        [address]        [place], [place] [address]             T [phone]  F [phone]        [email or web address]       My total visit time for this patient encounter was 30 minutes. This includes time spent preparing to see the patient, performing the exam, counselling patient/family, ordering and reviewing tests, medications and/or procedures, care coordination, and documenting clinical information in the record.    This note was generated by a voice recognition system and may contain inherent errors or omissions not intended by the user. Grammatical errors, random word insertions, deletions, pronoun errors and incomplete sentences are occasional consequences of this technology due to software limitations. Not all errors are caught or corrected. If there are questions or concerns about the content of this note or information contained within the body of this dictation they should be addressed directly with the author for clarification.        [1]   Past Medical History:  Diagnosis Date    Abnormal vision     glasses    Anxiety     Not on meds    Arthritis     bilateral knees/OA hips/back    Carpal tunnel syndrome on both sides     stiffness noted     Chronic obstructive pulmonary disease (CMS/HCC)     Uses inhalers as prescribed    Difficulty walking     limps    Eczema     has prescription cream    Fracture     finger. resolved,  as a child    Gastroesophageal reflux disease     heartburn recently diet related/some problem  swallowing    History of gonorrhea     teenager    Hx of Hepatitis C     s/p treatment    Hyperlipidemia     Hypertensive disorder     stable meds    Lower back pain     sciatica radiates down LLE: Pain range= 0-10    Malignant neoplasm of left female breast, unspecified estrogen receptor status, unspecified site of breast (CMS/HCC)     Pre-op dx    Neck pain     related to lower back but significant at this time     Neuropathy     numbness upon awakening from low back radiating down LLE .Marland Kitchen. 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 fixed surgically   [2]   Past Surgical History:  Procedure Laterality Date    ARTHROPLASTY, KNEE, TOTAL Left 12/14/2015    Procedure: ARTHROPLASTY, KNEE, TOTAL;  Surgeon: Cynda Familia, MD;  Location: [facility];  Service: Orthopedics;  Laterality: Left;  LEFT TOTAL KNEE ARTHROPLASTY    BIOPSY, BREAST, SENTINEL NODE Left 08/06/2023    Procedure: BIOPSY, BREAST, SENTINEL NODE;  Surgeon: Tilda Burrow, MD;  Location: [facility];  Service: General;  Laterality: Left;    BREAST, LUMPECTOMY Left 08/06/2023    Procedure: LEFT BREAST LUMPECTOMY; LEFT BREAST WIRE LOC; LEFT BREAST 2ND LESION WIRE LOC; LEFT BREAST SENTINEL LYMPH NODE AND  EXCISION AND MAPPING IN OR BY MD;  Surgeon: Tilda Burrow, MD;  Location: [facility];  Service: General;  Laterality: Left;    COLONOSCOPY, DIAGNOSTIC (SCREENING)  2016    X1 polyp    ENDOSCOPIC CARPAL TUNNEL RELEASE Right 03/02/2017    Procedure: ENDOSCOPIC CARPAL TUNNEL RELEASE;  Surgeon: Virgilio Frees., MD;  Location: [facility]  MAIN OR;  Service: Orthopedics;  Laterality: Right;    GANGLION CYST EXCISION Right 2007    x2    INDUCED ABORTION      KNEE ARTHROSCOPY Right 09/24/2014    KNEE ARTHROSCOPY Left 03/2015    LYMPHANGIOGRAM/LYMPHANGIOGRAPHY Left 08/06/2023    Procedure:  LYMPHANGIOGRAM/LYMPHANGIOGRAPHY;  Surgeon: Tilda Burrow, MD;  Location: [facility];  Service: General;  Laterality: Left;    MAMMOPLASTY, REDUCTION, BREAST HYPERTROPHY (MEDICAL) Left 08/06/2023    Procedure: MAMMOPLASTY, REDUCTION, BREAST HYPERTROPHY (MEDICAL);  Surgeon: Lesle Reek, MD;  Location: [facility];  Service: Plastics;  Laterality: Left;    PLACEMENT, PERCUTANEOUS, DEVICE, BREAST LOCALIZATION WITH ULTRASOUND GUIDANCE Left 08/06/2023    Procedure: PLACEMENT, PERCUTANEOUS, DEVICE, BREAST LOCALIZATION WITH ULTRASOUND GUIDANCE;  Surgeon: Tilda Burrow, MD;  Location: [facility];  Service: General;  Laterality: Left;    REPAIR, UPPER EXTREMITY TENDON Right 03/02/2017    Procedure: REPAIR, UPPER EXTREMITY TENDON, EXCISION, GANGLION CYST;  Surgeon: Virgilio Frees., MD;  Location: [facility];  Service: Orthopedics;  Laterality: Right;  right wrist flexor carpi radialis tenosynovectomy w/ possible carpal tunnel release and soft tissue mass excision  q1-unk, asst=n, equip=needle tip bovie, beaver blade, lead hand, micro aire, md req 90 min/ok per JD    REVISION, SCAR LEVEL 1 (MEDICAL) Left 08/06/2023  Procedure: REVISION, SCAR LEVEL1 (MEDICAL);  Surgeon: Lesle Reek, MD;  Location: [facility];  Service: Plastics;  Laterality: Left;  left axilla closure    TONSILLECTOMY      TRANSFER/REARRANGEMENT, TISSUE, ADJACENT, TRUNK Left 08/06/2023    Procedure: TRANSFER/REARRANGEMENT, TISSUE, ADJACENT, TRUNK/BREAST;  Surgeon: Lesle Reek, MD;  Location: [facility];  Service: Plastics;  Laterality: Left;    VAGINAL DELIVERY      X2 spinals ( 6 natural deliveries)   [3]   Allergies  Allergen Reactions    Latex Rash

## 2023-12-10 ENCOUNTER — Other Ambulatory Visit: Payer: Self-pay

## 2023-12-11 NOTE — Progress Notes (Signed)
 RADIATION ONCOLOGY TREATMENT COMPLETION SUMMARY    Patient Name: [name]  Date of Birth: 1956-01-29  Medical Record Number: [id number]  Encounter Date: 11/19/2023  Primary Radiation Oncologist: Nicola Barre, MD    Consulting Physicians and Providers:  Referring physician or provider: Dr. Seretha Dance  Primary care physician or provider: Lendia Quay, MD  Breast surgical oncology: Dr. Seretha Dance  Plastic surgeon: Jill Motes, MD    DIAGNOSIS:     [name] is a 68 y.o. female with L breast cancer. She received 4240 cGy in 16 fractions to the left breast ending on 11/19/23.    RADIOTHERAPY TREATMENT:     Intent: Curative  Modality: Photons  Treatment start date: 10/25/23  Treatment end date: 11/19/23     Treatment Site Total dose prescribed  Total fractions  prescribed Fractional (daily) dose   L breast    4240 cGy 16 265 cGy   Prescribed sum dose: 4240 cGy        SYSTEMIC THERAPY:     Did not receive concurrent systemic therapy    TOXICITY GRADING:      Successful completion of radiotherapy with anticipated side effects without any breaks from treatment.    Over the course of treatment, she experienced the following:    Week 1: Today she says she has a stomach bug so not feeling well. Uses baby lotion to moisturize daily but will switch to cerave     Week 2: Patient reports 6/10 intermittent sharp pains. She takes 500 mg Tylenol  as needed and this is effective for her. There is mild erythema to the L breast with no desquamation. She is moisturizing with baby oil and baby lotion daily. Recommended to apply powder underneath breast.     Week 3: No pain currently but has occasional nerve pain and soreness in her left axilla. Taking tylenol  PRN. She notes increased fatigue this week. She also notes increased itching in left breast. Currently only using Eucerin BID and an unknown powder occasionally under her breast.     Week 4: 3/10 pain to the left breast skin. There is some moderate erythema with no  desquamation. She is applying betamethasone . Patient states she feels a hard lump next to the left nipple and has noticed it this past week.     General/Constitutional:  Fatigue: 1- Fatigue relieved by rest    Breast:  Breast/chest wall pain: 0- None  Breast/chest wall edema: None  Breast/chest wall folliculitis: None    Skin:  Radiation dermatitis: 0- None    Extremity:  Limb edema: 0- None  Joint range of motion decreased: 0- None    Chemo/Hormone therapy related:  Hot flashes: 0- None    Treatment break of at least 3 consecutive days:  -No    RECOMMENDATIONS:     *Pain  -Not applicable, patient not currently in pain    *Skin  2/10: using baby lotion   2/17: Patient will try Cerave    DISPOSITION:     -Follow-up in radiation oncology clinic with APP 1 month  after the completion of radiotherapy  -Discussed follow-up plan  -Follow-up with other physicians and care team providers  -Patient will reach out via MyChart with any questions    Thank you for involving us  in the care of Kenedi Cilia. Please feel free to contact us  with any questions or concerns.  Nicola Barre, MD        Attending Radiation Oncologist        Department of Advanced Radiation Oncology & Proton Therapy        [facility]        [address]        [place], [place] [address]             T [phone]  F [phone]        [email or web address]

## 2023-12-25 ENCOUNTER — Encounter (INDEPENDENT_AMBULATORY_CARE_PROVIDER_SITE_OTHER): Payer: Self-pay

## 2024-01-22 ENCOUNTER — Ambulatory Visit: Payer: No Typology Code available for payment source | Attending: Internal Medicine | Admitting: Internal Medicine

## 2024-01-22 ENCOUNTER — Telehealth: Payer: Self-pay | Admitting: Internal Medicine

## 2024-01-22 ENCOUNTER — Encounter: Payer: Self-pay | Admitting: Internal Medicine

## 2024-01-22 DIAGNOSIS — C50912 Malignant neoplasm of unspecified site of left female breast: Secondary | ICD-10-CM

## 2024-01-22 DIAGNOSIS — Z170 Estrogen receptor positive status [ER+]: Secondary | ICD-10-CM

## 2024-01-22 MED ORDER — EXEMESTANE 25 MG PO TABS
25.0000 mg | ORAL_TABLET | Freq: Every day | ORAL | 5 refills | Status: DC
Start: 2024-01-22 — End: 2024-05-29

## 2024-01-22 NOTE — Telephone Encounter (Signed)
 RTC 3 m

## 2024-01-22 NOTE — Progress Notes (Signed)
 [facility]  [address] [place]  [phone]      [name] Office  [phone]          Visit Date: 01/22/2024    Chief Complaint   Patient presents with    Follow-up     Stopped letrozole           HISTORY OF PRESENT ILLNESS:  Verbal consent obtained to [employer] software to assist in note writing      History of Present Illness  [name] is a 68 year old female with breast cancer who presents with side effects from letrozole .    She completed radiation therapy for breast cancer on November 19, 2023, and began letrozole  in mid-March. Initially well-tolerated, she developed significant side effects after a few weeks, including hot flashes, bone aches, and morning palpitations. She discontinued letrozole  two to three weeks ago, resulting in resolution of palpitations and improvement in bone aches.           Oncology History Overview Note   Physician Requesting Consult: Kathryn Angers, MD    Breast Surgeon: Kathryn Angers, MD  Radiation Oncologist: Kathryn Agee, MD  Plastic Surgeon:   PCP: Kathryn Quay, MD      [name] is a 68 y.o. 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     Bilateral screening mammogram 05/04/2023 showed:   Left breast focal asymmetry and question distortion for which further  evaluation is recommended.    Diagnostic mammogram/ultrasound 05/13/2024 showed:   ULTRASOUND:  Targeted ultrasound of the left lateral breast performed to evaluate the  questioned mammographic finding.       At 2:30 7 cm from the nipple there is an oval circumscribed mildly  hypoechoic mass with internal echogenicities. This is felt to correspond to  the mammographic asymmetry with calcifications. This measures 0.6 x 0.3 x  0.5 cm.      At 3:00 in the periareolar breast there is an incidentally detected  benign-appearing cyst measuring 0.2 x 0.3 x 0.3 cm.     At 2:00 4 cm from the nipple  there is an irregular hypoechoic mass  measuring 0.6 x 0.5 x 0.5 cm felt to be a possible correlate to the  questioned mammographic distortion.     Ultrasound of the left axilla demonstrated benign-appearing lymph nodes  with preserved morphology.     IMPRESSION:         1. Left breast 2:30 mass for which ultrasound-guided core biopsy is  recommended.  2. Left breast 2:00 mass for which ultrasound-guided core biopsy is  recommended. Recommend correlation of clip placement to the mammographic  distortion.        Ultrasound guided biopsy left breast 06/06/2023  showed:  Left Breast 2:30 7 cm FN. Usual ductal hyperplasia, columnar cell change  and cysts, in a background of dense stromal fibrosis and pseudoangiomatous  stromal hyperplasia (PASH).     Left Breast 2:00 4 cm FN.   Invasive mammary carcinoma with ductal and  lobular features (tubule formation 3/3, nuclear pleomorphism 2/3, mitotic  count 1/3), measuring 0.45 cm in maximal length in this material.    ER>90%, PR 70%, HER2 IHC 0, Ki-67 28%       MRI bilateral breasts 06/25/2023 showed:  FINDINGS:  Amount of glandular tissue: There is scattered fibroglandular tissue.  Background parenchymal enhancement:  Mild.     Right breast: No  suspicious enhancement.     Left breast: At the 2:00 position, anterior depth, there is an irregular  enhancing mass measuring 0.5 x 0.7 x 0.9 cm demonstrating persistent type I  enhancement corresponding to biopsy-proven invasive mammary carcinoma.  Biopsy marker is seen adjacent.     No additional suspicious enhancement in the left breast.  Susceptibility artifact from a second biopsy marker is seen in the upper  outer quadrant more posteriorly and inferiorly corresponding to benign  ultrasound-guided biopsy at 2:30 position without suspicious enhancement.     Lymph nodes: No enlarged axillary or internal mammary chain lymph nodes.     IMPRESSION:     1. Biopsy-proven left breast 2:00 mass measuring 0.9 cm. No additional  suspicious  enhancement.  2. No MRI evidence for malignancy in the contralateral right breast.  3. No axillary lymphadenopathy.      S/p left lumpectomy and sentinel node biopsy on 08/06/2023    Final Pathology showed:  -invasive ductal and lobular carcinoma   -pT1b (0.8 cm)  -G2  -pN0 (0/2)  -Margins negative  -LVI absent  -Associated LCIS    Oncotype 21 (7 % risk of systemic recurrence after 5 years of endocrine therapy)        Breast Cancer Risk Factors:  Age at Menarche:  62     Age at 59st FTP:  41   Still menstruating?:  No   Age at last menses: 51  Hysterectomy: No   Estrogen Therapy:          Oral Contraceptives: No           Hormone Replacement Therapy:  No         Reproductive Assistance Therapies:  No  Family History of Cancer:            Breast:   Yes Sister, maternal cousin, and maternal great aunt         Ovarian:No         Colon: No                Prostate:  Yes          Pancreatic:  No         Melanoma: No  Ashkenazi Jewish Ancestry: No       Genetic testing negative       Denies F/C/N/V/diarrhea. No CP/SOB. No HA/visual changes/focal weakness.       Malignant neoplasm of left breast in female, estrogen receptor positive (CMS/HCC)   09/20/2023 Initial Diagnosis    Malignant neoplasm of left breast in female, estrogen receptor positive     09/21/2023 Cancer Staged    Staging form: Breast, AJCC 8th Edition  - Clinical stage from 09/21/2023: Stage IA (cT1b, cN0(sn), cM0, G2, ER+, PR+, HER2-, Oncotype DX score: 21) - Signed by Kathryn Dinning, MD on 09/21/2023          Cancer Staging   Malignant neoplasm of left breast in female, estrogen receptor positive (CMS/HCC)  Staging form: Breast, AJCC 8th Edition  - Clinical stage from 09/21/2023: Stage IA (cT1b, cN0(sn), cM0, G2, ER+, PR+, HER2-, Oncotype DX score: 21) - Signed by Kathryn Dinning, MD on 09/21/2023  Stage prefix: Initial diagnosis  Method of lymph node assessment: Sentinel lymph node biopsy  Multigene prognostic tests performed: Oncotype DX  Recurrence score range:  Greater than or equal to 11  Histologic grading system: 3 grade system  Percentage of positive estrogen receptors (%): 90  Percentage of positive progesterone receptors (%):  70  HER2-IHC interpretation: Negative  HER2-IHC value: Score 0  Ki-67 (%): 28  Oncotype DX recurrence risk level: Low risk      Medical History[1]  Past Surgical History[2]  Social History     Socioeconomic History    Marital status: Single   Tobacco Use    Smoking status: Some Days     Current packs/day: 0.25     Average packs/day: 0.3 packs/day for 30.0 years (7.5 ttl pk-yrs)     Types: Cigarettes    Smokeless tobacco: Never    Tobacco comments:     a pack lasts several days   Vaping Use    Vaping status: Never Used   Substance and Sexual Activity    Alcohol use: No    Drug use: Yes     Comment: Marijuana    Sexual activity: Not Currently   Social History Narrative    Caffeine: drinks decaff tea    Diet: tries to stay well balanced    Exercise: not really. 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     Pets: none grand dog    Hobbies:  Grand kids     Social Drivers of Psychologist, prison and probation services Strain: High Risk (06/18/2023)    Overall Financial Resource Strain (CARDIA)     Difficulty of Paying Living Expenses: Hard   Food Insecurity: No Food Insecurity (06/18/2023)    Hunger Vital Sign     Worried About Running Out of Food in the Last Year: Never true     Ran Out of Food in the Last Year: Never true   Transportation Needs: No Transportation Needs (06/18/2023)    PRAPARE - Therapist, art (Medical): No     Lack of Transportation (Non-Medical): No   Physical Activity: Inactive (06/18/2023)    Exercise Vital Sign     Days of Exercise per Week: 2 days     Minutes of Exercise per Session: 0 min   Stress: Stress Concern Present (06/18/2023)    [employer] of Occupational Health - Occupational Stress Questionnaire     Feeling of Stress : Very much   Social Connections: Moderately Isolated (06/18/2023)     Social Connection and Isolation Panel [NHANES]     Frequency of Communication with Friends and Family: More than three times a week     Frequency of Social Gatherings with Friends and Family: Once a week     Attends Religious Services: More than 4 times per year     Active Member of [employer] or Organizations: No     Attends Banker Meetings: Never     Marital Status: Never married   Intimate Partner Violence: Not At Risk (06/18/2023)    Humiliation, Afraid, Rape, and Kick questionnaire     Fear of Current or Ex-Partner: No     Emotionally Abused: No     Physically Abused: No     Sexually Abused: No   Housing Stability: High Risk (06/18/2023)    Housing Stability Vital Sign     Unable to Pay for Housing in the Last Year: Yes     Number of Times Moved in the Last Year: 0     Homeless in the Last Year: No      Family History   Problem Relation Age of Onset    Hypertension Mother     Stroke Mother     Hypertension Father  Arthritis Father     Breast cancer Sister         Diagnoses in her late 60's, died @62  - metastatic disease    Kidney disease Brother     Heart disease Maternal Grandmother     Prostate cancer Paternal Grandfather     Breast cancer Other         Maternal great-aunt dx and died in her 57's    Breast cancer Other         Maternal first cousin once removed - daughter of great-uant - dx and died in her 44's    Breast cancer Paternal Cousin 46    Breast cancer Paternal Cousin 69     OB History       Gravida   7    Para   6    Term   6    Preterm        AB   1    Living   6         SAB        IAB        Ectopic        Multiple        Live Births                    Allergies[3]  has a current medication list which includes the following prescription(s): betamethasone  dipropionate, elderberry, famotidine , trelegy ellipta , losartan -hydrochlorothiazide , magnesium, vitamin d , vitamin e, albuterol  sulfate hfa, atorvastatin , exemestane, and nebulizer.        REVIEW OF SYSTEMS: 14 pt ROS reviewed with the  patient and negative except for as documented in the HPI. All other systems reviewed.      PHYSICAL EXAM:  BP 118/84 (BP Site: Left arm, Patient Position: Sitting, Cuff Size: Large)   Pulse (!) 107   Temp 99 F (37.2 C) (Oral)   Resp 18   Ht 1.524 m (5')   Wt 88.6 kg (195 lb 6.4 oz)   SpO2 97%   BMI 38.16 kg/m         Performance Status:  0 - Fully active, able to carry on all pre-disease performance without restriction         General Appearance: Alert, cooperative, no distress, appears stated age      Labs Results:  Reviewed in epic today  Lab Results   Component Value Date    WBC 6.98 09/22/2023    HGB 13.6 09/22/2023    HCT 41.5 09/22/2023    MCV 94.7 09/22/2023    PLT 269 09/22/2023     Lab Results   Component Value Date    CREAT 0.9 09/22/2023    BUN 17 09/22/2023    NA 141 09/22/2023    K 4.3 09/22/2023    CL 106 09/22/2023    CO2 28 09/22/2023     Lab Results   Component Value Date    ALT 14 09/22/2023    AST 17 09/22/2023    ALKPHOS 71 09/22/2023    BILITOTAL 0.4 09/22/2023       Imaging Results:   Reviewed:   DXA 09/27/2023     Malignant neoplasm of left breast in female, estrogen receptor positive  68 yo female with 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 Initially well-tolerated, she developed significant side effects after a few weeks, including hot flashes, bone aches, and morning palpitations. She discontinued letrozole  two to three weeks ago, resulting in resolution of palpitations and improvement in bone aches.     Stop letrozole      To see her cardiologist for w/u palpitations then start exemestane       RTC 3 m       Osteopenia   Baseline DXA 09/27/2023 showed osteopenia  with lowest T score -1.5 hip and fem neck     10-Year Fracture Risk(1):  -----------------------------------------------------------------  Major Osteoporotic Fracture            3.6%  Hip Fracture                           0.6%  -----------------------------------------------------------------    Continue D3 and calcium  rich food   Recommended WBE     Donis Furnish, MD      Level 4 f/u  Breast cancer management  Long term medication management  Risk for recurrence  endocrine therapy counseling including risks / benefits / side effects / bone health and monitoring of bone health and bone density.             [1]   Past Medical History:  Diagnosis Date    Abnormal vision     glasses    Anxiety     Not on meds    Arthritis     bilateral knees/OA hips/back    Carpal tunnel syndrome on both sides     stiffness noted     Chronic obstructive pulmonary disease (CMS/HCC)     Uses inhalers as prescribed    Difficulty walking     limps    Eczema     has prescription cream    Fracture     finger. resolved, as a child    Gastroesophageal reflux disease     heartburn recently diet related/some problem swallowing    History of gonorrhea     teenager    Hx of Hepatitis C     s/p treatment    Hyperlipidemia     Hypertensive disorder     stable meds    Lower back pain     sciatica radiates down LLE: Pain range= 0-10    Malignant neoplasm of left female breast, unspecified estrogen receptor status, unspecified site of breast (CMS/HCC)     Pre-op dx    Neck pain     related to lower back but significant at this time     Neuropathy     numbness upon awakening from low back radiating down LLE .Aaron Aas. 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 fixed surgically   [2]   Past Surgical History:  Procedure Laterality Date    ARTHROPLASTY, KNEE, TOTAL Left 12/14/2015    Procedure: ARTHROPLASTY, KNEE, TOTAL;   Surgeon: Adonis Hoot, MD;  Location: [facility];  Service: Orthopedics;  Laterality: Left;  LEFT TOTAL KNEE ARTHROPLASTY    BIOPSY, BREAST, SENTINEL NODE Left  08/06/2023    Procedure: BIOPSY, BREAST, SENTINEL NODE;  Surgeon: Kathryn Angers, MD;  Location: [facility];  Service: General;  Laterality: Left;    BREAST, LUMPECTOMY Left 08/06/2023    Procedure: LEFT BREAST LUMPECTOMY; LEFT BREAST WIRE LOC; LEFT BREAST 2ND LESION WIRE LOC; LEFT BREAST SENTINEL LYMPH NODE AND  EXCISION AND MAPPING IN OR BY MD;  Surgeon: Kathryn Angers, MD;  Location: [facility];  Service: General;  Laterality: Left;    COLONOSCOPY, DIAGNOSTIC (SCREENING)  2016    X1 polyp    ENDOSCOPIC CARPAL TUNNEL RELEASE Right 03/02/2017    Procedure: ENDOSCOPIC CARPAL TUNNEL RELEASE;  Surgeon: Alvin Jones., MD;  Location: [facility];  Service: Orthopedics;  Laterality: Right;    GANGLION CYST EXCISION Right 2007    x2    INDUCED ABORTION      KNEE ARTHROSCOPY Right 09/24/2014    KNEE ARTHROSCOPY Left 03/2015    LYMPHANGIOGRAM/LYMPHANGIOGRAPHY Left 08/06/2023    Procedure: LYMPHANGIOGRAM/LYMPHANGIOGRAPHY;  Surgeon: Kathryn Angers, MD;  Location: [facility];  Service: General;  Laterality: Left;    MAMMOPLASTY, REDUCTION, BREAST HYPERTROPHY (MEDICAL) Left 08/06/2023    Procedure: MAMMOPLASTY, REDUCTION, BREAST HYPERTROPHY (MEDICAL);  Surgeon: Frankey Isle, MD;  Location: [facility];  Service: Plastics;  Laterality: Left;    PLACEMENT, PERCUTANEOUS, DEVICE, BREAST LOCALIZATION WITH ULTRASOUND GUIDANCE Left 08/06/2023    Procedure: PLACEMENT, PERCUTANEOUS, DEVICE, BREAST LOCALIZATION WITH ULTRASOUND GUIDANCE;  Surgeon: Kathryn Angers, MD;  Location: [facility];  Service: General;  Laterality: Left;    REPAIR, UPPER EXTREMITY TENDON Right 03/02/2017    Procedure: REPAIR, UPPER EXTREMITY TENDON, EXCISION, GANGLION CYST;  Surgeon: Alvin Jones., MD;   Location: [facility];  Service: Orthopedics;  Laterality: Right;  right wrist flexor carpi radialis tenosynovectomy w/ possible carpal tunnel release and soft tissue mass excision  q1-unk, asst=n, equip=needle tip bovie, beaver blade, lead hand, micro aire, md req 90 min/ok per JD    REVISION, SCAR LEVEL 1 (MEDICAL) Left 08/06/2023    Procedure: REVISION, SCAR LEVEL1 (MEDICAL);  Surgeon: Frankey Isle, MD;  Location: [facility];  Service: Plastics;  Laterality: Left;  left axilla closure    TONSILLECTOMY      TRANSFER/REARRANGEMENT, TISSUE, ADJACENT, TRUNK Left 08/06/2023    Procedure: TRANSFER/REARRANGEMENT, TISSUE, ADJACENT, TRUNK/BREAST;  Surgeon: Frankey Isle, MD;  Location: [facility];  Service: Plastics;  Laterality: Left;    VAGINAL DELIVERY      X2 spinals ( 6 natural deliveries)   [3]   Allergies  Allergen Reactions    Latex Rash

## 2024-01-24 ENCOUNTER — Encounter (INDEPENDENT_AMBULATORY_CARE_PROVIDER_SITE_OTHER): Payer: Self-pay

## 2024-02-12 NOTE — Progress Notes (Signed)
 Subjective:       Patient ID: [name] is a 68 y.o. female.    HPI  Patient is here for follow up for her history of left breast cancer. She will start Aromasin  today.     Today she reports intermittent left breast pain since her surgery. No right breast pain. She reports new itchy brown dots on her left breast in the UOQ. No right breast skin changes. She denies palpable masses, nipple changes, and nipple discharge bilaterally.     She reports not being able to afford a diagnostic mammogram as it costs $150.00. We discussed undergoing a screening mammogram now and diagnostic mammogram as needed.     She reports no change in her medical history and family history since her last visit.     Her sister had breast cancer at age 25.    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 No additional  suspicious enhancement.  2. No MRI evidence for malignancy in the contralateral right breast.  3. No axillary lymphadenopathy.    Treatment Team  Ref Prov:  Primary Care Physician:   Hegde, Dinraj, MD Radiology facility: [facility])--[phone] Breast Surgeon : Priscilla Boards MD  [phone]   Plastic Surgeon Oneil Gust MD [phone] Radiation Onc:  Lauraine Jock, MD Medical Onc:  Jon Ober, MD      Breast Cancer Comprehensive Care Plan Summary  Date of diagnosis: 06/06/23 Event : Primary Breast Cancer ECOG Performance: Grade 0   (Fully active) Genetic Testing :21-gene BRCANext-Expanded with RNAinsight     Presentation : abnormal mammogram with mass Side: left Focality :  unifocal Location: radian 2:30   Biologic Tumor characteristics  Tumor: Invasive Mammary Carcinoma with ductal and lobular Grade: nuclear grade II  Ki-67:  28 % Oncotype Dx:  Score: 21   Estrogen Receptor: positive >90 % Progesterone Receptor: positive 70 % Her-2-neu Receptor: 0 , negative    Staging  Tumor Size:   0.8 cm Nodes: 0 sentinel nodes out of 2 Systemic Metastases: clinically negative Stage:  Stage IA, T1 N0 M0   Treatment  Modality Date    Surgery   08/06/23 left, partial mastectomy, sentinel node biopsy, and oncoplastic reconstruction/mammoplasty   Radiation completion date:  11/19/2023 Adjuvant radiation   Endocrine Start date: 11/2023  Anticipated duration:  5-10 years   Aromasin - Stopped April 2025 due to side effects.    Chemotherapy Completion date: not indicated     Biologic  not indicated   Clinical trial         The following portions of the patient's history were reviewed and updated as appropriate: allergies, current medications, past family history, past medical history, past social history, past surgical history, and problem list.    Review of Systems   Constitutional: Negative.    HEENT:  Negative.     Respiratory: Negative.     Cardiovascular: Negative.    Gastrointestinal: Negative.    Endocrine: Negative.    GU/GYN: Negative.  Musculoskeletal: Negative.    Skin: Negative.    Neurological: Negative.    Hematological: Negative.    Psychiatric/Behavioral: Negative.     All other systems reviewed and are negative.        Objective:    Physical Exam  Pulmonary:      Effort: Pulmonary effort is normal.   Chest:   Breasts:     Right: No inverted nipple, mass, nipple discharge, skin change or tenderness.      Left: Skin change present. No inverted nipple, mass, nipple discharge or tenderness.          Comments: Multiple dark brown papules in the UOQ of  the left breast resembling pimples.   Musculoskeletal:         General: Normal range of motion.      Cervical back: Normal range of motion.   Lymphadenopathy:      Upper Body:      Right upper body: No supraclavicular adenopathy.      Left upper body: No supraclavicular adenopathy.   Skin:     General: Skin is warm and dry.   Neurological:      Mental Status: She is alert and oriented to person, place, and time.   Psychiatric:         Behavior: Behavior normal.       Assessment:       Left 2:00 Cancer  Left 230 benign  New skin lesions (likely folliculitis if the breast)      Plan:      Procedures  Referral to dermatology.   Apply OTC cortisone and neosporin cream to skin lesions BID x 10 days. Stop if it makes it worse and call the office.  Annual mammogram now. Provided screening mammogram order as patient is unable to afford a diagnostic mammogram.  Follow up with medical oncology.   Follow up with cardiology per the request of medical oncology.  Follow up in 6 months for clinical exam.  Call the office with questions and concerns.

## 2024-02-18 ENCOUNTER — Encounter: Payer: Self-pay | Admitting: Registered Nurse

## 2024-02-19 ENCOUNTER — Encounter: Payer: Self-pay | Admitting: Registered Nurse

## 2024-02-19 ENCOUNTER — Ambulatory Visit: Payer: No Typology Code available for payment source | Attending: Surgery | Admitting: Registered Nurse

## 2024-02-19 VITALS — BP 142/96 | HR 106 | Ht 60.0 in | Wt 196.0 lb

## 2024-02-19 DIAGNOSIS — Z853 Personal history of malignant neoplasm of breast: Secondary | ICD-10-CM

## 2024-02-19 DIAGNOSIS — L988 Other specified disorders of the skin and subcutaneous tissue: Secondary | ICD-10-CM

## 2024-02-19 NOTE — Patient Instructions (Signed)
 Avelina Lucy-Dermatologist  [phone]

## 2024-03-10 ENCOUNTER — Ambulatory Visit: Payer: Self-pay

## 2024-03-13 NOTE — Progress Notes (Signed)
 [facility]    CC: New patient, Total body skin examination (TBSE)    HISTORY OF PRESENT ILLNESS  [name] is 67 y.o. female who comes in at the referral of Dr Margaretha for evaluation of some skin lesions on the left breast after surgery and radiation as treatment for her invasive ductal and lobular carcinoma  early this year. Asymptomatic per patient. Also would Total body skin examination (TBSE)    Denies any personal history of skin cancer. Denies history of extensive sun exposure. Denies history of blistering sun Sch[place]t. Denies history of tanning bed use.  Denies family history of melanoma.    Today patient denies any new, changing, itching, peeling, bleeding, painful lesions. Feels well overall.    Stated he has never had TBSE.       Current Medications[1]  Allergies[2]  Medical History[3]  Past Surgical History[4]  Family History[5]  Social History[6]    REVIEW OF SYSTEMS  Const - no fever no chills   Resp- no cough   Neuro - no headache   Abd - no pain   Integ - see HPI    All other systems reviewed and negative    Objective:     Vitals:    03/17/24 1537   BP: 119/81   Pulse: (!) 110   Resp: 16   Temp: 98 F (36.7 C)   SpO2: 97%     Constitutional: General appearance: NAD, conversant     HEENT: normocephalic, atraumatic. Conjunctiva, lids, lips, examined with no suspicious lesions     SKIN:A total body skin examination is completed including:     Head including face - no lesions of concern  Neck - no lesions of concern   Chest including breasts, and axillae - no lesions of concern   Abdomen - no lesions of concern   Genitalia, groin, buttocks -no lesions of concern  Back - no lesions of concern  Right upper extremity - no lesions of concern   Left upper extremity - no lesions of concern   Right lower extremity - no lesions of concern   Left lower extremity - no lesions of concern     - Dermatoscopy was used to evaluate the nevi.   - General skin: multiple  brown macules, size 2-48mm, ~ 20 nevi, distributed primarily on the trunk. Mild photodamage.    LYMPHATIC:Examination of lymph node basins is completed, including the H and N, axilla and groin. There is no lymphadenopathy.     NECK: supple, no masses     NEURO/PSYCH: alert and oriented, pleasant    Pathology reports   No skin pathology available    Assessment & Plan:   1. Multiple benign nevi   -Reassured patient of benign appearance    2. Skin cancer screening  - Total body skin examination performed today    - No lesions concerning to warrant biopsy today    - Sun protection reviewed    - ABCDEs of melanoma    - Self-skin examination was advised and the patient was instructed to return to clinic prior to scheduled visit if any suspicious or worrisome findings are discovered    3. Safe sun practices reviewed including:  -Use of sunscreen and application, need for reapplication  -Sun protective clothing including hats, sunglasses and other    Recommend TBSE every 1-2 years     Asberry Cave, MSN, FNP-BC  Nurse Practitioner, [facility]  [facility]  [facility]  [address]  [place], [place] [address]  [phone]           [1]   Current Outpatient Medications   Medication Sig Dispense Refill    albuterol  sulfate HFA (PROVENTIL ) 108 (90 Base) MCG/ACT inhaler Inhale 2 puffs into the lungs every 4 (four) hours as needed for Wheezing or Shortness of Breath Dispense with spacer (Patient not taking: Reported on 02/19/2024) 1 each 0    atorvastatin  (LIPITOR) 20 MG tablet Take 1 tablet (20 mg) by mouth daily 90 tablet 3    betamethasone  dipropionate (DEL-BETA) 0.05 % cream Apply 1 .application topically as needed      ELDERBERRY PO Take 3,200 mg by mouth      exemestane  (AROMASIN ) 25 MG tablet Take 1 tablet (25 mg) by mouth once daily 30 tablet 5    famotidine  (PEPCID ) 40 MG tablet Take 1 tablet (40 mg) by mouth nightly       fluticasone-umeclidinium-vilanterol (Trelegy Ellipta ) 100-62.5-25 MCG/ACT Aerosol Pwdr, Breath Activated Inhale 1 puff into the lungs daily 1 each 0    losartan -hydrochlorothiazide  (HYZAAR) 100-12.5 MG per tablet Take 1 tablet by mouth daily 90 tablet 3    Magnesium 250 MG Tab Take 1 tablet (250 mg) by mouth      Nebulizer Misc Dispense one nebulizer machine to include mask and tubing (Patient not taking: Reported on 02/19/2024) 1 each 0    vitamin D  (cholecalciferol ) 25 MCG (1000 UT) tablet 1 tablet (25 mcg)      Vitamin E 670 MG (1000 UT) Cap 1 capsule       No current facility-administered medications for this visit.   [2]   Allergies  Allergen Reactions    Latex Rash   [3]   Past Medical History:  Diagnosis Date    Abnormal vision     glasses    Anxiety     Not on meds    Arthritis     bilateral knees/OA hips/back    Carpal tunnel syndrome on both sides     stiffness noted     Chronic obstructive pulmonary disease (CMS/HCC)     Uses inhalers as prescribed    Difficulty walking     limps    Eczema     has prescription cream    Fracture     finger. resolved, as a child    Gastroesophageal reflux disease     heartburn recently diet related/some problem swallowing    History of gonorrhea     teenager    Hx of Hepatitis C     s/p treatment    Hyperlipidemia     Hypertensive disorder     stable meds    Lower back pain     sciatica radiates down LLE: Pain range= 0-10    Malignant neoplasm of left female breast, unspecified estrogen receptor status, unspecified site of breast (CMS/HCC)     Pre-op dx    Neck pain     related to lower back but significant at this time     Neuropathy     numbness upon awakening from low back radiating down LLE .SABRA. 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 fixed surgically   [4]  Past Surgical History:  Procedure Laterality Date    ARTHROPLASTY, KNEE, TOTAL  Left 12/14/2015    Procedure: ARTHROPLASTY, KNEE, TOTAL;  Surgeon: Fermin Oneil SAUNDERS, MD;  Location: [facility];  Service: Orthopedics;  Laterality: Left;  LEFT TOTAL KNEE ARTHROPLASTY    BIOPSY, BREAST, SENTINEL NODE Left 08/06/2023    Procedure: BIOPSY, BREAST, SENTINEL NODE;  Surgeon: Roselind Garner, MD;  Location: [facility];  Service: General;  Laterality: Left;    BREAST, LUMPECTOMY Left 08/06/2023    Procedure: LEFT BREAST LUMPECTOMY; LEFT BREAST WIRE LOC; LEFT BREAST 2ND LESION WIRE LOC; LEFT BREAST SENTINEL LYMPH NODE AND  EXCISION AND MAPPING IN OR BY MD;  Surgeon: Roselind Garner, MD;  Location: [facility];  Service: General;  Laterality: Left;    COLONOSCOPY, DIAGNOSTIC (SCREENING)  2016    X1 polyp    ENDOSCOPIC CARPAL TUNNEL RELEASE Right 03/02/2017    Procedure: ENDOSCOPIC CARPAL TUNNEL RELEASE;  Surgeon: Glendia Gilmore Nancyann Mickey., MD;  Location: [facility];  Service: Orthopedics;  Laterality: Right;    GANGLION CYST EXCISION Right 2007    x2    INDUCED ABORTION      KNEE ARTHROSCOPY Right 09/24/2014    KNEE ARTHROSCOPY Left 03/2015    LYMPHANGIOGRAM/LYMPHANGIOGRAPHY Left 08/06/2023    Procedure: LYMPHANGIOGRAM/LYMPHANGIOGRAPHY;  Surgeon: Roselind Garner, MD;  Location: [facility];  Service: General;  Laterality: Left;    MAMMOPLASTY, REDUCTION, BREAST HYPERTROPHY (MEDICAL) Left 08/06/2023    Procedure: MAMMOPLASTY, REDUCTION, BREAST HYPERTROPHY (MEDICAL);  Surgeon: Lindley Oneil CROME, MD;  Location: [facility];  Service: Plastics;  Laterality: Left;    PLACEMENT, PERCUTANEOUS, DEVICE, BREAST LOCALIZATION WITH ULTRASOUND GUIDANCE Left 08/06/2023    Procedure: PLACEMENT, PERCUTANEOUS, DEVICE, BREAST LOCALIZATION WITH ULTRASOUND GUIDANCE;  Surgeon: Roselind Garner, MD;  Location: [facility];  Service: General;  Laterality: Left;    REPAIR, UPPER EXTREMITY TENDON Right 03/02/2017    Procedure: REPAIR, UPPER EXTREMITY TENDON, EXCISION,  GANGLION CYST;  Surgeon: Glendia Gilmore Nancyann Mickey., MD;  Location:  MAIN OR;  Service: Orthopedics;  Laterality: Right;  right wrist flexor carpi radialis tenosynovectomy w/ possible carpal tunnel release and soft tissue mass excision  q1-unk, asst=n, equip=needle tip bovie, beaver blade, lead hand, micro aire, md req 90 min/ok per JD    REVISION, SCAR LEVEL 1 (MEDICAL) Left 08/06/2023    Procedure: REVISION, SCAR LEVEL1 (MEDICAL);  Surgeon: Lindley Oneil CROME, MD;  Location: [facility];  Service: Plastics;  Laterality: Left;  left axilla closure    TONSILLECTOMY      TRANSFER/REARRANGEMENT, TISSUE, ADJACENT, TRUNK Left 08/06/2023    Procedure: TRANSFER/REARRANGEMENT, TISSUE, ADJACENT, TRUNK/BREAST;  Surgeon: Lindley Oneil CROME, MD;  Location: [facility];  Service: Plastics;  Laterality: Left;    VAGINAL DELIVERY      X2 spinals ( 6 natural deliveries)   [5]   Family History  Problem Relation Name Age of Onset    Hypertension Mother      Stroke Mother      Hypertension Father      Arthritis Father      Breast cancer Sister          Diagnoses in her late 55's, died @62  - metastatic disease    Kidney disease Brother      Heart disease Maternal Grandmother      Prostate cancer Paternal Grandfather      Breast cancer Other  Maternal great-aunt dx and died in her 17's    Breast cancer Other          Maternal first cousin once removed - daughter of great-uant - dx and died in her 58's    Breast cancer Paternal Cousin  67    Breast cancer Paternal Cousin  43   [6]   Social History  Socioeconomic History    Marital status: Single   Tobacco Use    Smoking status: Some Days     Current packs/day: 0.25     Average packs/day: 0.3 packs/day for 30.0 years (7.5 ttl pk-yrs)     Types: Cigarettes    Smokeless tobacco: Never    Tobacco comments:     a pack lasts several days   Vaping Use    Vaping status: Never Used   Substance and Sexual Activity    Alcohol use: No    Drug use: Yes     Comment:  Marijuana    Sexual activity: Not Currently   Social History Narrative    Caffeine: drinks decaff tea    Diet: tries to stay well balanced    Exercise: not really. 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     Pets: none grand dog    Hobbies:  Grand kids     Social Drivers of Psychologist, prison and probation services Strain: High Risk (06/18/2023)    Overall Financial Resource Strain (CARDIA)     Difficulty of Paying Living Expenses: Hard   Food Insecurity: No Food Insecurity (06/18/2023)    Hunger Vital Sign     Worried About Running Out of Food in the Last Year: Never true     Ran Out of Food in the Last Year: Never true   Transportation Needs: No Transportation Needs (06/18/2023)    PRAPARE - Therapist, art (Medical): No     Lack of Transportation (Non-Medical): No   Physical Activity: Inactive (06/18/2023)    Exercise Vital Sign     Days of Exercise per Week: 2 days     Minutes of Exercise per Session: 0 min   Stress: Stress Concern Present (06/18/2023)    [employer] of Occupational Health - Occupational Stress Questionnaire     Feeling of Stress : Very much   Social Connections: Moderately Isolated (06/18/2023)    Social Connection and Isolation Panel     Frequency of Communication with Friends and Family: More than three times a week     Frequency of Social Gatherings with Friends and Family: Once a week     Attends Religious Services: More than 4 times per year     Active Member of [employer] or Organizations: No     Attends Banker Meetings: Never     Marital Status: Never married   Intimate Partner Violence: Not At Risk (06/18/2023)    Humiliation, Afraid, Rape, and Kick questionnaire     Fear of Current or Ex-Partner: No     Emotionally Abused: No     Physically Abused: No     Sexually Abused: No   Housing Stability: High Risk (06/18/2023)    Housing Stability Vital Sign     Unable to Pay for Housing in the Last Year: Yes     Number of Times Moved in the Last Year: 0      Homeless in the Last Year: No

## 2024-03-17 ENCOUNTER — Encounter: Payer: Self-pay | Admitting: Family

## 2024-03-17 ENCOUNTER — Ambulatory Visit: Attending: Student in an Organized Health Care Education/Training Program | Admitting: Family

## 2024-03-17 VITALS — BP 119/81 | HR 110 | Temp 98.0°F | Resp 16 | Ht 60.0 in | Wt 197.3 lb

## 2024-03-17 DIAGNOSIS — L821 Other seborrheic keratosis: Secondary | ICD-10-CM | POA: Insufficient documentation

## 2024-03-17 DIAGNOSIS — Z09 Encounter for follow-up examination after completed treatment for conditions other than malignant neoplasm: Secondary | ICD-10-CM | POA: Insufficient documentation

## 2024-03-17 DIAGNOSIS — D229 Melanocytic nevi, unspecified: Secondary | ICD-10-CM | POA: Insufficient documentation

## 2024-03-17 DIAGNOSIS — Z1283 Encounter for screening for malignant neoplasm of skin: Secondary | ICD-10-CM | POA: Insufficient documentation

## 2024-03-31 ENCOUNTER — Other Ambulatory Visit: Payer: Self-pay | Admitting: Registered Nurse

## 2024-04-03 ENCOUNTER — Other Ambulatory Visit: Payer: Self-pay | Admitting: Registered Nurse

## 2024-04-03 ENCOUNTER — Ambulatory Visit: Payer: Self-pay | Admitting: Registered Nurse

## 2024-04-03 ENCOUNTER — Other Ambulatory Visit (INDEPENDENT_AMBULATORY_CARE_PROVIDER_SITE_OTHER): Payer: Self-pay | Admitting: Registered Nurse

## 2024-04-03 DIAGNOSIS — R928 Other abnormal and inconclusive findings on diagnostic imaging of breast: Secondary | ICD-10-CM

## 2024-04-10 ENCOUNTER — Ambulatory Visit: Payer: Self-pay | Admitting: Nurse Practitioner

## 2024-04-10 ENCOUNTER — Other Ambulatory Visit (INDEPENDENT_AMBULATORY_CARE_PROVIDER_SITE_OTHER): Payer: Self-pay | Admitting: Registered Nurse

## 2024-04-16 ENCOUNTER — Other Ambulatory Visit: Payer: Self-pay | Admitting: Registered Nurse

## 2024-04-16 ENCOUNTER — Ambulatory Visit: Payer: Self-pay | Admitting: Registered Nurse

## 2024-05-28 NOTE — Progress Notes (Signed)
 [facility]  [address] [place]  [phone]      [place] Office  [phone]          Visit Date: 05/29/2024    Chief Complaint   Patient presents with    Follow-up     Continuation of managed care for breast cancer 3/10  breast tightness when she turns from side to side          HISTORY OF PRESENT ILLNESS:  Verbal consent obtained to Abridge software to assist in note writing      History of Present Illness    [name] is a 68 year old female with invasive breast cancer who presents to discuss side effects from endocrine therapy.   She took exemestane  x 3 weeks and then stopped because of significant side effects from her current medication regimen, including mood changes, hot flushes, aching and generalized malaise. Off exemestane  for almost 3 months.     She has significant anxiety, exacerbated by a recent breast examination scare of abnormality on mammogram requiring a biopsy (returned benign).          Oncology History Overview Note   Physician Requesting Consult: Roselind Garner, MD    Breast Surgeon: Garner Roselind, MD  Radiation Oncologist: Lauraine Jock, MD  Plastic Surgeon:   PCP: Rosalynd Star, MD      [name] is a 68 y.o. 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     Bilateral screening mammogram 05/04/2023 showed:   Left breast focal asymmetry and question distortion for which further  evaluation is recommended.    Diagnostic mammogram/ultrasound 05/13/2024 showed:   ULTRASOUND:  Targeted ultrasound of the left lateral breast performed to evaluate the  questioned mammographic finding.       At 2:30 7 cm from the nipple there is an oval circumscribed mildly  hypoechoic mass with internal echogenicities. This is felt to correspond to  the mammographic asymmetry with calcifications. This measures 0.6 x 0.3 x  0.5 cm.      At 3:00 in the periareolar breast there is  an incidentally detected  benign-appearing cyst measuring 0.2 x 0.3 x 0.3 cm.     At 2:00 4 cm from the nipple there is an irregular hypoechoic mass  measuring 0.6 x 0.5 x 0.5 cm felt to be a possible correlate to the  questioned mammographic distortion.     Ultrasound of the left axilla demonstrated benign-appearing lymph nodes  with preserved morphology.     IMPRESSION:         1. Left breast 2:30 mass for which ultrasound-guided core biopsy is  recommended.  2. Left breast 2:00 mass for which ultrasound-guided core biopsy is  recommended. Recommend correlation of clip placement to the mammographic  distortion.        Ultrasound guided biopsy left breast 06/06/2023  showed:  Left Breast 2:30 7 cm FN. Usual ductal hyperplasia, columnar cell change  and cysts, in a background of dense stromal fibrosis and pseudoangiomatous  stromal hyperplasia (PASH).     Left Breast 2:00 4 cm FN.   Invasive mammary carcinoma with ductal and  lobular features (tubule formation 3/3, nuclear pleomorphism 2/3, mitotic  count 1/3), measuring 0.45 cm in maximal length in this material.    ER>90%, PR 70%, HER2 IHC 0, Ki-67 28%       MRI bilateral breasts 06/25/2023 showed:  FINDINGS:  Amount of glandular tissue: There is scattered fibroglandular tissue.  Background parenchymal enhancement:  Mild.     Right breast: No suspicious enhancement.     Left breast: At the 2:00 position, anterior depth, there is an irregular  enhancing mass measuring 0.5 x 0.7 x 0.9 cm demonstrating persistent type I  enhancement corresponding to biopsy-proven invasive mammary carcinoma.  Biopsy marker is seen adjacent.     No additional suspicious enhancement in the left breast.  Susceptibility artifact from a second biopsy marker is seen in the upper  outer quadrant more posteriorly and inferiorly corresponding to benign  ultrasound-guided biopsy at 2:30 position without suspicious enhancement.     Lymph nodes: No enlarged axillary or internal mammary chain lymph  nodes.     IMPRESSION:     1. Biopsy-proven left breast 2:00 mass measuring 0.9 cm. No additional  suspicious enhancement.  2. No MRI evidence for malignancy in the contralateral right breast.  3. No axillary lymphadenopathy.      S/p left lumpectomy and sentinel node biopsy on 08/06/2023    Final Pathology showed:  -invasive ductal and lobular carcinoma   -pT1b (0.8 cm)  -G2  -pN0 (0/2)  -Margins negative  -LVI absent  -Associated LCIS    Oncotype 21 (7 % risk of systemic recurrence after 5 years of endocrine therapy)        Breast Cancer Risk Factors:  Age at Menarche:  59     Age at 70st FTP:  70   Still menstruating?:  No   Age at last menses: 51  Hysterectomy: No   Estrogen Therapy:          Oral Contraceptives: No           Hormone Replacement Therapy:  No         Reproductive Assistance Therapies:  No  Family History of Cancer:            Breast:   Yes Sister, maternal cousin, and maternal great aunt         Ovarian:No         Colon: No                Prostate:  Yes          Pancreatic:  No         Melanoma: No  Ashkenazi Jewish Ancestry: No       Genetic testing negative       Denies F/C/N/V/diarrhea. No CP/SOB. No HA/visual changes/focal weakness.       Malignant neoplasm of left breast in female, estrogen receptor positive (CMS/HCC)   09/20/2023 Initial Diagnosis    Malignant neoplasm of left breast in female, estrogen receptor positive     09/21/2023 Cancer Staged    Staging form: Breast, AJCC 8th Edition  - Clinical stage from 09/21/2023: Stage IA (cT1b, cN0(sn), cM0, G2, ER+, PR+, HER2-, Oncotype DX score: 21) - Signed by Arne Mungo, MD on 09/21/2023          Cancer Staging   Malignant neoplasm of left breast in female, estrogen receptor positive (CMS/HCC)  Staging form: Breast, AJCC 8th Edition  - Clinical stage from 09/21/2023: Stage IA (cT1b, cN0(sn), cM0, G2, ER+, PR+, HER2-, Oncotype DX score: 21) - Signed by Arne Mungo, MD on 09/21/2023  Stage prefix: Initial diagnosis  Method of lymph node assessment:  Sentinel lymph node biopsy  Multigene prognostic tests performed: Oncotype DX  Recurrence score range: Greater than or equal  to 11  Histologic grading system: 3 grade system  Percentage of positive estrogen receptors (%): 90  Percentage of positive progesterone receptors (%): 70  HER2-IHC interpretation: Negative  HER2-IHC value: Score 0  Ki-67 (%): 28  Oncotype DX recurrence risk level: Low risk      Medical History[1]  Past Surgical History[2]  Social History     Socioeconomic History    Marital status: Single   Tobacco Use    Smoking status: Some Days     Current packs/day: 0.25     Average packs/day: 0.3 packs/day for 30.0 years (7.5 ttl pk-yrs)     Types: Cigarettes    Smokeless tobacco: Never    Tobacco comments:     a pack lasts several days   Vaping Use    Vaping status: Never Used   Substance and Sexual Activity    Alcohol use: No    Drug use: Yes     Comment: Marijuana    Sexual activity: Not Currently   Social History Narrative    Caffeine: drinks decaff tea    Diet: tries to stay well balanced    Exercise: not really. 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     Pets: none grand dog    Hobbies:  Grand kids     Social Drivers of Psychologist, prison and probation services Strain: High Risk (06/18/2023)    Overall Financial Resource Strain (CARDIA)     Difficulty of Paying Living Expenses: Hard   Food Insecurity: No Food Insecurity (06/18/2023)    Hunger Vital Sign     Worried About Running Out of Food in the Last Year: Never true     Ran Out of Food in the Last Year: Never true   Transportation Needs: No Transportation Needs (06/18/2023)    PRAPARE - Therapist, art (Medical): No     Lack of Transportation (Non-Medical): No   Physical Activity: Inactive (06/18/2023)    Exercise Vital Sign     Days of Exercise per Week: 2 days     Minutes of Exercise per Session: 0 min   Stress: Stress Concern Present (06/18/2023)    [employer] of Occupational Health - Occupational Stress  Questionnaire     Feeling of Stress : Very much   Social Connections: Moderately Isolated (06/18/2023)    Social Connection and Isolation Panel     Frequency of Communication with Friends and Family: More than three times a week     Frequency of Social Gatherings with Friends and Family: Once a week     Attends Religious Services: More than 4 times per year     Active Member of [employer] or Organizations: No     Attends Banker Meetings: Never     Marital Status: Never married   Intimate Partner Violence: Not At Risk (06/18/2023)    Humiliation, Afraid, Rape, and Kick questionnaire     Fear of Current or Ex-Partner: No     Emotionally Abused: No     Physically Abused: No     Sexually Abused: No   Housing Stability: High Risk (06/18/2023)    Housing Stability Vital Sign     Unable to Pay for Housing in the Last Year: Yes     Number of Times Moved in the Last Year: 0     Homeless in the Last Year: No      Family History  Problem Relation Age of Onset    Hypertension Mother     Stroke Mother     Hypertension Father     Arthritis Father     Breast cancer Sister         Diagnoses in her late 77's, died @62  - metastatic disease    Kidney disease Brother     Heart disease Maternal Grandmother     Prostate cancer Paternal Grandfather     Breast cancer Other         Maternal great-aunt dx and died in her 47's    Breast cancer Other         Maternal first cousin once removed - daughter of great-uant - dx and died in her 22's    Breast cancer Paternal Cousin 35    Breast cancer Paternal Cousin 56     OB History       Gravida   7    Para   6    Term   6    Preterm        AB   1    Living   6         SAB        IAB        Ectopic        Multiple        Live Births                    Allergies[3]  has a current medication list which includes the following prescription(s): betamethasone  dipropionate, elderberry, trelegy ellipta , losartan -hydrochlorothiazide , magnesium, vitamin d , vitamin e, anastrozole , atorvastatin , and  famotidine .        REVIEW OF SYSTEMS: 14 pt ROS reviewed with the patient and negative except for as documented in the HPI. All other systems reviewed.      PHYSICAL EXAM:  BP 133/86 (BP Site: Right arm, Patient Position: Sitting, Cuff Size: Large)   Pulse (!) 105   Temp 98.7 F (37.1 C) (Oral)   Resp 18   Ht 1.524 m (5')   Wt 88.6 kg (195 lb 4.8 oz)   SpO2 97%   BMI 38.14 kg/m         Performance Status:  0 - Fully active, able to carry on all pre-disease performance without restriction      General Appearance: Alert, cooperative, no distress, appears stated age  Head:  Normocephalic, without obvious abnormality, atraumatic  Neck: Supple, symmetrical, trachea midline, no adenopathy  Lungs: Clear to auscultation bilaterally, respirations unlabored  Chest Wall: No tenderness or deformity  Heart: Regular rate and rhythm, S1 and S2 normal, no murmur, rub or gallop  Breast Exam: Left partial mastectomy with no evidence of local recurrence and normal right breast  Abdomen: Soft, non-tender, no masses, no organomegaly  Extremities: Extremities normal, atraumatic, no cyanosis or edema  Skin:  Skin color, texture, turgor normal, no rashes or lesions  Lymph Nodes:  Cervical, supraclavicular, and axillary nodes normal       Labs Results:  Reviewed in epic today  Lab Results   Component Value Date    WBC 6.98 09/22/2023    HGB 13.6 09/22/2023    HCT 41.5 09/22/2023    MCV 94.7 09/22/2023    PLT 269 09/22/2023     Lab Results   Component Value Date    CREAT 0.9 09/22/2023    BUN 17 09/22/2023    NA 141 09/22/2023    K 4.3  09/22/2023    CL 106 09/22/2023    CO2 28 09/22/2023     Lab Results   Component Value Date    ALT 14 09/22/2023    AST 17 09/22/2023    ALKPHOS 71 09/22/2023    BILITOTAL 0.4 09/22/2023       Imaging Results:   Reviewed:   Bilateral Mammogram and ultrasound 04/03/2024     Malignant neoplasm of left breast in female, estrogen receptor positive  68 yo female with invasive ductal and lobular carcinoma  left breast, stage IA, pT1bN0, G2, ER>90%, PR 70%, HER2 IHC 0, Ki-67 28%     S/p left lumpectomy and sentinel node biopsy on 08/06/2023     Oncotype 21 (7 % risk of systemic recurrence after 5 years of endocrine therapy)     She completed radiation therapy for breast cancer on November 19, 2023, and began letrozole  in mid-March. Initially well-tolerated, she developed significant side effects after a few weeks, including hot flashes, bone aches, and morning palpitations. She discontinued letrozole  two to three weeks ago, resulting in resolution of palpitations and improvement in bone aches.     Stopped letrozole  -- switched to exemestane      To see her cardiologist for w/u palpitations then start exemestane      She took exemestane  x 3 weeks and then stopped because of significant side effects from her current medication regimen, including mood changes, hot flushes, aching and generalized malaise. Off exemestane  for almost 3 months.     She is concerned about side effects of tamoxifen so will not consider.     Trial anastrozole  QOD x 2 months      RTC 2 m (increase anastrozole  to daily as tolerated)       Osteopenia   Baseline DXA 09/27/2023 showed osteopenia with lowest T score -1.5 hip and fem neck     10-Year Fracture Risk(1):  -----------------------------------------------------------------  Major Osteoporotic Fracture            3.6%  Hip Fracture                           0.6%  -----------------------------------------------------------------    Continue D3 and calcium  rich food   Recommended WBE     Repeat DXA q 2 years     Jon Ober, MD      Level 4 f/u  Breast cancer management  Long term medication management  Risk for recurrence  endocrine therapy counseling including risks / benefits / side effects / bone health and monitoring of bone health and bone density.             [1]   Past Medical History:  Diagnosis Date    Abnormal vision     glasses    Anxiety     Not on meds    Arthritis     bilateral  knees/OA hips/back    Carpal tunnel syndrome on both sides     stiffness noted     Chronic obstructive pulmonary disease (CMS/HCC)     Uses inhalers as prescribed    Difficulty walking     limps    Eczema     has prescription cream    Fracture     finger. resolved, as a child    Gastroesophageal reflux disease     heartburn recently diet related/some problem swallowing    History of gonorrhea     teenager    Hx of Hepatitis  C     s/p treatment    Hyperlipidemia     Hypertensive disorder     stable meds    Lower back pain     sciatica radiates down LLE: Pain range= 0-10    Malignant neoplasm of left female breast, unspecified estrogen receptor status, unspecified site of breast (CMS/HCC)     Pre-op dx    Neck pain     related to lower back but significant at this time     Neuropathy     numbness upon awakening from low back radiating down LLE .SABRA. 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 fixed surgically   [2]   Past Surgical History:  Procedure Laterality Date    ARTHROPLASTY, KNEE, TOTAL Left 12/14/2015    Procedure: ARTHROPLASTY, KNEE, TOTAL;  Surgeon: Fermin Oneil SAUNDERS, MD;  Location: [facility];  Service: Orthopedics;  Laterality: Left;  LEFT TOTAL KNEE ARTHROPLASTY    BIOPSY, BREAST, SENTINEL NODE Left 08/06/2023    Procedure: BIOPSY, BREAST, SENTINEL NODE;  Surgeon: Roselind Garner, MD;  Location: [facility];  Service: General;  Laterality: Left;    BREAST, LUMPECTOMY Left 08/06/2023    Procedure: LEFT BREAST LUMPECTOMY; LEFT BREAST WIRE LOC; LEFT BREAST 2ND LESION WIRE LOC; LEFT BREAST SENTINEL LYMPH NODE AND  EXCISION AND MAPPING IN OR BY MD;  Surgeon: Roselind Garner, MD;  Location: [facility];  Service: General;  Laterality: Left;    COLONOSCOPY, DIAGNOSTIC (SCREENING)  2016    X1 polyp    ENDOSCOPIC CARPAL TUNNEL RELEASE Right 03/02/2017     Procedure: ENDOSCOPIC CARPAL TUNNEL RELEASE;  Surgeon: Glendia Gilmore Nancyann Mickey., MD;  Location: [facility];  Service: Orthopedics;  Laterality: Right;    GANGLION CYST EXCISION Right 2007    x2    INDUCED ABORTION      KNEE ARTHROSCOPY Right 09/24/2014    KNEE ARTHROSCOPY Left 03/2015    LYMPHANGIOGRAM/LYMPHANGIOGRAPHY Left 08/06/2023    Procedure: LYMPHANGIOGRAM/LYMPHANGIOGRAPHY;  Surgeon: Roselind Garner, MD;  Location: [facility];  Service: General;  Laterality: Left;    MAMMOPLASTY, REDUCTION, BREAST HYPERTROPHY (MEDICAL) Left 08/06/2023    Procedure: MAMMOPLASTY, REDUCTION, BREAST HYPERTROPHY (MEDICAL);  Surgeon: Lindley Oneil CROME, MD;  Location: [facility];  Service: Plastics;  Laterality: Left;    PLACEMENT, PERCUTANEOUS, DEVICE, BREAST LOCALIZATION WITH ULTRASOUND GUIDANCE Left 08/06/2023    Procedure: PLACEMENT, PERCUTANEOUS, DEVICE, BREAST LOCALIZATION WITH ULTRASOUND GUIDANCE;  Surgeon: Roselind Garner, MD;  Location: [facility];  Service: General;  Laterality: Left;    REPAIR, UPPER EXTREMITY TENDON Right 03/02/2017    Procedure: REPAIR, UPPER EXTREMITY TENDON, EXCISION, GANGLION CYST;  Surgeon: Glendia Gilmore Nancyann Mickey., MD;  Location: [facility];  Service: Orthopedics;  Laterality: Right;  right wrist flexor carpi radialis tenosynovectomy w/ possible carpal tunnel release and soft tissue mass excision  q1-unk, asst=n, equip=needle tip bovie, beaver blade, lead hand, micro aire, md req 90 min/ok per JD    REVISION, SCAR LEVEL 1 (MEDICAL) Left 08/06/2023    Procedure: REVISION, SCAR LEVEL1 (MEDICAL);  Surgeon: Lindley Oneil CROME, MD;  Location: [facility];  Service: Plastics;  Laterality: Left;  left axilla closure    TONSILLECTOMY  TRANSFER/REARRANGEMENT, TISSUE, ADJACENT, TRUNK Left 08/06/2023    Procedure: TRANSFER/REARRANGEMENT, TISSUE, ADJACENT, TRUNK/BREAST;  Surgeon: Lindley Oneil CROME, MD;  Location: [facility];  Service: Plastics;   Laterality: Left;    VAGINAL DELIVERY      X2 spinals ( 6 natural deliveries)   [3]   Allergies  Allergen Reactions    Latex Rash

## 2024-05-29 ENCOUNTER — Encounter: Payer: Self-pay | Admitting: Internal Medicine

## 2024-05-29 ENCOUNTER — Telehealth: Payer: Self-pay | Admitting: Internal Medicine

## 2024-05-29 ENCOUNTER — Ambulatory Visit: Attending: Internal Medicine | Admitting: Internal Medicine

## 2024-05-29 VITALS — BP 133/86 | HR 105 | Temp 98.7°F | Resp 18 | Ht 60.0 in | Wt 195.3 lb

## 2024-05-29 DIAGNOSIS — R0781 Pleurodynia: Secondary | ICD-10-CM

## 2024-05-29 DIAGNOSIS — C50412 Malignant neoplasm of upper-outer quadrant of left female breast: Secondary | ICD-10-CM | POA: Insufficient documentation

## 2024-05-29 DIAGNOSIS — M8589 Other specified disorders of bone density and structure, multiple sites: Secondary | ICD-10-CM | POA: Insufficient documentation

## 2024-05-29 DIAGNOSIS — M170 Bilateral primary osteoarthritis of knee: Secondary | ICD-10-CM | POA: Insufficient documentation

## 2024-05-29 DIAGNOSIS — C50912 Malignant neoplasm of unspecified site of left female breast: Secondary | ICD-10-CM

## 2024-05-29 DIAGNOSIS — R1011 Right upper quadrant pain: Secondary | ICD-10-CM

## 2024-05-29 DIAGNOSIS — Z170 Estrogen receptor positive status [ER+]: Secondary | ICD-10-CM | POA: Insufficient documentation

## 2024-05-29 MED ORDER — ANASTROZOLE 1 MG PO TABS: 1.0000 mg | ORAL_TABLET | Freq: Every day | ORAL | 5 refills | Status: DC

## 2024-05-29 NOTE — Telephone Encounter (Signed)
 Rtc 2 m with Dorothyann Deters  NP - scheduled for 11/13 @ 10.30am with Dorothyann

## 2024-05-29 NOTE — Telephone Encounter (Signed)
 Rtc 2 m with Kathryn Deters  NP

## 2024-05-29 NOTE — Telephone Encounter (Signed)
 CT CAP and B/S ASAP to r/o mets (pain R rib cage/RUQ)

## 2024-06-02 ENCOUNTER — Emergency Department
Admission: EM | Admit: 2024-06-02 | Discharge: 2024-06-02 | Disposition: A | Discharge status: Home / Self Care | Attending: Emergency Medicine | Admitting: Emergency Medicine

## 2024-06-02 ENCOUNTER — Emergency Department

## 2024-06-02 DIAGNOSIS — R051 Acute cough: Secondary | ICD-10-CM | POA: Insufficient documentation

## 2024-06-02 DIAGNOSIS — F17210 Nicotine dependence, cigarettes, uncomplicated: Secondary | ICD-10-CM | POA: Insufficient documentation

## 2024-06-02 LAB — COVID-19 (SARS-COV-2) & INFLUENZA  A/B, NAA (ROCHE LIAT)
Influenza A RNA: NOT DETECTED
Influenza B RNA: NOT DETECTED
SARS-CoV-2 (COVID-19) RNA: NOT DETECTED

## 2024-06-02 MED ORDER — BENZONATATE 100 MG PO CAPS
100.0000 mg | ORAL_CAPSULE | Freq: Once | ORAL | Status: AC
Start: 2024-06-02 — End: 2024-06-02
  Administered 2024-06-02: 100 mg via ORAL
  Filled 2024-06-02: qty 1

## 2024-06-02 MED ORDER — BENZONATATE 100 MG PO CAPS
100.0000 mg | ORAL_CAPSULE | Freq: Three times a day (TID) | ORAL | 0 refills | Status: DC | PRN
Start: 2024-06-02 — End: 2024-06-16

## 2024-06-02 NOTE — ED Notes (Signed)
 250 ML apple juice served to the pt.

## 2024-06-02 NOTE — Discharge Instructions (Signed)
 Dear Ms. Wholey:    Thank you for choosing one of [facility] emergency departments.  I hope your visit today was EXCELLENT.      IF YOU DO NOT CONTINUE TO IMPROVE OR YOUR CONDITION WORSENS, PLEASE CONTACT YOUR DOCTOR OR RETURN IMMEDIATELY TO THE EMERGENCY DEPARTMENT.    Sincerely,  Adna Nofziger, Delanna LABOR, MD  Attending Emergency Physician  [facility] Emergency Department    OBTAINING A PRIMARY CARE APPOINTMENT    Primary care physicians (PCPs, also known as primary care doctors) are either internists or family medicine doctors. Both types of PCPs focus on health promotion, disease prevention, patient education and counseling, and treatment of acute and chronic medical conditions.    Call for an appointment with a primary care doctor.  Ask to see who is taking new patients.     [facility]  telephone:  [phone]  [email or web address]    For a pediatrician, call the [facility] referral line below.  You can also call to make an appointment at [facility] (except [employer] and [facility]):    [address]  [place], [place] [address]  [phone]    KENDALL ALY  Call [phone] (available 24 hours a day, 7 days a week) if you need any further referrals and we can help you find a primary care doctor or specialist.  Also, available online at:  [email or web address]    For more information regarding our services at [facility], please call the number above or visit the website [email or web address]    YOUR CONTACT INFORMATION  Before leaving please check with registration to make sure we have an up-to-date contact number.  You can call registration at [phone], Option 7 [facility] location) or [phone], Option 1 [place] location) to update your information.  For questions about your hospital bill, please call [phone].  For questions about your Emergency Dept Physician bill please call  [phone].      FREE HEALTH SERVICES  If you need help with health or social services, please call 2-1-1 for a free referral to resources in your area.  2-1-1 is a free service connecting people with information on health insurance, free clinics, pregnancy, mental health, dental care, food assistance, housing, and substance abuse counseling.  Also, available online at:  http://www.[facility].org    ORTHOPEDIC INJURY   Please know that significant injuries can exist even when an initial x-ray is read as normal or negative.  This can occur because some fractures (broken bones) are not initially visible on x-rays.  For this reason, close outpatient follow-up with your primary care doctor or bone specialist (orthopedist) is required.    MEDICATIONS AND FOLLOWUP  Please be aware that some prescription medications can cause drowsiness.  Use caution when driving or operating machinery.    The examination and treatment you have received in our Emergency Department is provided on an emergency basis, and is not intended to be a substitute for your primary care physician.  It is important that your doctor checks you again and that you report any new or remaining problems at that time.        ASSISTANCE WITH INSURANCE    Affordable Care Act  [facility])  Call to start or finish an application, compare plans, enroll or ask a question.  [phone]  TTY: [phone]  Web:  Healthcare.gov    Help Enrolling in [facility]  Cover [place]   780-438-3467 (TOLL-FREE)  484-467-2966 (TTY)  Web:  Http://www.coverva.org    Local Help Enrolling in the [employer]  [place]  Family Service  [phone] (MAIN)  Email:  health-help@nvfs .org  Web:  BlackjackMyths.is  Address:  [address] [place], [place] [address]    SEDATING MEDICATIONS  Sedating medications include strong pain medications (e.g. narcotics), muscle relaxers, benzodiazepines (used for anxiety and as muscle relaxers), Benadryl/diphenhydramine and other  antihistamines for allergic reactions/itching, and other medications.  If you are unsure if you have received a sedating medication, please ask your physician or nurse.  If you received a sedating medication: DO NOT drive a car. DO NOT operate machinery. DO NOT perform jobs where you need to be alert.  DO NOT drink alcoholic beverages while taking this medicine.     If you get dizzy, sit or lie down at the first signs. Be careful going up and down stairs.  Be extra careful to prevent falls.     Never give this medicine to others.     Keep this medicine out of reach of children.     Do not take or save old medicines. Throw them away when outdated.     Keep all medicines in a cool, dry place. DO NOT keep them in your bathroom medicine cabinet or in a cabinet above the stove.    MEDICATION REFILLS  Please be aware that we cannot refill any prescriptions through the ER. If you need further treatment from what is provided at your ER visit, please follow up with your primary care doctor or your pain management specialist.

## 2024-06-05 ENCOUNTER — Other Ambulatory Visit (INDEPENDENT_AMBULATORY_CARE_PROVIDER_SITE_OTHER): Payer: Self-pay | Admitting: Family Medicine

## 2024-06-05 DIAGNOSIS — I10 Essential (primary) hypertension: Secondary | ICD-10-CM

## 2024-06-05 NOTE — ED Provider Notes (Signed)
 [facility] EMERGENCY DEPARTMENT  ATTENDING PHYSICIAN HISTORY AND PHYSICAL EXAM     Patient Name: [name],[name]  Department:[facility] ACCESS [id number]  Encounter Date:  06/02/2024  Attending Physician: No att. providers found   Age: 68 y.o. female  Patient Room: 9/M 9  PCP: Crisoforo Fuller, MD           Diagnosis/Disposition:     Final diagnoses:   Acute cough       ED Disposition       ED Disposition   Discharge    Condition   --    Date/Time   Mon Jun 02, 2024  2:27 PM    Comment   Cherelle Midkiff discharge to home/self care.    Condition at disposition: Stable                 Follow-Up Providers (if applicable)    [place]  [address]  [place] [address]  [phone]    Call today  To schedule an appointment       Discharge Medication List as of 06/02/2024  2:28 PM        START taking these medications    Details   benzonatate  (TESSALON ) 100 MG capsule Take 1 capsule (100 mg) by mouth 3 (three) times daily as needed for Cough, Starting Mon 06/02/2024, E-Rx                 Medical Decision Making:     Initial Differential Diagnosis:  Initial differential diagnosis to include but not limited to: influenza, COVID 19, pneumonia, viral URI    Plan:  CXR  Flu and COVID    Final Impression:  Acute cough  The patient was deemed stable for discharge. They were given strict return precautions as it relates to their presumed diagnosis, verbalized understanding of these precautions and agreed to follow up as instructed. All questions were answered prior to discharge.       ED Medication Orders (From admission, onward)      Start Ordered     Status Ordering Provider    06/02/24 1401 06/02/24 1400  benzonatate  (TESSALON ) capsule 100 mg  Once        Route: Oral  Ordered Dose: 100 mg       Last MAR action: Given Terence Googe A            Medical Decision Making  Amount and/or Complexity of Data Reviewed  Labs: ordered.  Radiology: ordered.    Risk  Prescription drug management.        Assessment & Plan  Patient  presents with dry cough x2 days  She is well-appearing. She is tachycardic into low 100s-110s  Flu and COVID, and CXR shows no acute process.   I discharged her with Rx for benzonatate   She is to follow-up with PCP  Return precautions provided                          History of Presenting Illness:     Nursing Triage note:    Chief complaint: Cough    [name] is a 68 y.o. female who presents with cough for two days. The cough is dry. She now has chest wall pain and throat soreness. She denies SOB, fevers, chills or sweats.                 Review of Systems:  Physical Exam:  Review of Systems    Positive and negative ROS per above and in HPI. All other systems reviewed and negative.     Pulse (!) 112  BP 148/81  Resp 18  SpO2 97 %  Temp 98.5 F (36.9 C)       Constitutional: Vital signs reviewed. Non-toxic appearing.   Head: Normocephalic, atraumatic  Eyes: No conjunctival injection. No discharge. No scleral icterus.  ENT: Mucous membranes moist.  Respiratory/Chest: Clear to auscultation. No respiratory distress.   Cardiovascular: Regular rate and rhythm. No murmur appreciated.   Lower Extremity: No edema. No cyanosis. No gross deformities.  Neurological: No focal motor deficits by observation. Speech normal. Alert and oriented.  Skin: Warm and well-perfused, dry. No rash.              Interpretations, Clinical Decision Tools and Critical Care:       Radiology:  I reviewed and independently interpreted the following radiology studies: CXR 2 views shows no acute cardiopulmonary disease                                     Procedures:   Procedures      Attestations:     Scribe Attestation: There was no scribe involved in the care of this patient.     Documentation Notes:  My documentation is often completed after the patient is no longer under my clinical care. In some cases, the Epic EMR may pull updated results into the above documentation which may not reflect all results or information that were  available to me at the time of my medical decision making.   If there are questions or concerns about the content of this note or information contained within the body of this dictation they should be addressed directly with the author for clarification.                   Mossie Gilder A, MD  06/05/24 1159

## 2024-06-12 ENCOUNTER — Inpatient Hospital Stay
Admission: RE | Admit: 2024-06-12 | Discharge: 2024-06-12 | Disposition: A | Discharge status: Home / Self Care | Source: Ambulatory Visit | Attending: Internal Medicine | Admitting: Internal Medicine

## 2024-06-12 ENCOUNTER — Ambulatory Visit
Admission: RE | Admit: 2024-06-12 | Discharge: 2024-06-12 | Disposition: A | Discharge status: Home / Self Care | Source: Ambulatory Visit | Attending: Internal Medicine | Admitting: Internal Medicine

## 2024-06-12 ENCOUNTER — Ambulatory Visit

## 2024-06-12 ENCOUNTER — Other Ambulatory Visit

## 2024-06-12 DIAGNOSIS — R0781 Pleurodynia: Secondary | ICD-10-CM | POA: Insufficient documentation

## 2024-06-12 DIAGNOSIS — Z170 Estrogen receptor positive status [ER+]: Secondary | ICD-10-CM | POA: Insufficient documentation

## 2024-06-12 DIAGNOSIS — R1011 Right upper quadrant pain: Secondary | ICD-10-CM | POA: Insufficient documentation

## 2024-06-12 DIAGNOSIS — C50912 Malignant neoplasm of unspecified site of left female breast: Secondary | ICD-10-CM | POA: Insufficient documentation

## 2024-06-12 DIAGNOSIS — R0789 Other chest pain: Secondary | ICD-10-CM | POA: Insufficient documentation

## 2024-06-12 LAB — WHOLE BLOOD CREATININE WITH GFR POCT
GFR POCT: >=60 mL/min/1.73 m2 (ref >=60)
Whole Blood Creatinine POCT: 0.6 mg/dL (ref 0.5–1.1)

## 2024-06-12 MED ORDER — IOHEXOL 350 MG/ML IV SOLN
100.0000 mL | Freq: Once | INTRAVENOUS | Status: AC | PRN
Start: 2024-06-12 — End: 2024-06-12
  Administered 2024-06-12: 100 mL via INTRAVENOUS

## 2024-06-12 MED ORDER — TECHNETIUM TC 99M OXIDRONATE INJECTION
26.2000 | Freq: Once | Status: AC | PRN
Start: 2024-06-12 — End: 2024-06-12
  Administered 2024-06-12: 26.2 via INTRAVENOUS
  Filled 2024-06-12: qty 30

## 2024-06-14 NOTE — Assessment & Plan Note (Addendum)
 Chronic  BP controlled  On med  Orders:    Thyroid  Stimulating Hormone (TSH) with Reflex to Free T4; Future    CBC without Differential; Future

## 2024-06-14 NOTE — Progress Notes (Unsigned)
 [facility]  Medicare Wellness Visit         {  Disappearing Text  Click a link below to be taken to that activity or part of the chart   Chart Review  Order Review  Review Flowsheets  Labs  Health Maintenance  Immunizations  Allergies  Medications  Problem List  History  Synopsis   :55325}      [name] is a 68 y.o. female who presents today for the following Medicare Wellness Visit: Annual Wellness Visit - Subsequent  {  Disappearing Text  Coding Guidelines for Wellness Visits  IPPE - Welcome to Medicare preventive visit (Vision Screening Required) [phone]   Eligibility - Patient is within first 12 months of Medicare Part B enrollment and has not previously had an IPPE.  AWV - Initial [id number]   Eligibility - Patient has been enrolled in Medicare Part B 12 or more months and visit is 12 months after the IPPE (or if patient did not receive an IPPE during 60-month eligibility window)  AWV - Subsequent [phone]  Eligibility - Patient completed an initial AWV (H9561) and at least 12 months have passed since their last AWV.   :69553121}  History of Present Illness    Health Risk Assessment   During the past month, how would you rate your general health?:     Which of the following tasks can you do without assistance - drive or take the bus alone; shop for groceries or clothes; prepare your own meals; do your own housework/laundry; handle your own finances/pay bills; eat, bathe or get around your home?:    Which of the following problems have you been bothered by in the past month - dizzy when standing up; problems using the phone; feeling tired or fatigued; moderate or severe body pain?:    Do you exercise for about 20 minutes 3 or more days per week?:   During the past month was someone available to help if you needed and wanted help?  For example, if you felt nervous, lonely, got sick and had to stay in bed, needed someone to talk to, needed help with daily chores or  needed help just taking care of yourself.:    Do you always wear a seat belt?:    Do you have any trouble taking medications the way you have been told to take them?:    Have you been given any information that can help you with keeping track of your medications?:    Do you have trouble paying for your medications?:          -had colonoscopy in 2020  -pap done in 2019      PMH  HTN  Cholesterol  prediabetes  Chronic hep C.s/p treatment  Copd  Left knee replacement  Osteopenia    Specialist  P[facility]  cardiologist  Hospitalizations   Hospitalization within past year: {No/Yes, Ik:[id number]}    Screenings         05/02/2023 05/29/2024   Ambulatory Screenings   Falls Risk: Terrilee more than 2 times in past year N    Falls Risk: Suffer any injuries? N    Depression: PHQ2 Total Score 1  2   Depression: PHQ9 Total Score 1 5       Data saved with a previous flowsheet row definition      {  Disappearing Text  Substance History  Click to Enter/Edit Substance History then refresh your note   :  55325}  Substance Use Disorder Screen:  Daveena  reports that she has been smoking cigarettes. She has a 7.5 pack-year smoking history. She has never used smokeless tobacco. She reports current drug use. She reports that she does not drink alcohol.        Functional Ability/Level of Safety   Falls Risk/Home Safety Assessment:  Have you been given any information that can help you with hazards in your house, such as scatter rugs, furniture, etc?:    Do you feel unsteady when standing or walking?:    Do you worry about falling?:    Have you fallen two or more times in the past year?:    Did you suffer any injuries from your falls in the past year?:      Home Safety:   {Falls:73324}  {Get Up and Go (Optional):73323}    Hearing Assessment:  {Hearing:36074}    {  Disappearing Text  Visual Acuity Exam  Click to Enter/Edit Visual Acuity Exam then refresh your note. If the patient was not offered or did not decline the visual acuity portion of this  encounter please edit the below statement and provide details   :55325}  Visual Acuity:     If no visual acuity exam above, the patient has declined the visual acuity exam portion of this encounter.  {Optional Additional Vision Details:73325}    Exercise:   {Exercise:36075}    Diet:  {Ipzu:29776::- consumes a well balanced diet}      Activities of Daily Living   ADL's  Bathing: {ADL ASSISTANCE:28632::Independent}  Dressing: {ADL ASSISTANCE:28632::Independent}  Mobility: {ADL ASSISTANCE:28632::Independent}  Transfer: {ADL ASSISTANCE:28632::Independent}  Eating: {ADL ASSISTANCE:28632::Independent}  Toileting: {ADL ASSISTANCE:28632::Independent}    IADL's  Phone: {ADL ASSISTANCE:28632::Independent}  Housekeeping: {ADL ASSISTANCE:28632::Independent}  Laundry: {ADL ASSISTANCE:28632::Independent}  Transportation: {ADL ASSISTANCE:28632::Independent}  Medications: {ADL ASSISTANCE:28632::Independent}  Finances: {ADL ASSISTANCE:28632::Independent}    ADL assistance:   {ADL Assistance:73326::No assistance needed}    Social Activities/Engagement   Frequency of Communication with Friends and Family:  {Never/Rarely/Sometimes/Often/VeryOften:28948}    Frequency of Social Gatherings with Friends and Family:   {Never/Rarely/Sometimes/Often/VeryOften:28948}    Advanced Care Planning   Discussion of Advance Directives:   {Advance Directive:35027}         Exam   There were no vitals taken for this visit.  Physical Exam      Evaluation of Cognitive Function   Mood/affect: {Affect:73327}  Appearance:  {Appearance:36989::alert, well appearing, and in no distress}  Family member/caregiver input: {Caregiver Concerns:73328}    {  Disappearing Text  Mini-Cog  Step 1- Three word registration (Banana, Sunrise, Chair or Leader, Season, Table)  Step 2- Clock drawing  Step 3- Three word recall   :69553121}  Mini-Cog Score:  {AWV Mini-Cog:36077}        Assessment/Plan     Assessment & Plan  Medicare annual wellness  visit, subsequent         Essential hypertension         Hypercholesteremia         Osteopenia, unspecified location         Chronic hepatitis C without hepatic coma (CMS/HCC)         Other emphysema (CMS/HCC)         Prediabetes           Assessment & Plan        Personalized Prevention Plan   The patient was provided with a personalized prevention plan via   {PPP Location:70211}                                                                                                                                        {  Disappearing Text  A personalized prevention plan is a medicare requirement and must be included or the claim will be denied. The plan template is located in Patient Instructions and does not need to be included in the body of the note.    :69553121}  History/Care Team   Patient Care Team:  Crisoforo Fuller, MD as PCP - General (Family Medicine)  Fermin Oneil SAUNDERS, MD as Consulting Physician (Orthopaedic Surgery)  Waymond Adriane BROCKS, MD as Consulting Physician (Gastroenterology)  Creston Sherleen HERO, MD as Consulting Physician (Cardiology)  Karlyne Fairy HERO, MD as Consulting Physician (Cardiology)  Tobie Derick GAILS, MD as Consulting Physician (Sleep Medicine)  Jearld Dann JULIANNA Irven, Carlye RAMAN, MD as Consulting Physician (Sleep Medicine)  Cleotis Aureliano SAILOR, FNP as Nurse Practitioner (Family Nurse Practitioner)  Tate, Sundi E, NP as Nurse Practitioner (Nurse Practitioner)  Ramdani, Yasmin, MA as Technician  Medical, surgical, family history reviewed and updated during this encounter  Medication list updated and reconciled during this encounter    Additional Documentation                                                                                                                                              {     Disappearing Text  Modifier 25 is defined as a significant, seperately identifiable, medically necessary Evaluation and Management (E/M) service by the same provider on the same day of the other  service or procedure. That portion of the visit must be medically necessary and reasonable to treat the beneficiary illness or injury. E&M documentation must support that there has been a significant amount of additional work above and beyond what the provider would normally provide, and the visit can stand alone as a medically necessary billable service.     If the provider renders an E/M service (for example [phone]) in addition to a preventative service (G0402, [phone], or [phone]),   Provider should inform member of their potential responsibility to pay for the deductible/copay for the E/M portion of the service  Submit the CPT code with modifier -25 along with the G code as part of the claims encounter submission (for example [phone] and link [id number] to modifier 25)   :69553121}  Verbal consent obtained to record this visit when ambient technology is utilized.

## 2024-06-16 ENCOUNTER — Ambulatory Visit (INDEPENDENT_AMBULATORY_CARE_PROVIDER_SITE_OTHER): Payer: Self-pay | Admitting: Family Medicine

## 2024-06-16 ENCOUNTER — Ambulatory Visit (INDEPENDENT_AMBULATORY_CARE_PROVIDER_SITE_OTHER): Admitting: Family Medicine

## 2024-06-16 ENCOUNTER — Encounter (INDEPENDENT_AMBULATORY_CARE_PROVIDER_SITE_OTHER): Payer: Self-pay | Admitting: Family Medicine

## 2024-06-16 ENCOUNTER — Other Ambulatory Visit (INDEPENDENT_AMBULATORY_CARE_PROVIDER_SITE_OTHER): Payer: Self-pay | Admitting: Family Medicine

## 2024-06-16 ENCOUNTER — Ambulatory Visit: Payer: Self-pay | Admitting: Internal Medicine

## 2024-06-16 VITALS — BP 133/85 | HR 99

## 2024-06-16 DIAGNOSIS — R7303 Prediabetes: Secondary | ICD-10-CM

## 2024-06-16 DIAGNOSIS — E559 Vitamin D deficiency, unspecified: Secondary | ICD-10-CM

## 2024-06-16 DIAGNOSIS — I10 Essential (primary) hypertension: Secondary | ICD-10-CM

## 2024-06-16 DIAGNOSIS — R0789 Other chest pain: Secondary | ICD-10-CM

## 2024-06-16 DIAGNOSIS — J438 Other emphysema: Secondary | ICD-10-CM

## 2024-06-16 DIAGNOSIS — Z0000 Encounter for general adult medical examination without abnormal findings: Secondary | ICD-10-CM

## 2024-06-16 DIAGNOSIS — Z170 Estrogen receptor positive status [ER+]: Secondary | ICD-10-CM

## 2024-06-16 DIAGNOSIS — E7800 Pure hypercholesterolemia, unspecified: Secondary | ICD-10-CM

## 2024-06-16 DIAGNOSIS — Z23 Encounter for immunization: Secondary | ICD-10-CM

## 2024-06-16 DIAGNOSIS — M8580 Other specified disorders of bone density and structure, unspecified site: Secondary | ICD-10-CM

## 2024-06-16 DIAGNOSIS — B182 Chronic viral hepatitis C: Secondary | ICD-10-CM

## 2024-06-16 LAB — COMPREHENSIVE METABOLIC PANEL
ALT: 19 U/L (ref <=55)
AST (SGOT): 22 U/L (ref <=41)
Albumin/Globulin Ratio: 1.4 (ref 0.9–2.2)
Albumin: 4.1 g/dL (ref 3.5–4.9)
Alkaline Phosphatase: 72 U/L (ref 37–117)
Anion Gap: 11 (ref 5.0–15.0)
BUN: 18 mg/dL (ref 7–21)
Bilirubin, Total: 0.3 mg/dL (ref 0.2–1.2)
CO2: 26 meq/L (ref 17–29)
Calcium: 9.7 mg/dL (ref 8.5–10.5)
Chloride: 105 meq/L (ref 99–111)
Creatinine: 0.8 mg/dL (ref 0.4–1.0)
GFR: >60 mL/min/1.73 m2 (ref >=60.0)
Globulin: 3 g/dL (ref 2.0–3.6)
Glucose: 94 mg/dL (ref 70–100)
Hemolysis Index: 4 {index}
Potassium: 4.6 meq/L (ref 3.5–5.3)
Protein, Total: 7.1 g/dL (ref 6.0–8.3)
Sodium: 142 meq/L (ref 135–145)

## 2024-06-16 LAB — LIPID PANEL
Cholesterol / HDL Ratio: 5.1 {index}
Cholesterol: 250 mg/dL — ABNORMAL HIGH (ref <=199)
HDL: 49 mg/dL (ref >=40)
LDL Calculated: 185 mg/dL — ABNORMAL HIGH (ref 0–99)
Triglycerides: 81 mg/dL (ref 34–149)
VLDL Calculated: 16 mg/dL (ref 10–40)

## 2024-06-16 LAB — CBC
Absolute nRBC: 0 x10 3/uL (ref <=0.00)
Hematocrit: 43.7 % (ref 34.7–43.7)
Hemoglobin: 13.6 g/dL (ref 11.4–14.8)
MCH: 29.8 pg (ref 25.1–33.5)
MCHC: 31.1 g/dL — ABNORMAL LOW (ref 31.5–35.8)
MCV: 95.6 fL (ref 78.0–96.0)
MPV: 11.4 fL (ref 8.9–12.5)
Platelet Count: 309 x10 3/uL (ref 142–346)
RBC: 4.57 x10 6/uL (ref 3.90–5.10)
RDW: 14 % (ref 11–15)
WBC: 7.03 x10 3/uL (ref 3.10–9.50)
nRBC %: 0 /100{WBCs} (ref <=0.0)

## 2024-06-16 LAB — THYROID STIMULATING HORMONE (TSH) WITH REFLEX TO FREE T4: TSH: 1.57 u[IU]/mL (ref 0.35–4.94)

## 2024-06-16 LAB — HEMOGLOBIN A1C
Average Estimated Glucose: 119.8 mg/dL
Hemoglobin A1C: 5.8 % — ABNORMAL HIGH (ref 4.6–5.6)

## 2024-06-16 LAB — VITAMIN D, 25 OH, TOTAL: Vitamin D 25-OH, Total: 28 ng/mL — ABNORMAL LOW (ref 30–100)

## 2024-06-16 MED ORDER — ROSUVASTATIN CALCIUM 20 MG PO TABS
20.0000 mg | ORAL_TABLET | Freq: Every day | ORAL | 1 refills | Status: AC
Start: 2024-06-16 — End: ?

## 2024-06-16 MED ORDER — VITAMIN D3 1.25 MG (50000 UT) PO CAPS: 1.0000 | ORAL_CAPSULE | ORAL | 0 refills | Status: DC

## 2024-06-18 NOTE — Telephone Encounter (Signed)
 RN called patient to relay Dr. Jola message, call went to VM. LDVM with call back information.    Orders placed per MD request.     Patient Access, please assist with scheduling the following:  CT Chest in 3 months   Bone Scan in 3 months       Thank you,  Atha, BSN, RN Engineer, building services)

## 2024-06-24 ENCOUNTER — Telehealth: Payer: Self-pay

## 2024-06-24 NOTE — Telephone Encounter (Signed)
 CT Chest in 3 months  and Bone Scan in 3 months - scheduled for 001/08/26 @ 9am inj, BS 12pm and CT 9.40am same day with [employer]

## 2024-06-25 NOTE — Telephone Encounter (Signed)
 Patient calling into [facility] Triage line stating she is returning a call.     -Triage RN noted notation from Dr. Margaretha made on 06/16/2024 regarding recent NM bone scan results, as well as Flavia RN had LDVM.     Triage RN reiterated Dr. Jola note, pt confirming that is the area she mentioned she was having pain. Pt states she has already been scheduled for repeat CT Chest and Bone Scan in 3 mo. No further questions at this time.

## 2024-07-30 NOTE — Progress Notes (Deleted)
 [facility]  [address] [place]  [phone]      [place] Office  [phone]          Visit Date: 07/30/2024    No chief complaint on file.        HISTORY OF PRESENT ILLNESS:  Verbal consent obtained to [employer] software to assist in note writing      History of Present Illness    [name] is a 68 year old female with invasive breast cancer who presents to discuss side effects from endocrine therapy.   She took exemestane  x 3 weeks and then stopped because of significant side effects from her current medication regimen, including mood changes, hot flushes, aching and generalized malaise. Off exemestane  for almost 3 months.     She has significant anxiety, exacerbated by a recent breast examination scare of abnormality on mammogram requiring a biopsy (returned benign).          Oncology History Overview Note   Physician Requesting Consult: Roselind Garner, MD    Breast Surgeon: Garner Roselind, MD  Radiation Oncologist: Lauraine Jock, MD  Plastic Surgeon:   PCP: Rosalynd Star, MD      [name] is a 68 y.o. 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     Bilateral screening mammogram 05/04/2023 showed:   Left breast focal asymmetry and question distortion for which further  evaluation is recommended.    Diagnostic mammogram/ultrasound 05/13/2024 showed:   ULTRASOUND:  Targeted ultrasound of the left lateral breast performed to evaluate the  questioned mammographic finding.       At 2:30 7 cm from the nipple there is an oval circumscribed mildly  hypoechoic mass with internal echogenicities. This is felt to correspond to  the mammographic asymmetry with calcifications. This measures 0.6 x 0.3 x  0.5 cm.      At 3:00 in the periareolar breast there is an incidentally detected  benign-appearing cyst measuring 0.2 x 0.3 x 0.3 cm.     At 2:00 4 cm from the nipple there is an irregular  hypoechoic mass  measuring 0.6 x 0.5 x 0.5 cm felt to be a possible correlate to the  questioned mammographic distortion.     Ultrasound of the left axilla demonstrated benign-appearing lymph nodes  with preserved morphology.     IMPRESSION:         1. Left breast 2:30 mass for which ultrasound-guided core biopsy is  recommended.  2. Left breast 2:00 mass for which ultrasound-guided core biopsy is  recommended. Recommend correlation of clip placement to the mammographic  distortion.        Ultrasound guided biopsy left breast 06/06/2023  showed:  Left Breast 2:30 7 cm FN. Usual ductal hyperplasia, columnar cell change  and cysts, in a background of dense stromal fibrosis and pseudoangiomatous  stromal hyperplasia (PASH).     Left Breast 2:00 4 cm FN.   Invasive mammary carcinoma with ductal and  lobular features (tubule formation 3/3, nuclear pleomorphism 2/3, mitotic  count 1/3), measuring 0.45 cm in maximal length in this material.    ER>90%, PR 70%, HER2 IHC 0, Ki-67 28%       MRI bilateral breasts 06/25/2023 showed:  FINDINGS:  Amount of glandular tissue: There is scattered fibroglandular tissue.  Background parenchymal enhancement:  Mild.     Right breast: No suspicious enhancement.  Left breast: At the 2:00 position, anterior depth, there is an irregular  enhancing mass measuring 0.5 x 0.7 x 0.9 cm demonstrating persistent type I  enhancement corresponding to biopsy-proven invasive mammary carcinoma.  Biopsy marker is seen adjacent.     No additional suspicious enhancement in the left breast.  Susceptibility artifact from a second biopsy marker is seen in the upper  outer quadrant more posteriorly and inferiorly corresponding to benign  ultrasound-guided biopsy at 2:30 position without suspicious enhancement.     Lymph nodes: No enlarged axillary or internal mammary chain lymph nodes.     IMPRESSION:     1. Biopsy-proven left breast 2:00 mass measuring 0.9 cm. No additional  suspicious enhancement.  2. No MRI  evidence for malignancy in the contralateral right breast.  3. No axillary lymphadenopathy.      S/p left lumpectomy and sentinel node biopsy on 08/06/2023    Final Pathology showed:  -invasive ductal and lobular carcinoma   -pT1b (0.8 cm)  -G2  -pN0 (0/2)  -Margins negative  -LVI absent  -Associated LCIS    Oncotype 21 (7 % risk of systemic recurrence after 5 years of endocrine therapy)        Breast Cancer Risk Factors:  Age at Menarche:  4     Age at 77st FTP:  55   Still menstruating?:  No   Age at last menses: 51  Hysterectomy: No   Estrogen Therapy:          Oral Contraceptives: No           Hormone Replacement Therapy:  No         Reproductive Assistance Therapies:  No  Family History of Cancer:            Breast:   Yes Sister, maternal cousin, and maternal great aunt         Ovarian:No         Colon: No                Prostate:  Yes          Pancreatic:  No         Melanoma: No  Ashkenazi Jewish Ancestry: No       Genetic testing negative       Denies F/C/N/V/diarrhea. No CP/SOB. No HA/visual changes/focal weakness.       Malignant neoplasm of left breast in female, estrogen receptor positive (CMS/HCC)   09/20/2023 Initial Diagnosis    Malignant neoplasm of left breast in female, estrogen receptor positive     09/21/2023 Cancer Staged    Staging form: Breast, AJCC 8th Edition  - Clinical stage from 09/21/2023: Stage IA (cT1b, cN0(sn), cM0, G2, ER+, PR+, HER2-, Oncotype DX score: 21) - Signed by Arne Mungo, MD on 09/21/2023          Cancer Staging   Malignant neoplasm of left breast in female, estrogen receptor positive (CMS/HCC)  Staging form: Breast, AJCC 8th Edition  - Clinical stage from 09/21/2023: Stage IA (cT1b, cN0(sn), cM0, G2, ER+, PR+, HER2-, Oncotype DX score: 21) - Signed by Arne Mungo, MD on 09/21/2023  Stage prefix: Initial diagnosis  Method of lymph node assessment: Sentinel lymph node biopsy  Multigene prognostic tests performed: Oncotype DX  Recurrence score range: Greater than or equal to  11  Histologic grading system: 3 grade system  Percentage of positive estrogen receptors (%): 90  Percentage of positive progesterone receptors (%): 70  HER2-IHC interpretation: Negative  HER2-IHC value: Score 0  Ki-67 (%): 28  Oncotype DX recurrence risk level: Low risk      Medical History[1]  Past Surgical History[2]  Social History     Socioeconomic History    Marital status: Single   Tobacco Use    Smoking status: Some Days     Current packs/day: 0.25     Average packs/day: 0.3 packs/day for 30.0 years (7.5 ttl pk-yrs)     Types: Cigarettes    Smokeless tobacco: Never    Tobacco comments:     a pack lasts several days   Vaping Use    Vaping status: Never Used   Substance and Sexual Activity    Alcohol use: No    Drug use: Yes     Comment: Marijuana    Sexual activity: Not Currently   Social History Narrative    Caffeine: drinks decaff tea    Diet: tries to stay well balanced    Exercise: not really. 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     Pets: none grand dog    Hobbies:  Grand kids     Social Drivers of Psychologist, Prison [employer] Probation Services Strain: High Risk (06/18/2023)    Overall Financial Resource Strain (CARDIA)     Difficulty of Paying Living Expenses: Hard   Food Insecurity: No Food Insecurity (06/02/2024)    Hunger Vital Sign     Worried About Running Out of Food in the Last Year: Never true     Ran Out of Food in the Last Year: Never true   Transportation Needs: No Transportation Needs (06/02/2024)    PRAPARE - Therapist, Art (Medical): No     Lack of Transportation (Non-Medical): No   Physical Activity: Inactive (06/18/2023)    Exercise Vital Sign     Days of Exercise per Week: 2 days     Minutes of Exercise per Session: 0 min   Stress: Stress Concern Present (06/18/2023)    [employer] of Occupational Health - Occupational Stress Questionnaire     Feeling of Stress : Very much   Social Connections: Moderately Isolated (06/18/2023)    Social Connection and  Isolation Panel     Frequency of Communication with Friends and Family: More than three times a week     Frequency of Social Gatherings with Friends and Family: Once a week     Attends Religious Services: More than 4 times per year     Active Member of [employer] or Organizations: No     Attends Banker Meetings: Never     Marital Status: Never married   Intimate Partner Violence: Not At Risk (06/02/2024)    Humiliation, Afraid, Rape, and Kick questionnaire     Fear of Current or Ex-Partner: No     Emotionally Abused: No     Physically Abused: No     Sexually Abused: No   Housing Stability: Not At Risk (06/02/2024)    Housing Stability NCSS     Do you have housing?: Yes     Are you worried about losing your housing?: No      Family History   Problem Relation Age of Onset    Hypertension Mother     Stroke Mother     Hypertension Father     Arthritis Father     Breast cancer Sister         Diagnoses in  her late 49's, died @62  - metastatic disease    Kidney disease Brother     Heart disease Maternal Grandmother     Prostate cancer Paternal Grandfather     Breast cancer Other         Maternal great-aunt dx and died in her 77's    Breast cancer Other         Maternal first cousin once removed - daughter of great-uant - dx and died in her 44's    Breast cancer Paternal Cousin 69    Breast cancer Paternal Cousin 22     OB History       Gravida   7    Para   6    Term   6    Preterm        AB   1    Living   6         SAB        IAB        Ectopic        Multiple        Live Births                    Allergies[3]  has a current medication list which includes the following prescription(s): anastrozole , betamethasone  dipropionate, vitamin d3, elderberry, famotidine , trelegy ellipta , losartan -hydrochlorothiazide , magnesium, rosuvastatin, and vitamin e.        REVIEW OF SYSTEMS: 14 pt ROS reviewed with the patient and negative except for as documented in the HPI. All other systems reviewed.      PHYSICAL EXAM:  There were  no vitals taken for this visit.        Performance Status:  0 - Fully active, able to carry on all pre-disease performance without restriction      General Appearance: Alert, cooperative, no distress, appears stated age  Head:  Normocephalic, without obvious abnormality, atraumatic  Neck: Supple, symmetrical, trachea midline, no adenopathy  Lungs: Clear to auscultation bilaterally, respirations unlabored  Chest Wall: No tenderness or deformity  Heart: Regular rate and rhythm, S1 and S2 normal, no murmur, rub or gallop  Breast Exam: Left partial mastectomy with no evidence of local recurrence and normal right breast  Abdomen: Soft, non-tender, no masses, no organomegaly  Extremities: Extremities normal, atraumatic, no cyanosis or edema  Skin:  Skin color, texture, turgor normal, no rashes or lesions  Lymph Nodes:  Cervical, supraclavicular, and axillary nodes normal       Labs Results:  Reviewed in epic today  Lab Results   Component Value Date    WBC 7.03 06/16/2024    HGB 13.6 06/16/2024    HCT 43.7 06/16/2024    MCV 95.6 06/16/2024    PLT 309 06/16/2024     Lab Results   Component Value Date    CREAT 0.8 06/16/2024    BUN 18 06/16/2024    NA 142 06/16/2024    K 4.6 06/16/2024    CL 105 06/16/2024    CO2 26 06/16/2024     Lab Results   Component Value Date    ALT 19 06/16/2024    AST 22 06/16/2024    ALKPHOS 72 06/16/2024    BILITOTAL 0.3 06/16/2024       Imaging Results:   Reviewed:   06/12/24 CT CAP  IMPRESSION:   1. No definite metastatic disease in the chest, abdomen, or pelvis.  2. No findings in the right rib cage or right upper  quadrant abdomen to explain the patient's pain.  3. Moderate left colon diverticulosis, without acute diverticulitis.  4. Remainder as above.    06/12/24 Bone Scan  IMPRESSION:      1. Focus of subtle increased uptake left manubrium nonspecific and may be reactive or related to adjacent sternal clavicular joint. Focus of mildly increased uptake left lateral ninth rib nonspecific and can  be due to occult trauma. Attention on follow-up.  2. Otherwise no evidence for osseous metastatic disease.  3. Degenerative uptake at locations indicated above.  4. Diffuse tracer activity in stomach as above.    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 Initially well-tolerated, she developed significant side effects after a few weeks, including hot flashes, bone aches, and morning palpitations. She discontinued letrozole  two to three weeks ago, resulting in resolution of palpitations and improvement in bone aches.     Stopped letrozole  -- switched to exemestane      To see her cardiologist for w/u palpitations then start exemestane      She took exemestane  x 3 weeks and then stopped because of significant side effects from her current medication regimen, including mood changes, hot flushes, aching and generalized malaise. Off exemestane  for almost 3 months.     She is concerned about side effects of tamoxifen so will not consider.     Trial anastrozole  QOD x 2 months   ***    06/12/24 CT CAP and BS without clear evidence of metastatic disease, to repeat scans in 3 months per Dr. Margaretha  Scans scheduled 09/25/24.     RTC 2 m (increase anastrozole  to daily as tolerated)       Osteopenia   Baseline DXA 09/27/2023 showed osteopenia with lowest T score -1.5 hip and fem neck     10-Year Fracture Risk(1):  -----------------------------------------------------------------  Major Osteoporotic Fracture            3.6%  Hip Fracture                            0.6%  -----------------------------------------------------------------    Continue D3 and calcium  rich food   Recommended WBE     Repeat DXA q 2 years     Dorothyann GORMAN Deters, NP      Level 4 f/u  Breast cancer management  Long term medication management  Risk for recurrence  endocrine therapy counseling including risks / benefits / side effects / bone health and monitoring of bone health and bone density.             [1]   Past Medical History:  Diagnosis Date    Abnormal vision     glasses    Anxiety     Not on meds    Arthritis     bilateral knees/OA hips/back    Carpal tunnel syndrome on both sides     stiffness noted     Chronic obstructive pulmonary disease (CMS/HCC)     Uses inhalers as prescribed    Difficulty  walking     limps    Eczema     has prescription cream    Fracture     finger. resolved, as a child    Gastroesophageal reflux disease     heartburn recently diet related/some problem swallowing    History of gonorrhea     teenager    Hx of Hepatitis C     s/p treatment    Hyperlipidemia     Hypertensive disorder     stable meds    Lower back pain     sciatica radiates down LLE: Pain range= 0-10    Malignant neoplasm of left female breast, unspecified estrogen receptor status, unspecified site of breast (CMS/HCC)     Pre-op dx    Neck pain     related to lower back but significant at this time     Neuropathy     numbness upon awakening from low back radiating down LLE .SABRA. 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 fixed surgically   [2]   Past Surgical History:  Procedure Laterality Date    ARTHROPLASTY, KNEE, TOTAL Left 12/14/2015    Procedure: ARTHROPLASTY, KNEE, TOTAL;  Surgeon: Fermin Oneil SAUNDERS, MD;  Location: [place] [facility];  Service: Orthopedics;  Laterality: Left;  LEFT TOTAL KNEE ARTHROPLASTY    BIOPSY, BREAST, SENTINEL NODE Left 08/06/2023    Procedure:  BIOPSY, BREAST, SENTINEL NODE;  Surgeon: Roselind Garner, MD;  Location: [facility];  Service: General;  Laterality: Left;    BREAST, LUMPECTOMY Left 08/06/2023    Procedure: LEFT BREAST LUMPECTOMY; LEFT BREAST WIRE LOC; LEFT BREAST 2ND LESION WIRE LOC; LEFT BREAST SENTINEL LYMPH NODE AND  EXCISION AND MAPPING IN OR BY MD;  Surgeon: Roselind Garner, MD;  Location: [facility];  Service: General;  Laterality: Left;    COLONOSCOPY, DIAGNOSTIC (SCREENING)  2016    X1 polyp    ENDOSCOPIC CARPAL TUNNEL RELEASE Right 03/02/2017    Procedure: ENDOSCOPIC CARPAL TUNNEL RELEASE;  Surgeon: Glendia Gilmore Nancyann Mickey., MD;  Location: [place] [facility];  Service: Orthopedics;  Laterality: Right;    GANGLION CYST EXCISION Right 2007    x2    INDUCED ABORTION      KNEE ARTHROSCOPY Right 09/24/2014    KNEE ARTHROSCOPY Left 03/2015    LYMPHANGIOGRAM/LYMPHANGIOGRAPHY Left 08/06/2023    Procedure: LYMPHANGIOGRAM/LYMPHANGIOGRAPHY;  Surgeon: Roselind Garner, MD;  Location: [facility];  Service: General;  Laterality: Left;    MAMMOPLASTY, REDUCTION, BREAST HYPERTROPHY (MEDICAL) Left 08/06/2023    Procedure: MAMMOPLASTY, REDUCTION, BREAST HYPERTROPHY (MEDICAL);  Surgeon: Lindley Oneil CROME, MD;  Location: [facility];  Service: Plastics;  Laterality: Left;    PLACEMENT, PERCUTANEOUS, DEVICE, BREAST LOCALIZATION WITH ULTRASOUND GUIDANCE Left 08/06/2023    Procedure: PLACEMENT, PERCUTANEOUS, DEVICE, BREAST LOCALIZATION WITH ULTRASOUND GUIDANCE;  Surgeon: Roselind Garner, MD;  Location: [facility];  Service: General;  Laterality: Left;    REPAIR, UPPER EXTREMITY TENDON Right 03/02/2017    Procedure: REPAIR, UPPER EXTREMITY TENDON, EXCISION, GANGLION CYST;  Surgeon: Glendia Gilmore Nancyann Mickey., MD;  Location: [facility];  Service: Orthopedics;  Laterality: Right;  right wrist flexor carpi radialis tenosynovectomy w/ possible carpal tunnel release and soft tissue mass excision  q1-unk,  asst=n, equip=needle tip bovie, beaver blade, lead hand, micro aire, md req 90 min/ok per JD    REVISION, SCAR LEVEL 1 (MEDICAL) Left 08/06/2023    Procedure: REVISION, SCAR LEVEL1 (MEDICAL);  Surgeon: Lindley Oneil CROME, MD;  Location: [facility];  Service: Plastics;  Laterality: Left;  left axilla closure    TONSILLECTOMY      TRANSFER/REARRANGEMENT, TISSUE, ADJACENT, TRUNK Left 08/06/2023    Procedure: TRANSFER/REARRANGEMENT, TISSUE, ADJACENT, TRUNK/BREAST;  Surgeon: Lindley Oneil CROME, MD;  Location: [facility];  Service: Plastics;  Laterality: Left;    VAGINAL DELIVERY      X2 spinals ( 6 natural deliveries)   [3]   Allergies  Allergen Reactions    Latex Rash

## 2024-07-31 ENCOUNTER — Ambulatory Visit: Admitting: Nurse Practitioner

## 2024-08-06 ENCOUNTER — Encounter: Payer: Self-pay | Admitting: Nurse Practitioner

## 2024-08-06 NOTE — Progress Notes (Unsigned)
 [facility]  [address] [place]  [phone]      [place] Office  [phone]          Visit Date: 08/07/2024    Chief Complaint   Patient presents with    Malignant neoplasm of upper-outer quadrant of left breast i         HISTORY OF PRESENT ILLNESS:  Verbal consent obtained to [employer]bridge software to assist in note writing      History of Present Illness    [name] is a 68 year old female with breast cancer who presents for follow-up on anastrozole  treatment and upcoming scans.    She takes anastrozole  every other day. Initially, she had significant knee pain, which has improved to mild and manageable levels. She experiences hot flashes, especially at night, leading to sweating and kicking off covers. She recalls one or two episodes of intense sweating affecting her neck and chest, though these are infrequent.    Relates increased stress recently as she is in the process of moving.         Oncology History Overview Note   Physician Requesting Consult: Roselind Garner, MD    Breast Surgeon: Garner Roselind, MD  Radiation Oncologist: Lauraine Jock, MD  Plastic Surgeon:   PCP: Rosalynd Star, MD      [name] is a 68 y.o. 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     Bilateral screening mammogram 05/04/2023 showed:   Left breast focal asymmetry and question distortion for which further  evaluation is recommended.    Diagnostic mammogram/ultrasound 05/13/2024 showed:   ULTRASOUND:  Targeted ultrasound of the left lateral breast performed to evaluate the  questioned mammographic finding.       At 2:30 7 cm from the nipple there is an oval circumscribed mildly  hypoechoic mass with internal echogenicities. This is felt to correspond to  the mammographic asymmetry with calcifications. This measures 0.6 x 0.3 x  0.5 cm.      At 3:00 in the periareolar breast there is an incidentally  detected  benign-appearing cyst measuring 0.2 x 0.3 x 0.3 cm.     At 2:00 4 cm from the nipple there is an irregular hypoechoic mass  measuring 0.6 x 0.5 x 0.5 cm felt to be a possible correlate to the  questioned mammographic distortion.     Ultrasound of the left axilla demonstrated benign-appearing lymph nodes  with preserved morphology.     IMPRESSION:         1. Left breast 2:30 mass for which ultrasound-guided core biopsy is  recommended.  2. Left breast 2:00 mass for which ultrasound-guided core biopsy is  recommended. Recommend correlation of clip placement to the mammographic  distortion.        Ultrasound guided biopsy left breast 06/06/2023  showed:  Left Breast 2:30 7 cm FN. Usual ductal hyperplasia, columnar cell change  and cysts, in a background of dense stromal fibrosis and pseudoangiomatous  stromal hyperplasia (PASH).     Left Breast 2:00 4 cm FN.   Invasive mammary carcinoma with ductal and  lobular features (tubule formation 3/3, nuclear pleomorphism 2/3, mitotic  count 1/3), measuring 0.45 cm in maximal length in this material.    ER>90%, PR 70%, HER2 IHC 0, Ki-67 28%       MRI bilateral breasts 06/25/2023 showed:  FINDINGS:  Amount of glandular tissue:  There is scattered fibroglandular tissue.  Background parenchymal enhancement:  Mild.     Right breast: No suspicious enhancement.     Left breast: At the 2:00 position, anterior depth, there is an irregular  enhancing mass measuring 0.5 x 0.7 x 0.9 cm demonstrating persistent type I  enhancement corresponding to biopsy-proven invasive mammary carcinoma.  Biopsy marker is seen adjacent.     No additional suspicious enhancement in the left breast.  Susceptibility artifact from a second biopsy marker is seen in the upper  outer quadrant more posteriorly and inferiorly corresponding to benign  ultrasound-guided biopsy at 2:30 position without suspicious enhancement.     Lymph nodes: No enlarged axillary or internal mammary chain lymph nodes.      IMPRESSION:     1. Biopsy-proven left breast 2:00 mass measuring 0.9 cm. No additional  suspicious enhancement.  2. No MRI evidence for malignancy in the contralateral right breast.  3. No axillary lymphadenopathy.      S/p left lumpectomy and sentinel node biopsy on 08/06/2023    Final Pathology showed:  -invasive ductal and lobular carcinoma   -pT1b (0.8 cm)  -G2  -pN0 (0/2)  -Margins negative  -LVI absent  -Associated LCIS    Oncotype 21 (7 % risk of systemic recurrence after 5 years of endocrine therapy)        Breast Cancer Risk Factors:  Age at Menarche:  71     Age at 7st FTP:  38   Still menstruating?:  No   Age at last menses: 51  Hysterectomy: No   Estrogen Therapy:          Oral Contraceptives: No           Hormone Replacement Therapy:  No         Reproductive Assistance Therapies:  No  Family History of Cancer:            Breast:   Yes Sister, maternal cousin, and maternal great aunt         Ovarian:No         Colon: No                Prostate:  Yes          Pancreatic:  No         Melanoma: No  Ashkenazi Jewish Ancestry: No       Genetic testing negative       Denies F/C/N/V/diarrhea. No CP/SOB. No HA/visual changes/focal weakness.       Malignant neoplasm of left breast in female, estrogen receptor positive (CMS/HCC)   09/20/2023 Initial Diagnosis    Malignant neoplasm of left breast in female, estrogen receptor positive     09/21/2023 Cancer Staged    Staging form: Breast, AJCC 8th Edition  - Clinical stage from 09/21/2023: Stage IA (cT1b, cN0(sn), cM0, G2, ER+, PR+, HER2-, Oncotype DX score: 21) - Signed by Arne Mungo, MD on 09/21/2023          Cancer Staging   Malignant neoplasm of left breast in female, estrogen receptor positive (CMS/HCC)  Staging form: Breast, AJCC 8th Edition  - Clinical stage from 09/21/2023: Stage IA (cT1b, cN0(sn), cM0, G2, ER+, PR+, HER2-, Oncotype DX score: 21) - Signed by Arne Mungo, MD on 09/21/2023  Stage prefix: Initial diagnosis  Method of lymph node assessment: Sentinel  lymph node biopsy  Multigene prognostic tests performed: Oncotype DX  Recurrence score range: Greater than or equal to 11  Histologic grading system:  3 grade system  Percentage of positive estrogen receptors (%): 90  Percentage of positive progesterone receptors (%): 70  HER2-IHC interpretation: Negative  HER2-IHC value: Score 0  Ki-67 (%): 28  Oncotype DX recurrence risk level: Low risk      Medical History[1]  Past Surgical History[2]  Social History     Socioeconomic History    Marital status: Single   Tobacco Use    Smoking status: Some Days     Current packs/day: 0.25     Average packs/day: 0.3 packs/day for 30.0 years (7.5 ttl pk-yrs)     Types: Cigarettes    Smokeless tobacco: Never    Tobacco comments:     a pack lasts several days   Vaping Use    Vaping status: Never Used   Substance and Sexual Activity    Alcohol use: Never    Drug use: Yes     Comment: Marijuana    Sexual activity: Not Currently   Social History Narrative    Caffeine: drinks decaff tea    Diet: tries to stay well balanced    Exercise: not really. 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     Pets: none grand dog    Hobbies:  Grand kids     Social Drivers of Psychologist, Prison And Probation Services Strain: Low Risk (08/06/2024)    Overall Financial Resource Strain (CARDIA)     Difficulty of Paying Living Expenses: Not hard at all   Food Insecurity: No Food Insecurity (08/06/2024)    Hunger Vital Sign     Worried About Running Out of Food in the Last Year: Never true     Ran Out of Food in the Last Year: Never true   Transportation Needs: No Transportation Needs (08/06/2024)    PRAPARE - Therapist, Art (Medical): No     Lack of Transportation (Non-Medical): No   Physical Activity: Inactive (08/06/2024)    Exercise Vital Sign     Days of Exercise per Week: 1 day     Minutes of Exercise per Session: 0 min   Stress: Stress Concern Present (08/06/2024)    [employer] of Occupational Health - Occupational  Stress Questionnaire     Feeling of Stress : To some extent   Social Connections: Moderately Isolated (06/18/2023)    Social Connection and Isolation Panel     Frequency of Communication with Friends and Family: More than three times a week     Frequency of Social Gatherings with Friends and Family: Once a week     Attends Religious Services: More than 4 times per year     Active Member of [employer] or Organizations: No     Attends Banker Meetings: Never     Marital Status: Never married   Intimate Partner Violence: Not At Risk (08/06/2024)    Humiliation, Afraid, Rape, and Kick questionnaire     Fear of Current or Ex-Partner: No     Emotionally Abused: No     Physically Abused: No     Sexually Abused: No   Housing Stability: At Risk (08/06/2024)    Housing Stability NCSS     Do you have housing?: No     Are you worried about losing your housing?: No      Family History   Problem Relation Age of Onset    Hypertension Mother     Stroke Mother  Hypertension Father     Arthritis Father     Breast cancer Sister         Diagnoses in her late 72's, died @62  - metastatic disease    Kidney disease Brother     Heart disease Maternal Grandmother     Prostate cancer Paternal Grandfather     Breast cancer Other         Maternal great-aunt dx and died in her 30's    Breast cancer Other         Maternal first cousin once removed - daughter of great-uant - dx and died in her 43's    Breast cancer Paternal Cousin 59    Breast cancer Paternal Cousin 37     OB History       Gravida   7    Para   6    Term   6    Preterm        AB   1    Living   6         SAB        IAB        Ectopic        Multiple        Live Births                    Allergies[3]  has a current medication list which includes the following prescription(s): anastrozole , betamethasone  dipropionate, vitamin d3, trelegy ellipta , losartan -hydrochlorothiazide , magnesium, rosuvastatin, vitamin e, elderberry, and famotidine .        REVIEW OF SYSTEMS: 14 pt ROS  reviewed with the patient and negative except for as documented in the HPI. All other systems reviewed.      PHYSICAL EXAM:  BP 119/83 (BP Site: Right arm, Patient Position: Sitting, Cuff Size: Large)   Pulse (!) 101   Temp 98.3 F (36.8 C) (Oral)   Resp 18   Ht 1.524 m (5')   Wt 89.8 kg (198 lb)   SpO2 97%   BMI 38.67 kg/m         Performance Status:  0 - Fully active, able to carry on all pre-disease performance without restriction      General Appearance: Alert, cooperative, no distress, appears stated age  Head:  Normocephalic, without obvious abnormality, atraumatic  Neck: Supple, symmetrical, trachea midline, no adenopathy  Lungs: Clear to auscultation bilaterally, respirations unlabored  Chest Wall: No tenderness or deformity  Heart: Regular rate and rhythm, S1 and S2 normal, no murmur, rub or gallop  Breast Exam: Left partial mastectomy with no evidence of local recurrence and normal right breast (previous, deferred today)  Abdomen: Soft, non-tender, no masses, no organomegaly  Extremities: Extremities normal, atraumatic, no cyanosis or edema  Skin:  Skin color, texture, turgor normal, no rashes or lesions  Lymph Nodes:  Cervical, supraclavicular, and axillary nodes normal       Labs Results:  Reviewed in epic today  Lab Results   Component Value Date    WBC 7.03 06/16/2024    HGB 13.6 06/16/2024    HCT 43.7 06/16/2024    MCV 95.6 06/16/2024    PLT 309 06/16/2024     Lab Results   Component Value Date    CREAT 0.8 06/16/2024    BUN 18 06/16/2024    NA 142 06/16/2024    K 4.6 06/16/2024    CL 105 06/16/2024    CO2 26 06/16/2024     Lab Results  Component Value Date    ALT 19 06/16/2024    AST 22 06/16/2024    ALKPHOS 72 06/16/2024    BILITOTAL 0.3 06/16/2024       Imaging Results:   Reviewed:   06/12/24 CT CAP  IMPRESSION:   1. No definite metastatic disease in the chest, abdomen, or pelvis.  2. No findings in the right rib cage or right upper quadrant abdomen to explain the patient's pain.  3.  Moderate left colon diverticulosis, without acute diverticulitis.  4. Remainder as above.    06/12/24 Bone Scan  IMPRESSION:   1. Focus of subtle increased uptake left manubrium nonspecific and may be reactive or related to adjacent sternal clavicular joint. Focus of mildly increased uptake left lateral ninth rib nonspecific and can be due to occult trauma. Attention on follow-up.  2. Otherwise no evidence for osseous metastatic disease.  3. Degenerative uptake at locations indicated above.  4. Diffuse tracer activity in stomach as above.    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 Initially well-tolerated, she developed significant side effects after a few weeks, including hot flashes, bone aches, and morning palpitations. She discontinued letrozole  two to three weeks ago, resulting in resolution of palpitations and improvement in bone aches.     Stopped letrozole  -- switched to exemestane      To see her cardiologist for w/u palpitations then start exemestane      She took exemestane  x 3 weeks and then stopped because of significant side effects from her current medication regimen, including mood changes, hot flushes, aching and generalized malaise. Off exemestane  for almost 3 months.     She is concerned about side effects of tamoxifen so will not consider.     Trial anastrozole  QOD x 2 months. Tolerating fairly well, some arthralgias.  Agrees to increase to daily dosing.    Discussed research regarding the use of vitamin B12 2500 mcg of sublingual daily for AI arthralgias.  Also discussed magnesium glycinate  QHS.  She plans initiate OTC vitamin B12 as discussed.  We also discussed lifestyle interventions including dietary modification as well as increasing weightbearing activity and exercise.  Her questions were answered to her satisfaction today.      06/12/24 CT CAP and BS without clear evidence of metastatic disease, to repeat scans in 3 months per Dr. Margaretha, scans scheduled 09/25/24.     RTC 2 m with Dr. Margaretha      Osteopenia   Baseline DXA 09/27/2023 showed osteopenia with lowest T score -1.5 hip and fem neck     10-Year Fracture Risk(1):  -----------------------------------------------------------------  Major Osteoporotic Fracture            3.6%  Hip Fracture                           0.6%  -----------------------------------------------------------------    Continue D3 and calcium  rich food   Recommended WBE     Repeat DXA q 2 years     Dorothyann GORMAN Deters,  NP      Level 4 f/u  Breast cancer management  Long term medication management  Risk for recurrence  endocrine therapy counseling including risks / benefits / side effects / bone health and monitoring of bone health and bone density.             [1]   Past Medical History:  Diagnosis Date    Abnormal vision     glasses    Anxiety     Not on meds    Arthritis     bilateral knees/OA hips/back    Carpal tunnel syndrome on both sides     stiffness noted     Chronic obstructive pulmonary disease (CMS/HCC)     Uses inhalers as prescribed    Difficulty walking     limps    Eczema     has prescription cream    Fracture     finger. resolved, as a child    Gastroesophageal reflux disease     heartburn recently diet related/some problem swallowing    History of gonorrhea     teenager    Hx of Hepatitis C     s/p treatment    Hyperlipidemia     Hypertensive disorder     stable meds    Lower back pain     sciatica radiates down LLE: Pain range= 0-10    Malignant neoplasm of left female breast, unspecified estrogen receptor status, unspecified site of breast (CMS/HCC)      Pre-op dx    Neck pain     related to lower back but significant at this time     Neuropathy     numbness upon awakening from low back radiating down LLE .SABRA. 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 fixed surgically   [2]   Past Surgical History:  Procedure Laterality Date    ARTHROPLASTY, KNEE, TOTAL Left 12/14/2015    Procedure: ARTHROPLASTY, KNEE, TOTAL;  Surgeon: Fermin Oneil SAUNDERS, MD;  Location: [place] [facility];  Service: Orthopedics;  Laterality: Left;  LEFT TOTAL KNEE ARTHROPLASTY    BIOPSY, BREAST, SENTINEL NODE Left 08/06/2023    Procedure: BIOPSY, BREAST, SENTINEL NODE;  Surgeon: Roselind Garner, MD;  Location: [facility];  Service: General;  Laterality: Left;    BREAST, LUMPECTOMY Left 08/06/2023    Procedure: LEFT BREAST LUMPECTOMY; LEFT BREAST WIRE LOC; LEFT BREAST 2ND LESION WIRE LOC; LEFT BREAST SENTINEL LYMPH NODE AND  EXCISION AND MAPPING IN OR BY MD;  Surgeon: Roselind Garner, MD;  Location: [facility];  Service: General;  Laterality: Left;    COLONOSCOPY, DIAGNOSTIC (SCREENING)  2016    X1 polyp    ENDOSCOPIC CARPAL TUNNEL RELEASE Right 03/02/2017    Procedure: ENDOSCOPIC CARPAL TUNNEL RELEASE;  Surgeon: Glendia Gilmore Nancyann Mickey., MD;  Location: [place] [facility];  Service: Orthopedics;  Laterality: Right;    GANGLION CYST EXCISION Right 2007    x2    INDUCED ABORTION      KNEE ARTHROSCOPY Right 09/24/2014    KNEE ARTHROSCOPY Left 03/2015    LYMPHANGIOGRAM/LYMPHANGIOGRAPHY Left 08/06/2023    Procedure: LYMPHANGIOGRAM/LYMPHANGIOGRAPHY;  Surgeon: Roselind Garner, MD;  Location: [facility];  Service: General;  Laterality: Left;    MAMMOPLASTY, REDUCTION,  BREAST HYPERTROPHY (MEDICAL) Left 08/06/2023    Procedure: MAMMOPLASTY, REDUCTION, BREAST HYPERTROPHY (MEDICAL);  Surgeon: Lindley Oneil CROME, MD;  Location: [facility];   Service: Plastics;  Laterality: Left;    PLACEMENT, PERCUTANEOUS, DEVICE, BREAST LOCALIZATION WITH ULTRASOUND GUIDANCE Left 08/06/2023    Procedure: PLACEMENT, PERCUTANEOUS, DEVICE, BREAST LOCALIZATION WITH ULTRASOUND GUIDANCE;  Surgeon: Roselind Garner, MD;  Location: [facility];  Service: General;  Laterality: Left;    REPAIR, UPPER EXTREMITY TENDON Right 03/02/2017    Procedure: REPAIR, UPPER EXTREMITY TENDON, EXCISION, GANGLION CYST;  Surgeon: Glendia Gilmore Nancyann Mickey., MD;  Location: [place] [facility];  Service: Orthopedics;  Laterality: Right;  right wrist flexor carpi radialis tenosynovectomy w/ possible carpal tunnel release and soft tissue mass excision  q1-unk, asst=n, equip=needle tip bovie, beaver blade, lead hand, micro aire, md req 90 min/ok per JD    REVISION, SCAR LEVEL 1 (MEDICAL) Left 08/06/2023    Procedure: REVISION, SCAR LEVEL1 (MEDICAL);  Surgeon: Lindley Oneil CROME, MD;  Location: [facility];  Service: Plastics;  Laterality: Left;  left axilla closure    TONSILLECTOMY      TRANSFER/REARRANGEMENT, TISSUE, ADJACENT, TRUNK Left 08/06/2023    Procedure: TRANSFER/REARRANGEMENT, TISSUE, ADJACENT, TRUNK/BREAST;  Surgeon: Lindley Oneil CROME, MD;  Location: [facility];  Service: Plastics;  Laterality: Left;    VAGINAL DELIVERY      X2 spinals ( 6 natural deliveries)   [3]   Allergies  Allergen Reactions    Latex Rash

## 2024-08-07 ENCOUNTER — Ambulatory Visit: Attending: Internal Medicine | Admitting: Nurse Practitioner

## 2024-08-07 ENCOUNTER — Telehealth: Payer: Self-pay | Admitting: Nurse Practitioner

## 2024-08-07 ENCOUNTER — Encounter: Payer: Self-pay | Admitting: Nurse Practitioner

## 2024-08-07 VITALS — BP 119/83 | HR 101 | Temp 98.3°F | Resp 18 | Ht 60.0 in | Wt 198.0 lb

## 2024-08-07 DIAGNOSIS — M170 Bilateral primary osteoarthritis of knee: Secondary | ICD-10-CM | POA: Insufficient documentation

## 2024-08-07 DIAGNOSIS — Z170 Estrogen receptor positive status [ER+]: Secondary | ICD-10-CM | POA: Insufficient documentation

## 2024-08-07 DIAGNOSIS — R0789 Other chest pain: Secondary | ICD-10-CM | POA: Insufficient documentation

## 2024-08-07 DIAGNOSIS — Z09 Encounter for follow-up examination after completed treatment for conditions other than malignant neoplasm: Secondary | ICD-10-CM | POA: Insufficient documentation

## 2024-08-07 DIAGNOSIS — C50912 Malignant neoplasm of unspecified site of left female breast: Secondary | ICD-10-CM | POA: Insufficient documentation

## 2024-08-07 DIAGNOSIS — R1011 Right upper quadrant pain: Secondary | ICD-10-CM | POA: Insufficient documentation

## 2024-08-07 DIAGNOSIS — Z79811 Long term (current) use of aromatase inhibitors: Secondary | ICD-10-CM | POA: Insufficient documentation

## 2024-08-07 DIAGNOSIS — C50412 Malignant neoplasm of upper-outer quadrant of left female breast: Secondary | ICD-10-CM | POA: Insufficient documentation

## 2024-08-07 DIAGNOSIS — Z7189 Other specified counseling: Secondary | ICD-10-CM | POA: Insufficient documentation

## 2024-08-07 NOTE — Telephone Encounter (Signed)
 Scheduled for scans 09/25/24  Follow up with Dr. Margaretha for scan review (8 weeks)

## 2024-08-11 NOTE — Progress Notes (Unsigned)
 Subjective:       Patient ID: [name] is a 68 y.o. female.    HPI  Patient is here for follow up for her history of left breast cancer. She is on Aromasin .    Today she reports  Skin lesion at last visit?    She reports not being able to afford a diagnostic mammogram as it costs $150.00. We discussed undergoing a screening mammogram now and diagnostic mammogram as needed.     She reports no change in her medical history and family history since her last visit.     Her sister had breast cancer at age 76.    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 No additional  suspicious enhancement.  2. No MRI evidence for malignancy in the contralateral right breast.  3. No axillary lymphadenopathy.    Treatment Team  Ref Prov:  Primary Care Physician:   Hegde, Dinraj, MD Radiology facility: [facility])--[phone] Breast Surgeon : Priscilla Boards MD  [phone]   Plastic Surgeon Oneil Gust MD [phone] Radiation Onc:  Lauraine Jock, MD Medical Onc:  Jon Ober, MD      Breast Cancer Comprehensive Care Plan Summary  Date of diagnosis: 06/06/23 Event : Primary Breast Cancer ECOG Performance: Grade 0  (Fully active) Genetic Testing :21-gene BRCANext-Expanded with RNAinsight     Presentation : abnormal mammogram with mass Side: left Focality :  unifocal Location: radian 2:30   Biologic Tumor characteristics  Tumor: Invasive  Mammary Carcinoma with ductal and lobular Grade: nuclear grade II  Ki-67:  28 % Oncotype Dx:  Score: 21   Estrogen Receptor: positive >90 % Progesterone Receptor: positive 70 % Her-2-neu Receptor: 0 , negative    Staging  Tumor Size:   0.8 cm Nodes: 0 sentinel nodes out of 2 Systemic Metastases: clinically negative Stage:  Stage IA, T1 N0 M0   Treatment  Modality Date    Surgery   08/06/23 left, partial mastectomy, sentinel node biopsy, and oncoplastic reconstruction/mammoplasty   Radiation completion date:  11/19/2023 Adjuvant radiation   Endocrine Start date: 11/2023  Anticipated duration:  5-10 years   Aromasin - Stopped April 2025 due to side effects.    Chemotherapy Completion date: not indicated     Biologic  not indicated   Clinical trial     04/03/2024. BDM-  Suspicious mass at the 12:30 left breast. Ultrasound-guided biopsy is  recommended. No evidence of malignancy within the right breast. BIRADS4  04/10/2024. Left ultrasound guided biopsy- Left Breast 12:30 6 cm FN. Histiocytes, multinucleated giant cells,  hemosiderin, and dense stromal fibrosis/scar-like fibrosis, see comments.   No refractile material is identified. CKAE1/AE3 to confirm the absence of  carcinoma will be performed; results will follow in an addendum. The  findings are consistent with prior biopsy/procedure induced changes.   These results are concordant. Recommendations:  Return to annual screening mammography is recommended    The following portions of the patient's history were reviewed and updated as appropriate:  allergies, current medications, past family history, past medical history, past social history, past surgical history, and problem list.    Review of Systems   Constitutional: Negative.    HEENT:  Negative.     Respiratory: Negative.     Cardiovascular: Negative.    Gastrointestinal: Negative.    Endocrine: Negative.    GU/GYN: Negative.     Musculoskeletal: Negative.    Skin: Negative.    Neurological: Negative.    Hematological:  Negative.    Psychiatric/Behavioral: Negative.     All other systems reviewed and are negative.        Objective:    Physical Exam  Pulmonary:      Effort: Pulmonary effort is normal.   Chest:   Breasts:     Right: No inverted nipple, mass, nipple discharge, skin change or tenderness.      Left: Skin change present. No inverted nipple, mass, nipple discharge or tenderness.          Comments: Multiple dark brown papules in the UOQ of the left breast resembling pimples.   Musculoskeletal:         General: Normal range of motion.      Cervical back: Normal range of motion.   Lymphadenopathy:      Upper Body:      Right upper body: No supraclavicular adenopathy.      Left upper body: No supraclavicular adenopathy.   Skin:     General: Skin is warm and dry.   Neurological:      Mental Status: She is alert and oriented to person, place, and time.   Psychiatric:         Behavior: Behavior normal.       Assessment:       Left 2:00 Cancer  Left 230 benign  New skin lesions (likely folliculitis if the breast)      Plan:      Procedures  Annual mammogram July 2026.   Follow up with medical oncology.   Follow up in 12 months for clinical exam.  Call the office with questions and concerns.

## 2024-08-21 ENCOUNTER — Encounter: Payer: Self-pay | Admitting: Registered Nurse

## 2024-08-25 ENCOUNTER — Ambulatory Visit: Admitting: Registered Nurse

## 2024-08-25 DIAGNOSIS — Z853 Personal history of malignant neoplasm of breast: Secondary | ICD-10-CM

## 2024-09-13 ENCOUNTER — Other Ambulatory Visit: Payer: Self-pay | Admitting: Internal Medicine

## 2024-09-13 DIAGNOSIS — R6889 Other general symptoms and signs: Secondary | ICD-10-CM

## 2024-09-13 HISTORY — DX: Other general symptoms and signs: R68.89

## 2024-09-19 ENCOUNTER — Ambulatory Visit

## 2024-09-23 ENCOUNTER — Ambulatory Visit (INDEPENDENT_AMBULATORY_CARE_PROVIDER_SITE_OTHER)

## 2024-09-23 ENCOUNTER — Encounter (INDEPENDENT_AMBULATORY_CARE_PROVIDER_SITE_OTHER): Payer: Self-pay

## 2024-09-23 NOTE — Progress Notes (Signed)
 Preop pulmonary visit with stratification note, and PFT 07/31/24 media

## 2024-09-23 NOTE — PSS Phone Screening (Addendum)
 Documentation already in chart:  Labs:06/16/24-TSH, Lipid, A1C, CMP, CBCD, VitD  Card Proc: EKG 33/30/24 & ECHO 06/20/22  PCP LOV 06/16/24  Oncology LOV 08/07/24  Notified Dr Kyra office spoke to Yoder who will contact Joceyln~Dr Tan's Nurse~of pt's recent onset of flu-like symptoms    Pre-Anesthesia Evaluation    Pre-op phone visit requested by:   Reason for pre-op phone visit: Patient anticipating COLONOSCOPY, SCREENING procedure.    Language Assistant  Interpreter: N/A - English is preferred language    No orders of the defined types were placed in this encounter.      History of Present Illness/Summary:      Problem List:  Medical Problems       Hospital Problem List  Date Reviewed: 08/21/2024   None        Non-Hospital Problem List  Date Reviewed: 08/21/2024          ICD-10-CM Priority Class Noted Diagnosed    Neck pain M54.2   07/28/2014     Low back pain M54.50   07/28/2014     Essential hypertension (Chronic) I10   12/29/2014     Primary osteoarthritis of both knees M17.0   03/09/2015     Anxiety (Chronic) F41.9   03/09/2015     Left knee DJD M17.12   12/14/2015     Lumbar disc disease M51.9   11/21/2017     Family history of breast cancer Z80.3   01/01/2018     Overview Signed 01/01/2018 11:04 AM by Bruna Vidal FALCON, MD   Sister           Smoker (Chronic) F17.200   01/07/2018     Chronic insomnia F51.04   01/07/2018     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   01/10/2018     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   07/01/2019     GAD (generalized anxiety disorder) F41.1   07/01/2019     Diverticular disease of colon K57.30   12/31/2020     History of hepatitis C Z86.19   12/31/2020     COPD exacerbation (CMS/HCC) J44.1   04/07/2022     Tachycardia R00.0   04/28/2022     BMI 37.0-37.9, adult Z68.37   04/28/2022     Snores R06.83   04/28/2022     Daytime hypersomnolence G47.19   04/28/2022     Severe obesity (CMS/HCC) E66.01   04/28/2022     Abnormal liver function tests R79.89   05/02/2023     Constipation K59.00   05/02/2023     Diverticulosis K57.90    05/02/2023     Lesion of liver K76.9   05/02/2023     Malignant neoplasm of left breast in female, estrogen receptor positive (CMS/HCC) C50.912, Z17.0   09/20/2023     Osteopenia of multiple sites M85.89   05/29/2024         Medical History   Diagnosis Date    Abnormal vision     glasses    Anxiety     Not on meds    Arthritis     bilateral knees/OA hips/back    Carpal tunnel syndrome on both sides     stiffness noted     Chronic obstructive pulmonary disease (CMS/HCC)     Uses inhalers as prescribed    Difficulty walking     limps    Eczema     has prescription cream    Flu-like symptoms 09/13/2024    fever stopped 09/19/24 has  presently not seeked medical help 09/23/24    Fracture     finger. resolved, as a child    Gastroesophageal reflux disease     heartburn recently diet related/some problem swallowing    History of gonorrhea     teenager    Hx of Hepatitis C     s/p treatment    Hyperlipidemia     taking statin's    Hypertensive disorder     stable meds    Lower back pain     sciatica radiates down LLE: Pain range= 0-10    Malignant neoplasm of left female breast, unspecified estrogen receptor status, unspecified site of breast (CMS/HCC) 08/06/2023    lumpectomy    Neuropathy     numbness upon awakening from low back radiating down LLE .SABRA. 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 fixed surgically     Past Surgical History[1]     Medication List            Accurate as of September 23, 2024  9:50 AM. Always use your most recent med list.                anastrozole  1 MG tablet  TAKE 1 TABLET (1 MG) BY MOUTH ONCE DAILY TAKE EVERY OTHER DAY X 2 MONTHS THEN INCREASE TO DAILY  Commonly known as: ARIMIDEX   Medication Adjustments for Surgery: Take morning of surgery     betamethasone  dipropionate 0.05 % cream  Apply 1 .application topically as needed  Commonly known as: DEL-BETA  Medication  Adjustments for Surgery: Hold day of surgery     ELDERBERRY PO  Take 3,200 mg by mouth  Medication Adjustments for Surgery: Stop now     losartan -hydrochlorothiazide  100-12.5 MG per tablet  TAKE 1 TABLET BY MOUTH EVERY DAY  Commonly known as: HYZAAR  Medication Adjustments for Surgery: Take morning of surgery     Magnesium 250 MG Tabs  Take 1 tablet (250 mg) by mouth  Medication Adjustments for Surgery: Hold day of surgery     rosuvastatin  20 MG tablet  Take 1 tablet (20 mg) by mouth once daily  Commonly known as: CRESTOR   Medication Adjustments for Surgery: Take as prescribed     Trelegy Ellipta  100-62.5-25 MCG/ACT Aepb  Inhale 1 puff into the lungs daily  Generic drug: fluticasone-umeclidinium-vilanterol  Medication Adjustments for Surgery: Take morning of surgery     UNABLE TO FIND  Take 1 tablet by mouth once every morning Name: Vitamin super C, vitamin D3 and Zinc  Medication Adjustments for Surgery: Hold day of surgery     vitamin D  25 MCG (1000 UT) tablet  Take 1 tablet (1,000 Units) by mouth once every morning  Commonly known as: CHOLECALCIFEROL   Medication Adjustments for Surgery: Hold day of surgery     Vitamin E 670 MG (1000 UT) Caps  1 capsule  Medication Adjustments for Surgery: Stop now            Allergies[2]  Family History[3]  Social History     Occupational History    Not on file   Tobacco Use    Smoking status: Some Days     Current packs/day: 0.25     Average packs/day: 0.3 packs/day for 30.0 years (7.5 ttl pk-yrs)  Types: Cigarettes    Smokeless tobacco: Never    Tobacco comments:     a pack lasts several days   Vaping Use    Vaping status: Never Used   Substance and Sexual Activity    Alcohol use: Never    Drug use: Yes     Comment: Marijuana    Sexual activity: Not Currently       Menstrual History:   LMP / Status  Postmenopausal     No LMP recorded. Patient is postmenopausal.    Tubal Ligation?  No valid surgical or medical questions entered.               Exam Scores:   SDB score       Previous Sleep Study Results: Severe OSA    STBUR score       PONV score  Nausea Risk: LOW RISK    MST score  MST Score: 0    PEN-FAST score       Frailty score  CFS Score: 4    CHADsVasc            Visit Vitals  Ht 1.524 m (5')   Wt 88.5 kg (195 lb)   BMI 38.08 kg/m   Obese based on BMI.    Recent Labs   CBC (last 180 days) 06/16/24  0854   WBC 7.03   RBC 4.57   Hemoglobin 13.6   Hematocrit 43.7   MCV 95.6   MCH 29.8   MCHC 31.1*   RDW 14   Platelet Count 309   MPV 11.4   nRBC % 0.0   Absolute nRBC 0.00     Recent Labs   BMP (last 180 days) 06/16/24  0854   Glucose 94   BUN 18   Creatinine 0.8   Sodium 142   Potassium 4.6   Chloride 105   CO2 26   Calcium  9.7   Anion Gap 11.0   GFR >60.0         Recent Labs   Other (last 180 days) 06/02/24  1348 06/16/24  0854   TSH  --  1.57   Bilirubin, Total  --  0.3   ALT  --  19   AST (SGOT)  --  22   Protein, Total  --  7.1   Hemoglobin A1C  --  5.8*   SARS-CoV-2 (COVID-19) RNA Not Detected  --                         [1]   Past Surgical History:  Procedure Laterality Date    ARTHROPLASTY, KNEE, TOTAL Left 12/14/2015    Procedure: ARTHROPLASTY, KNEE, TOTAL;  Surgeon: Fermin Oneil SAUNDERS, MD;  Location: [facility];  Service: Orthopedics;  Laterality: Left;  LEFT TOTAL KNEE ARTHROPLASTY    BIOPSY, BREAST, SENTINEL NODE Left 08/06/2023    Procedure: BIOPSY, BREAST, SENTINEL NODE;  Surgeon: Roselind Garner, MD;  Location: [facility];  Service: General;  Laterality: Left;    BREAST, LUMPECTOMY Left 08/06/2023    Procedure: LEFT BREAST LUMPECTOMY; LEFT BREAST WIRE LOC; LEFT BREAST 2ND LESION WIRE LOC; LEFT BREAST SENTINEL LYMPH NODE AND  EXCISION AND MAPPING IN OR BY MD;  Surgeon: Roselind Garner, MD;  Location: [facility];  Service: General;  Laterality: Left;    COLONOSCOPY, DIAGNOSTIC (SCREENING)  2016    X1 polyp    ENDOSCOPIC CARPAL TUNNEL RELEASE Right 03/02/2017  Procedure: ENDOSCOPIC CARPAL TUNNEL RELEASE;  Surgeon: Glendia Gilmore Nancyann Mickey., MD;  Location: [facility];  Service: Orthopedics;  Laterality: Right;    GANGLION CYST EXCISION Right 2007    x2    INDUCED ABORTION      KNEE ARTHROSCOPY Right 09/24/2014    KNEE ARTHROSCOPY Left 03/2015    LYMPHANGIOGRAM/LYMPHANGIOGRAPHY Left 08/06/2023    Procedure: LYMPHANGIOGRAM/LYMPHANGIOGRAPHY;  Surgeon: Roselind Garner, MD;  Location: [facility];  Service: General;  Laterality: Left;    MAMMOPLASTY, REDUCTION, BREAST HYPERTROPHY (MEDICAL) Left 08/06/2023    Procedure: MAMMOPLASTY, REDUCTION, BREAST HYPERTROPHY (MEDICAL);  Surgeon: Lindley Oneil CROME, MD;  Location: [facility];  Service: Plastics;  Laterality: Left;    PLACEMENT, PERCUTANEOUS, DEVICE, BREAST LOCALIZATION WITH ULTRASOUND GUIDANCE Left 08/06/2023    Procedure: PLACEMENT, PERCUTANEOUS, DEVICE, BREAST LOCALIZATION WITH ULTRASOUND GUIDANCE;  Surgeon: Roselind Garner, MD;  Location: [facility];  Service: General;  Laterality: Left;    REPAIR, UPPER EXTREMITY TENDON Right 03/02/2017    Procedure: REPAIR, UPPER EXTREMITY TENDON, EXCISION, GANGLION CYST;  Surgeon: Glendia Gilmore Nancyann Mickey., MD;  Location: [facility];  Service: Orthopedics;  Laterality: Right;  right wrist flexor carpi radialis tenosynovectomy w/ possible carpal tunnel release and soft tissue mass excision  q1-unk, asst=n, equip=needle tip bovie, beaver blade, lead hand, micro aire, md req 90 min/ok per JD    REVISION, SCAR LEVEL 1 (MEDICAL) Left 08/06/2023    Procedure: REVISION, SCAR LEVEL1 (MEDICAL);  Surgeon: Lindley Oneil CROME, MD;  Location: [facility];  Service: Plastics;  Laterality: Left;  left axilla closure    TONSILLECTOMY      TRANSFER/REARRANGEMENT, TISSUE, ADJACENT, TRUNK Left 08/06/2023    Procedure: TRANSFER/REARRANGEMENT, TISSUE, ADJACENT, TRUNK/BREAST;  Surgeon: Lindley Oneil CROME, MD;  Location: [facility];  Service: Plastics;  Laterality: Left;    VAGINAL DELIVERY      X2 spinals ( 6 natural deliveries)    [2]   Allergies  Allergen Reactions    Latex Rash   [3]   Family History  Problem Relation Name Age of Onset    Hypertension Mother      Stroke Mother      Hypertension Father      Arthritis Father      Breast cancer Sister          Diagnoses in her late 71's, died @62  - metastatic disease    Kidney disease Brother      Heart disease Maternal Grandmother      Prostate cancer Paternal Grandfather      Breast cancer Other          Maternal great-aunt dx and died in her 62's    Breast cancer Other          Maternal first cousin once removed - daughter of great-uant - dx and died in her 64's    Breast cancer Paternal Cousin  37    Breast cancer Paternal Cousin  36

## 2024-09-25 ENCOUNTER — Ambulatory Visit

## 2024-09-25 ENCOUNTER — Inpatient Hospital Stay
Admission: RE | Admit: 2024-09-25 | Discharge: 2024-09-25 | Disposition: A | Discharge status: Home / Self Care | Source: Ambulatory Visit | Attending: Internal Medicine | Admitting: Internal Medicine

## 2024-09-25 DIAGNOSIS — C50912 Malignant neoplasm of unspecified site of left female breast: Secondary | ICD-10-CM | POA: Insufficient documentation

## 2024-09-25 DIAGNOSIS — Z170 Estrogen receptor positive status [ER+]: Secondary | ICD-10-CM | POA: Insufficient documentation

## 2024-09-25 DIAGNOSIS — R0789 Other chest pain: Secondary | ICD-10-CM | POA: Insufficient documentation

## 2024-09-25 DIAGNOSIS — R1011 Right upper quadrant pain: Secondary | ICD-10-CM | POA: Insufficient documentation

## 2024-09-25 DIAGNOSIS — R0781 Pleurodynia: Secondary | ICD-10-CM | POA: Insufficient documentation

## 2024-09-25 MED ORDER — TECHNETIUM TC 99M MEDRONATE IV KIT
25.0000 | PACK | Freq: Once | INTRAVENOUS | Status: AC | PRN
  Administered 2024-09-25: 25 via INTRAVENOUS
  Filled 2024-09-25: qty 25

## 2024-09-29 ENCOUNTER — Ambulatory Visit: Admission: RE | Admit: 2024-09-29 | Attending: Internal Medicine

## 2024-09-30 ENCOUNTER — Ambulatory Visit: Payer: Self-pay | Admitting: Internal Medicine

## 2024-09-30 DIAGNOSIS — Z170 Estrogen receptor positive status [ER+]: Secondary | ICD-10-CM

## 2024-10-01 NOTE — Telephone Encounter (Signed)
 Called & spoke with patient. Informed patient that the results of the bone scan were overall clear but there was a spot on her sternum that Dr Margaretha recommends a follow up MRI on for further evaluation. Patient thanked and verbalized understanding.

## 2024-10-02 ENCOUNTER — Ambulatory Visit: Admitting: Internal Medicine

## 2024-10-08 ENCOUNTER — Ambulatory Visit
Admission: RE | Admit: 2024-10-08 | Discharge: 2024-10-08 | Disposition: A | Discharge status: Home / Self Care | Source: Ambulatory Visit | Attending: Internal Medicine | Admitting: Internal Medicine

## 2024-10-08 DIAGNOSIS — C50912 Malignant neoplasm of unspecified site of left female breast: Secondary | ICD-10-CM | POA: Insufficient documentation

## 2024-10-08 DIAGNOSIS — Z170 Estrogen receptor positive status [ER+]: Secondary | ICD-10-CM | POA: Insufficient documentation

## 2024-10-08 DIAGNOSIS — R0789 Other chest pain: Secondary | ICD-10-CM | POA: Insufficient documentation

## 2024-10-08 LAB — WHOLE BLOOD CREATININE WITH GFR POCT
GFR POCT: >=60 mL/min/1.73 m2 (ref >=60)
Whole Blood Creatinine POCT: 0.5 mg/dL (ref 0.5–1.1)

## 2024-10-08 MED ORDER — IOHEXOL 350 MG/ML IV SOLN
100.0000 mL | Freq: Once | INTRAVENOUS | Status: AC | PRN
  Administered 2024-10-08: 100 mL via INTRAVENOUS

## 2024-10-13 ENCOUNTER — Ambulatory Visit

## 2024-10-14 ENCOUNTER — Other Ambulatory Visit: Payer: Self-pay | Admitting: Internal Medicine

## 2024-10-14 ENCOUNTER — Ambulatory Visit
Admission: RE | Admit: 2024-10-14 | Discharge: 2024-10-14 | Disposition: A | Discharge status: Home / Self Care | Source: Ambulatory Visit | Attending: Internal Medicine | Admitting: Internal Medicine

## 2024-10-14 DIAGNOSIS — C50912 Malignant neoplasm of unspecified site of left female breast: Secondary | ICD-10-CM | POA: Insufficient documentation

## 2024-10-14 DIAGNOSIS — Z170 Estrogen receptor positive status [ER+]: Secondary | ICD-10-CM | POA: Insufficient documentation

## 2024-10-14 MED ORDER — GADOBUTROL 1 MMOL/ML IV SOSY (WRAP)
9.0000 mL | Freq: Once | INTRAVENOUS | Status: AC | PRN
  Administered 2024-10-14: 9 mL via INTRAVENOUS
  Filled 2024-10-14: qty 10

## 2024-10-15 ENCOUNTER — Ambulatory Visit: Payer: Self-pay | Admitting: Internal Medicine

## 2024-10-21 ENCOUNTER — Telehealth: Payer: Self-pay | Admitting: Internal Medicine

## 2024-10-21 ENCOUNTER — Ambulatory Visit: Attending: Internal Medicine | Admitting: Internal Medicine

## 2024-10-21 ENCOUNTER — Encounter: Payer: Self-pay | Admitting: Internal Medicine

## 2024-10-21 VITALS — BP 116/88 | HR 129 | Temp 97.9°F | Resp 18 | Ht 60.0 in | Wt 198.7 lb

## 2024-10-21 DIAGNOSIS — Z08 Encounter for follow-up examination after completed treatment for malignant neoplasm: Secondary | ICD-10-CM | POA: Insufficient documentation

## 2024-10-21 DIAGNOSIS — Z170 Estrogen receptor positive status [ER+]: Secondary | ICD-10-CM | POA: Insufficient documentation

## 2024-10-21 DIAGNOSIS — C50912 Malignant neoplasm of unspecified site of left female breast: Secondary | ICD-10-CM | POA: Insufficient documentation

## 2024-10-21 DIAGNOSIS — Z853 Personal history of malignant neoplasm of breast: Secondary | ICD-10-CM | POA: Insufficient documentation

## 2024-10-21 DIAGNOSIS — C50012 Malignant neoplasm of nipple and areola, left female breast: Secondary | ICD-10-CM | POA: Insufficient documentation

## 2024-10-21 NOTE — Telephone Encounter (Signed)
 Please schedule once order signed by provider:    Bilateral screening mammogram    Schedule Requests  Bilateral screening mammogram 03/2025  RTC 6 m with Dorothyann Deters, NP (August 2026)

## 2024-10-29 ENCOUNTER — Telehealth (INDEPENDENT_AMBULATORY_CARE_PROVIDER_SITE_OTHER)

## 2024-11-05 ENCOUNTER — Encounter: Admission: RE | Payer: Self-pay | Source: Ambulatory Visit

## 2024-11-05 ENCOUNTER — Ambulatory Visit: Admission: RE | Admit: 2024-11-05 | Source: Ambulatory Visit | Admitting: Gastroenterology

## 2024-12-15 ENCOUNTER — Ambulatory Visit (INDEPENDENT_AMBULATORY_CARE_PROVIDER_SITE_OTHER): Admitting: Family Medicine

## 2025-04-17 ENCOUNTER — Ambulatory Visit

## 2025-05-01 ENCOUNTER — Ambulatory Visit: Admitting: Nurse Practitioner
